# Patient Record
Sex: Male | Born: 1960 | Race: White | Hispanic: No | Marital: Married | Smoking: Former smoker
Health system: Southern US, Community
[De-identification: ages and names within clinical notes are randomized; demographics above are authoritative.]

## PROBLEM LIST (undated history)

## (undated) DIAGNOSIS — K08109 Complete loss of teeth, unspecified cause, unspecified class: Secondary | ICD-10-CM

## (undated) DIAGNOSIS — M069 Rheumatoid arthritis, unspecified: Secondary | ICD-10-CM

## (undated) DIAGNOSIS — H18519 Endothelial corneal dystrophy, unspecified eye: Secondary | ICD-10-CM

## (undated) DIAGNOSIS — I10 Essential (primary) hypertension: Secondary | ICD-10-CM

## (undated) DIAGNOSIS — H269 Unspecified cataract: Secondary | ICD-10-CM

## (undated) DIAGNOSIS — Z972 Presence of dental prosthetic device (complete) (partial): Secondary | ICD-10-CM

## (undated) DIAGNOSIS — K219 Gastro-esophageal reflux disease without esophagitis: Secondary | ICD-10-CM

## (undated) DIAGNOSIS — C801 Malignant (primary) neoplasm, unspecified: Secondary | ICD-10-CM

## (undated) DIAGNOSIS — R350 Frequency of micturition: Secondary | ICD-10-CM

---

## 2011-11-19 HISTORY — PX: CORNEAL TRANSPLANT: SHX108

## 2013-02-04 ENCOUNTER — Encounter: Payer: Self-pay | Admitting: Family Medicine

## 2013-02-04 DIAGNOSIS — E785 Hyperlipidemia, unspecified: Secondary | ICD-10-CM | POA: Insufficient documentation

## 2013-02-05 ENCOUNTER — Encounter: Payer: Self-pay | Admitting: Family Medicine

## 2013-02-05 ENCOUNTER — Ambulatory Visit (INDEPENDENT_AMBULATORY_CARE_PROVIDER_SITE_OTHER): Payer: BC Managed Care – PPO | Admitting: Family Medicine

## 2013-02-05 VITALS — BP 136/100 | HR 80 | Ht 71.0 in | Wt 231.5 lb

## 2013-02-05 DIAGNOSIS — E785 Hyperlipidemia, unspecified: Secondary | ICD-10-CM

## 2013-02-05 DIAGNOSIS — I10 Essential (primary) hypertension: Secondary | ICD-10-CM | POA: Insufficient documentation

## 2013-02-05 LAB — BASIC METABOLIC PANEL
BUN: 17 mg/dL (ref 6–23)
CO2: 29 mEq/L (ref 19–32)
Chloride: 106 mEq/L (ref 96–112)
Glucose, Bld: 95 mg/dL (ref 70–99)
Potassium: 4.5 mEq/L (ref 3.5–5.3)

## 2013-02-05 LAB — HEPATIC FUNCTION PANEL
AST: 18 U/L (ref 0–37)
Albumin: 4.2 g/dL (ref 3.5–5.2)
Total Bilirubin: 0.8 mg/dL (ref 0.3–1.2)
Total Protein: 6.9 g/dL (ref 6.0–8.3)

## 2013-02-05 LAB — LIPID PANEL
Cholesterol: 145 mg/dL (ref 0–200)
Total CHOL/HDL Ratio: 4.1 Ratio

## 2013-02-05 MED ORDER — LOSARTAN POTASSIUM 100 MG PO TABS: 100.0000 mg | ORAL_TABLET | Freq: Every day | ORAL | Status: DC

## 2013-02-05 MED ORDER — PRAVASTATIN SODIUM 40 MG PO TABS: 40.0000 mg | ORAL_TABLET | Freq: Every day | ORAL | Status: DC

## 2013-02-05 NOTE — Patient Instructions (Signed)
Take all meds. Please exercise regularly.

## 2013-02-05 NOTE — Progress Notes (Signed)
  Subjective:    Patient ID: FIRAS GUARDADO, male    DOB: 09/16/1961, 52 y.o.   MRN: [id number]  Hypertension This is a chronic problem. The current episode started more than 1 year ago. The problem has been gradually worsening since onset. Associated symptoms include malaise/fatigue. Risk factors for coronary artery disease include dyslipidemia. Past treatments include angiotensin blockers. The current treatment provides mild improvement. Compliance problems include diet and exercise.  There is no history of angina or CVA. There is no history of chronic renal disease.      Review of Systems  Constitutional: Positive for malaise/fatigue.  All other systems reviewed and are negative.       Objective:   Physical Exam  Constitutional: He appears well-developed.  HENT:  Head: Normocephalic.  Eyes: EOM are normal.  Neck: Normal range of motion. Neck supple.  Cardiovascular: Normal rate and regular rhythm.   Pulmonary/Chest: Effort normal and breath sounds normal.  Abdominal: Soft. Bowel sounds are normal.          Assessment & Plan:  Impression #1 hyperlipidemia. Control uncertain. #2 hypertension good control. Plan maintain meds. Diet exercise discussed. Return for physical in 3 months.

## 2013-02-26 ENCOUNTER — Encounter: Payer: Self-pay | Admitting: Family Medicine

## 2013-06-02 ENCOUNTER — Telehealth: Payer: Self-pay | Admitting: Family Medicine

## 2013-06-02 NOTE — Telephone Encounter (Signed)
Please fill out form attached to chart and fax to [phone]

## 2013-06-04 NOTE — Telephone Encounter (Signed)
Please call [phone] when the form is completed and faxed to Employer.  This form has an effect on the [employer] at his work.  Thanks

## 2013-06-18 NOTE — Telephone Encounter (Signed)
Chart still has no form, call pt, need form again, also in march I recommended a PE in three mo.s remind pt of this

## 2013-06-18 NOTE — Telephone Encounter (Addendum)
Chart was sent back on 06/02/13 with form attached

## 2013-06-18 NOTE — Telephone Encounter (Signed)
No form on chart

## 2013-06-21 NOTE — Telephone Encounter (Signed)
Patient states that this has already been done and he has confirmation of this.

## 2013-11-16 ENCOUNTER — Other Ambulatory Visit: Payer: Self-pay | Admitting: *Deleted

## 2013-11-16 MED ORDER — LOSARTAN POTASSIUM 100 MG PO TABS: 100.0000 mg | ORAL_TABLET | Freq: Every day | ORAL | Status: DC

## 2013-11-16 MED ORDER — PRAVASTATIN SODIUM 40 MG PO TABS: 40.0000 mg | ORAL_TABLET | Freq: Every day | ORAL | Status: DC

## 2013-11-17 ENCOUNTER — Other Ambulatory Visit: Payer: Self-pay | Admitting: *Deleted

## 2013-12-08 ENCOUNTER — Ambulatory Visit (INDEPENDENT_AMBULATORY_CARE_PROVIDER_SITE_OTHER): Payer: BC Managed Care – PPO | Admitting: Family Medicine

## 2013-12-08 ENCOUNTER — Encounter: Payer: Self-pay | Admitting: Family Medicine

## 2013-12-08 VITALS — BP 142/96 | Temp 98.2°F | Ht 71.0 in | Wt 234.0 lb

## 2013-12-08 DIAGNOSIS — L02419 Cutaneous abscess of limb, unspecified: Secondary | ICD-10-CM

## 2013-12-08 DIAGNOSIS — L03119 Cellulitis of unspecified part of limb: Principal | ICD-10-CM

## 2013-12-08 MED ORDER — DOXYCYCLINE HYCLATE 100 MG PO TABS: 100.0000 mg | ORAL_TABLET | Freq: Two times a day (BID) | ORAL | Status: DC

## 2013-12-08 NOTE — Progress Notes (Signed)
   Subjective:    Patient ID: Barry Gibson, male    DOB: [birth year], 53 y.o.   MRN: [id number]  HPIBoil on left leg. Started 1 week ago. It is draining.   This is been progressively getting larger or more painful tender. He denies having this before. Denies any fever chills wheezing difficulty breathing or other issues  Review of Systems     Objective:   Physical Exam On on examination he has a large boil on the left need redness tenderness swelling pain discomfort. It does drain little bit of pus there is some crusting  After verbal consent the patient consented to go ahead and have treatment. I used lidocaine 1% and numb and then using #11 blade to make a small cut into it got adequate drainage then placed a small packing. There was already tissue destruction of this area do to the infection. A culture was taken. It went without difficulty.       Assessment & Plan:  #1 abscess on the left knee-I. do not feel it's in the joint. It was numbed with 1% lidocaine. #11 blade used to open it up. Significant amount of pus and blood was obtained. Placed on antibiotics twice daily doxycycline twice a day 10 days warning signs discussed if progressive troubles followup. Culture taken. Packing put in. He will take it out tomorrow she is unable to do so he will followup with [place]. Warning signs were discussed.

## 2013-12-11 LAB — WOUND CULTURE

## 2014-02-11 ENCOUNTER — Ambulatory Visit: Payer: BC Managed Care – PPO | Admitting: Family Medicine

## 2014-02-16 ENCOUNTER — Encounter: Payer: Self-pay | Admitting: Family Medicine

## 2014-02-16 ENCOUNTER — Ambulatory Visit (INDEPENDENT_AMBULATORY_CARE_PROVIDER_SITE_OTHER): Payer: BC Managed Care – PPO | Admitting: Family Medicine

## 2014-02-16 VITALS — BP 128/92 | Ht 71.0 in | Wt 235.0 lb

## 2014-02-16 DIAGNOSIS — Z125 Encounter for screening for malignant neoplasm of prostate: Secondary | ICD-10-CM

## 2014-02-16 DIAGNOSIS — E785 Hyperlipidemia, unspecified: Secondary | ICD-10-CM

## 2014-02-16 DIAGNOSIS — Z79899 Other long term (current) drug therapy: Secondary | ICD-10-CM

## 2014-02-16 LAB — HEPATIC FUNCTION PANEL
ALK PHOS: 68 U/L (ref 39–117)
ALT: 15 U/L (ref 0–53)
AST: 13 U/L (ref 0–37)
Albumin: 3.9 g/dL (ref 3.5–5.2)
BILIRUBIN TOTAL: 0.8 mg/dL (ref 0.2–1.2)
Bilirubin, Direct: 0.2 mg/dL (ref 0.0–0.3)
Indirect Bilirubin: 0.6 mg/dL (ref 0.2–1.2)
TOTAL PROTEIN: 6.6 g/dL (ref 6.0–8.3)

## 2014-02-16 LAB — BASIC METABOLIC PANEL
BUN: 17 mg/dL (ref 6–23)
CO2: 27 mEq/L (ref 19–32)
CREATININE: 1.04 mg/dL (ref 0.50–1.35)
Calcium: 8.9 mg/dL (ref 8.4–10.5)
Chloride: 104 mEq/L (ref 96–112)
Glucose, Bld: 95 mg/dL (ref 70–99)
Potassium: 4 mEq/L (ref 3.5–5.3)
Sodium: 140 mEq/L (ref 135–145)

## 2014-02-16 LAB — LIPID PANEL
Cholesterol: 149 mg/dL (ref 0–200)
HDL: 36 mg/dL — AB (ref >39)
LDL Cholesterol: 96 mg/dL (ref 0–99)
TRIGLYCERIDES: 84 mg/dL (ref <150)
Total CHOL/HDL Ratio: 4.1 Ratio
VLDL: 17 mg/dL (ref 0–40)

## 2014-02-16 MED ORDER — LOSARTAN POTASSIUM 100 MG PO TABS: 100.0000 mg | ORAL_TABLET | Freq: Every day | ORAL | Status: DC

## 2014-02-16 MED ORDER — PRAVASTATIN SODIUM 40 MG PO TABS: 40.0000 mg | ORAL_TABLET | Freq: Every day | ORAL | Status: DC

## 2014-02-16 NOTE — Progress Notes (Signed)
   Subjective:    Patient ID: Barry Gibson, male    DOB: [birth year], 53 y.o.   MRN: [id number]  HPIMed check up.Patient states no concerns today.   A lot of walking at work  Patient claims compliance with medications.  No obvious side effects from the medicine.  Mostly watch his diet though he knows there is room for improvement.   Review of Systems No headache no chest pain back pain no abdominal pain no change in bowel habits ROS otherwise negative    Objective:   Physical Exam Alert no apparent distress vitals stable. Lungs clear. Heart regular in rhythm.       Assessment & Plan:  Impression 1 hypertension good control. #2 hyperlipidemia status uncertain. Plan diet exercise discussed maintain same medications. Walking is exam encourage. WSL

## 2014-02-17 LAB — PSA: PSA: 0.68 ng/mL (ref <=4.00)

## 2014-02-27 ENCOUNTER — Encounter: Payer: Self-pay | Admitting: Family Medicine

## 2014-05-17 ENCOUNTER — Telehealth: Payer: Self-pay | Admitting: Family Medicine

## 2014-05-17 MED ORDER — LOSARTAN POTASSIUM 100 MG PO TABS
100.0000 mg | ORAL_TABLET | Freq: Every day | ORAL | Status: DC
Start: 2014-05-17 — End: 2014-08-17

## 2014-05-17 MED ORDER — PRAVASTATIN SODIUM 40 MG PO TABS: 40.0000 mg | ORAL_TABLET | Freq: Every day | ORAL | Status: DC

## 2014-05-17 NOTE — Telephone Encounter (Signed)
Patient needs refills of losartan (COZAAR) 100 MG tablet and pravastatin (PRAVACHOL) 40 MG tablet. Please call patients wife when this is done.  [place]

## 2014-05-17 NOTE — Telephone Encounter (Signed)
Rxs sent electronically to pharmacy. Patient notified. 

## 2014-05-19 ENCOUNTER — Encounter: Payer: BC Managed Care – PPO | Admitting: Family Medicine

## 2014-08-17 ENCOUNTER — Telehealth: Payer: Self-pay | Admitting: Family Medicine

## 2014-08-17 MED ORDER — PRAVASTATIN SODIUM 40 MG PO TABS: 40.0000 mg | ORAL_TABLET | Freq: Every day | ORAL | Status: DC

## 2014-08-17 MED ORDER — LOSARTAN POTASSIUM 100 MG PO TABS: 100.0000 mg | ORAL_TABLET | Freq: Every day | ORAL | Status: DC

## 2014-08-17 NOTE — Telephone Encounter (Signed)
Patients wife said that in the past we have called patients losartan (COZAAR) 100 MG tablet and pravastatin (PRAVACHOL) 40 MG tablet in for a year supply. She wants to know why we did not do this for him the last time he was in the office, because he currently has no more refills?  [place]

## 2014-08-17 NOTE — Telephone Encounter (Signed)
Notified wife that at patient's last office visit in April, the doctor wanted patient to return in 3 months (July 2015) for a wellness exam and he never did. Wife stated that she will have patient call and schedule wellness exam asap. Sent in 90 day supply of meds to mail order pharmacy based on appt being made.

## 2014-11-24 ENCOUNTER — Telehealth: Payer: Self-pay | Admitting: Family Medicine

## 2014-11-24 DIAGNOSIS — Z125 Encounter for screening for malignant neoplasm of prostate: Secondary | ICD-10-CM

## 2014-11-24 DIAGNOSIS — Z79899 Other long term (current) drug therapy: Secondary | ICD-10-CM

## 2014-11-24 DIAGNOSIS — I10 Essential (primary) hypertension: Secondary | ICD-10-CM

## 2014-11-24 DIAGNOSIS — E785 Hyperlipidemia, unspecified: Secondary | ICD-10-CM

## 2014-11-24 MED ORDER — LOSARTAN POTASSIUM 100 MG PO TABS: 100.0000 mg | ORAL_TABLET | Freq: Every day | ORAL | Status: DC

## 2014-11-24 MED ORDER — PRAVASTATIN SODIUM 40 MG PO TABS: 40.0000 mg | ORAL_TABLET | Freq: Every day | ORAL | Status: DC

## 2014-11-24 NOTE — Telephone Encounter (Signed)
Pt was advised in April last yr to f u in three mo, waited nine mo, met 7 lip liv psa. We won't do 90 d before seeing. thierty d if family request of both locally

## 2014-11-24 NOTE — Telephone Encounter (Signed)
Patient's wife notified. I sent in a 30 day supply and ordered BW. She verbalized understanding.

## 2014-11-24 NOTE — Telephone Encounter (Signed)
Patient has a wellness visit on 12/01/2013. Patients spouse is concerned that he will be out of his medication before they are able to get this back from the mail order company [facility] that they use if he waits until this appointment to get his refills.  She says this is her dilemma and she does not know what to do.  Also, is he due for BW for this visit?

## 2014-11-25 LAB — BASIC METABOLIC PANEL
BUN: 20 mg/dL (ref 6–23)
CO2: 27 meq/L (ref 19–32)
Calcium: 8.8 mg/dL (ref 8.4–10.5)
Chloride: 106 mEq/L (ref 96–112)
Creat: 1.05 mg/dL (ref 0.50–1.35)
Glucose, Bld: 91 mg/dL (ref 70–99)
POTASSIUM: 4.4 meq/L (ref 3.5–5.3)
Sodium: 140 mEq/L (ref 135–145)

## 2014-11-25 LAB — LIPID PANEL
CHOL/HDL RATIO: 4.5 ratio
CHOLESTEROL: 148 mg/dL (ref 0–200)
HDL: 33 mg/dL — ABNORMAL LOW (ref >39)
LDL Cholesterol: 101 mg/dL — ABNORMAL HIGH (ref 0–99)
Triglycerides: 70 mg/dL (ref <150)
VLDL: 14 mg/dL (ref 0–40)

## 2014-11-25 LAB — HEPATIC FUNCTION PANEL
ALBUMIN: 3.9 g/dL (ref 3.5–5.2)
ALT: 31 U/L (ref 0–53)
AST: 21 U/L (ref 0–37)
Alkaline Phosphatase: 63 U/L (ref 39–117)
BILIRUBIN DIRECT: 0.1 mg/dL (ref 0.0–0.3)
BILIRUBIN TOTAL: 0.6 mg/dL (ref 0.2–1.2)
Indirect Bilirubin: 0.5 mg/dL (ref 0.2–1.2)
Total Protein: 6.6 g/dL (ref 6.0–8.3)

## 2014-11-26 LAB — PSA: PSA: 0.53 ng/mL (ref <=4.00)

## 2014-11-28 DIAGNOSIS — I10 Essential (primary) hypertension: Secondary | ICD-10-CM

## 2014-12-01 ENCOUNTER — Encounter: Payer: Self-pay | Admitting: Family Medicine

## 2014-12-01 ENCOUNTER — Ambulatory Visit (INDEPENDENT_AMBULATORY_CARE_PROVIDER_SITE_OTHER): Payer: BLUE CROSS/BLUE SHIELD | Admitting: Family Medicine

## 2014-12-01 VITALS — BP 150/100 | Temp 97.8°F | Ht 71.0 in | Wt 244.0 lb

## 2014-12-01 DIAGNOSIS — E785 Hyperlipidemia, unspecified: Secondary | ICD-10-CM

## 2014-12-01 DIAGNOSIS — I10 Essential (primary) hypertension: Secondary | ICD-10-CM

## 2014-12-01 DIAGNOSIS — Z0000 Encounter for general adult medical examination without abnormal findings: Secondary | ICD-10-CM

## 2014-12-01 MED ORDER — LOSARTAN POTASSIUM 100 MG PO TABS: 100.0000 mg | ORAL_TABLET | Freq: Every day | ORAL | Status: DC

## 2014-12-01 MED ORDER — PRAVASTATIN SODIUM 40 MG PO TABS: 40.0000 mg | ORAL_TABLET | Freq: Every day | ORAL | Status: DC

## 2014-12-01 NOTE — Progress Notes (Signed)
Subjective:    Patient ID: Barry Gibson, male    DOB: [birth year], 54 y.o.   MRN: [id number]  HPI The patient comes in today for a wellness visit.    A review of their health history was completed.  A review of medications was also completed.  Any needed refills; YES  Eating habits: SAME  Falls/  MVA accidents in past few months:NO Regular exercise:   BP tends to run a bit high usually 130s over 92  Pt has not had colonoscopy yet  Specialist pt sees on regular basis: [facility]RIST  Preventative health issues were discussed.   Additional concerns: NONE  Results for orders placed or performed in visit on 62/95/28  Basic metabolic panel  Result Value Ref Range   Sodium 140 135 - 145 mEq/L   Potassium 4.4 3.5 - 5.3 mEq/L   Chloride 106 96 - 112 mEq/L   CO2 27 19 - 32 mEq/L   Glucose, Bld 91 70 - 99 mg/dL   BUN 20 6 - 23 mg/dL   Creat 1.05 0.50 - 1.35 mg/dL   Calcium 8.8 8.4 - 10.5 mg/dL  Lipid panel  Result Value Ref Range   Cholesterol 148 0 - 200 mg/dL   Triglycerides 70 <150 mg/dL   HDL 33 (L) >39 mg/dL   Total CHOL/HDL Ratio 4.5 Ratio   VLDL 14 0 - 40 mg/dL   LDL Cholesterol 101 (H) 0 - 99 mg/dL  Hepatic function panel  Result Value Ref Range   Total Bilirubin 0.6 0.2 - 1.2 mg/dL   Bilirubin, Direct 0.1 0.0 - 0.3 mg/dL   Indirect Bilirubin 0.5 0.2 - 1.2 mg/dL   Alkaline Phosphatase 63 39 - 117 U/L   AST 21 0 - 37 U/L   ALT 31 0 - 53 U/L   Total Protein 6.6 6.0 - 8.3 g/dL   Albumin 3.9 3.5 - 5.2 g/dL  PSA  Result Value Ref Range   PSA 0.53 <=4.00 ng/mL   Pt was exercising regularly and then developed significant heel pain.  This is compromises exercise Review of Systems  Constitutional: Negative for fever, activity change and appetite change.       Substantial heel pain bilateral followed by specialist  HENT: Negative for congestion and rhinorrhea.   Eyes: Negative for discharge.  Respiratory: Negative for cough and wheezing.   Cardiovascular:  Negative for chest pain.  Gastrointestinal: Negative for vomiting, abdominal pain and blood in stool.  Genitourinary: Negative for frequency and difficulty urinating.  Musculoskeletal: Negative for neck pain.  Skin: Negative for rash.  Allergic/Immunologic: Negative for environmental allergies and food allergies.  Neurological: Negative for weakness and headaches.  Psychiatric/Behavioral: Negative for agitation.  All other systems reviewed and are negative.      Objective:   Physical Exam  Constitutional: He appears well-developed and well-nourished.  Blood pressure good on repeat  HENT:  Head: Normocephalic and atraumatic.  Right Ear: External ear normal.  Left Ear: External ear normal.  Nose: Nose normal.  Mouth/Throat: Oropharynx is clear and moist.  Eyes: EOM are normal. Pupils are equal, round, and reactive to light.  Neck: Normal range of motion. Neck supple. No thyromegaly present.  Cardiovascular: Normal rate, regular rhythm and normal heart sounds.   No murmur heard. Pulmonary/Chest: Effort normal and breath sounds normal. No respiratory distress. He has no wheezes.  Abdominal: Soft. Bowel sounds are normal. He exhibits no distension and no mass. There is no tenderness.  Genitourinary: Prostate normal and  penis normal.  Musculoskeletal: Normal range of motion. He exhibits no edema.  Lymphadenopathy:    He has no cervical adenopathy.  Neurological: He is alert. He exhibits normal muscle tone.  Skin: Skin is warm and dry. No erythema.  Psychiatric: He has a normal mood and affect. His behavior is normal. Judgment normal.  Vitals reviewed.         Assessment & Plan:  Impression 1 wellness exam #2 hypertension good control. #3 hyperlipidemia good control plan medications refilled. Recheck in 6 months. Diet discussed exercise discussed. Strongly encouraged to get colonoscopy and sheet given. WSL

## 2015-06-05 ENCOUNTER — Other Ambulatory Visit: Payer: Self-pay | Admitting: *Deleted

## 2015-06-05 MED ORDER — LOSARTAN POTASSIUM 100 MG PO TABS
100.0000 mg | ORAL_TABLET | Freq: Every day | ORAL | Status: DC
Start: 2015-06-05 — End: 2015-07-29

## 2015-06-05 MED ORDER — PRAVASTATIN SODIUM 40 MG PO TABS: 40.0000 mg | ORAL_TABLET | Freq: Every day | ORAL | Status: DC

## 2015-07-29 ENCOUNTER — Other Ambulatory Visit: Payer: Self-pay | Admitting: Family Medicine

## 2015-09-29 ENCOUNTER — Other Ambulatory Visit: Payer: Self-pay | Admitting: Family Medicine

## 2015-10-02 ENCOUNTER — Telehealth: Payer: Self-pay | Admitting: Family Medicine

## 2015-10-02 NOTE — Telephone Encounter (Signed)
Patient has about 30 days of his losartan and pravastatin left.  His spouse tried to call and make an appointment for him to follow up on his medications but we are booked for the month of November.  She called back after speaking with her husband and she says his physical would be due after December 02, 2015.  She wants to know if Dr. Richardson Landry could send him in additional refills on his medication to get him through until he is due for his physical in January.

## 2015-10-02 NOTE — Telephone Encounter (Signed)
ok 

## 2015-10-02 NOTE — Telephone Encounter (Signed)
May we send in refills until January. Patient last seen January 2016 for physical?

## 2015-10-03 ENCOUNTER — Other Ambulatory Visit: Payer: Self-pay | Admitting: *Deleted

## 2015-10-03 MED ORDER — PRAVASTATIN SODIUM 40 MG PO TABS: ORAL_TABLET | ORAL | Status: DC

## 2015-10-03 MED ORDER — LOSARTAN POTASSIUM 100 MG PO TABS: ORAL_TABLET | ORAL | Status: DC

## 2015-10-03 NOTE — Telephone Encounter (Signed)
Patients spouse called back and wants to make sure that the medication was sent in for a 90 day supply so that they would send the whole 90 day instead of 30 days now, with 2 refills.  She said it is cheaper this way.

## 2015-10-03 NOTE — Telephone Encounter (Signed)
meds sent for 90 days. Pt notified on vm.

## 2015-10-03 NOTE — Telephone Encounter (Signed)
Called patient's spouse and informed her per Dr.Steve Luking refills on the pravastatin, and Losartan were sent into pharmacy with a supply to last until January. Patient's wife verbalized understanding and stated that she will call back and schedule an appointment for the physical.

## 2015-12-04 ENCOUNTER — Ambulatory Visit (INDEPENDENT_AMBULATORY_CARE_PROVIDER_SITE_OTHER): Payer: BLUE CROSS/BLUE SHIELD | Admitting: Family Medicine

## 2015-12-04 ENCOUNTER — Encounter: Payer: Self-pay | Admitting: Family Medicine

## 2015-12-04 VITALS — BP 148/98 | Ht 70.25 in | Wt 242.0 lb

## 2015-12-04 DIAGNOSIS — E785 Hyperlipidemia, unspecified: Secondary | ICD-10-CM | POA: Diagnosis not present

## 2015-12-04 DIAGNOSIS — Z125 Encounter for screening for malignant neoplasm of prostate: Secondary | ICD-10-CM

## 2015-12-04 DIAGNOSIS — Z0000 Encounter for general adult medical examination without abnormal findings: Secondary | ICD-10-CM | POA: Diagnosis not present

## 2015-12-04 DIAGNOSIS — Z79899 Other long term (current) drug therapy: Secondary | ICD-10-CM

## 2015-12-04 DIAGNOSIS — I10 Essential (primary) hypertension: Secondary | ICD-10-CM

## 2015-12-04 MED ORDER — LOSARTAN POTASSIUM 100 MG PO TABS: ORAL_TABLET | ORAL | Status: DC

## 2015-12-04 MED ORDER — PRAVASTATIN SODIUM 40 MG PO TABS: ORAL_TABLET | ORAL | Status: DC

## 2015-12-04 NOTE — Progress Notes (Signed)
Subjective:    Patient ID: Barry Gibson, male    DOB: 1961/09/27, 55 y.o.   MRN: [id number]  HPI The patient comes in today for a wellness visit.  Has not had colonoscopy  A review of their health history was completed.  A review of medications was also completed.  Any needed refills; none  Eating habits: not health conscious  Falls/  MVA accidents in past few months: none  Regular exercise: tries to ride a bike daily, three times per wk mosly, 20 to 25  Specialist pt sees on regular basis: none  Preventative health issues were discussed.   Additional concerns: wants to discuss changing pravastatin. Cost went up. Compliant with cholesterol medication. Meds reviewed today. Does not miss a dose. Watching salt intake. Does not miss a blood pressure no seizure. No obvious side effects.   Ears have been stopped up for the past 2 -3 weeks. Better the past 2 days. , ears started stopping up, cleared up mostly  Results for orders placed or performed in visit on 0000000  Basic metabolic panel  Result Value Ref Range   Sodium 140 135 - 145 mEq/L   Potassium 4.4 3.5 - 5.3 mEq/L   Chloride 106 96 - 112 mEq/L   CO2 27 19 - 32 mEq/L   Glucose, Bld 91 70 - 99 mg/dL   BUN 20 6 - 23 mg/dL   Creat 1.05 0.50 - 1.35 mg/dL   Calcium 8.8 8.4 - 10.5 mg/dL  Lipid panel  Result Value Ref Range   Cholesterol 148 0 - 200 mg/dL   Triglycerides 70 <150 mg/dL   HDL 33 (L) >39 mg/dL   Total CHOL/HDL Ratio 4.5 Ratio   VLDL 14 0 - 40 mg/dL   LDL Cholesterol 101 (H) 0 - 99 mg/dL  Hepatic function panel  Result Value Ref Range   Total Bilirubin 0.6 0.2 - 1.2 mg/dL   Bilirubin, Direct 0.1 0.0 - 0.3 mg/dL   Indirect Bilirubin 0.5 0.2 - 1.2 mg/dL   Alkaline Phosphatase 63 39 - 117 U/L   AST 21 0 - 37 U/L   ALT 31 0 - 53 U/L   Total Protein 6.6 6.0 - 8.3 g/dL   Albumin 3.9 3.5 - 5.2 g/dL  PSA  Result Value Ref Range   PSA 0.53 <=4.00 ng/mL     Review of Systems  Constitutional:  Negative for fever, activity change and appetite change.  HENT: Negative for congestion and rhinorrhea.   Eyes: Negative for discharge.  Respiratory: Negative for cough and wheezing.   Cardiovascular: Negative for chest pain.  Gastrointestinal: Negative for vomiting, abdominal pain and blood in stool.  Genitourinary: Negative for frequency and difficulty urinating.  Musculoskeletal: Negative for neck pain.  Skin: Negative for rash.  Allergic/Immunologic: Negative for environmental allergies and food allergies.  Neurological: Negative for weakness and headaches.  Psychiatric/Behavioral: Negative for agitation.  All other systems reviewed and are negative.      Objective:   Physical Exam  Constitutional: He appears well-developed and well-nourished.  HENT:  Head: Normocephalic and atraumatic.  Right Ear: External ear normal.  Left Ear: External ear normal.  Nose: Nose normal.  Mouth/Throat: Oropharynx is clear and moist.  Eyes: EOM are normal. Pupils are equal, round, and reactive to light.  Neck: Normal range of motion. Neck supple. No thyromegaly present.  Cardiovascular: Normal rate, regular rhythm and normal heart sounds.   No murmur heard. Pulmonary/Chest: Effort normal and breath sounds normal. No  respiratory distress. He has no wheezes.  Abdominal: Soft. Bowel sounds are normal. He exhibits no distension and no mass. There is no tenderness.  Genitourinary: Penis normal.  Musculoskeletal: Normal range of motion. He exhibits no edema.  Lymphadenopathy:    He has no cervical adenopathy.  Neurological: He is alert. He exhibits normal muscle tone.  Skin: Skin is warm and dry. No erythema.  Psychiatric: He has a normal mood and affect. His behavior is normal. Judgment normal.  Vitals reviewed.   Ear exam does reveal substantial wax bilateral      Assessment & Plan:  Impression wellness exam #2 hypertension good control on current meds. Meds reviewed #3 hyperlipidemia  control uncertain needs to do blood work #4 cerumen impaction discussed plan name of ENT given. Colonoscopy sheet given. Encouraged patient to do. Appropriate blood work. Medications refilled. Diet exercise discussed and encouraged WSL

## 2015-12-05 ENCOUNTER — Encounter: Payer: Self-pay | Admitting: Family Medicine

## 2015-12-05 LAB — BASIC METABOLIC PANEL
BUN/Creatinine Ratio: 19 (ref 9–20)
BUN: 20 mg/dL (ref 6–24)
CALCIUM: 9.4 mg/dL (ref 8.7–10.2)
CHLORIDE: 104 mmol/L (ref 96–106)
CO2: 22 mmol/L (ref 18–29)
Creatinine, Ser: 1.04 mg/dL (ref 0.76–1.27)
GFR calc Af Amer: 94 mL/min/{1.73_m2} (ref >59)
GFR calc non Af Amer: 82 mL/min/{1.73_m2} (ref >59)
Glucose: 99 mg/dL (ref 65–99)
POTASSIUM: 5.1 mmol/L (ref 3.5–5.2)
Sodium: 144 mmol/L (ref 134–144)

## 2015-12-05 LAB — PSA: Prostate Specific Ag, Serum: 0.6 ng/mL (ref 0.0–4.0)

## 2015-12-05 LAB — HEPATIC FUNCTION PANEL
ALBUMIN: 4.2 g/dL (ref 3.5–5.5)
ALT: 18 IU/L (ref 0–44)
AST: 18 IU/L (ref 0–40)
Alkaline Phosphatase: 72 IU/L (ref 39–117)
Bilirubin Total: 0.8 mg/dL (ref 0.0–1.2)
Bilirubin, Direct: 0.16 mg/dL (ref 0.00–0.40)
Total Protein: 6.9 g/dL (ref 6.0–8.5)

## 2015-12-05 LAB — LIPID PANEL
CHOL/HDL RATIO: 4.4 ratio (ref 0.0–5.0)
CHOLESTEROL TOTAL: 163 mg/dL (ref 100–199)
HDL: 37 mg/dL — ABNORMAL LOW (ref >39)
LDL CALC: 106 mg/dL — AB (ref 0–99)
TRIGLYCERIDES: 98 mg/dL (ref 0–149)
VLDL CHOLESTEROL CAL: 20 mg/dL (ref 5–40)

## 2015-12-21 ENCOUNTER — Emergency Department (HOSPITAL_COMMUNITY): Payer: Worker's Compensation

## 2015-12-21 ENCOUNTER — Encounter (HOSPITAL_COMMUNITY): Payer: Self-pay | Admitting: Emergency Medicine

## 2015-12-21 ENCOUNTER — Emergency Department (HOSPITAL_COMMUNITY)
Admission: EM | Admit: 2015-12-21 | Discharge: 2015-12-21 | Disposition: A | Discharge status: Home / Self Care | Payer: Worker's Compensation | Attending: Emergency Medicine | Admitting: Emergency Medicine

## 2015-12-21 DIAGNOSIS — I10 Essential (primary) hypertension: Secondary | ICD-10-CM | POA: Insufficient documentation

## 2015-12-21 DIAGNOSIS — Y990 Civilian activity done for income or pay: Secondary | ICD-10-CM | POA: Insufficient documentation

## 2015-12-21 DIAGNOSIS — Z87891 Personal history of nicotine dependence: Secondary | ICD-10-CM | POA: Insufficient documentation

## 2015-12-21 DIAGNOSIS — W1843XA Slipping, tripping and stumbling without falling due to stepping from one level to another, initial encounter: Secondary | ICD-10-CM | POA: Diagnosis not present

## 2015-12-21 DIAGNOSIS — Y9389 Activity, other specified: Secondary | ICD-10-CM | POA: Diagnosis not present

## 2015-12-21 DIAGNOSIS — S8991XA Unspecified injury of right lower leg, initial encounter: Secondary | ICD-10-CM | POA: Diagnosis not present

## 2015-12-21 DIAGNOSIS — Z79899 Other long term (current) drug therapy: Secondary | ICD-10-CM | POA: Diagnosis not present

## 2015-12-21 DIAGNOSIS — Z7982 Long term (current) use of aspirin: Secondary | ICD-10-CM | POA: Insufficient documentation

## 2015-12-21 DIAGNOSIS — M25561 Pain in right knee: Secondary | ICD-10-CM

## 2015-12-21 DIAGNOSIS — Y9289 Other specified places as the place of occurrence of the external cause: Secondary | ICD-10-CM | POA: Diagnosis not present

## 2015-12-21 HISTORY — DX: Essential (primary) hypertension: I10

## 2015-12-21 LAB — RAPID URINE DRUG SCREEN, HOSP PERFORMED
Amphetamines: NOT DETECTED
BARBITURATES: NOT DETECTED
Benzodiazepines: NOT DETECTED
Cocaine: NOT DETECTED
Opiates: NOT DETECTED
TETRAHYDROCANNABINOL: NOT DETECTED

## 2015-12-21 MED ORDER — OXYCODONE-ACETAMINOPHEN 5-325 MG PO TABS
1.0000 | ORAL_TABLET | Freq: Once | ORAL | Status: AC
  Administered 2015-12-21: 1 via ORAL
  Filled 2015-12-21: qty 1

## 2015-12-21 MED ORDER — DICLOFENAC SODIUM 50 MG PO TBEC: 50.0000 mg | DELAYED_RELEASE_TABLET | Freq: Two times a day (BID) | ORAL | Status: DC

## 2015-12-21 MED ORDER — OXYCODONE-ACETAMINOPHEN 5-325 MG PO TABS: 1.0000 | ORAL_TABLET | ORAL | Status: DC | PRN

## 2015-12-21 NOTE — ED Notes (Signed)
Pt and pt's wife requesting urine drug screen because the incident happened at work and he was told by his employer to have drug screen done. Pt was given Percocet approximately 1 hour and 15 mins before urine drug screen was performed. Debroah Baller, NP aware.

## 2015-12-21 NOTE — ED Provider Notes (Signed)
CSN: [id number]     Arrival date & time 12/21/15  0857 History   First MD Initiated Contact with Patient 12/21/15 [phone]     Chief Complaint  Patient presents with  . Knee Injury     (Consider location/radiation/quality/duration/timing/severity/associated sxs/prior Treatment) Patient is a 55 y.o. male presenting with knee pain. The history is provided by the patient.  Knee Pain Location:  Knee Time since incident:  24 hours Knee location:  R knee Pain details:    Quality:  Tearing   Radiates to:  R leg   Severity:  Severe   Onset quality:  Gradual   Timing:  Constant   Progression:  Worsening Dislocation: no   Foreign body present:  No foreign bodies Prior injury to area:  No Relieved by:  Nothing Worsened by:  Bearing weight Ineffective treatments:  NSAIDs Associated symptoms: swelling    LORELL SMERIGLIO is a 55 y.o. male who presents to the ED with right knee pain. 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  He reports difficulty in trying to raise his leg while lying flat due to pain. He reports taking ibuprofen for pain but was still unable to sleepy all night.   Past Medical History  Diagnosis Date  . Hypertension    History reviewed. No pertinent past surgical history. History reviewed. No pertinent family history. Social History  Substance Use Topics  . Smoking status: Former Smoker    Types: Cigarettes  . Smokeless tobacco: None  . Alcohol Use: 0.0 oz/week    Review of Systems  Musculoskeletal: Positive for arthralgias.       Right knee pain and swelling  all other systems negative    Allergies  Ace inhibitors  Home Medications   Prior to Admission medications   Medication Sig Start Date End Date Taking? Authorizing Provider  aspirin 81 MG tablet Take 81 mg by mouth daily.   Yes Historical Provider, MD  ibuprofen (ADVIL,MOTRIN) 200 MG tablet Take 600 mg by mouth every 6  (six) hours as needed.   Yes Historical Provider, MD  losartan (COZAAR) 100 MG tablet Take 1 tablet by mouth  daily 12/04/15  Yes Mikey Kirschner, MD  pravastatin (PRAVACHOL) 40 MG tablet Take 1 tablet by mouth  daily 12/04/15  Yes Mikey Kirschner, MD  prednisoLONE acetate (PRED FORTE) 1 % ophthalmic suspension Place 1 drop into the left eye 3 (three) times a week. Monday, Wednesday, Friday   Yes Historical Provider, MD  diclofenac (VOLTAREN) 50 MG EC tablet Take 1 tablet (50 mg total) by mouth 2 (two) times daily. 12/21/15   Zhavia Cunanan Bunnie Pion, NP  oxyCODONE-acetaminophen (ROXICET) 5-325 MG tablet Take 1-2 tablets by mouth every 4 (four) hours as needed for severe pain. 12/21/15   Dustina Scoggin Bunnie Pion, NP   BP 112/72 mmHg  Pulse 80  Temp(Src) 97.6 F (36.4 C) (Oral)  Resp 18  Ht 5\' 11"  (1.803 m)  Wt 107.956 kg  BMI 33.21 kg/m2  SpO2 99% Physical Exam  Constitutional: He is oriented to person, place, and time. He appears well-developed and well-nourished. No distress.  HENT:  Head: Normocephalic and atraumatic.  Eyes: Conjunctivae and EOM are normal.  Neck: Normal range of motion. Neck supple.  Cardiovascular: Normal rate.   Pulmonary/Chest: Effort normal.  Musculoskeletal:       Right knee: He exhibits swelling. He exhibits normal range of motion, no  deformity and no erythema.       Legs: Pedal pulses 2+, adequate circulation. Pain with straight leg raises on the right. Pain with palpation over the patella. Severe pain with flexion of the knee.   Neurological: He is alert and oriented to person, place, and time. No cranial nerve deficit.  Skin: Skin is warm and dry.  Psychiatric: He has a normal mood and affect. His behavior is normal.  Nursing note and vitals reviewed.   ED Course  Procedures (including critical care time) X-ray, pain management, ice, elevation, urine drug screen for worker's comp. Knee immobilizer, crutches (patient has at home). Referral to ortho.. Patient request to see  [facility].   Labs Review Labs Reviewed  URINE RAPID DRUG SCREEN, HOSP PERFORMED    Imaging Review Dg Knee Complete 4 Views Right  12/21/2015  CLINICAL DATA:  55 year old male status post twisting injury yesterday with severe pain, and swelling. Initial encounter. EXAM: RIGHT KNEE - COMPLETE 4+ VIEW COMPARISON:  None. FINDINGS: No definite joint effusion. Bone mineralization is within normal limits. Joint spaces and alignment within normal limits. Patella intact. No acute osseous abnormality identified. IMPRESSION: No osseous abnormality about the left knee. Electronically Signed   By: Genevie Ann M.D.   On: 12/21/2015 09:49    MDM  55 y.o. male with right knee pain s/p injury at work yesterday stable for d/c without focal neuro deficits. Knee immobilizer, crutches, ice, elevation and pain management. Discussed with the patient and all questioned fully answered. He will retirm if any problems arise.   Final diagnoses:  Knee pain, right anterior       [facility], NP 12/21/15 1107  Milton Ferguson, MD 12/22/15 [phone]

## 2015-12-21 NOTE — Discharge Instructions (Signed)
Call to schedule an appointment with the orthopedic doctor. Tell them it will be Workers Comp. Return here as needed.

## 2015-12-21 NOTE — ED Notes (Signed)
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

## 2015-12-21 NOTE — ED Notes (Signed)
NP at bedside.

## 2016-01-11 ENCOUNTER — Ambulatory Visit (INDEPENDENT_AMBULATORY_CARE_PROVIDER_SITE_OTHER): Payer: BLUE CROSS/BLUE SHIELD | Admitting: Otolaryngology

## 2016-01-11 DIAGNOSIS — H6123 Impacted cerumen, bilateral: Secondary | ICD-10-CM

## 2016-01-11 DIAGNOSIS — H900 Conductive hearing loss, bilateral: Secondary | ICD-10-CM

## 2016-01-25 ENCOUNTER — Ambulatory Visit (INDEPENDENT_AMBULATORY_CARE_PROVIDER_SITE_OTHER): Payer: BLUE CROSS/BLUE SHIELD | Admitting: Otolaryngology

## 2016-05-03 ENCOUNTER — Telehealth: Payer: Self-pay | Admitting: Family Medicine

## 2016-05-03 DIAGNOSIS — E785 Hyperlipidemia, unspecified: Secondary | ICD-10-CM

## 2016-05-03 DIAGNOSIS — Z79899 Other long term (current) drug therapy: Secondary | ICD-10-CM

## 2016-05-03 DIAGNOSIS — Z139 Encounter for screening, unspecified: Secondary | ICD-10-CM

## 2016-05-03 NOTE — Telephone Encounter (Signed)
[facility] 05/03/16

## 2016-05-03 NOTE — Telephone Encounter (Signed)
Labs please, has appt Tuesday morning He is experiencing what he thinks may be gout if you need to  Add those labs, He has also mentioned frequency with urination (no pain)   Last labs 12/04/15 Lip, Hep, BMP, PSA    Call them on [phone]

## 2016-05-03 NOTE — Telephone Encounter (Signed)
Gout has been going on for while and urinary symptoms since January. Pt is wants to discuss those issues on Tuesday. Does not feel he needs to come in sooner. Wants to do lab work tomorrow.

## 2016-05-03 NOTE — Telephone Encounter (Signed)
Lip liv uric acid

## 2016-05-04 DIAGNOSIS — E785 Hyperlipidemia, unspecified: Secondary | ICD-10-CM | POA: Diagnosis not present

## 2016-05-04 DIAGNOSIS — Z79899 Other long term (current) drug therapy: Secondary | ICD-10-CM | POA: Diagnosis not present

## 2016-05-04 DIAGNOSIS — Z139 Encounter for screening, unspecified: Secondary | ICD-10-CM | POA: Diagnosis not present

## 2016-05-05 LAB — HEPATIC FUNCTION PANEL
ALBUMIN: 4 g/dL (ref 3.5–5.5)
ALT: 15 IU/L (ref 0–44)
AST: 13 IU/L (ref 0–40)
Alkaline Phosphatase: 73 IU/L (ref 39–117)
BILIRUBIN TOTAL: 0.6 mg/dL (ref 0.0–1.2)
Bilirubin, Direct: 0.16 mg/dL (ref 0.00–0.40)
Total Protein: 6.7 g/dL (ref 6.0–8.5)

## 2016-05-05 LAB — URIC ACID: Uric Acid: 4.8 mg/dL (ref 3.7–8.6)

## 2016-05-05 LAB — LIPID PANEL
CHOLESTEROL TOTAL: 141 mg/dL (ref 100–199)
Chol/HDL Ratio: 3.9 ratio units (ref 0.0–5.0)
HDL: 36 mg/dL — ABNORMAL LOW (ref >39)
LDL CALC: 93 mg/dL (ref 0–99)
Triglycerides: 61 mg/dL (ref 0–149)
VLDL Cholesterol Cal: 12 mg/dL (ref 5–40)

## 2016-05-07 ENCOUNTER — Telehealth: Payer: Self-pay | Admitting: Family Medicine

## 2016-05-07 ENCOUNTER — Ambulatory Visit (HOSPITAL_COMMUNITY)
Admission: RE | Admit: 2016-05-07 | Discharge: 2016-05-07 | Disposition: A | Discharge status: Home / Self Care | Payer: BLUE CROSS/BLUE SHIELD | Source: Ambulatory Visit | Attending: Family Medicine | Admitting: Family Medicine

## 2016-05-07 ENCOUNTER — Ambulatory Visit (INDEPENDENT_AMBULATORY_CARE_PROVIDER_SITE_OTHER): Payer: BLUE CROSS/BLUE SHIELD | Admitting: Family Medicine

## 2016-05-07 ENCOUNTER — Encounter: Payer: Self-pay | Admitting: Family Medicine

## 2016-05-07 ENCOUNTER — Other Ambulatory Visit: Payer: Self-pay

## 2016-05-07 VITALS — BP 142/90 | Ht 70.25 in | Wt 242.0 lb

## 2016-05-07 DIAGNOSIS — M79642 Pain in left hand: Secondary | ICD-10-CM | POA: Insufficient documentation

## 2016-05-07 DIAGNOSIS — M25511 Pain in right shoulder: Secondary | ICD-10-CM | POA: Diagnosis not present

## 2016-05-07 DIAGNOSIS — I10 Essential (primary) hypertension: Secondary | ICD-10-CM | POA: Diagnosis not present

## 2016-05-07 DIAGNOSIS — E785 Hyperlipidemia, unspecified: Secondary | ICD-10-CM | POA: Diagnosis not present

## 2016-05-07 DIAGNOSIS — Q740 Other congenital malformations of upper limb(s), including shoulder girdle: Secondary | ICD-10-CM | POA: Diagnosis not present

## 2016-05-07 DIAGNOSIS — Q681 Congenital deformity of finger(s) and hand: Secondary | ICD-10-CM | POA: Diagnosis not present

## 2016-05-07 DIAGNOSIS — M19011 Primary osteoarthritis, right shoulder: Secondary | ICD-10-CM | POA: Diagnosis not present

## 2016-05-07 MED ORDER — ETODOLAC 400 MG PO TABS: ORAL_TABLET | ORAL | Status: DC

## 2016-05-07 NOTE — Telephone Encounter (Signed)
[place] work in around 4 would no do separate visit cannot do two visits one day, will add as addenmdum and procedure to this morns visit

## 2016-05-07 NOTE — Telephone Encounter (Signed)
Pt was seen earlier today and states that he was offered a shot in his shoulder. Pt didn't think he would need it but after thinking it over thinks that maybe he should have gotten it. Pt is wanting to know if he can be worked in to get this shot sometime this afternoon. He is going out of town tomorrow. Please advise.

## 2016-05-07 NOTE — Telephone Encounter (Addendum)
Patient seeing Dr Richardson Landry now for evaluation and treatment.

## 2016-05-07 NOTE — Progress Notes (Signed)
   Subjective:    Patient ID: Barry Gibson, male    DOB: 05-03-1961, 55 y.o.   MRN: [name] Patient arrives office with several distinct concerns Hypertension This is a chronic problem. The current episode started more than 1 year ago. There are no compliance problems.   Claims compliance with his blood pressure medicine. No obvious side effects. Does not miss a dose.  Compliant with lipid medicine. Does not miss dose. Work on fats in diet. Wonders if his lipid medicine may be causing some of his joint difficulties.   Patient in muscles aches in shoulders and joints. Right shoulder more than left. Worse with certain motions affecting work. Also pain in the hand. Respond somewhat ibuprofen. History of deformed left hand since birth. Relies on one finger of his left hand that is becoming more and more painful. Patient thought he may be experiencing some gout  Also has concerns of gout. Aching in sthe shoulders,right shoulder more painful,,    Results for orders placed or performed during the hospital encounter of 12/21/15  Urine rapid drug screen (hosp performed)  Result Value Ref Range   Opiates NONE DETECTED NONE DETECTED   Cocaine NONE DETECTED NONE DETECTED   Benzodiazepines NONE DETECTED NONE DETECTED   Amphetamines NONE DETECTED NONE DETECTED   Tetrahydrocannabinol NONE DETECTED NONE DETECTED   Barbiturates NONE DETECTED NONE DETECTED   Sticking with chol med faithfully b    Review of Systems No headache, no major weight loss or weight gain, no chest pain no back pain abdominal pain no change in bowel habits complete ROS otherwise negative     Objective:   Physical Exam  Alert vitals stable blood pressure still elevated on repeat not enough to warrant higher doses of meds right shoulder positive impingement sign lungs clear heart rare rhythm 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 Appropriate blood work follow up on bloodwork meds refilled diet exercise discussed recheck in 6 months. Patient advised he can hold off on lipids medicine for one month that he desires at this time he will continue

## 2016-05-08 ENCOUNTER — Telehealth: Payer: Self-pay | Admitting: Family Medicine

## 2016-05-08 NOTE — Telephone Encounter (Signed)
[facility]  To give results. Copy mailed to pt. Original scanned into chart.

## 2016-05-08 NOTE — Telephone Encounter (Signed)
Pt had labs drawn at [facility] on Saturday 05/04/2016 so that doc could go over results with him at [facility] 05/07/16 - results are not in system Wife asked if we could call [facility] to check on it   Please call wife cell# to let her know outcome  Wife cell# [phone]

## 2016-05-08 NOTE — Telephone Encounter (Signed)
Lipids overall quite good, hdl slightly low, can improve this with regular exercise overall good panel  Liver tests are perfect  Uric acid nice and low chance of gout at this level just about zero

## 2016-05-08 NOTE — Telephone Encounter (Signed)
Results are in the yellow folder in the doctor's office.

## 2016-05-09 ENCOUNTER — Telehealth: Payer: Self-pay | Admitting: Family Medicine

## 2016-05-09 NOTE — Telephone Encounter (Signed)
Patient was notified of results. Seen telephone call from yesterday.

## 2016-05-09 NOTE — Telephone Encounter (Signed)
Not sure how long the patient held before the call was sent back to the nurse

## 2016-05-09 NOTE — Telephone Encounter (Signed)
Spouse tried to call yesterday to get results and was told she could not due to  No release on file. [name] tried to call back yesterday afternoon but the spouse States he held for a great deal of time an no one answered. She say that he would like A call from [place] today on his cell [phone] to go over his results please. Advised that we would try to get back to him today as soon as we can.

## 2016-05-09 NOTE — Telephone Encounter (Signed)
Patient was notified lipids overall quite good, hdl slightly low, can improve this with regular exercise overall good panel  Liver tests are perfect  Uric acid nice and low chance of gout at this level just about zero. Patient verbalized understanding.

## 2016-05-09 NOTE — Telephone Encounter (Signed)
Patient did not hold for a long time yesterday. I clearly remember this call being sent back and the patient did not hold for even a minute. When I went to pick up the call he was not there.

## 2016-05-12 ENCOUNTER — Encounter: Payer: Self-pay | Admitting: Family Medicine

## 2016-05-22 ENCOUNTER — Telehealth: Payer: Self-pay | Admitting: Family Medicine

## 2016-05-22 MED ORDER — ETODOLAC 400 MG PO TABS: ORAL_TABLET | ORAL | Status: DC

## 2016-05-22 NOTE — Telephone Encounter (Signed)
Patient wanted to discuss medication he was put on last visit 05/07/2016.

## 2016-05-22 NOTE — Telephone Encounter (Signed)
The lodine has really helped but he has to take 2 everyday and they would like to take all the time and get a 90 day suppy

## 2016-05-22 NOTE — Telephone Encounter (Signed)
180 , one bid w food, one ref

## 2016-05-22 NOTE — Telephone Encounter (Signed)
Rx sent electronically to pharmacy. Patient notified. 

## 2016-06-14 ENCOUNTER — Telehealth: Payer: Self-pay | Admitting: Family Medicine

## 2016-06-14 DIAGNOSIS — M79643 Pain in unspecified hand: Secondary | ICD-10-CM

## 2016-06-14 DIAGNOSIS — M25511 Pain in right shoulder: Secondary | ICD-10-CM

## 2016-06-14 NOTE — Telephone Encounter (Signed)
Spoke with patient and informed him per Dr.Steve Luking- We can do this, but Dr.Steve Luking  thinks you will likely benefit more from an orthopedist than a rehumtologist, please do whatever direction they want. Patient verbalized understanding and stated he would have his wife call back to discuss which direction to go.

## 2016-06-14 NOTE — Telephone Encounter (Signed)
We can do this, but I think pt will likely benefit more from an orthopedist than a rehumtologist, plz dpo whatever direction they want

## 2016-06-14 NOTE — Telephone Encounter (Signed)
Referral to Rheumatology in [place]  For Arthritis, family tried to call themselves but they need a referral.  Doc here said he would need to see a specialist and they would like to move forward with this now   Please call them at [phone] to get it started and they will send out the form

## 2016-06-14 NOTE — Telephone Encounter (Signed)
Spouse called wanting you to know he is in a great deal of pain  Worse than when he was when he seen Dr Richardson Landry last   They are trying to be seen as soon as possible since he is trying to work  Through the pain he is in.

## 2016-06-14 NOTE — Telephone Encounter (Signed)
Patient's wife called back stating that she would like a referral to Rheumatology. Referral in system to [facility].

## 2016-06-17 ENCOUNTER — Encounter: Payer: Self-pay | Admitting: Family Medicine

## 2016-06-18 ENCOUNTER — Other Ambulatory Visit: Payer: Self-pay | Admitting: Family Medicine

## 2016-06-24 ENCOUNTER — Encounter: Payer: Self-pay | Admitting: Family Medicine

## 2016-07-02 DIAGNOSIS — M7989 Other specified soft tissue disorders: Secondary | ICD-10-CM | POA: Diagnosis not present

## 2016-07-02 DIAGNOSIS — M25521 Pain in right elbow: Secondary | ICD-10-CM | POA: Diagnosis not present

## 2016-07-02 DIAGNOSIS — M2550 Pain in unspecified joint: Secondary | ICD-10-CM | POA: Diagnosis not present

## 2016-07-02 DIAGNOSIS — R5382 Chronic fatigue, unspecified: Secondary | ICD-10-CM | POA: Diagnosis not present

## 2016-07-11 DIAGNOSIS — M2550 Pain in unspecified joint: Secondary | ICD-10-CM | POA: Diagnosis not present

## 2016-07-11 DIAGNOSIS — Z79899 Other long term (current) drug therapy: Secondary | ICD-10-CM | POA: Diagnosis not present

## 2016-07-11 DIAGNOSIS — M0579 Rheumatoid arthritis with rheumatoid factor of multiple sites without organ or systems involvement: Secondary | ICD-10-CM | POA: Diagnosis not present

## 2016-07-30 DIAGNOSIS — T1502XA Foreign body in cornea, left eye, initial encounter: Secondary | ICD-10-CM | POA: Diagnosis not present

## 2016-07-30 DIAGNOSIS — H40013 Open angle with borderline findings, low risk, bilateral: Secondary | ICD-10-CM | POA: Diagnosis not present

## 2016-07-30 DIAGNOSIS — H2513 Age-related nuclear cataract, bilateral: Secondary | ICD-10-CM | POA: Diagnosis not present

## 2016-07-30 DIAGNOSIS — H1851 Endothelial corneal dystrophy: Secondary | ICD-10-CM | POA: Diagnosis not present

## 2016-08-27 DIAGNOSIS — M2550 Pain in unspecified joint: Secondary | ICD-10-CM | POA: Diagnosis not present

## 2016-08-27 DIAGNOSIS — Z79899 Other long term (current) drug therapy: Secondary | ICD-10-CM | POA: Diagnosis not present

## 2016-08-27 DIAGNOSIS — M0579 Rheumatoid arthritis with rheumatoid factor of multiple sites without organ or systems involvement: Secondary | ICD-10-CM | POA: Diagnosis not present

## 2016-10-02 ENCOUNTER — Telehealth: Payer: Self-pay | Admitting: Family Medicine

## 2016-10-02 DIAGNOSIS — I10 Essential (primary) hypertension: Secondary | ICD-10-CM

## 2016-10-02 DIAGNOSIS — E785 Hyperlipidemia, unspecified: Secondary | ICD-10-CM

## 2016-10-02 DIAGNOSIS — Z125 Encounter for screening for malignant neoplasm of prostate: Secondary | ICD-10-CM

## 2016-10-02 NOTE — Telephone Encounter (Signed)
Discussed with pt

## 2016-10-02 NOTE — Telephone Encounter (Signed)
Lip liv m7 psa 

## 2016-10-02 NOTE — Telephone Encounter (Signed)
Pt is needing lab orders sent over for an upcoming appt. Last labs per epic were: uric acid,lipid,and hepatic on 05/04/16

## 2016-10-02 NOTE — Telephone Encounter (Signed)
Left message return call 10/02/16

## 2016-10-22 DIAGNOSIS — Z79899 Other long term (current) drug therapy: Secondary | ICD-10-CM | POA: Diagnosis not present

## 2016-10-22 DIAGNOSIS — E669 Obesity, unspecified: Secondary | ICD-10-CM | POA: Diagnosis not present

## 2016-10-22 DIAGNOSIS — M0579 Rheumatoid arthritis with rheumatoid factor of multiple sites without organ or systems involvement: Secondary | ICD-10-CM | POA: Diagnosis not present

## 2016-10-22 DIAGNOSIS — Z6836 Body mass index (BMI) 36.0-36.9, adult: Secondary | ICD-10-CM | POA: Diagnosis not present

## 2016-10-25 DIAGNOSIS — Z125 Encounter for screening for malignant neoplasm of prostate: Secondary | ICD-10-CM | POA: Diagnosis not present

## 2016-10-25 DIAGNOSIS — E785 Hyperlipidemia, unspecified: Secondary | ICD-10-CM | POA: Diagnosis not present

## 2016-10-25 DIAGNOSIS — I10 Essential (primary) hypertension: Secondary | ICD-10-CM | POA: Diagnosis not present

## 2016-10-26 LAB — PSA: Prostate Specific Ag, Serum: 0.8 ng/mL (ref 0.0–4.0)

## 2016-10-26 LAB — LIPID PANEL
CHOLESTEROL TOTAL: 142 mg/dL (ref 100–199)
Chol/HDL Ratio: 4.3 ratio units (ref 0.0–5.0)
HDL: 33 mg/dL — AB (ref >39)
LDL Calculated: 96 mg/dL (ref 0–99)
Triglycerides: 66 mg/dL (ref 0–149)
VLDL CHOLESTEROL CAL: 13 mg/dL (ref 5–40)

## 2016-10-26 LAB — BASIC METABOLIC PANEL
BUN / CREAT RATIO: 25 — AB (ref 9–20)
BUN: 28 mg/dL — AB (ref 6–24)
CO2: 21 mmol/L (ref 18–29)
CREATININE: 1.1 mg/dL (ref 0.76–1.27)
Calcium: 9.5 mg/dL (ref 8.7–10.2)
Chloride: 104 mmol/L (ref 96–106)
GFR calc non Af Amer: 76 mL/min/{1.73_m2} (ref >59)
GFR, EST AFRICAN AMERICAN: 88 mL/min/{1.73_m2} (ref >59)
Glucose: 102 mg/dL — ABNORMAL HIGH (ref 65–99)
Potassium: 4.4 mmol/L (ref 3.5–5.2)
SODIUM: 144 mmol/L (ref 134–144)

## 2016-10-26 LAB — HEPATIC FUNCTION PANEL
ALBUMIN: 4.1 g/dL (ref 3.5–5.5)
ALK PHOS: 67 IU/L (ref 39–117)
ALT: 22 IU/L (ref 0–44)
AST: 17 IU/L (ref 0–40)
BILIRUBIN, DIRECT: 0.16 mg/dL (ref 0.00–0.40)
Bilirubin Total: 0.6 mg/dL (ref 0.0–1.2)
TOTAL PROTEIN: 6.6 g/dL (ref 6.0–8.5)

## 2016-10-31 ENCOUNTER — Encounter: Payer: Self-pay | Admitting: Family Medicine

## 2016-11-05 ENCOUNTER — Ambulatory Visit (INDEPENDENT_AMBULATORY_CARE_PROVIDER_SITE_OTHER): Payer: BLUE CROSS/BLUE SHIELD | Admitting: Nurse Practitioner

## 2016-11-05 ENCOUNTER — Encounter: Payer: Self-pay | Admitting: Nurse Practitioner

## 2016-11-05 VITALS — BP 140/82 | Ht 70.25 in | Wt 244.4 lb

## 2016-11-05 DIAGNOSIS — I10 Essential (primary) hypertension: Secondary | ICD-10-CM | POA: Diagnosis not present

## 2016-11-05 DIAGNOSIS — R7301 Impaired fasting glucose: Secondary | ICD-10-CM | POA: Diagnosis not present

## 2016-11-05 DIAGNOSIS — E785 Hyperlipidemia, unspecified: Secondary | ICD-10-CM

## 2016-11-05 NOTE — Progress Notes (Signed)
Subjective:  Presents for routine follow up. Does fairly well with his diet. Has a very active job requiring walking. No CP/ischemic type pain or SOB. No edema. See rheumatologist for RA. Will be starting Enbrel soon.   Objective:   BP 140/82   Ht 5' 10.25" (1.784 m)   Wt 244 lb 6 oz (110.8 kg)   BMI 34.82 kg/m  NAD. Alert, oriented. Lungs clear. Heart RRR. Carotids no bruits or thrills. Lower extremities no edema. Weight stable x 2 years. Central obesity noted.  Reviewed labs with patient from 10/25/16; FBS 102.  Assessment:  Problem List Items Addressed This Visit      Cardiovascular and Mediastinum   Essential hypertension, benign - Primary     Endocrine   Elevated fasting blood sugar     Other   Hyperlipidemia      Plan: continue current regimen. Lengthy discussion about diet. Set a goal of 5-10 lbs of weight loss. Strongly recommend preventive health physical. Return in about 6 months (around 05/06/2017) for recheck.

## 2016-12-18 ENCOUNTER — Other Ambulatory Visit: Payer: Self-pay | Admitting: Family Medicine

## 2016-12-24 DIAGNOSIS — M0579 Rheumatoid arthritis with rheumatoid factor of multiple sites without organ or systems involvement: Secondary | ICD-10-CM | POA: Diagnosis not present

## 2016-12-24 DIAGNOSIS — Z79899 Other long term (current) drug therapy: Secondary | ICD-10-CM | POA: Diagnosis not present

## 2017-04-01 DIAGNOSIS — M0579 Rheumatoid arthritis with rheumatoid factor of multiple sites without organ or systems involvement: Secondary | ICD-10-CM | POA: Diagnosis not present

## 2017-04-01 DIAGNOSIS — Z79899 Other long term (current) drug therapy: Secondary | ICD-10-CM | POA: Diagnosis not present

## 2017-04-22 DIAGNOSIS — L821 Other seborrheic keratosis: Secondary | ICD-10-CM | POA: Diagnosis not present

## 2017-04-22 DIAGNOSIS — L918 Other hypertrophic disorders of the skin: Secondary | ICD-10-CM | POA: Diagnosis not present

## 2017-04-22 DIAGNOSIS — D225 Melanocytic nevi of trunk: Secondary | ICD-10-CM | POA: Diagnosis not present

## 2017-06-03 DIAGNOSIS — M722 Plantar fascial fibromatosis: Secondary | ICD-10-CM | POA: Diagnosis not present

## 2017-06-03 DIAGNOSIS — Z79899 Other long term (current) drug therapy: Secondary | ICD-10-CM | POA: Diagnosis not present

## 2017-06-03 DIAGNOSIS — M0579 Rheumatoid arthritis with rheumatoid factor of multiple sites without organ or systems involvement: Secondary | ICD-10-CM | POA: Diagnosis not present

## 2017-06-08 ENCOUNTER — Other Ambulatory Visit: Payer: Self-pay | Admitting: Nurse Practitioner

## 2017-07-04 ENCOUNTER — Telehealth: Payer: Self-pay | Admitting: Family Medicine

## 2017-07-04 DIAGNOSIS — Z125 Encounter for screening for malignant neoplasm of prostate: Secondary | ICD-10-CM

## 2017-07-04 DIAGNOSIS — I10 Essential (primary) hypertension: Secondary | ICD-10-CM

## 2017-07-04 DIAGNOSIS — E785 Hyperlipidemia, unspecified: Secondary | ICD-10-CM

## 2017-07-04 DIAGNOSIS — Z79899 Other long term (current) drug therapy: Secondary | ICD-10-CM

## 2017-07-04 DIAGNOSIS — R7301 Impaired fasting glucose: Secondary | ICD-10-CM

## 2017-07-04 NOTE — Telephone Encounter (Signed)
Last labs 10/25/16 lipid, liver, bmp, psa

## 2017-07-04 NOTE — Telephone Encounter (Signed)
Rep same 

## 2017-07-04 NOTE — Telephone Encounter (Signed)
Pt is requesting lab orders to be sent over for an upcoming appt with Dr. Richardson Landry on 8/28.

## 2017-07-04 NOTE — Telephone Encounter (Signed)
Patient is aware 

## 2017-07-09 DIAGNOSIS — Z125 Encounter for screening for malignant neoplasm of prostate: Secondary | ICD-10-CM | POA: Diagnosis not present

## 2017-07-09 DIAGNOSIS — Z79899 Other long term (current) drug therapy: Secondary | ICD-10-CM | POA: Diagnosis not present

## 2017-07-09 DIAGNOSIS — E785 Hyperlipidemia, unspecified: Secondary | ICD-10-CM | POA: Diagnosis not present

## 2017-07-09 DIAGNOSIS — I10 Essential (primary) hypertension: Secondary | ICD-10-CM | POA: Diagnosis not present

## 2017-07-10 LAB — HEPATIC FUNCTION PANEL
ALBUMIN: 4.3 g/dL (ref 3.5–5.5)
ALK PHOS: 65 IU/L (ref 39–117)
ALT: 23 IU/L (ref 0–44)
AST: 21 IU/L (ref 0–40)
Bilirubin Total: 0.5 mg/dL (ref 0.0–1.2)
Bilirubin, Direct: 0.1 mg/dL (ref 0.00–0.40)
TOTAL PROTEIN: 6.8 g/dL (ref 6.0–8.5)

## 2017-07-10 LAB — LIPID PANEL
CHOL/HDL RATIO: 3.7 ratio (ref 0.0–5.0)
Cholesterol, Total: 157 mg/dL (ref 100–199)
HDL: 42 mg/dL (ref >39)
LDL CALC: 101 mg/dL — AB (ref 0–99)
Triglycerides: 69 mg/dL (ref 0–149)
VLDL CHOLESTEROL CAL: 14 mg/dL (ref 5–40)

## 2017-07-10 LAB — BASIC METABOLIC PANEL
BUN/Creatinine Ratio: 23 — ABNORMAL HIGH (ref 9–20)
BUN: 26 mg/dL — ABNORMAL HIGH (ref 6–24)
CALCIUM: 9.1 mg/dL (ref 8.7–10.2)
CO2: 21 mmol/L (ref 20–29)
CREATININE: 1.14 mg/dL (ref 0.76–1.27)
Chloride: 107 mmol/L — ABNORMAL HIGH (ref 96–106)
GFR calc Af Amer: 84 mL/min/{1.73_m2} (ref >59)
GFR, EST NON AFRICAN AMERICAN: 73 mL/min/{1.73_m2} (ref >59)
GLUCOSE: 95 mg/dL (ref 65–99)
Potassium: 4.9 mmol/L (ref 3.5–5.2)
SODIUM: 144 mmol/L (ref 134–144)

## 2017-07-10 LAB — PSA: PROSTATE SPECIFIC AG, SERUM: 0.7 ng/mL (ref 0.0–4.0)

## 2017-07-15 ENCOUNTER — Ambulatory Visit (INDEPENDENT_AMBULATORY_CARE_PROVIDER_SITE_OTHER): Payer: BLUE CROSS/BLUE SHIELD | Admitting: Family Medicine

## 2017-07-15 ENCOUNTER — Other Ambulatory Visit: Payer: Self-pay | Admitting: *Deleted

## 2017-07-15 ENCOUNTER — Encounter: Payer: Self-pay | Admitting: Family Medicine

## 2017-07-15 VITALS — BP 138/84 | Ht 70.0 in | Wt 246.0 lb

## 2017-07-15 DIAGNOSIS — E785 Hyperlipidemia, unspecified: Secondary | ICD-10-CM

## 2017-07-15 DIAGNOSIS — N400 Enlarged prostate without lower urinary tract symptoms: Secondary | ICD-10-CM

## 2017-07-15 DIAGNOSIS — R7301 Impaired fasting glucose: Secondary | ICD-10-CM

## 2017-07-15 DIAGNOSIS — I10 Essential (primary) hypertension: Secondary | ICD-10-CM | POA: Diagnosis not present

## 2017-07-15 MED ORDER — TAMSULOSIN HCL 0.4 MG PO CAPS: 0.4000 mg | ORAL_CAPSULE | Freq: Every day | ORAL | 3 refills | Status: DC

## 2017-07-15 MED ORDER — LOSARTAN POTASSIUM 100 MG PO TABS: 100.0000 mg | ORAL_TABLET | Freq: Every day | ORAL | 1 refills | Status: DC

## 2017-07-15 MED ORDER — PRAVASTATIN SODIUM 40 MG PO TABS: 40.0000 mg | ORAL_TABLET | Freq: Every day | ORAL | 1 refills | Status: DC

## 2017-07-15 MED ORDER — TAMSULOSIN HCL 0.4 MG PO CAPS: 0.4000 mg | ORAL_CAPSULE | Freq: Every day | ORAL | 0 refills | Status: DC

## 2017-07-15 NOTE — Progress Notes (Signed)
Subjective:    Patient ID: Barry Gibson, male    DOB: 06-Dec-1960, 56 y.o.   MRN: [id number]  Hyperlipidemia  This is a chronic problem. Treatments tried: pravastatin. Compliance problems include adherence to diet.    Pt stopped taking aspirin because it made his wife bruise and now he wants to know if he should take it or not.   Blood pressure medicine and blood pressure levels reviewed today with patient. Compliant with blood pressure medicine. States does not miss a dose. No obvious side effects. Blood pressure generally good when checked elsewhere. Watching salt intake.  Patient continues to take lipid medication regularly. No obvious side effects from it. Generally does not miss a dose. Prior blood work results are reviewed with patient. Patient continues to work on fat intake in diet  Not exercising much  BP ck ed elsewhere does at pharm usually numbrs pretty good   Progressive trouble urinating over the last several years ago is getting up once per night. Now getting up twice at night. Stream during the day not good. No dysuria  Results for orders placed or performed in visit on 07/04/17  Lipid panel  Result Value Ref Range   Cholesterol, Total 157 100 - 199 mg/dL   Triglycerides 69 0 - 149 mg/dL   HDL 42 >39 mg/dL   VLDL Cholesterol Cal 14 5 - 40 mg/dL   LDL Calculated 101 (H) 0 - 99 mg/dL   Chol/HDL Ratio 3.7 0.0 - 5.0 ratio  Hepatic function panel  Result Value Ref Range   Total Protein 6.8 6.0 - 8.5 g/dL   Albumin 4.3 3.5 - 5.5 g/dL   Bilirubin Total 0.5 0.0 - 1.2 mg/dL   Bilirubin, Direct 0.10 0.00 - 0.40 mg/dL   Alkaline Phosphatase 65 39 - 117 IU/L   AST 21 0 - 40 IU/L   ALT 23 0 - 44 IU/L  Basic metabolic panel  Result Value Ref Range   Glucose 95 65 - 99 mg/dL   BUN 26 (H) 6 - 24 mg/dL   Creatinine, Ser 1.14 0.76 - 1.27 mg/dL   GFR calc non Af Amer 73 >59 mL/min/1.73   GFR calc Af Amer 84 >59 mL/min/1.73   BUN/Creatinine Ratio 23 (H) 9 - 20   Sodium  144 134 - 144 mmol/L   Potassium 4.9 3.5 - 5.2 mmol/L   Chloride 107 (H) 96 - 106 mmol/L   CO2 21 20 - 29 mmol/L   Calcium 9.1 8.7 - 10.2 mg/dL  PSA  Result Value Ref Range   Prostate Specific Ag, Serum 0.7 0.0 - 4.0 ng/mL     Review of Systems No headache, no major weight loss or weight gain, no chest pain no back pain abdominal pain no change in bowel habits complete ROS otherwise negative     Objective:   Physical Exam  Alert and oriented, vitals reviewed and stable, NAD ENT-TM's and ext canals WNL bilat via otoscopic exam Soft palate, tonsils and post pharynx WNL via oropharyngeal exam Neck-symmetric, no masses; thyroid nonpalpable and nontender Pulmonary-no tachypnea or accessory muscle use; Clear without wheezes via auscultation Card--no abnrml murmurs, rhythm reg and rate WNL Carotid pulses symmetric, without bruits       Assessment & Plan:  Impression 1 hypertension good control repeat maintain same  #2 hyperlipidemia good control blood work reviewed to maintain same  3 prostate hypertrophy. Significant symptoms pros and cons of initiating medicine discussed patient would like to try medicine  #  4 prediabetes clinically much improved  #5 rheumatoid arthritis improving on current therapy.  Recheck in 6 months for chronic plus wellness

## 2017-08-26 DIAGNOSIS — M0579 Rheumatoid arthritis with rheumatoid factor of multiple sites without organ or systems involvement: Secondary | ICD-10-CM | POA: Diagnosis not present

## 2017-08-26 DIAGNOSIS — Z79899 Other long term (current) drug therapy: Secondary | ICD-10-CM | POA: Diagnosis not present

## 2017-08-26 DIAGNOSIS — M722 Plantar fascial fibromatosis: Secondary | ICD-10-CM | POA: Diagnosis not present

## 2017-10-03 ENCOUNTER — Telehealth: Payer: Self-pay | Admitting: Family Medicine

## 2017-10-03 NOTE — Telephone Encounter (Signed)
Spoke with patient and patient stated that the flomax worked for [employer] and now he is back to waking up in the middle of the night to go to the bathroom frequently just like before starting the medication. Please advise?

## 2017-10-03 NOTE — Telephone Encounter (Signed)
Patient is requesting an increase on the dosage of his Tamsulosin.  He would like a nurse to call him back to discuss details.   90 day supply [facility]

## 2017-10-03 NOTE — Telephone Encounter (Signed)
Left message return call 10/03/17

## 2017-10-06 ENCOUNTER — Telehealth: Payer: Self-pay | Admitting: Family Medicine

## 2017-10-06 MED ORDER — TAMSULOSIN HCL 0.4 MG PO CAPS: 0.8000 mg | ORAL_CAPSULE | Freq: Every day | ORAL | 1 refills | Status: DC

## 2017-10-06 NOTE — Telephone Encounter (Signed)
Increase to two 0.4s per evening, six mo worth

## 2017-10-06 NOTE — Telephone Encounter (Addendum)
Increase to two 0.4s per evening, six mo worth.  Prescription sent electronically to pharmacy. Patient notified

## 2017-10-06 NOTE — Telephone Encounter (Signed)
Wife calling back because they never heard anything about Flomax last week.  She said if he needs to take a higher dosage or a different presciption then they will need a new prescription sent to [facility] and they usually get 90 day supply.  She said he still has some pills left if Dr. Richardson Landry wants him to double up on the pills to try to see if that helps.  She said [place] is at work and you can leave a message on his cell, [phone] or you can call her at [phone].

## 2017-10-06 NOTE — Telephone Encounter (Signed)
Prescription sent electronically to pharmacy. Patient notified. 

## 2017-11-29 ENCOUNTER — Other Ambulatory Visit: Payer: Self-pay | Admitting: Family Medicine

## 2017-12-09 DIAGNOSIS — M150 Primary generalized (osteo)arthritis: Secondary | ICD-10-CM | POA: Diagnosis not present

## 2017-12-09 DIAGNOSIS — M0579 Rheumatoid arthritis with rheumatoid factor of multiple sites without organ or systems involvement: Secondary | ICD-10-CM | POA: Diagnosis not present

## 2017-12-09 DIAGNOSIS — M722 Plantar fascial fibromatosis: Secondary | ICD-10-CM | POA: Diagnosis not present

## 2017-12-15 DIAGNOSIS — D123 Benign neoplasm of transverse colon: Secondary | ICD-10-CM | POA: Diagnosis not present

## 2017-12-15 DIAGNOSIS — D125 Benign neoplasm of sigmoid colon: Secondary | ICD-10-CM | POA: Diagnosis not present

## 2017-12-15 DIAGNOSIS — Z1211 Encounter for screening for malignant neoplasm of colon: Secondary | ICD-10-CM | POA: Diagnosis not present

## 2017-12-15 DIAGNOSIS — D120 Benign neoplasm of cecum: Secondary | ICD-10-CM | POA: Diagnosis not present

## 2018-01-09 ENCOUNTER — Other Ambulatory Visit: Payer: Self-pay | Admitting: Family Medicine

## 2018-03-17 DIAGNOSIS — Z79899 Other long term (current) drug therapy: Secondary | ICD-10-CM | POA: Diagnosis not present

## 2018-03-17 DIAGNOSIS — M0579 Rheumatoid arthritis with rheumatoid factor of multiple sites without organ or systems involvement: Secondary | ICD-10-CM | POA: Diagnosis not present

## 2018-03-17 DIAGNOSIS — M150 Primary generalized (osteo)arthritis: Secondary | ICD-10-CM | POA: Diagnosis not present

## 2018-03-26 ENCOUNTER — Telehealth: Payer: Self-pay | Admitting: Family Medicine

## 2018-03-26 ENCOUNTER — Other Ambulatory Visit: Payer: Self-pay | Admitting: Family Medicine

## 2018-03-26 DIAGNOSIS — Z0000 Encounter for general adult medical examination without abnormal findings: Secondary | ICD-10-CM

## 2018-03-26 DIAGNOSIS — E785 Hyperlipidemia, unspecified: Secondary | ICD-10-CM

## 2018-03-26 DIAGNOSIS — Z79899 Other long term (current) drug therapy: Secondary | ICD-10-CM

## 2018-03-26 DIAGNOSIS — Z125 Encounter for screening for malignant neoplasm of prostate: Secondary | ICD-10-CM

## 2018-03-26 NOTE — Telephone Encounter (Signed)
Requesting orders for labs.  He will call back to schedule his physical.

## 2018-03-26 NOTE — Telephone Encounter (Signed)
Call pt and sched wellness plus chronic. Then refill meds times one

## 2018-03-26 NOTE — Telephone Encounter (Signed)
Rep same 

## 2018-03-27 NOTE — Telephone Encounter (Signed)
Orders put in. Left message to return call to notify pt.  

## 2018-03-30 NOTE — Telephone Encounter (Signed)
Left message to return call 

## 2018-04-01 NOTE — Telephone Encounter (Signed)
Left message to return call 

## 2018-04-02 DIAGNOSIS — E785 Hyperlipidemia, unspecified: Secondary | ICD-10-CM | POA: Diagnosis not present

## 2018-04-02 DIAGNOSIS — Z79899 Other long term (current) drug therapy: Secondary | ICD-10-CM | POA: Diagnosis not present

## 2018-04-02 DIAGNOSIS — Z0000 Encounter for general adult medical examination without abnormal findings: Secondary | ICD-10-CM | POA: Diagnosis not present

## 2018-04-02 DIAGNOSIS — Z125 Encounter for screening for malignant neoplasm of prostate: Secondary | ICD-10-CM | POA: Diagnosis not present

## 2018-04-03 LAB — BASIC METABOLIC PANEL
BUN/Creatinine Ratio: 19 (ref 9–20)
BUN: 20 mg/dL (ref 6–24)
CHLORIDE: 107 mmol/L — AB (ref 96–106)
CO2: 24 mmol/L (ref 20–29)
CREATININE: 1.03 mg/dL (ref 0.76–1.27)
Calcium: 8.9 mg/dL (ref 8.7–10.2)
GFR calc Af Amer: 94 mL/min/{1.73_m2} (ref >59)
GFR calc non Af Amer: 81 mL/min/{1.73_m2} (ref >59)
GLUCOSE: 98 mg/dL (ref 65–99)
Potassium: 4.9 mmol/L (ref 3.5–5.2)
SODIUM: 142 mmol/L (ref 134–144)

## 2018-04-03 LAB — LIPID PANEL
CHOL/HDL RATIO: 4.1 ratio (ref 0.0–5.0)
Cholesterol, Total: 151 mg/dL (ref 100–199)
HDL: 37 mg/dL — AB (ref >39)
LDL Calculated: 102 mg/dL — ABNORMAL HIGH (ref 0–99)
TRIGLYCERIDES: 62 mg/dL (ref 0–149)
VLDL CHOLESTEROL CAL: 12 mg/dL (ref 5–40)

## 2018-04-03 LAB — HEPATIC FUNCTION PANEL
ALK PHOS: 60 IU/L (ref 39–117)
ALT: 20 IU/L (ref 0–44)
AST: 18 IU/L (ref 0–40)
Albumin: 4.1 g/dL (ref 3.5–5.5)
BILIRUBIN, DIRECT: 0.19 mg/dL (ref 0.00–0.40)
Bilirubin Total: 0.8 mg/dL (ref 0.0–1.2)
Total Protein: 6.6 g/dL (ref 6.0–8.5)

## 2018-04-03 LAB — PSA: Prostate Specific Ag, Serum: 0.7 ng/mL (ref 0.0–4.0)

## 2018-04-03 NOTE — Telephone Encounter (Signed)
Mailed green card

## 2018-04-07 ENCOUNTER — Ambulatory Visit (INDEPENDENT_AMBULATORY_CARE_PROVIDER_SITE_OTHER): Payer: BLUE CROSS/BLUE SHIELD | Admitting: Family Medicine

## 2018-04-07 ENCOUNTER — Encounter: Payer: Self-pay | Admitting: Family Medicine

## 2018-04-07 VITALS — BP 140/86 | Ht 70.0 in | Wt 246.0 lb

## 2018-04-07 DIAGNOSIS — M05771 Rheumatoid arthritis with rheumatoid factor of right ankle and foot without organ or systems involvement: Secondary | ICD-10-CM

## 2018-04-07 DIAGNOSIS — E785 Hyperlipidemia, unspecified: Secondary | ICD-10-CM

## 2018-04-07 DIAGNOSIS — I10 Essential (primary) hypertension: Secondary | ICD-10-CM | POA: Diagnosis not present

## 2018-04-07 DIAGNOSIS — N400 Enlarged prostate without lower urinary tract symptoms: Secondary | ICD-10-CM | POA: Diagnosis not present

## 2018-04-07 DIAGNOSIS — M069 Rheumatoid arthritis, unspecified: Secondary | ICD-10-CM | POA: Insufficient documentation

## 2018-04-07 DIAGNOSIS — M05772 Rheumatoid arthritis with rheumatoid factor of left ankle and foot without organ or systems involvement: Secondary | ICD-10-CM | POA: Diagnosis not present

## 2018-04-07 MED ORDER — CHLORTHALIDONE 25 MG PO TABS: 12.5000 mg | ORAL_TABLET | Freq: Every day | ORAL | 1 refills | Status: DC

## 2018-04-07 MED ORDER — LOSARTAN POTASSIUM 100 MG PO TABS: 100.0000 mg | ORAL_TABLET | Freq: Every day | ORAL | 1 refills | Status: DC

## 2018-04-07 MED ORDER — PRAVASTATIN SODIUM 40 MG PO TABS: 40.0000 mg | ORAL_TABLET | Freq: Every day | ORAL | 1 refills | Status: DC

## 2018-04-07 NOTE — Progress Notes (Signed)
Subjective:    Patient ID: Barry Gibson, male    DOB: 1961-08-11, 57 y.o.   MRN: [id number]  Hypertension  This is a chronic problem. The current episode started more than 1 year ago. Risk factors for coronary artery disease include dyslipidemia and male gender. Treatments tried: cozaar. There are no compliance problems.     Results for orders placed or performed in visit on 03/26/18  Lipid panel  Result Value Ref Range   Cholesterol, Total 151 100 - 199 mg/dL   Triglycerides 62 0 - 149 mg/dL   HDL 37 (L) >39 mg/dL   VLDL Cholesterol Cal 12 5 - 40 mg/dL   LDL Calculated 102 (H) 0 - 99 mg/dL   Chol/HDL Ratio 4.1 0.0 - 5.0 ratio  Hepatic function panel  Result Value Ref Range   Total Protein 6.6 6.0 - 8.5 g/dL   Albumin 4.1 3.5 - 5.5 g/dL   Bilirubin Total 0.8 0.0 - 1.2 mg/dL   Bilirubin, Direct 0.19 0.00 - 0.40 mg/dL   Alkaline Phosphatase 60 39 - 117 IU/L   AST 18 0 - 40 IU/L   ALT 20 0 - 44 IU/L  Basic metabolic panel  Result Value Ref Range   Glucose 98 65 - 99 mg/dL   BUN 20 6 - 24 mg/dL   Creatinine, Ser 1.03 0.76 - 1.27 mg/dL   GFR calc non Af Amer 81 >59 mL/min/1.73   GFR calc Af Amer 94 >59 mL/min/1.73   BUN/Creatinine Ratio 19 9 - 20   Sodium 142 134 - 144 mmol/L   Potassium 4.9 3.5 - 5.2 mmol/L   Chloride 107 (H) 96 - 106 mmol/L   CO2 24 20 - 29 mmol/L   Calcium 8.9 8.7 - 10.2 mg/dL  PSA  Result Value Ref Range   Prostate Specific Ag, Serum 0.7 0.0 - 4.0 ng/mL   Patient continues to take lipid medication regularly. No obvious side effects from it. Generally does not miss a dose. Prior blood work results are reviewed with patient. Patient continues to work on fat intake in diet  Blood pressure medicine and blood pressure levels reviewed today with patient. Compliant with blood pressure medicine. States does not miss a dose. No obvious side effects. Blood pressure generally good when checked elsewhere. Watching salt intake.  BP tends to run a little on the  high side, 140 ish or 136 ish  Now has dx of rheum arthirti, followed by specialist for this, meds are helping   flomax helping a lot with the two pills per day .  Major challenge with the medication.  Definitely helping.  Patient would like to maintain. Review of Systems No headache, no major weight loss or weight gain, no chest pain no back pain abdominal pain no change in bowel habits complete ROS otherwise negative     Objective:   Physical Exam  Alert and oriented, vitals reviewed and stable, NAD ENT-TM's and ext canals WNL bilat via otoscopic exam Soft palate, tonsils and post pharynx WNL via oropharyngeal exam Neck-symmetric, no masses; thyroid nonpalpable and nontender Pulmonary-no tachypnea or accessory muscle use; Clear without wheezes via auscultation Card--no abnrml murmurs, rhythm reg and rate WNL Carotid pulses symmetric, without bruits Some joint inflammation and swelling.      Assessment & Plan:  Impression 1 hypertension.  Suboptimal control.  Discussed.  Add chlorthalidone to current regimen.  Rationale discussed diet exercise discussed  2.  Prostate hypertrophy.  Ongoing benefit from meds.  Meds  to maintain same.  Her graph #3 hyperlipidemia.  Prior blood work reviewed.  Maintain compliance with statins rationale discussed  4.  Rheumatoid arthritis followed by specialist.  Follow-up in 6 months for chronic illness management along with wellness exam/medications refilled/diet exercise discussed

## 2018-04-09 NOTE — Telephone Encounter (Signed)
Patient completed blood work and had office visit with Dr Richardson Landry 04/07/18

## 2018-06-04 DIAGNOSIS — M0579 Rheumatoid arthritis with rheumatoid factor of multiple sites without organ or systems involvement: Secondary | ICD-10-CM | POA: Diagnosis not present

## 2018-06-04 DIAGNOSIS — M150 Primary generalized (osteo)arthritis: Secondary | ICD-10-CM | POA: Diagnosis not present

## 2018-06-04 DIAGNOSIS — Z79899 Other long term (current) drug therapy: Secondary | ICD-10-CM | POA: Diagnosis not present

## 2018-08-27 ENCOUNTER — Other Ambulatory Visit: Payer: Self-pay | Admitting: Family Medicine

## 2018-08-31 ENCOUNTER — Ambulatory Visit (INDEPENDENT_AMBULATORY_CARE_PROVIDER_SITE_OTHER): Payer: BLUE CROSS/BLUE SHIELD | Admitting: Family Medicine

## 2018-08-31 ENCOUNTER — Encounter: Payer: Self-pay | Admitting: Family Medicine

## 2018-08-31 VITALS — BP 132/74 | Temp 98.5°F | Ht 70.0 in | Wt 252.2 lb

## 2018-08-31 DIAGNOSIS — J329 Chronic sinusitis, unspecified: Secondary | ICD-10-CM | POA: Diagnosis not present

## 2018-08-31 DIAGNOSIS — J310 Chronic rhinitis: Secondary | ICD-10-CM

## 2018-08-31 MED ORDER — AMOXICILLIN-POT CLAVULANATE 875-125 MG PO TABS: ORAL_TABLET | ORAL | 0 refills | Status: DC

## 2018-08-31 MED ORDER — HYDROCODONE-HOMATROPINE 5-1.5 MG/5ML PO SYRP: ORAL_SOLUTION | ORAL | 0 refills | Status: DC

## 2018-08-31 NOTE — Progress Notes (Signed)
   Subjective:    Patient ID: Barry Gibson, male    DOB: May 07, 1961, 57 y.o.   MRN: [id number]  Cough  This is a new problem. The current episode started 1 to 4 weeks ago. Associated symptoms include headaches and a sore throat. Associated symptoms comments: Did have drainage but that has become better; fatigue. Treatments tried: OTC meds, tylenol cold/cough. The treatment provided mild (takes med at about 11 pm but then wakes up at about 3 am coughing) relief.    Started ten days ago  Throat pain and the cough   And then dranage and mre cough  Headache frontal in natutre, very bad cough at nioght     no fever   drange has calmed   Down   Po  s gunky disch     Frontal ha worse with cough, achey at time  Review of Systems  HENT: Positive for sore throat.   Respiratory: Positive for cough.   Neurological: Positive for headaches.       Objective:   Physical Exam  Alert, mild malaise. Hydration good Vitals stable. frontal/ maxillary tenderness evident positive nasal congestion. pharynx normal neck supple  lungs clear/no crackles or wheezes. heart regular in rhythm       Assessment & Plan:  Impression rhinosinusitis likely post viral, discussed with patient. plan antibiotics prescribed. Questions answered. Symptomatic care discussed. warning signs discussed. WSL

## 2018-09-07 ENCOUNTER — Other Ambulatory Visit: Payer: Self-pay | Admitting: Family Medicine

## 2018-09-08 ENCOUNTER — Encounter: Payer: Self-pay | Admitting: Family Medicine

## 2018-09-08 DIAGNOSIS — M0579 Rheumatoid arthritis with rheumatoid factor of multiple sites without organ or systems involvement: Secondary | ICD-10-CM | POA: Diagnosis not present

## 2018-10-05 ENCOUNTER — Telehealth: Payer: Self-pay | Admitting: Family Medicine

## 2018-10-05 DIAGNOSIS — R7301 Impaired fasting glucose: Secondary | ICD-10-CM

## 2018-10-05 DIAGNOSIS — E785 Hyperlipidemia, unspecified: Secondary | ICD-10-CM

## 2018-10-05 DIAGNOSIS — Z114 Encounter for screening for human immunodeficiency virus [HIV]: Secondary | ICD-10-CM

## 2018-10-05 DIAGNOSIS — I10 Essential (primary) hypertension: Secondary | ICD-10-CM

## 2018-10-05 NOTE — Telephone Encounter (Signed)
Received lab results from other office. Results placed in box in Dr. Jeannine Kitten office.

## 2018-10-05 NOTE — Telephone Encounter (Signed)
rfPt has CPE scheduled for 12/12. He is wanting to know if Dr. Richardson Landry would like to have labs drawn. Pt is having the rheumatology fax over his results from lab work 2 weeks ago for Dr. Richardson Landry to review and see if anything needs to be rechecked. Waiting on office to fax over results.

## 2018-10-05 NOTE — Telephone Encounter (Signed)
Last labs 03/2018: Lipid, Liver, Met 7 and PSA 

## 2018-10-19 NOTE — Telephone Encounter (Signed)
Pt's wife is checking on if labs need to be done before physical or not. Her CB# is [phone]. She called on 10/05/18 and had this rheumatologist fax over his latest lab work with them. Pt is off this week Wednesday through Friday and would like to get lab work done this if it is needed. I advised the wife that Dr. Richardson Landry is out of the office this week. If Dr. Nicki Reaper couple possible weigh in just to advise if labs need to be repeated before pt's CPE on 12/12.

## 2018-10-19 NOTE — Telephone Encounter (Signed)
So based on Dr. Jeannine Kitten notes this patient had a PSA done earlier this year so therefore that does not need to be done  It would be nice to find out from rheumatology if any of the following have been done recently if so they would not have to be repeated but it would also be nice to have a copy of those labs  So just based on patient's history and physical coming up the following is recommended Hepatitis C antibody screening HIV antibody screening Lipid-hyperlipidemia Liver-hyperlipidemia Metabolic 7-HTN  Remember if he has had any of these done within the last few months he does not have to do those specific test so please tailor the order according to what was stated above

## 2018-10-19 NOTE — Telephone Encounter (Signed)
I spoke with the patient I asked that he call the Rheumatologist and see what they had drawn and call me back with that information,also have them fax results.

## 2018-10-19 NOTE — Telephone Encounter (Signed)
Copy of rheumatologist labs not in office for review

## 2018-10-20 NOTE — Telephone Encounter (Signed)
Labs received from Rheumatology. Patient had labs via rheumatology 09/08/18 that included: CBC with diff, Comprehensive metabolic panel(met 7 and Liver)  Full Hepatitis panel including hep a, B and C was drawn and resulted 07/02/16 and is included on blood work results.  Results in basket -please advise

## 2018-10-20 NOTE — Telephone Encounter (Signed)
So based upon all of this information he will need the following HIV antibody screening-per CDC guidelines Lipid profile Fasting glucose  His other lab work is up-to-date- please scan a copy of this lab work into the medical record system but it would also be wise to make a copy of the lab work and make sure that it is available and with the patient's chart when he comes in for a checkup/physical

## 2018-10-20 NOTE — Telephone Encounter (Signed)
Patient is aware of all.Orders placed.[place] please scan once you get these records.

## 2018-10-22 DIAGNOSIS — R7301 Impaired fasting glucose: Secondary | ICD-10-CM | POA: Diagnosis not present

## 2018-10-22 DIAGNOSIS — Z114 Encounter for screening for human immunodeficiency virus [HIV]: Secondary | ICD-10-CM | POA: Diagnosis not present

## 2018-10-22 DIAGNOSIS — E785 Hyperlipidemia, unspecified: Secondary | ICD-10-CM | POA: Diagnosis not present

## 2018-10-23 LAB — LIPID PANEL
Chol/HDL Ratio: 4.5 ratio (ref 0.0–5.0)
Cholesterol, Total: 145 mg/dL (ref 100–199)
HDL: 32 mg/dL — ABNORMAL LOW (ref >39)
LDL CALC: 94 mg/dL (ref 0–99)
Triglycerides: 96 mg/dL (ref 0–149)
VLDL Cholesterol Cal: 19 mg/dL (ref 5–40)

## 2018-10-23 LAB — HIV ANTIBODY (ROUTINE TESTING W REFLEX): HIV Screen 4th Generation wRfx: NONREACTIVE

## 2018-10-23 LAB — GLUCOSE, RANDOM: Glucose: 100 mg/dL — ABNORMAL HIGH (ref 65–99)

## 2018-10-29 ENCOUNTER — Encounter: Payer: Self-pay | Admitting: Family Medicine

## 2018-10-29 ENCOUNTER — Ambulatory Visit (INDEPENDENT_AMBULATORY_CARE_PROVIDER_SITE_OTHER): Payer: BLUE CROSS/BLUE SHIELD | Admitting: Family Medicine

## 2018-10-29 VITALS — BP 136/90 | Ht 70.0 in | Wt 252.2 lb

## 2018-10-29 DIAGNOSIS — R7301 Impaired fasting glucose: Secondary | ICD-10-CM | POA: Diagnosis not present

## 2018-10-29 DIAGNOSIS — E785 Hyperlipidemia, unspecified: Secondary | ICD-10-CM | POA: Diagnosis not present

## 2018-10-29 DIAGNOSIS — Z0000 Encounter for general adult medical examination without abnormal findings: Secondary | ICD-10-CM

## 2018-10-29 DIAGNOSIS — M05772 Rheumatoid arthritis with rheumatoid factor of left ankle and foot without organ or systems involvement: Secondary | ICD-10-CM

## 2018-10-29 DIAGNOSIS — I10 Essential (primary) hypertension: Secondary | ICD-10-CM

## 2018-10-29 DIAGNOSIS — Z23 Encounter for immunization: Secondary | ICD-10-CM

## 2018-10-29 DIAGNOSIS — M05771 Rheumatoid arthritis with rheumatoid factor of right ankle and foot without organ or systems involvement: Secondary | ICD-10-CM

## 2018-10-29 DIAGNOSIS — R21 Rash and other nonspecific skin eruption: Secondary | ICD-10-CM

## 2018-10-29 DIAGNOSIS — N400 Enlarged prostate without lower urinary tract symptoms: Secondary | ICD-10-CM

## 2018-10-29 MED ORDER — TRIAMCINOLONE ACETONIDE 0.1 % EX CREA: TOPICAL_CREAM | CUTANEOUS | 2 refills | Status: DC

## 2018-10-29 MED ORDER — LOSARTAN POTASSIUM 100 MG PO TABS: 100.0000 mg | ORAL_TABLET | Freq: Every day | ORAL | 1 refills | Status: DC

## 2018-10-29 MED ORDER — FINASTERIDE 5 MG PO TABS: ORAL_TABLET | ORAL | 5 refills | Status: DC

## 2018-10-29 MED ORDER — SILDENAFIL CITRATE 20 MG PO TABS: ORAL_TABLET | ORAL | 5 refills | Status: DC

## 2018-10-29 MED ORDER — PRAVASTATIN SODIUM 40 MG PO TABS: 40.0000 mg | ORAL_TABLET | Freq: Every day | ORAL | 1 refills | Status: DC

## 2018-10-29 MED ORDER — TAMSULOSIN HCL 0.4 MG PO CAPS: 0.8000 mg | ORAL_CAPSULE | Freq: Every day | ORAL | 1 refills | Status: DC

## 2018-10-29 NOTE — Progress Notes (Signed)
Subjective:    Patient ID: Barry Gibson, male    DOB: 1961-04-12, 57 y.o.   MRN: [id number] Patient arrives with tremendous number of concerns on soreness HPI The patient comes in today for a wellness visit.    A review of their health history was completed.  A review of medications was also completed.  Any needed refills; none at this time  Eating habits: does not eat a lot of sweets; not a lot of junk food but loves [employer].   Falls/  MVA accidents in past few months: none  Regular exercise: some exercise  Specialist pt sees on regular basis: Dr.Young  Preventative health issues were discussed.   Additional concerns: poison oak maybe on knees and buttocks;   urinating about every hour on the hour, pt states he has to plan his day around urinating. Been going on about 1-2 months.  Claims complete compliance with Flomax.  Does not miss a dose.  Still having significant challenges    Has knot on stomach area, has been there about one month and has recently started to bother him with stomach pain. Pt has to sleep with pillow at night. Stomach pain is relieved by sitting up.   Patient continues to take lipid medication regularly. No obvious side effects from it. Generally does not miss a dose. Prior blood work results are reviewed with patient. Patient continues to work on fat intake in diet  Blood pressure medicine and blood pressure levels reviewed today with patient. Compliant with blood pressure medicine. States does not miss a dose. No obvious side effects. Blood pressure generally good when checked elsewhere. Watching salt intake.  Rash off and since the summer.  Patchy rash that follows eruption of a folliculitis bump itchy in nature. .this Results for orders placed or performed in visit on 10/05/18  HIV Antibody (routine testing w rflx)  Result Value Ref Range   HIV Screen 4th Generation wRfx Non Reactive Non Reactive  Lipid panel  Result Value Ref Range   Cholesterol, Total 145 100 - 199 mg/dL   Triglycerides 96 0 - 149 mg/dL   HDL 32 (L) >39 mg/dL   VLDL Cholesterol Cal 19 5 - 40 mg/dL   LDL Calculated 94 0 - 99 mg/dL   Chol/HDL Ratio 4.5 0.0 - 5.0 ratio  Glucose, random  Result Value Ref Range   Glucose 100 (H) 65 - 99 mg/dL    Review of Systems  Constitutional: Negative for activity change, appetite change and fever.  HENT: Negative for congestion and rhinorrhea.   Eyes: Negative for discharge.  Respiratory: Negative for cough and wheezing.   Cardiovascular: Negative for chest pain.  Gastrointestinal: Negative for abdominal pain, blood in stool and vomiting.  Genitourinary: Negative for difficulty urinating and frequency.  Musculoskeletal: Negative for neck pain.  Skin: Negative for rash.  Allergic/Immunologic: Negative for environmental allergies and food allergies.  Neurological: Negative for weakness and headaches.  Psychiatric/Behavioral: Negative for agitation.  All other systems reviewed and are negative.      Objective:   Physical Exam Vitals signs reviewed.  Constitutional:      Appearance: He is well-developed.     Comments: Substantial obesity present  HENT:     Head: Normocephalic and atraumatic.     Right Ear: External ear normal.     Left Ear: External ear normal.     Nose: Nose normal.  Eyes:     Pupils: Pupils are equal, round, and reactive to light.  Neck:     Musculoskeletal: Normal range of motion and neck supple.     Thyroid: No thyromegaly.  Cardiovascular:     Rate and Rhythm: Normal rate and regular rhythm.     Heart sounds: Normal heart sounds. No murmur.  Pulmonary:     Effort: Pulmonary effort is normal. No respiratory distress.     Breath sounds: Normal breath sounds. No wheezing.  Abdominal:     General: Bowel sounds are normal. There is no distension.     Palpations: Abdomen is soft. There is no mass.     Tenderness: There is no abdominal tenderness.     Comments: Substantial  relative bulging midline above the umbilicus but with no true hernia palpated.  Genitourinary:    Penis: Normal.      Comments: Prostate diffusely enlarged. Musculoskeletal: Normal range of motion.  Lymphadenopathy:     Cervical: No cervical adenopathy.  Skin:    General: Skin is warm and dry.     Findings: No erythema.     Comments: Small discrete scaly eczema patches.  Neurological:     Mental Status: He is alert.     Motor: No abnormal muscle tone.  Psychiatric:        Behavior: Behavior normal.        Judgment: Judgment normal.           Assessment & Plan:  Impression 1 wellness exam.  Diet discussed.  Exercise discussed.  Up-to-date on colonoscopy.  Vaccines discussed and explained.  Patient agrees fortunately to Tdap and flu shot  2.  Hypertension good control discussed to maintain same meds  3.  Hyperlipidemia.  Control discussed maintaining meds  4.  Prediabetes.  Clinically stable discussed to maintain same  5.  Eczema of the skin.  Discussed.  Will add triamcinolone  6.  Hernia concerns.  No true hernia present hold off on surgery referral rationale discussed  7.  Prostate hypertrophy.  Worsening.  Options discussed.  Initiate Proscar.  Interventions as noted above.  Follow-up in 6 months

## 2018-11-20 ENCOUNTER — Telehealth: Payer: Self-pay | Admitting: Family Medicine

## 2018-11-20 ENCOUNTER — Other Ambulatory Visit: Payer: Self-pay | Admitting: Family Medicine

## 2018-11-20 MED ORDER — DOXYCYCLINE HYCLATE 100 MG PO TABS: ORAL_TABLET | ORAL | 0 refills | Status: DC

## 2018-11-20 NOTE — Telephone Encounter (Signed)
Pt's wife is calling in regards to the cream that was prescribed to pt. He is almost out of the cream and it has not helped at all. Pt was told if the cream did not help to call back and have something else presribed in a pill form. Please send to [place], [place] [address]

## 2018-11-20 NOTE — Telephone Encounter (Signed)
Doxy 100 bid ten d 

## 2018-11-20 NOTE — Telephone Encounter (Signed)
Please advise. Thank you

## 2018-11-20 NOTE — Telephone Encounter (Signed)
Medication sent to pharmacy and pt wife is aware.

## 2018-12-06 ENCOUNTER — Other Ambulatory Visit: Payer: Self-pay | Admitting: Family Medicine

## 2018-12-11 ENCOUNTER — Other Ambulatory Visit: Payer: Self-pay | Admitting: Family Medicine

## 2018-12-11 ENCOUNTER — Telehealth: Payer: Self-pay | Admitting: Family Medicine

## 2018-12-11 DIAGNOSIS — N400 Enlarged prostate without lower urinary tract symptoms: Secondary | ICD-10-CM

## 2018-12-11 NOTE — Telephone Encounter (Signed)
yes

## 2018-12-11 NOTE — Telephone Encounter (Signed)
Lets do ref

## 2018-12-11 NOTE — Telephone Encounter (Signed)
Referral put in. Pt notified.  

## 2018-12-11 NOTE — Telephone Encounter (Signed)
Is the diagnosis for referral prostate hypertrophy?

## 2018-12-11 NOTE — Telephone Encounter (Signed)
Pt had wife call (no DPR on file - explained that until he signs a DPR we would have to speak with patient, wife verbalized understanding)  Requesting referral to [facility] - states he's tried 3 medicines with no relief  (wouldn't give info on condition we were treating)  Refer or NTBS?  Please advise.  If ok to refer, please initiate referral in system so that I may process

## 2018-12-14 ENCOUNTER — Telehealth: Payer: Self-pay | Admitting: Family Medicine

## 2018-12-14 MED ORDER — DOXYCYCLINE HYCLATE 100 MG PO TABS: ORAL_TABLET | ORAL | 0 refills | Status: DC

## 2018-12-14 NOTE — Telephone Encounter (Signed)
Patient is aware of all. 

## 2018-12-14 NOTE — Telephone Encounter (Signed)
Please advise 

## 2018-12-14 NOTE — Telephone Encounter (Signed)
Wife calling in to check on refill request for Doxycycline.  She said that [facility] was suppose to send [place] something over on Friday.  Michela Pitcher it was for his rash that is getting better but no all the way yet.  [facility] [address]   Still no DPR on file for wife

## 2018-12-14 NOTE — Telephone Encounter (Signed)
mayref times one

## 2018-12-15 DIAGNOSIS — M150 Primary generalized (osteo)arthritis: Secondary | ICD-10-CM | POA: Diagnosis not present

## 2018-12-15 DIAGNOSIS — M0579 Rheumatoid arthritis with rheumatoid factor of multiple sites without organ or systems involvement: Secondary | ICD-10-CM | POA: Diagnosis not present

## 2018-12-15 DIAGNOSIS — M25511 Pain in right shoulder: Secondary | ICD-10-CM | POA: Diagnosis not present

## 2018-12-15 DIAGNOSIS — Z79899 Other long term (current) drug therapy: Secondary | ICD-10-CM | POA: Diagnosis not present

## 2018-12-15 DIAGNOSIS — M722 Plantar fascial fibromatosis: Secondary | ICD-10-CM | POA: Diagnosis not present

## 2018-12-16 ENCOUNTER — Encounter: Payer: Self-pay | Admitting: Family Medicine

## 2018-12-29 DIAGNOSIS — R1013 Epigastric pain: Secondary | ICD-10-CM | POA: Diagnosis not present

## 2018-12-29 DIAGNOSIS — R1033 Periumbilical pain: Secondary | ICD-10-CM | POA: Diagnosis not present

## 2018-12-29 DIAGNOSIS — E669 Obesity, unspecified: Secondary | ICD-10-CM | POA: Diagnosis not present

## 2018-12-29 DIAGNOSIS — Z86010 Personal history of colonic polyps: Secondary | ICD-10-CM | POA: Diagnosis not present

## 2019-01-05 DIAGNOSIS — Z86010 Personal history of colonic polyps: Secondary | ICD-10-CM | POA: Diagnosis not present

## 2019-01-05 DIAGNOSIS — D123 Benign neoplasm of transverse colon: Secondary | ICD-10-CM | POA: Diagnosis not present

## 2019-01-05 DIAGNOSIS — R1013 Epigastric pain: Secondary | ICD-10-CM | POA: Diagnosis not present

## 2019-01-05 DIAGNOSIS — K3189 Other diseases of stomach and duodenum: Secondary | ICD-10-CM | POA: Diagnosis not present

## 2019-01-05 DIAGNOSIS — K259 Gastric ulcer, unspecified as acute or chronic, without hemorrhage or perforation: Secondary | ICD-10-CM | POA: Diagnosis not present

## 2019-01-05 DIAGNOSIS — D128 Benign neoplasm of rectum: Secondary | ICD-10-CM | POA: Diagnosis not present

## 2019-01-05 DIAGNOSIS — K621 Rectal polyp: Secondary | ICD-10-CM | POA: Diagnosis not present

## 2019-01-07 ENCOUNTER — Other Ambulatory Visit: Payer: Self-pay

## 2019-01-07 MED ORDER — CHLORTHALIDONE 25 MG PO TABS: 12.5000 mg | ORAL_TABLET | Freq: Every day | ORAL | 1 refills | Status: DC

## 2019-02-09 ENCOUNTER — Ambulatory Visit: Payer: Self-pay | Admitting: Urology

## 2019-03-11 ENCOUNTER — Other Ambulatory Visit: Payer: Self-pay

## 2019-03-11 MED ORDER — PRAVASTATIN SODIUM 40 MG PO TABS: 40.0000 mg | ORAL_TABLET | Freq: Every day | ORAL | 1 refills | Status: DC

## 2019-03-11 MED ORDER — LOSARTAN POTASSIUM 100 MG PO TABS: 100.0000 mg | ORAL_TABLET | Freq: Every day | ORAL | 1 refills | Status: DC

## 2019-03-16 ENCOUNTER — Ambulatory Visit (INDEPENDENT_AMBULATORY_CARE_PROVIDER_SITE_OTHER): Payer: BLUE CROSS/BLUE SHIELD | Admitting: Urology

## 2019-03-16 ENCOUNTER — Other Ambulatory Visit (HOSPITAL_COMMUNITY)
Admission: AD | Admit: 2019-03-16 | Discharge: 2019-03-16 | Disposition: A | Discharge status: Home / Self Care | Payer: BLUE CROSS/BLUE SHIELD | Source: Skilled Nursing Facility | Attending: Family Medicine | Admitting: Family Medicine

## 2019-03-16 DIAGNOSIS — R3121 Asymptomatic microscopic hematuria: Secondary | ICD-10-CM | POA: Diagnosis not present

## 2019-03-16 DIAGNOSIS — N401 Enlarged prostate with lower urinary tract symptoms: Secondary | ICD-10-CM | POA: Diagnosis not present

## 2019-03-16 DIAGNOSIS — R350 Frequency of micturition: Secondary | ICD-10-CM | POA: Diagnosis not present

## 2019-03-16 LAB — URINALYSIS, COMPLETE (UACMP) WITH MICROSCOPIC
Bilirubin Urine: NEGATIVE
Glucose, UA: NEGATIVE mg/dL
Ketones, ur: NEGATIVE mg/dL
Leukocytes,Ua: NEGATIVE
Nitrite: NEGATIVE
Protein, ur: NEGATIVE mg/dL
Specific Gravity, Urine: 1.018 (ref 1.005–1.030)
pH: 6 (ref 5.0–8.0)

## 2019-04-14 DIAGNOSIS — Z79899 Other long term (current) drug therapy: Secondary | ICD-10-CM | POA: Diagnosis not present

## 2019-04-14 DIAGNOSIS — M0579 Rheumatoid arthritis with rheumatoid factor of multiple sites without organ or systems involvement: Secondary | ICD-10-CM | POA: Diagnosis not present

## 2019-04-14 DIAGNOSIS — M150 Primary generalized (osteo)arthritis: Secondary | ICD-10-CM | POA: Diagnosis not present

## 2019-04-14 DIAGNOSIS — M722 Plantar fascial fibromatosis: Secondary | ICD-10-CM | POA: Diagnosis not present

## 2019-05-18 ENCOUNTER — Ambulatory Visit (INDEPENDENT_AMBULATORY_CARE_PROVIDER_SITE_OTHER): Payer: BC Managed Care – PPO | Admitting: Family Medicine

## 2019-05-18 ENCOUNTER — Other Ambulatory Visit: Payer: Self-pay

## 2019-05-18 ENCOUNTER — Ambulatory Visit: Payer: BLUE CROSS/BLUE SHIELD | Admitting: Family Medicine

## 2019-05-18 ENCOUNTER — Encounter: Payer: Self-pay | Admitting: Family Medicine

## 2019-05-18 DIAGNOSIS — Z79899 Other long term (current) drug therapy: Secondary | ICD-10-CM | POA: Diagnosis not present

## 2019-05-18 DIAGNOSIS — E785 Hyperlipidemia, unspecified: Secondary | ICD-10-CM

## 2019-05-18 DIAGNOSIS — Z131 Encounter for screening for diabetes mellitus: Secondary | ICD-10-CM

## 2019-05-18 MED ORDER — LOSARTAN POTASSIUM 100 MG PO TABS: 100.0000 mg | ORAL_TABLET | Freq: Every day | ORAL | 1 refills | Status: DC

## 2019-05-18 MED ORDER — PRAVASTATIN SODIUM 40 MG PO TABS: 40.0000 mg | ORAL_TABLET | Freq: Every day | ORAL | 1 refills | Status: DC

## 2019-05-18 MED ORDER — TAMSULOSIN HCL 0.4 MG PO CAPS: 0.8000 mg | ORAL_CAPSULE | Freq: Every day | ORAL | 1 refills | Status: DC

## 2019-05-18 NOTE — Progress Notes (Signed)
   Subjective:    Patient ID: Barry Gibson, male    DOB: Jul 30, 1961, 58 y.o.   MRN: [id number] Audio plus video Hyperlipidemia This is a chronic problem. The current episode started more than 1 year ago. Treatments tried: pravastatin. There are no compliance problems (takes meds every day, has lost weight since he has not been eating fast food, does some walking).    Pt states no concerns today.   Virtual Visit via Video Note  I connected with Barry Gibson on 05/18/19 at  8:40 AM EDT by a video enabled telemedicine application and verified that I am speaking with the correct person using two identifiers.  Location: Patient: home Provider: office   I discussed the limitations of evaluation and management by telemedicine and the availability of in person appointments. The patient expressed understanding and agreed to proceed.  History of Present Illness:    Observations/Objective:   Assessment and Plan:   Follow Up Instructions:    I discussed the assessment and treatment plan with the patient. The patient was provided an opportunity to ask questions and all were answered. The patient agreed with the plan and demonstrated an understanding of the instructions.   The patient was advised to call back or seek an in-person evaluation if the symptoms worsen or if the condition fails to improve as anticipated.  I provided 25 minutes of non-face-to-face time during this encounter.  Blood pressure medicine and blood pressure levels reviewed today with patient. Compliant with blood pressure medicine. States does not miss a dose. No obvious side effects. Blood pressure generally good when checked elsewhere. Watching salt intake.   Patient continues to take lipid medication regularly. No obvious side effects from it. Generally does not miss a dose. Prior blood work results are reviewed with patient. Patient continues to work on fat intake in diet  prostate.  Ongoing symptoms of  hypertrophy.  Generally the Flomax has helped.  Patient to maintain.  Patient has known prediabetes.  Admits to fair compliance with diet.   pred Review of Systems No headache, no major weight loss or weight gain, no chest pain no back pain abdominal pain no change in bowel habits complete ROS otherwise negative     Objective:   Physical Exam  Virtual      Assessment & Plan:  Impression prediabetes.  Exact status uncertain.  Will check glucose diet discussed  2.  Hyperlipidemia.  Compliant with medications will evaluate blood work  3.  Hypertension.  Good control discussed maintain same meds  4.  Prostate hypertrophy ongoing encouraged to maintain medications  Further recommendations based on blood work.  Diet exercise discussed.  Medications

## 2019-06-01 ENCOUNTER — Telehealth: Payer: Self-pay | Admitting: Family Medicine

## 2019-06-01 NOTE — Telephone Encounter (Signed)
Pt's wife called to see if we had ordered labs for pt (NO DPR on file)  Explained to pt's wife we could call pt directly & give that information  Please check to see if lab orders are in system & ready, pt is off work tomorrow & could go then  Pt's # [phone]

## 2019-06-01 NOTE — Telephone Encounter (Signed)
Pt  Notified labs are ready and he needs to be fasting. He verbalized understanding.

## 2019-06-02 DIAGNOSIS — Z79899 Other long term (current) drug therapy: Secondary | ICD-10-CM | POA: Diagnosis not present

## 2019-06-02 DIAGNOSIS — Z131 Encounter for screening for diabetes mellitus: Secondary | ICD-10-CM | POA: Diagnosis not present

## 2019-06-02 DIAGNOSIS — E785 Hyperlipidemia, unspecified: Secondary | ICD-10-CM | POA: Diagnosis not present

## 2019-06-03 LAB — HEPATIC FUNCTION PANEL
ALT: 19 IU/L (ref 0–44)
AST: 17 IU/L (ref 0–40)
Albumin: 4.2 g/dL (ref 3.8–4.9)
Alkaline Phosphatase: 57 IU/L (ref 39–117)
Bilirubin Total: 0.9 mg/dL (ref 0.0–1.2)
Bilirubin, Direct: 0.21 mg/dL (ref 0.00–0.40)
Total Protein: 6.3 g/dL (ref 6.0–8.5)

## 2019-06-03 LAB — LIPID PANEL
Chol/HDL Ratio: 4.7 ratio (ref 0.0–5.0)
Cholesterol, Total: 163 mg/dL (ref 100–199)
HDL: 35 mg/dL — ABNORMAL LOW (ref >39)
LDL Calculated: 108 mg/dL — ABNORMAL HIGH (ref 0–99)
Triglycerides: 99 mg/dL (ref 0–149)
VLDL Cholesterol Cal: 20 mg/dL (ref 5–40)

## 2019-06-03 LAB — GLUCOSE, RANDOM: Glucose: 102 mg/dL — ABNORMAL HIGH (ref 65–99)

## 2019-06-09 ENCOUNTER — Encounter: Payer: Self-pay | Admitting: Family Medicine

## 2019-06-24 ENCOUNTER — Ambulatory Visit (INDEPENDENT_AMBULATORY_CARE_PROVIDER_SITE_OTHER): Payer: BC Managed Care – PPO | Admitting: Family Medicine

## 2019-06-24 ENCOUNTER — Other Ambulatory Visit: Payer: Self-pay

## 2019-06-24 DIAGNOSIS — L259 Unspecified contact dermatitis, unspecified cause: Secondary | ICD-10-CM

## 2019-06-24 MED ORDER — PREDNISONE 20 MG PO TABS: ORAL_TABLET | ORAL | 0 refills | Status: DC

## 2019-06-24 NOTE — Progress Notes (Signed)
   Subjective:    Patient ID: Barry Gibson, male    DOB: Feb 02, 1961, 58 y.o.   MRN: [id number]  HPI Pt has poison oak on feet and some on arms. Pt states he started itching on Monday night. Pt did have a blister and he popped it and it drained. Pt has been using Calamine lotion. Pt states the first night, the poison oak woke him up so he poured Clorox on the poison oak; pt states that did help.  Virtual Visit via Video Note  I connected with Barry Gibson on 06/24/19 at 11:30 AM EDT by a video enabled telemedicine application and verified that I am speaking with the correct person using two identifiers.  Location: Patient: home Provider: office   I discussed the limitations of evaluation and management by telemedicine and the availability of in person appointments. The patient expressed understanding and agreed to proceed.  History of Present Illness:    Observations/Objective:   Assessment and Plan:   Follow Up Instructions:    I discussed the assessment and treatment plan with the patient. The patient was provided an opportunity to ask questions and all were answered. The patient agreed with the plan and demonstrated an understanding of the instructions.   The patient was advised to call back or seek an in-person evaluation if the symptoms worsen or if the condition fails to improve as anticipated.  I provided 10 minutes of non-face-to-face time during this encounter.   Vicente Males, LPN     Review of Systems  Constitutional: Negative for activity change, fatigue and fever.  HENT: Negative for congestion and rhinorrhea.   Respiratory: Negative for cough and shortness of breath.   Cardiovascular: Negative for chest pain and leg swelling.  Gastrointestinal: Negative for abdominal pain, diarrhea and nausea.  Genitourinary: Negative for dysuria and hematuria.  Neurological: Negative for weakness and headaches.  Psychiatric/Behavioral: Negative for agitation and  behavioral problems.       Objective:   Physical Exam   Today's visit was via telephone Physical exam was not possible for this visit      Assessment & Plan:  Probable poison ivy poison oak prednisone taper recommend follow-up if progressive troubles  Unfortunately due to a cork in the computer as well as the schedule I was not able to connect with the patient virtually we did this by phone I also made executive decision to not charge him for this visit because he had to wait so long

## 2019-06-24 NOTE — Progress Notes (Signed)
   Subjective:    Patient ID: Barry Gibson, male    DOB: 31-Dec-1960, 58 y.o.   MRN: [id number]  HPI    Review of Systems     Objective:   Physical Exam        Assessment & Plan:

## 2019-07-13 ENCOUNTER — Ambulatory Visit (INDEPENDENT_AMBULATORY_CARE_PROVIDER_SITE_OTHER): Payer: BC Managed Care – PPO | Admitting: Urology

## 2019-07-13 DIAGNOSIS — R3915 Urgency of urination: Secondary | ICD-10-CM

## 2019-07-13 DIAGNOSIS — R350 Frequency of micturition: Secondary | ICD-10-CM | POA: Diagnosis not present

## 2019-07-13 DIAGNOSIS — R3121 Asymptomatic microscopic hematuria: Secondary | ICD-10-CM

## 2019-07-16 ENCOUNTER — Other Ambulatory Visit: Payer: Self-pay | Admitting: Urology

## 2019-07-16 ENCOUNTER — Other Ambulatory Visit (HOSPITAL_COMMUNITY): Payer: Self-pay | Admitting: Urology

## 2019-07-16 DIAGNOSIS — R3121 Asymptomatic microscopic hematuria: Secondary | ICD-10-CM

## 2019-07-20 DIAGNOSIS — Z79899 Other long term (current) drug therapy: Secondary | ICD-10-CM | POA: Diagnosis not present

## 2019-07-20 DIAGNOSIS — M150 Primary generalized (osteo)arthritis: Secondary | ICD-10-CM | POA: Diagnosis not present

## 2019-07-20 DIAGNOSIS — M722 Plantar fascial fibromatosis: Secondary | ICD-10-CM | POA: Diagnosis not present

## 2019-07-20 DIAGNOSIS — M0579 Rheumatoid arthritis with rheumatoid factor of multiple sites without organ or systems involvement: Secondary | ICD-10-CM | POA: Diagnosis not present

## 2019-07-21 ENCOUNTER — Other Ambulatory Visit: Payer: Self-pay

## 2019-07-21 ENCOUNTER — Ambulatory Visit (HOSPITAL_COMMUNITY)
Admission: RE | Admit: 2019-07-21 | Discharge: 2019-07-21 | Disposition: A | Discharge status: Home / Self Care | Payer: BC Managed Care – PPO | Source: Ambulatory Visit | Attending: Urology | Admitting: Urology

## 2019-07-21 DIAGNOSIS — R3129 Other microscopic hematuria: Secondary | ICD-10-CM | POA: Diagnosis not present

## 2019-07-21 DIAGNOSIS — R3121 Asymptomatic microscopic hematuria: Secondary | ICD-10-CM | POA: Diagnosis not present

## 2019-07-21 LAB — POCT I-STAT CREATININE: Creatinine, Ser: 1.1 mg/dL (ref 0.61–1.24)

## 2019-07-21 MED ORDER — IOHEXOL 300 MG/ML  SOLN
125.0000 mL | Freq: Once | INTRAMUSCULAR | Status: AC | PRN
  Administered 2019-07-21: 125 mL via INTRAVENOUS

## 2019-07-29 ENCOUNTER — Other Ambulatory Visit: Payer: Self-pay | Admitting: Urology

## 2019-08-02 ENCOUNTER — Other Ambulatory Visit: Payer: Self-pay

## 2019-08-02 ENCOUNTER — Other Ambulatory Visit (HOSPITAL_COMMUNITY)
Admission: RE | Admit: 2019-08-02 | Discharge: 2019-08-02 | Disposition: A | Discharge status: Home / Self Care | Payer: BC Managed Care – PPO | Source: Ambulatory Visit | Attending: Urology | Admitting: Urology

## 2019-08-02 DIAGNOSIS — Z01812 Encounter for preprocedural laboratory examination: Secondary | ICD-10-CM | POA: Insufficient documentation

## 2019-08-02 DIAGNOSIS — Z20828 Contact with and (suspected) exposure to other viral communicable diseases: Secondary | ICD-10-CM | POA: Diagnosis not present

## 2019-08-02 LAB — SARS CORONAVIRUS 2 (TAT 6-24 HRS): SARS Coronavirus 2: NEGATIVE

## 2019-08-03 ENCOUNTER — Encounter (HOSPITAL_BASED_OUTPATIENT_CLINIC_OR_DEPARTMENT_OTHER): Payer: Self-pay | Admitting: *Deleted

## 2019-08-03 ENCOUNTER — Other Ambulatory Visit: Payer: Self-pay

## 2019-08-03 NOTE — Progress Notes (Signed)
Spoke with patient and wife via telephone for pre op interview. NPO after MN. Patient to take Prilosec with a sip of water AM of surgery. Arrival time 28.

## 2019-08-05 ENCOUNTER — Ambulatory Visit (HOSPITAL_BASED_OUTPATIENT_CLINIC_OR_DEPARTMENT_OTHER)
Admission: RE | Admit: 2019-08-05 | Discharge: 2019-08-05 | Disposition: A | Discharge status: Home / Self Care | Payer: BC Managed Care – PPO | Attending: Urology | Admitting: Urology

## 2019-08-05 ENCOUNTER — Ambulatory Visit (HOSPITAL_BASED_OUTPATIENT_CLINIC_OR_DEPARTMENT_OTHER): Payer: BC Managed Care – PPO | Admitting: Anesthesiology

## 2019-08-05 ENCOUNTER — Encounter (HOSPITAL_BASED_OUTPATIENT_CLINIC_OR_DEPARTMENT_OTHER): Payer: Self-pay

## 2019-08-05 ENCOUNTER — Encounter (HOSPITAL_BASED_OUTPATIENT_CLINIC_OR_DEPARTMENT_OTHER)
Admission: RE | Disposition: A | Discharge status: Home / Self Care | Payer: Self-pay | Source: Home / Self Care | Attending: Urology

## 2019-08-05 DIAGNOSIS — C674 Malignant neoplasm of posterior wall of bladder: Secondary | ICD-10-CM | POA: Insufficient documentation

## 2019-08-05 DIAGNOSIS — M069 Rheumatoid arthritis, unspecified: Secondary | ICD-10-CM | POA: Insufficient documentation

## 2019-08-05 DIAGNOSIS — I10 Essential (primary) hypertension: Secondary | ICD-10-CM | POA: Insufficient documentation

## 2019-08-05 DIAGNOSIS — Z947 Corneal transplant status: Secondary | ICD-10-CM | POA: Insufficient documentation

## 2019-08-05 DIAGNOSIS — Z87891 Personal history of nicotine dependence: Secondary | ICD-10-CM | POA: Diagnosis not present

## 2019-08-05 DIAGNOSIS — D494 Neoplasm of unspecified behavior of bladder: Secondary | ICD-10-CM | POA: Diagnosis not present

## 2019-08-05 DIAGNOSIS — Z888 Allergy status to other drugs, medicaments and biological substances status: Secondary | ICD-10-CM | POA: Diagnosis not present

## 2019-08-05 DIAGNOSIS — C672 Malignant neoplasm of lateral wall of bladder: Secondary | ICD-10-CM | POA: Insufficient documentation

## 2019-08-05 DIAGNOSIS — C678 Malignant neoplasm of overlapping sites of bladder: Secondary | ICD-10-CM

## 2019-08-05 DIAGNOSIS — C679 Malignant neoplasm of bladder, unspecified: Secondary | ICD-10-CM | POA: Diagnosis not present

## 2019-08-05 DIAGNOSIS — E785 Hyperlipidemia, unspecified: Secondary | ICD-10-CM | POA: Diagnosis not present

## 2019-08-05 DIAGNOSIS — K219 Gastro-esophageal reflux disease without esophagitis: Secondary | ICD-10-CM | POA: Diagnosis not present

## 2019-08-05 HISTORY — DX: Gastro-esophageal reflux disease without esophagitis: K21.9

## 2019-08-05 HISTORY — DX: Unspecified cataract: H26.9

## 2019-08-05 HISTORY — PX: TRANSURETHRAL RESECTION OF BLADDER TUMOR WITH MITOMYCIN-C: SHX6459

## 2019-08-05 HISTORY — DX: Rheumatoid arthritis, unspecified: M06.9

## 2019-08-05 HISTORY — DX: Presence of dental prosthetic device (complete) (partial): Z97.2

## 2019-08-05 HISTORY — DX: Complete loss of teeth, unspecified cause, unspecified class: K08.109

## 2019-08-05 LAB — POCT I-STAT, CHEM 8
BUN: 26 mg/dL — ABNORMAL HIGH (ref 6–20)
Calcium, Ion: 1.09 mmol/L — ABNORMAL LOW (ref 1.15–1.40)
Chloride: 105 mmol/L (ref 98–111)
Creatinine, Ser: 0.9 mg/dL (ref 0.61–1.24)
Glucose, Bld: 104 mg/dL — ABNORMAL HIGH (ref 70–99)
HCT: 47 % (ref 39.0–52.0)
Hemoglobin: 16 g/dL (ref 13.0–17.0)
Potassium: 3.6 mmol/L (ref 3.5–5.1)
Sodium: 141 mmol/L (ref 135–145)
TCO2: 22 mmol/L (ref 22–32)

## 2019-08-05 SURGERY — TRANSURETHRAL RESECTION OF BLADDER TUMOR WITH MITOMYCIN-C
Anesthesia: General | Site: Bladder

## 2019-08-05 MED ORDER — CEFAZOLIN SODIUM-DEXTROSE 2-4 GM/100ML-% IV SOLN
2.0000 g | INTRAVENOUS | Status: AC
  Administered 2019-08-05: 10:00:00 2 g via INTRAVENOUS
  Filled 2019-08-05: qty 100

## 2019-08-05 MED ORDER — ONDANSETRON HCL 4 MG/2ML IJ SOLN
INTRAMUSCULAR | Status: AC
  Filled 2019-08-05: qty 2

## 2019-08-05 MED ORDER — OXYCODONE HCL 5 MG/5ML PO SOLN
5.0000 mg | Freq: Once | ORAL | Status: DC | PRN
  Filled 2019-08-05: qty 5

## 2019-08-05 MED ORDER — CEFAZOLIN SODIUM-DEXTROSE 2-4 GM/100ML-% IV SOLN
INTRAVENOUS | Status: AC
  Filled 2019-08-05: qty 100

## 2019-08-05 MED ORDER — LACTATED RINGERS IV SOLN
INTRAVENOUS | Status: DC
  Administered 2019-08-05: 08:00:00 via INTRAVENOUS
  Filled 2019-08-05: qty 1000

## 2019-08-05 MED ORDER — ONDANSETRON HCL 4 MG/2ML IJ SOLN
INTRAMUSCULAR | Status: DC | PRN
  Administered 2019-08-05: 4 mg via INTRAVENOUS

## 2019-08-05 MED ORDER — LIDOCAINE 2% (20 MG/ML) 5 ML SYRINGE
INTRAMUSCULAR | Status: DC | PRN
  Administered 2019-08-05: 100 mg via INTRAVENOUS

## 2019-08-05 MED ORDER — FENTANYL CITRATE (PF) 100 MCG/2ML IJ SOLN
INTRAMUSCULAR | Status: DC | PRN
  Administered 2019-08-05: 100 ug via INTRAVENOUS

## 2019-08-05 MED ORDER — GEMCITABINE CHEMO FOR BLADDER INSTILLATION 2000 MG: 2000.0000 mg | Freq: Once | INTRAVENOUS | Status: AC

## 2019-08-05 MED ORDER — OXYCODONE HCL 5 MG PO TABS
5.0000 mg | ORAL_TABLET | Freq: Once | ORAL | Status: DC | PRN
  Filled 2019-08-05: qty 1

## 2019-08-05 MED ORDER — LIDOCAINE 2% (20 MG/ML) 5 ML SYRINGE
INTRAMUSCULAR | Status: AC
  Filled 2019-08-05: qty 5

## 2019-08-05 MED ORDER — FENTANYL CITRATE (PF) 100 MCG/2ML IJ SOLN
INTRAMUSCULAR | Status: AC
  Filled 2019-08-05: qty 2

## 2019-08-05 MED ORDER — DEXAMETHASONE SODIUM PHOSPHATE 10 MG/ML IJ SOLN
INTRAMUSCULAR | Status: AC
  Filled 2019-08-05: qty 1

## 2019-08-05 MED ORDER — OXYBUTYNIN CHLORIDE 5 MG PO TABS: 5.0000 mg | ORAL_TABLET | Freq: Three times a day (TID) | ORAL | 1 refills | Status: DC | PRN

## 2019-08-05 MED ORDER — DEXAMETHASONE SODIUM PHOSPHATE 10 MG/ML IJ SOLN
INTRAMUSCULAR | Status: DC | PRN
  Administered 2019-08-05: 10 mg via INTRAVENOUS

## 2019-08-05 MED ORDER — SODIUM CHLORIDE 0.9 % IR SOLN
Status: DC | PRN
  Administered 2019-08-05: 6000 mL via INTRAVESICAL

## 2019-08-05 MED ORDER — PROPOFOL 10 MG/ML IV BOLUS
INTRAVENOUS | Status: DC | PRN
  Administered 2019-08-05: 160 mg via INTRAVENOUS

## 2019-08-05 MED ORDER — PROPOFOL 10 MG/ML IV BOLUS
INTRAVENOUS | Status: AC
  Filled 2019-08-05: qty 20

## 2019-08-05 MED ORDER — MIDAZOLAM HCL 2 MG/2ML IJ SOLN
INTRAMUSCULAR | Status: AC
  Filled 2019-08-05: qty 2

## 2019-08-05 MED ORDER — MIDAZOLAM HCL 5 MG/5ML IJ SOLN
INTRAMUSCULAR | Status: DC | PRN
  Administered 2019-08-05: 2 mg via INTRAVENOUS

## 2019-08-05 MED ORDER — FENTANYL CITRATE (PF) 100 MCG/2ML IJ SOLN
25.0000 ug | INTRAMUSCULAR | Status: DC | PRN
  Administered 2019-08-05 (×2): 25 ug via INTRAVENOUS
  Filled 2019-08-05: qty 1

## 2019-08-05 MED ORDER — KETOROLAC TROMETHAMINE 30 MG/ML IJ SOLN
30.0000 mg | Freq: Once | INTRAMUSCULAR | Status: DC | PRN
  Filled 2019-08-05: qty 1

## 2019-08-05 MED ORDER — PROMETHAZINE HCL 25 MG/ML IJ SOLN
6.2500 mg | INTRAMUSCULAR | Status: DC | PRN
  Filled 2019-08-05: qty 1

## 2019-08-05 SURGICAL SUPPLY — 26 items
BAG DRAIN URO-CYSTO SKYTR STRL (DRAIN) ×2 IMPLANT
BAG URINE DRAINAGE (UROLOGICAL SUPPLIES) ×1 IMPLANT
BAG URINE LEG 500ML (DRAIN) IMPLANT
CATH FOLEY 2WAY SLVR  5CC 20FR (CATHETERS) ×1
CATH FOLEY 2WAY SLVR  5CC 22FR (CATHETERS)
CATH FOLEY 2WAY SLVR 5CC 20FR (CATHETERS) IMPLANT
CATH FOLEY 2WAY SLVR 5CC 22FR (CATHETERS) IMPLANT
CATH FOLEY 3WAY 30CC 22F (CATHETERS) IMPLANT
CLOTH BEACON ORANGE TIMEOUT ST (SAFETY) ×1 IMPLANT
ELECT REM PT RETURN 9FT ADLT (ELECTROSURGICAL)
ELECTRODE REM PT RTRN 9FT ADLT (ELECTROSURGICAL) ×1 IMPLANT
EVACUATOR MICROVAS BLADDER (UROLOGICAL SUPPLIES) IMPLANT
GLOVE BIO SURGEON STRL SZ8 (GLOVE) ×3 IMPLANT
GOWN STRL REUS W/TWL XL LVL3 (GOWN DISPOSABLE) ×2 IMPLANT
HOLDER FOLEY CATH W/STRAP (MISCELLANEOUS) ×1 IMPLANT
IV NS IRRIG 3000ML ARTHROMATIC (IV SOLUTION) ×3 IMPLANT
KIT TURNOVER CYSTO (KITS) ×2 IMPLANT
LOOP CUT BIPOLAR 24F LRG (ELECTROSURGICAL) ×2 IMPLANT
MANIFOLD NEPTUNE II (INSTRUMENTS) ×2 IMPLANT
NS IRRIG 500ML POUR BTL (IV SOLUTION) ×2 IMPLANT
PACK CYSTO (CUSTOM PROCEDURE TRAY) ×2 IMPLANT
PLUG CATH AND CAP STER (CATHETERS) IMPLANT
SYRINGE IRR TOOMEY STRL 70CC (SYRINGE) ×2 IMPLANT
TUBE CONNECTING 12X1/4 (SUCTIONS) ×2 IMPLANT
TUBING UROLOGY SET (TUBING) IMPLANT
WATER STERILE IRR 500ML POUR (IV SOLUTION) IMPLANT

## 2019-08-05 NOTE — H&P (Signed)
H&P  Chief Complaint: Bladder tumors  History of Present Illness: 58 yo male presents for TURBT/gemzar placement following for mgmt of recently found bladder tumors. CT revealed no evidence of extravesical disease.  Past Medical History:  Diagnosis Date  . Cataract   . Full dentures   . GERD (gastroesophageal reflux disease)   . Hypertension   . Rheumatoid arthritis [facility])     Past Surgical History:  Procedure Laterality Date  . CORNEAL TRANSPLANT Left 2013    Home Medications:  Allergies as of 08/05/2019      Reactions   Ace Inhibitors       Medication List    Notice   Cannot display discharge medications because the patient has not yet been admitted.     Allergies:  Allergies  Allergen Reactions  . Ace Inhibitors     History reviewed. No pertinent family history.  Social History:  reports that he has quit smoking. His smoking use included cigarettes. He has never used smokeless tobacco. He reports current alcohol use. He reports that he does not use drugs.  ROS: A complete review of systems was performed.  All systems are negative except for pertinent findings as noted.  Physical Exam:  Vital signs in last 24 hours:   Constitutional:  Alert and oriented, No acute distress Cardiovascular: Regular rate  Respiratory: Normal respiratory effort GI: Abdomen is soft, nontender, nondistended, no abdominal masses. No CVAT.  Genitourinary: Normal male phallus, testes are descended bilaterally and non-tender and without masses, scrotum is normal in appearance without lesions or masses, perineum is normal on inspection. Lymphatic: No lymphadenopathy Neurologic: Grossly intact, no focal deficits Psychiatric: Normal mood and affect  Laboratory Data:  No results for input(s): WBC, HGB, HCT, PLT in the last 72 hours.  No results for input(s): NA, K, CL, GLUCOSE, BUN, CALCIUM, CREATININE in the last 72 hours.  Invalid input(s): CO3   No results found for this or any  previous visit (from the past 24 hour(s)). Recent Results (from the past 240 hour(s))  SARS CORONAVIRUS 2 (TAT 6-24 HRS) Nasopharyngeal Nasopharyngeal Swab     Status: None   Collection Time: 08/02/19  7:15 AM   Specimen: Nasopharyngeal Swab  Result Value Ref Range Status   SARS Coronavirus 2 NEGATIVE NEGATIVE Final    Comment: (NOTE) SARS-CoV-2 target nucleic acids are NOT DETECTED. The SARS-CoV-2 RNA is generally detectable in upper and lower respiratory specimens during the acute phase of infection. Negative results do not preclude SARS-CoV-2 infection, do not rule out co-infections with other pathogens, and should not be used as the sole basis for treatment or other patient management decisions. Negative results must be combined with clinical observations, patient history, and epidemiological information. The expected result is Negative. Fact Sheet for Patients: SugarRoll.be Fact Sheet for Healthcare Providers: https://www.woods-mathews.com/ This test is not yet approved or cleared by the [place] FDA and  has been authorized for detection and/or diagnosis of SARS-CoV-2 by FDA under an Emergency Use Authorization (EUA). This EUA will remain  in effect (meaning this test can be used) for the duration of the COVID-19 declaration under Section 56 4(b)(1) of the Act, 21 U.S.C. section 360bbb-3(b)(1), unless the authorization is terminated or revoked sooner. Performed at [facility], [place] [address], [place], [place] [address]     Renal Function: No results for input(s): CREATININE in the last 168 hours. Estimated Creatinine Clearance: 92.8 mL/min (by C-G formula based on SCr of 1.1 mg/dL).  Radiologic Imaging: No  results found.  Impression/Assessment:  Bladder cancer  Plan:  TURBT,possible placement of intravesical gemcitabine

## 2019-08-05 NOTE — Anesthesia Postprocedure Evaluation (Signed)
Anesthesia Post Note  Patient: Barry Gibson  Procedure(s) Performed: TRANSURETHRAL RESECTION OF BLADDER TUMOR (N/A Bladder)     Patient location during evaluation: PACU Anesthesia Type: General Level of consciousness: awake and alert Pain management: pain level controlled Vital Signs Assessment: post-procedure vital signs reviewed and stable Respiratory status: spontaneous breathing, nonlabored ventilation, respiratory function stable and patient connected to nasal cannula oxygen Cardiovascular status: blood pressure returned to baseline and stable Postop Assessment: no apparent nausea or vomiting Anesthetic complications: no    Last Vitals:  Vitals:   08/05/19 1045 08/05/19 1051  BP: 138/88   Pulse: 74 72  Resp: 10 13  Temp:    SpO2: 97% 99%    Last Pain:  Vitals:   08/05/19 1033  TempSrc:   PainSc: 0-No pain                 Jerzee Jerome S

## 2019-08-05 NOTE — Interval H&P Note (Signed)
History and Physical Interval Note:  08/05/2019 9:35 AM  Barry Gibson  has presented today for surgery, with the diagnosis of BLADDER CANCER.  The various methods of treatment have been discussed with the patient and family. After consideration of risks, benefits and other options for treatment, the patient has consented to  Procedure(s) with comments: TRANSURETHRAL RESECTION OF BLADDER TUMOR WITH MITOMYCIN-C (N/A) - 1 HR as a surgical intervention.  The patient's history has been reviewed, patient examined, no change in status, stable for surgery.  I have reviewed the patient's chart and labs.  Questions were answered to the patient's satisfaction.     Lillette Boxer Marge Vandermeulen

## 2019-08-05 NOTE — Transfer of Care (Signed)
Immediate Anesthesia Transfer of Care Note  Patient: Barry Gibson  Procedure(s) Performed: TRANSURETHRAL RESECTION OF BLADDER TUMOR (N/A Bladder)  Patient Location: PACU  Anesthesia Type:General  Level of Consciousness: awake, alert  and oriented  Airway & Oxygen Therapy: Patient Spontanous Breathing and Patient connected to face mask oxygen  Post-op Assessment: Report given to RN and Post -op Vital signs reviewed and stable  Post vital signs: Reviewed and stable  Last Vitals:  Vitals Value Taken Time  BP    Temp    Pulse 76 08/05/19 1035  Resp 11 08/05/19 1035  SpO2 98 % 08/05/19 1035  Vitals shown include unvalidated device data.  Last Pain:  Vitals:   08/05/19 0816  TempSrc: Oral  PainSc: 0-No pain      Patients Stated Pain Goal: 6 ([id number])  Complications: No apparent anesthesia complications

## 2019-08-05 NOTE — Anesthesia Preprocedure Evaluation (Signed)
Anesthesia Evaluation  Patient identified by MRN, date of birth, ID band Patient awake    Reviewed: Allergy & Precautions, NPO status , Patient's Chart, lab work & pertinent test results  Airway Mallampati: II  TM Distance: >3 FB Neck ROM: Full    Dental no notable dental hx.    Pulmonary neg pulmonary ROS, former smoker,    Pulmonary exam normal breath sounds clear to auscultation       Cardiovascular hypertension, Pt. on medications Normal cardiovascular exam Rhythm:Regular Rate:Normal     Neuro/Psych negative neurological ROS  negative psych ROS   GI/Hepatic Neg liver ROS, GERD  Medicated,  Endo/Other  negative endocrine ROS  Renal/GU negative Renal ROS  negative genitourinary   Musculoskeletal  (+) Arthritis , Rheumatoid disorders,    Abdominal   Peds negative pediatric ROS (+)  Hematology negative hematology ROS (+)   Anesthesia Other Findings   Reproductive/Obstetrics negative OB ROS                             Anesthesia Physical Anesthesia Plan  ASA: III  Anesthesia Plan: General   Post-op Pain Management:    Induction: Intravenous  PONV Risk Score and Plan: 2 and Ondansetron, Dexamethasone and Treatment may vary due to age or medical condition  Airway Management Planned: LMA  Additional Equipment:   Intra-op Plan:   Post-operative Plan: Extubation in OR  Informed Consent: I have reviewed the patients History and Physical, chart, labs and discussed the procedure including the risks, benefits and alternatives for the proposed anesthesia with the patient or authorized representative who has indicated his/her understanding and acceptance.     Dental advisory given  Plan Discussed with: CRNA and Surgeon  Anesthesia Plan Comments: (ETT if surgeon needs relaxation)        Anesthesia Quick Evaluation

## 2019-08-05 NOTE — Anesthesia Procedure Notes (Signed)
Procedure Name: LMA Insertion Date/Time: 08/05/2019 9:47 AM Performed by: Iviana Blasingame D, CRNA Pre-anesthesia Checklist: Patient identified, Emergency Drugs available, Suction available and Patient being monitored Patient Re-evaluated:Patient Re-evaluated prior to induction Oxygen Delivery Method: Circle system utilized Preoxygenation: Pre-oxygenation with 100% oxygen Induction Type: IV induction Ventilation: Mask ventilation without difficulty LMA: LMA inserted LMA Size: 5.0 Tube type: Oral Number of attempts: 1 Placement Confirmation: positive ETCO2 and breath sounds checked- equal and bilateral Tube secured with: Tape Dental Injury: Teeth and Oropharynx as per pre-operative assessment

## 2019-08-05 NOTE — Discharge Instructions (Signed)
°  Post Anesthesia Home Care Instructions  Activity: Get plenty of rest for the remainder of the day. A responsible individual must stay with you for 24 hours following the procedure.  For the next 24 hours, DO NOT: -Drive a car -Paediatric nurse -Drink alcoholic beverages -Take any medication unless instructed by your physician -Make any legal decisions or sign important papers.  Meals: Start with liquid foods such as gelatin or soup. Progress to regular foods as tolerated. Avoid greasy, spicy, heavy foods. If nausea and/or vomiting occur, drink only clear liquids until the nausea and/or vomiting subsides. Call your physician if vomiting continues.  Special Instructions/Symptoms: Your throat may feel dry or sore from the anesthesia or the breathing tube placed in your throat during surgery. If this causes discomfort, gargle with warm salt water. The discomfort should disappear within 24 hours.  If you had a scopolamine patch placed behind your ear for the management of post- operative nausea and/or vomiting:  1. The medication in the patch is effective for 72 hours, after which it should be removed.  Wrap patch in a tissue and discard in the trash. Wash hands thoroughly with soap and water. 2. You may remove the patch earlier than 72 hours if you experience unpleasant side effects which may include dry mouth, dizziness or visual disturbances. 3. Avoid touching the patch. Wash your hands with soap and water after contact with the patch.    1. You may see some blood in the urine and may have some burning with urination for 48-72 hours. You also may notice that you have to urinate more frequently or urgently after your procedure which is normal.  2. You should call should you develop an inability urinate, fever > 101, persistent nausea and vomiting that prevents you from eating or drinking to stay hydrated.  3. If you have a stent, you will likely urinate more frequently and urgently until the  stent is removed and you may experience some discomfort/pain in the lower abdomen and flank especially when urinating. You may take pain medication prescribed to you if needed for pain. You may also intermittently have blood in the urine until the stent is removed. 4. If you have a catheter, you will be taught how to take care of the catheter by the nursing staff prior to discharge from the hospital.  You may periodically feel a strong urge to void with the catheter in place.  This is a bladder spasm and most often can occur when having a bowel movement or moving around. It is typically self-limited and usually will stop after a few minutes.  You may use some Vaseline or Neosporin around the tip of the catheter to reduce friction at the tip of the penis. You may also see some blood in the urine.  A very small amount of blood can make the urine look quite red.  As long as the catheter is draining well, there usually is not a problem.  However, if the catheter is not draining well and is bloody, you should call the office [phone]) to notify us.             It is okay to remove the catheter as instructed on Saturday morning.

## 2019-08-05 NOTE — Op Note (Signed)
Preoperative diagnosis: Multifocal bladder tumors, largest diameter 3 cm  Postoperative diagnosis: Same  Principal procedure: Cystoscopy, transurethral resection of multiple bladder tumors, largest diameter 3 cm  Surgeon: Ritha Sampedro  Anesthesia: General with LMA  Complications: None  Specimen: Bladder tumor fragments, to pathology  Drains: 25 French Foley catheter to straight drainage  Estimated blood loss: Less than 10 mL  Indications: 58 year old white male with recent hematuria, lower urinary tract symptoms, with cystoscopic findings of bladder tumors.  CT hematuria protocol revealed no upper tract lesions or pathology.  He presents at this time for cystoscopy, TURBT, possible placement of gemcitabine.  I discussed the procedure with the patient including possible complications which include bladder perforation, infection, bleeding, need for catheterization afterwards, anesthetic complications, among others.  He accepts these and desires to proceed.  Findings: Bladder neck was high riding.  Prostate was nonobstructive.  On the left bladder neck/prostatic area, there was nodular/papillary lesion approximately 2 cm in size.  There was a papillary lesion on the left lateral wall approximately 2 cm.  2 lesions on the superior/posterior bladder wall.  These were somewhat difficult to treat due to the relatively long urethra and the patient's size.  These 2 lesions were somewhat cobblestone in appearance.  The largest of these were 3 cm in size.  Low-lying papillary lesions near the left lateral wall bladder tumor additionally.  Generalized hypervascularity.  Description of procedure: The patient was properly identified in the holding area.  He is taken to the operating room where general anesthetic was administered with the LMA.  He is placed in the dorsolithotomy position.  Genitalia and perineum were prepped and draped.  Proper timeout was performed.  Urethral meatus dilated to 28 [place] with  [employer] sounds.  The resectoscope sheath was passed with the visual obturator.  Somewhat difficult to get into the bladder due to the high riding bladder neck.  Using the 12 degree lens, general inspection of the bladder was performed with the findings noted above.  Ureteral orifice ease were normal in configuration and location and were well away from any bladder pathology.  The resectoscope and a cutting loop was then placed.  Using the bipolar apparatus, the above bladder tumors were resected, easily into the muscular layer.  The low-lying papillary lesions on the left bladder wall were simply cauterized.  There was an obturator reflex on the resection of the bladder wall tumor on the left.  A small perforation was noted as a result.  Following resection of all abnormal urothelium, and adequate hemostasis, the bladder tumor fragments were irrigated from the bladder and sent for permanent pathology labeled "bladder tumor".  Excellent hemostasis was assured with a couple of looks around the bladder with irrigation turned off.  At this point, the scope was removed.  A 20 French Foley catheter was then placed in the balloon filled with 10 cc of water.  This was hooked to dependent drainage.  Because of the small perforation of the left bladder wall I did not place gemcitabine.  Patient was then awakened, taken to the PACU in stable condition.  He tolerated the procedure well.

## 2019-08-06 ENCOUNTER — Encounter (HOSPITAL_BASED_OUTPATIENT_CLINIC_OR_DEPARTMENT_OTHER): Payer: Self-pay | Admitting: Urology

## 2019-08-06 LAB — SURGICAL PATHOLOGY

## 2019-08-07 ENCOUNTER — Emergency Department (HOSPITAL_COMMUNITY)
Admission: EM | Admit: 2019-08-07 | Discharge: 2019-08-07 | Disposition: A | Discharge status: Home / Self Care | Payer: BC Managed Care – PPO | Attending: Emergency Medicine | Admitting: Emergency Medicine

## 2019-08-07 ENCOUNTER — Encounter (HOSPITAL_COMMUNITY): Payer: Self-pay

## 2019-08-07 ENCOUNTER — Other Ambulatory Visit: Payer: Self-pay

## 2019-08-07 DIAGNOSIS — I10 Essential (primary) hypertension: Secondary | ICD-10-CM | POA: Insufficient documentation

## 2019-08-07 DIAGNOSIS — Z87891 Personal history of nicotine dependence: Secondary | ICD-10-CM | POA: Diagnosis not present

## 2019-08-07 DIAGNOSIS — R339 Retention of urine, unspecified: Secondary | ICD-10-CM | POA: Diagnosis not present

## 2019-08-07 DIAGNOSIS — M069 Rheumatoid arthritis, unspecified: Secondary | ICD-10-CM | POA: Insufficient documentation

## 2019-08-07 DIAGNOSIS — Z79899 Other long term (current) drug therapy: Secondary | ICD-10-CM | POA: Diagnosis not present

## 2019-08-07 LAB — CBC WITH DIFFERENTIAL/PLATELET
Abs Immature Granulocytes: 0.11 10*3/uL — ABNORMAL HIGH (ref 0.00–0.07)
Basophils Absolute: 0.1 10*3/uL (ref 0.0–0.1)
Basophils Relative: 1 %
Eosinophils Absolute: 0 10*3/uL (ref 0.0–0.5)
Eosinophils Relative: 0 %
HCT: 46 % (ref 39.0–52.0)
Hemoglobin: 15.1 g/dL (ref 13.0–17.0)
Immature Granulocytes: 1 %
Lymphocytes Relative: 10 %
Lymphs Abs: 1.8 10*3/uL (ref 0.7–4.0)
MCH: 30.2 pg (ref 26.0–34.0)
MCHC: 32.8 g/dL (ref 30.0–36.0)
MCV: 92 fL (ref 80.0–100.0)
Monocytes Absolute: 1.4 10*3/uL — ABNORMAL HIGH (ref 0.1–1.0)
Monocytes Relative: 8 %
Neutro Abs: 15.2 10*3/uL — ABNORMAL HIGH (ref 1.7–7.7)
Neutrophils Relative %: 80 %
Platelets: 193 10*3/uL (ref 150–400)
RBC: 5 MIL/uL (ref 4.22–5.81)
RDW: 13.6 % (ref 11.5–15.5)
WBC: 18.6 10*3/uL — ABNORMAL HIGH (ref 4.0–10.5)
nRBC: 0 % (ref 0.0–0.2)

## 2019-08-07 LAB — URINALYSIS, ROUTINE W REFLEX MICROSCOPIC
Bacteria, UA: NONE SEEN
Bilirubin Urine: NEGATIVE
Glucose, UA: NEGATIVE mg/dL
Ketones, ur: NEGATIVE mg/dL
Nitrite: NEGATIVE
Protein, ur: 100 mg/dL — AB
RBC / HPF: >50 RBC/hpf — ABNORMAL HIGH (ref 0–5)
Specific Gravity, Urine: 1.013 (ref 1.005–1.030)
pH: 6 (ref 5.0–8.0)

## 2019-08-07 LAB — BASIC METABOLIC PANEL
Anion gap: 12 (ref 5–15)
BUN: 26 mg/dL — ABNORMAL HIGH (ref 6–20)
CO2: 24 mmol/L (ref 22–32)
Calcium: 8.7 mg/dL — ABNORMAL LOW (ref 8.9–10.3)
Chloride: 100 mmol/L (ref 98–111)
Creatinine, Ser: 1.2 mg/dL (ref 0.61–1.24)
GFR calc Af Amer: >60 mL/min (ref >60)
GFR calc non Af Amer: >60 mL/min (ref >60)
Glucose, Bld: 115 mg/dL — ABNORMAL HIGH (ref 70–99)
Potassium: 3.3 mmol/L — ABNORMAL LOW (ref 3.5–5.1)
Sodium: 136 mmol/L (ref 135–145)

## 2019-08-07 MED ORDER — LIDOCAINE HCL URETHRAL/MUCOSAL 2 % EX GEL
1.0000 "application " | Freq: Once | CUTANEOUS | Status: AC | PRN
  Administered 2019-08-07: 1 via URETHRAL
  Filled 2019-08-07: qty 5

## 2019-08-07 NOTE — ED Triage Notes (Signed)
Pt reports bladder surgery on Thursday and states that he had the catheter removed last night. States that he has urinated without problem until about 8 hours ago. Ambulatory.

## 2019-08-07 NOTE — ED Notes (Signed)
Patient's catheter was irrigated, no issues or clots noted. Pink-tinged urine returned. Bladder scan showed 0 mL of urine in bladder, urine freely flowing from catheter.

## 2019-08-07 NOTE — Treatment Plan (Signed)
I spoke with patient who is s/p TURBT and his wife earlier today as he developed AUR after initially being able to void after catheter removal. Advised he go to ED if still unable to void after eight hours. ED physician later called me to advise patient presented with bladder scan 800 cc and pressure greatly relieved with in and out catheterization; unfortunately catheter removed.   Plan to place indwelling catheter, irrigate to ensure no clot retention, repeat bladder scan to ensure bladder decompressed. If catheter flushes and drains well with minimal hematuria then okay to discharge home with catheter to drainage. Patient will be advised to call office Monday morning to set up appointment this week for catheter removal and trial of void.  I attempted to call wife after speaking ED physician as well, but no answer so I left voicemail regarding follow up plan and callback instructions for Monday morning as well.   Greatly appreciate ED team help with this patient's care.  [facility]

## 2019-08-07 NOTE — Discharge Instructions (Addendum)
Take your usual prescriptions as previously directed.  Call your regular Urologist on Monday to schedule a follow up appointment this week.  Return to the Emergency Department immediately sooner if worsening.

## 2019-08-07 NOTE — ED Provider Notes (Signed)
[facility] DEPT Provider Note   CSN: [id number] Arrival date & time: 08/07/19  1932     History   Chief Complaint Chief Complaint  Patient presents with  . Urinary Retention    HPI Barry Gibson is a 58 y.o. male.     HPI  Pt was seen at 2035. Per pt and his wife, c/o gradual onset and persistence of constant urinary retention since 11am today. Pt states he had "bladder tumor surgery" 2 days ago, and removed his foley catheter last night. Pt states he was urinating "fine" up until 11am today. Pt c/o suprapubic discomfort. States his urine is "pink tinged." Denies back pain, no fevers, no rash, no injury, no dysuria, no N/V/D, no testicular pain/swelling, no CP/SOB.      Past Medical History:  Diagnosis Date  . Cataract   . Full dentures   . GERD (gastroesophageal reflux disease)   . Hypertension   . Rheumatoid arthritis [facility])     Patient Active Problem List   Diagnosis Date Noted  . Rheumatoid arthritis ([place]) 04/07/2018  . Prostate hypertrophy 07/15/2017  . Elevated fasting blood sugar 11/05/2016  . Essential hypertension, benign 02/05/2013  . Hyperlipidemia 02/04/2013    Past Surgical History:  Procedure Laterality Date  . CORNEAL TRANSPLANT Left 2013  . TRANSURETHRAL RESECTION OF BLADDER TUMOR WITH MITOMYCIN-C N/A 08/05/2019   Procedure: TRANSURETHRAL RESECTION OF BLADDER TUMOR;  Surgeon: Franchot Gallo, MD;  Location: [facility];  Service: Urology;  Laterality: N/A;  1 HR        Home Medications    Prior to Admission medications   Medication Sig Start Date End Date Taking? Authorizing Provider  acetaminophen (TYLENOL) 650 MG CR tablet Take 650 mg by mouth every 8 (eight) hours as needed for pain.   Yes [provider]  chlorthalidone (HYGROTON) 25 MG tablet Take 0.5 tablets (12.5 mg total) by mouth daily. 01/07/19  Yes Mikey Kirschner, MD  finasteride (PROSCAR) 5 MG tablet Take 5 mg by mouth  daily.   Yes [provider]  HUMIRA PEN 40 MG/0.4ML PNKT Inject 40 mg into the skin every 14 (fourteen) days. 07/26/19  Yes [provider]  leflunomide (ARAVA) 10 MG tablet Take 10 mg by mouth daily.   Yes [provider]  losartan (COZAAR) 100 MG tablet Take 1 tablet (100 mg total) by mouth daily. 05/18/19  Yes Mikey Kirschner, MD  omeprazole (PRILOSEC) 40 MG capsule Take 40 mg by mouth daily.   Yes [provider]  oxybutynin (DITROPAN) 5 MG tablet Take 1 tablet (5 mg total) by mouth every 8 (eight) hours as needed for up to 15 doses for bladder spasms. 08/05/19  Yes Dahlstedt, Annie Main, MD  pravastatin (PRAVACHOL) 40 MG tablet Take 1 tablet (40 mg total) by mouth daily. 05/18/19  Yes Mikey Kirschner, MD  prednisoLONE acetate (PRED FORTE) 1 % ophthalmic suspension Place 1 drop into both eyes 4 (four) times daily.   Yes [provider]  sildenafil (REVATIO) 20 MG tablet Take two to three tablet by mouth 2 hours prior to sex 10/29/18  Yes Mikey Kirschner, MD  tamsulosin (FLOMAX) 0.4 MG CAPS capsule Take 2 capsules (0.8 mg total) by mouth daily. 05/18/19  Yes Mikey Kirschner, MD  leflunomide (ARAVA) 20 MG tablet Take 20 mg by mouth daily. 07/27/19   [provider]    Family History History reviewed. No pertinent family history.  Social History Social  History   Tobacco Use  . Smoking status: Former Smoker    Types: Cigarettes  . Smokeless tobacco: Never Used  Substance Use Topics  . Alcohol use: Yes  . Drug use: No     Allergies   Ace inhibitors   Review of Systems Review of Systems ROS: Statement: All systems negative except as marked or noted in the HPI; Constitutional: Negative for fever and chills. ; ; Eyes: Negative for eye pain, redness and discharge. ; ; ENMT: Negative for ear pain, hoarseness, nasal congestion, sinus pressure and sore throat. ; ; Cardiovascular: Negative for chest pain, palpitations, diaphoresis, dyspnea  and peripheral edema. ; ; Respiratory: Negative for cough, wheezing and stridor. ; ; Gastrointestinal: Negative for nausea, vomiting, diarrhea, abdominal pain, blood in stool, hematemesis, jaundice and rectal bleeding. . ; ; Genitourinary: Negative for dysuria, flank pain and +urinary retention, hematuria. ; ; Genital:  No penile drainage or rash, no testicular pain or swelling, no scrotal rash or swelling. ;; Musculoskeletal: Negative for back pain and neck pain. Negative for swelling and trauma.; ; Skin: Negative for pruritus, rash, abrasions, blisters, bruising and skin lesion.; ; Neuro: Negative for headache, lightheadedness and neck stiffness. Negative for weakness, altered level of consciousness, altered mental status, extremity weakness, paresthesias, involuntary movement, seizure and syncope.       Physical Exam Updated Vital Signs BP (!) 184/112   Pulse 93   Temp 98.2 F (36.8 C) (Oral)   Resp 19   Ht 5\' 11"  (1.803 m)   Wt 107.5 kg   SpO2 97%   BMI 33.05 kg/m    Patient Vitals for the past 24 hrs:  BP Temp Temp src Pulse Resp SpO2 Height Weight  08/07/19 2300 (!) 184/112 - - 93 19 97 % - -  08/07/19 2230 (!) 161/117 - - 84 18 96 % - -  08/07/19 2200 123/84 - - 72 19 94 % - -  08/07/19 2130 138/81 - - 73 17 96 % - -  08/07/19 2100 127/73 - - 71 16 94 % - -  08/07/19 2030 140/78 98.2 F (36.8 C) Oral 73 20 92 % - -  08/07/19 1942 (!) 181/103 97.9 F (36.6 C) Oral 81 16 98 % 5\' 11"  (1.803 m) 107.5 kg     Physical Exam 2040: Physical examination:  Nursing notes reviewed; Vital signs and O2 SAT reviewed;  Constitutional: Well developed, Well nourished, Well hydrated, comfortable appearing after In and Out cath.; Head:  Normocephalic, atraumatic; Eyes: EOMI, PERRL, No scleral icterus; ENMT: Mouth and pharynx normal, Mucous membranes moist; Neck: Supple, Full range of motion, No lymphadenopathy; Cardiovascular: Regular rate and rhythm, No gallop; Respiratory: Breath sounds clear &  equal bilaterally, No wheezes.  Speaking full sentences with ease, Normal respiratory effort/excursion; Chest: Nontender, Movement normal; Abdomen: Soft, Nontender, No distention. Normal bowel sounds; Genitourinary: No CVA tenderness; Extremities: Peripheral pulses normal, No tenderness, No edema, No calf edema or asymmetry.; Neuro: AA&Ox3, Major CN grossly intact.  Speech clear. No gross focal motor or sensory deficits in extremities.; Skin: Color normal, Warm, Dry.   ED Treatments / Results  Labs (all labs ordered are listed, but only abnormal results are displayed)   EKG None  Radiology   Procedures Procedures (including critical care time)  Medications Ordered in ED Medications  lidocaine (XYLOCAINE) 2 % jelly 1 application (1 application Urethral Given 08/07/19 2022)     Initial Impression / Assessment and Plan / ED Course  I have reviewed the  triage vital signs and the nursing notes.  Pertinent labs & imaging results that were available during my care of the patient were reviewed by me and considered in my medical decision making (see chart for details).     MDM Reviewed: previous chart, nursing note and vitals Reviewed previous: labs Interpretation: labs   Results for orders placed or performed during the hospital encounter of 08/07/19  Urinalysis, Routine w reflex microscopic- may I&O cath if menses  Result Value Ref Range   Color, Urine YELLOW YELLOW   APPearance CLOUDY (A) CLEAR   Specific Gravity, Urine 1.013 1.005 - 1.030   pH 6.0 5.0 - 8.0   Glucose, UA NEGATIVE NEGATIVE mg/dL   Hgb urine dipstick LARGE (A) NEGATIVE   Bilirubin Urine NEGATIVE NEGATIVE   Ketones, ur NEGATIVE NEGATIVE mg/dL   Protein, ur 100 (A) NEGATIVE mg/dL   Nitrite NEGATIVE NEGATIVE   Leukocytes,Ua TRACE (A) NEGATIVE   RBC / HPF >50 (H) 0 - 5 RBC/hpf   Bacteria, UA NONE SEEN NONE SEEN  CBC with Differential  Result Value Ref Range   WBC 18.6 (H) 4.0 - 10.5 K/uL   RBC 5.00 4.22 -  5.81 MIL/uL   Hemoglobin 15.1 13.0 - 17.0 g/dL   HCT 46.0 39.0 - 52.0 %   MCV 92.0 80.0 - 100.0 fL   MCH 30.2 26.0 - 34.0 pg   MCHC 32.8 30.0 - 36.0 g/dL   RDW 13.6 11.5 - 15.5 %   Platelets 193 150 - 400 K/uL   nRBC 0.0 0.0 - 0.2 %   Neutrophils Relative % 80 %   Neutro Abs 15.2 (H) 1.7 - 7.7 K/uL   Lymphocytes Relative 10 %   Lymphs Abs 1.8 0.7 - 4.0 K/uL   Monocytes Relative 8 %   Monocytes Absolute 1.4 (H) 0.1 - 1.0 K/uL   Eosinophils Relative 0 %   Eosinophils Absolute 0.0 0.0 - 0.5 K/uL   Basophils Relative 1 %   Basophils Absolute 0.1 0.0 - 0.1 K/uL   Immature Granulocytes 1 %   Abs Immature Granulocytes 0.11 (H) 0.00 - 0.07 K/uL  Basic metabolic panel  Result Value Ref Range   Sodium 136 135 - 145 mmol/L   Potassium 3.3 (L) 3.5 - 5.1 mmol/L   Chloride 100 98 - 111 mmol/L   CO2 24 22 - 32 mmol/L   Glucose, Bld 115 (H) 70 - 99 mg/dL   BUN 26 (H) 6 - 20 mg/dL   Creatinine, Ser 1.20 0.61 - 1.24 mg/dL   Calcium 8.7 (L) 8.9 - 10.3 mg/dL   GFR calc non Af Amer >60 >60 mL/min   GFR calc Af Amer >60 >60 mL/min   Anion gap 12 5 - 15   TRASHON POWNALL was evaluated in Emergency Department on 08/07/2019 for the symptoms described in the history of present illness. He was evaluated in the context of the global COVID-19 pandemic, which necessitated consideration that the patient might be at risk for infection with the SARS-CoV-2 virus that causes COVID-19. Institutional protocols and algorithms that pertain to the evaluation of patients at risk for COVID-19 are in a state of rapid change based on information released by regulatory bodies including the CDC and federal and state organizations. These policies and algorithms were followed during the patient's care in the ED.   2220:  On pt's arrival, ED RN performed bladder scan = 865ml urine and performed in and out cath, given pt's discomfort. Upon my evaluation, pt  much more comfortable, states he "feels so much better now." Abd  soft/NT. Afebrile. WBC count likely d/t demargination. BUN/Cr per baseline. T/C returned from Uro Dr. Graylon Good, case discussed, including:  HPI, pertinent PM/SHx, VS/PE, dx testing, ED course and treatment:  Requests to place either 18 or 77F foley, irrigate, let drain for at least 82min, then re-bladder scan; if <114ml, pt can be d/c to f/u in office this week.   2300:  77F Foley place, urine freely flowing without clots. Bladder scan 25ml. Pt states he feels better and wants to go home now. Dx and testing, as well as d/w Uro MD, d/w pt and family.  Questions answered.  Verb understanding, agreeable to d/c home with outpt f/u.    Final Clinical Impressions(s) / ED Diagnoses   Final diagnoses:  None    ED Discharge Orders    None       Francine Graven, DO 08/11/19 1526

## 2019-08-09 DIAGNOSIS — N401 Enlarged prostate with lower urinary tract symptoms: Secondary | ICD-10-CM | POA: Diagnosis not present

## 2019-08-09 DIAGNOSIS — R338 Other retention of urine: Secondary | ICD-10-CM | POA: Diagnosis not present

## 2019-08-09 DIAGNOSIS — R311 Benign essential microscopic hematuria: Secondary | ICD-10-CM | POA: Diagnosis not present

## 2019-08-09 DIAGNOSIS — C678 Malignant neoplasm of overlapping sites of bladder: Secondary | ICD-10-CM | POA: Diagnosis not present

## 2019-08-09 LAB — URINE CULTURE: Culture: NO GROWTH

## 2019-08-22 ENCOUNTER — Other Ambulatory Visit: Payer: Self-pay | Admitting: Family Medicine

## 2019-08-31 ENCOUNTER — Ambulatory Visit (INDEPENDENT_AMBULATORY_CARE_PROVIDER_SITE_OTHER): Payer: BC Managed Care – PPO | Admitting: Urology

## 2019-08-31 ENCOUNTER — Other Ambulatory Visit: Payer: Self-pay

## 2019-08-31 DIAGNOSIS — C678 Malignant neoplasm of overlapping sites of bladder: Secondary | ICD-10-CM | POA: Diagnosis not present

## 2019-09-03 ENCOUNTER — Other Ambulatory Visit: Payer: Self-pay | Admitting: Urology

## 2019-09-13 ENCOUNTER — Other Ambulatory Visit: Payer: Self-pay

## 2019-09-13 ENCOUNTER — Other Ambulatory Visit (HOSPITAL_COMMUNITY)
Admission: RE | Admit: 2019-09-13 | Discharge: 2019-09-13 | Disposition: A | Discharge status: Home / Self Care | Payer: BC Managed Care – PPO | Source: Ambulatory Visit | Attending: Urology | Admitting: Urology

## 2019-09-13 ENCOUNTER — Encounter (HOSPITAL_COMMUNITY)
Admission: RE | Admit: 2019-09-13 | Discharge: 2019-09-13 | Disposition: A | Discharge status: Home / Self Care | Payer: BC Managed Care – PPO | Source: Ambulatory Visit | Attending: Urology | Admitting: Urology

## 2019-09-13 ENCOUNTER — Encounter (HOSPITAL_COMMUNITY): Payer: Self-pay

## 2019-09-13 DIAGNOSIS — Z20828 Contact with and (suspected) exposure to other viral communicable diseases: Secondary | ICD-10-CM | POA: Diagnosis not present

## 2019-09-13 DIAGNOSIS — Z01812 Encounter for preprocedural laboratory examination: Secondary | ICD-10-CM | POA: Diagnosis not present

## 2019-09-13 LAB — SARS CORONAVIRUS 2 (TAT 6-24 HRS): SARS Coronavirus 2: NEGATIVE

## 2019-09-14 ENCOUNTER — Encounter (HOSPITAL_COMMUNITY)
Admission: RE | Disposition: A | Discharge status: Home / Self Care | Payer: Self-pay | Source: Home / Self Care | Attending: Urology

## 2019-09-14 ENCOUNTER — Other Ambulatory Visit: Payer: Self-pay

## 2019-09-14 ENCOUNTER — Ambulatory Visit (HOSPITAL_COMMUNITY)
Admission: RE | Admit: 2019-09-14 | Discharge: 2019-09-14 | Disposition: A | Discharge status: Home / Self Care | Payer: BC Managed Care – PPO | Attending: Urology | Admitting: Urology

## 2019-09-14 ENCOUNTER — Encounter (HOSPITAL_COMMUNITY): Payer: Self-pay

## 2019-09-14 ENCOUNTER — Ambulatory Visit (HOSPITAL_COMMUNITY): Payer: BC Managed Care – PPO | Admitting: Anesthesiology

## 2019-09-14 DIAGNOSIS — I10 Essential (primary) hypertension: Secondary | ICD-10-CM | POA: Insufficient documentation

## 2019-09-14 DIAGNOSIS — C675 Malignant neoplasm of bladder neck: Secondary | ICD-10-CM | POA: Diagnosis not present

## 2019-09-14 DIAGNOSIS — Z87891 Personal history of nicotine dependence: Secondary | ICD-10-CM | POA: Diagnosis not present

## 2019-09-14 DIAGNOSIS — Z947 Corneal transplant status: Secondary | ICD-10-CM | POA: Diagnosis not present

## 2019-09-14 DIAGNOSIS — C672 Malignant neoplasm of lateral wall of bladder: Secondary | ICD-10-CM | POA: Diagnosis not present

## 2019-09-14 DIAGNOSIS — Z888 Allergy status to other drugs, medicaments and biological substances status: Secondary | ICD-10-CM | POA: Insufficient documentation

## 2019-09-14 DIAGNOSIS — N3289 Other specified disorders of bladder: Secondary | ICD-10-CM | POA: Diagnosis not present

## 2019-09-14 DIAGNOSIS — M069 Rheumatoid arthritis, unspecified: Secondary | ICD-10-CM | POA: Insufficient documentation

## 2019-09-14 DIAGNOSIS — C679 Malignant neoplasm of bladder, unspecified: Secondary | ICD-10-CM | POA: Diagnosis not present

## 2019-09-14 DIAGNOSIS — K219 Gastro-esophageal reflux disease without esophagitis: Secondary | ICD-10-CM | POA: Diagnosis not present

## 2019-09-14 HISTORY — PX: FULGURATION OF BLADDER TUMOR: SHX6261

## 2019-09-14 HISTORY — PX: CYSTOSCOPY WITH BIOPSY: SHX5122

## 2019-09-14 SURGERY — CYSTOSCOPY, WITH BIOPSY
Anesthesia: General

## 2019-09-14 MED ORDER — FENTANYL CITRATE (PF) 250 MCG/5ML IJ SOLN
INTRAMUSCULAR | Status: AC
  Filled 2019-09-14: qty 5

## 2019-09-14 MED ORDER — HYDROMORPHONE HCL 1 MG/ML IJ SOLN: 0.2500 mg | INTRAMUSCULAR | Status: DC | PRN

## 2019-09-14 MED ORDER — GLYCOPYRROLATE 0.2 MG/ML IJ SOLN
INTRAMUSCULAR | Status: DC | PRN
  Administered 2019-09-14: .2 mg via INTRAVENOUS

## 2019-09-14 MED ORDER — MEPERIDINE HCL 50 MG/ML IJ SOLN: 6.2500 mg | INTRAMUSCULAR | Status: DC | PRN

## 2019-09-14 MED ORDER — LIDOCAINE HCL URETHRAL/MUCOSAL 2 % EX GEL
CUTANEOUS | Status: AC
  Filled 2019-09-14: qty 10

## 2019-09-14 MED ORDER — CEFAZOLIN SODIUM-DEXTROSE 2-4 GM/100ML-% IV SOLN
2.0000 g | INTRAVENOUS | Status: AC
  Administered 2019-09-14: 2 g via INTRAVENOUS
  Filled 2019-09-14: qty 100

## 2019-09-14 MED ORDER — LIDOCAINE HCL (CARDIAC) PF 50 MG/5ML IV SOSY
PREFILLED_SYRINGE | INTRAVENOUS | Status: DC | PRN
  Administered 2019-09-14: 50 mg via INTRAVENOUS

## 2019-09-14 MED ORDER — PROPOFOL 10 MG/ML IV BOLUS
INTRAVENOUS | Status: DC | PRN
  Administered 2019-09-14: 25 mg via INTRAVENOUS
  Administered 2019-09-14: 250 mg via INTRAVENOUS

## 2019-09-14 MED ORDER — ONDANSETRON HCL 4 MG/2ML IJ SOLN: 4.0000 mg | Freq: Once | INTRAMUSCULAR | Status: DC | PRN

## 2019-09-14 MED ORDER — LIDOCAINE HCL URETHRAL/MUCOSAL 2 % EX GEL
CUTANEOUS | Status: DC | PRN
  Administered 2019-09-14: 1 via URETHRAL

## 2019-09-14 MED ORDER — PROPOFOL 10 MG/ML IV BOLUS
INTRAVENOUS | Status: AC
  Filled 2019-09-14: qty 20

## 2019-09-14 MED ORDER — FENTANYL CITRATE (PF) 100 MCG/2ML IJ SOLN
INTRAMUSCULAR | Status: DC | PRN
  Administered 2019-09-14 (×2): 25 ug via INTRAVENOUS
  Administered 2019-09-14: 50 ug via INTRAVENOUS
  Administered 2019-09-14 (×2): 25 ug via INTRAVENOUS

## 2019-09-14 MED ORDER — PROPOFOL 10 MG/ML IV BOLUS: INTRAVENOUS | Status: DC | PRN

## 2019-09-14 MED ORDER — CEPHALEXIN 250 MG PO CAPS: 250.0000 mg | ORAL_CAPSULE | Freq: Two times a day (BID) | ORAL | 0 refills | Status: DC

## 2019-09-14 MED ORDER — LACTATED RINGERS IV SOLN
Freq: Once | INTRAVENOUS | Status: AC
  Administered 2019-09-14 (×2): via INTRAVENOUS

## 2019-09-14 MED ORDER — MIDAZOLAM HCL 2 MG/2ML IJ SOLN
INTRAMUSCULAR | Status: AC
  Filled 2019-09-14: qty 2

## 2019-09-14 MED ORDER — WATER FOR IRRIGATION, STERILE IR SOLN
Status: DC | PRN
  Administered 2019-09-14: 500 mL

## 2019-09-14 MED ORDER — STERILE WATER FOR IRRIGATION IR SOLN
Status: DC | PRN
  Administered 2019-09-14: 3000 mL

## 2019-09-14 MED ORDER — MIDAZOLAM HCL 5 MG/5ML IJ SOLN
INTRAMUSCULAR | Status: DC | PRN
  Administered 2019-09-14: 2 mg via INTRAVENOUS

## 2019-09-14 SURGICAL SUPPLY — 18 items
BAG DRAIN URO TABLE W/ADPT NS (BAG) ×2 IMPLANT
BAG URINE LEG 500ML (DRAIN) ×1 IMPLANT
CATH FOLEY 2WAY SLVR  5CC 20FR (CATHETERS) ×1
CATH FOLEY 2WAY SLVR 5CC 20FR (CATHETERS) IMPLANT
CLOTH BEACON ORANGE TIMEOUT ST (SAFETY) ×2 IMPLANT
ELECT REM PT RETURN 9FT ADLT (ELECTROSURGICAL) ×2
ELECTRODE REM PT RTRN 9FT ADLT (ELECTROSURGICAL) ×1 IMPLANT
GLOVE BIO SURGEON STRL SZ8 (GLOVE) ×2 IMPLANT
GOWN STRL REUS W/TWL LRG LVL3 (GOWN DISPOSABLE) ×2 IMPLANT
GOWN STRL REUS W/TWL XL LVL3 (GOWN DISPOSABLE) ×2 IMPLANT
KIT TURNOVER CYSTO (KITS) ×2 IMPLANT
MANIFOLD NEPTUNE II (INSTRUMENTS) ×2 IMPLANT
PACK CYSTO (CUSTOM PROCEDURE TRAY) ×2 IMPLANT
PAD ARMBOARD 7.5X6 YLW CONV (MISCELLANEOUS) ×2 IMPLANT
PAD TELFA 3X4 1S STER (GAUZE/BANDAGES/DRESSINGS) ×2 IMPLANT
SYR CONTROL 10ML LL (SYRINGE) ×2 IMPLANT
TOWEL OR 17X26 4PK STRL BLUE (TOWEL DISPOSABLE) ×2 IMPLANT
WATER STERILE IRR 3000ML UROMA (IV SOLUTION) ×2 IMPLANT

## 2019-09-14 NOTE — Discharge Instructions (Signed)
1. You may see some blood in the urine and may have some burning with urination for 48-72 hours. You also may notice that you have to urinate more frequently or urgently after your procedure which is normal.  2. You should call should you develop an inability urinate, fever > 101, persistent nausea and vomiting that prevents you from eating or drinking to stay hydrated.  3. If you have a stent, you will likely urinate more frequently and urgently until the stent is removed and you may experience some discomfort/pain in the lower abdomen and flank especially when urinating. You may take pain medication prescribed to you if needed for pain. You may also intermittently have blood in the urine until the stent is removed. 4. If you have a catheter, you will be taught how to take care of the catheter by the nursing staff prior to discharge from the hospital.  You may periodically feel a strong urge to void with the catheter in place.  This is a bladder spasm and most often can occur when having a bowel movement or moving around. It is typically self-limited and usually will stop after a few minutes.  You may use some Vaseline or Neosporin around the tip of the catheter to reduce friction at the tip of the penis. You may also see some blood in the urine.  A very small amount of blood can make the urine look quite red.  As long as the catheter is draining well, there usually is not a problem.  However, if the catheter is not draining well and is bloody, you should call the office [phone] to notify us.  If your urine is fairly clear, it is okay to remove the catheter as instructed on Wednesday morning.

## 2019-09-14 NOTE — Op Note (Signed)
Preoperative diagnosis: History of high-grade nonmuscle invasive bladder cancer, status post prior resection  Postoperative diagnosis: Same, with 2 lesions noted from prior resection--bladder neck, left bladder wall  Principal procedure: Cystoscopy, bladder biopsy, fulguration of prior TUR sites  Surgeon: Daquane Aguilar  Anesthesia: General with LMA  Complications: None  Specimens: 1. left lateral wall biopsies   2.  Bladder neck biopsies  Drains: 18 French Foley catheter, to leg bag  Estimated blood loss: Less than 5 mL  Indications: 58 year old male status post recent TURBT.  Pathology revealed nonmuscle invasive but high-grade bladder cancer from several bladder sites.  He presents at this time for repeat biopsy for restaging.  Findings: Prostate was nonobstructive but bladder neck was fairly high riding.  There were no lesions within his prostatic urethra.  Within the bladder, old resection site present on the left lateral wall.  Also, in the posterior midline, smaller in size.  Both of these had mild urothelial atypia grossly.  Additionally, similar findings at the left bladder neck.  Cold cup biopsy forceps were utilized to remove all abnormal tissue in the left lateral wall and bladder neck areas.  These were sent separately, labeled left lateral wall and bladder neck, for permanent pathology.  Biopsy sites were carefully made hemostatic by Bugbee electrode.  Additionally, the abnormal looking urothelium in the posterior bladder was also cauterized.  No biopsies were taken from the site.  The rest of the urothelium looked normal.  There being no bleeding at this point, the scope was removed.  82 French Foley catheter was placed hooked to leg bag.  Balloon filled with 10 cc of water.  At this point the procedure was terminated.  The patient was awakened, extubated, taken to the PACU in stable condition, having tolerated the procedure well.

## 2019-09-14 NOTE — H&P (Signed)
H&P  Chief Complaint: Bladder cancer  History of Present Illness: 58 year old male with history of bladder cancer.  Initial resection on August 05, 2019.  Pathology revealed papillary high-grade nonmuscle invasive bladder cancer.  Gemcitabine was not given as there was a small perforation secondary to obturator reflex.  He presents at this time for restaging resection due to his history of high-grade cancer.  Past Medical History:  Diagnosis Date  . Cataract   . Full dentures   . GERD (gastroesophageal reflux disease)   . Hypertension   . Rheumatoid arthritis [facility])     Past Surgical History:  Procedure Laterality Date  . CORNEAL TRANSPLANT Left 2013  . TRANSURETHRAL RESECTION OF BLADDER TUMOR WITH MITOMYCIN-C N/A 08/05/2019   Procedure: TRANSURETHRAL RESECTION OF BLADDER TUMOR;  Surgeon: Franchot Gallo, MD;  Location: [facility];  Service: Urology;  Laterality: N/A;  1 HR    Home Medications:    Allergies:  Allergies  Allergen Reactions  . Ace Inhibitors Cough    History reviewed. No pertinent family history.  Social History:  reports that he has quit smoking. His smoking use included cigarettes. He has never used smokeless tobacco. He reports current alcohol use. He reports that he does not use drugs.  ROS: A complete review of systems was performed.  All systems are negative except for pertinent findings as noted.  Physical Exam:  Vital signs in last 24 hours: Pulse Rate:  [75] 75 (10/27 1137) Resp:  [18] 18 (10/27 1137) BP: (126)/(91) 126/91 (10/27 1137) SpO2:  [94 %] 94 % (10/27 1137) Weight:  [106.6 kg] 106.6 kg (10/27 1137) Constitutional:  Alert and oriented, No acute distress Cardiovascular: Regular rate  Respiratory: Normal respiratory effort GI: Abdomen is soft, nontender, nondistended, no abdominal masses. No CVAT.  Genitourinary: Normal male phallus, testes are descended bilaterally and non-tender and without masses, scrotum is normal  in appearance without lesions or masses, perineum is normal on inspection. Lymphatic: No lymphadenopathy Neurologic: Grossly intact, no focal deficits Psychiatric: Normal mood and affect  Laboratory Data:  No results for input(s): WBC, HGB, HCT, PLT in the last 72 hours.  No results for input(s): NA, K, CL, GLUCOSE, BUN, CALCIUM, CREATININE in the last 72 hours.  Invalid input(s): CO3   No results found for this or any previous visit (from the past 24 hour(s)). Recent Results (from the past 240 hour(s))  SARS CORONAVIRUS 2 (TAT 6-24 HRS) Nasopharyngeal Nasopharyngeal Swab     Status: None   Collection Time: 09/13/19  7:12 AM   Specimen: Nasopharyngeal Swab  Result Value Ref Range Status   SARS Coronavirus 2 NEGATIVE NEGATIVE Final    Comment: (NOTE) SARS-CoV-2 target nucleic acids are NOT DETECTED. The SARS-CoV-2 RNA is generally detectable in upper and lower respiratory specimens during the acute phase of infection. Negative results do not preclude SARS-CoV-2 infection, do not rule out co-infections with other pathogens, and should not be used as the sole basis for treatment or other patient management decisions. Negative results must be combined with clinical observations, patient history, and epidemiological information. The expected result is Negative. Fact Sheet for Patients: SugarRoll.be Fact Sheet for Healthcare Providers: https://www.woods-mathews.com/ This test is not yet approved or cleared by the [place] FDA and  has been authorized for detection and/or diagnosis of SARS-CoV-2 by FDA under an Emergency Use Authorization (EUA). This EUA will remain  in effect (meaning this test can be used) for the duration of the COVID-19 declaration under Section 56 4(b)(1) of  the Act, 21 U.S.C. section 360bbb-3(b)(1), unless the authorization is terminated or revoked sooner. Performed at [facility], [place] [address],  [place], [place] [address]     Renal Function: No results for input(s): CREATININE in the last 168 hours. CrCl cannot be calculated (Patient's most recent lab result is older than the maximum 21 days allowed.).  Radiologic Imaging: No results found.  Impression/Assessment:  High-grade, nonmuscle invasive bladder cancer, status post initial resection approximately 1 month ago  Plan:  Cystoscopy, repeat biopsy

## 2019-09-14 NOTE — Anesthesia Preprocedure Evaluation (Addendum)
Anesthesia Evaluation  Patient identified by MRN, date of birth, ID band Patient awake    Reviewed: Allergy & Precautions, NPO status , Patient's Chart, lab work & pertinent test results  History of Anesthesia Complications Negative for: history of anesthetic complications  Airway Mallampati: III  TM Distance: >3 FB Neck ROM: Full    Dental  (+) Lower Dentures, Upper Dentures   Pulmonary former smoker,    Pulmonary exam normal        Cardiovascular Exercise Tolerance: Good hypertension, Pt. on medications Normal cardiovascular exam  14-Sep-2019 11:31:38 [facility] ROUTINE RECORD Normal sinus rhythm Nonspecific ST abnormality Abnormal ECG   Neuro/Psych negative neurological ROS  negative psych ROS   GI/Hepatic Neg liver ROS, GERD (patient taking omeprazole for gastric ulcers)  Medicated,  Endo/Other  negative endocrine ROS  Renal/GU negative Renal ROS  negative genitourinary   Musculoskeletal  (+) Arthritis , Rheumatoid disorders,    Abdominal   Peds  Hematology negative hematology ROS (+)   Anesthesia Other Findings   Reproductive/Obstetrics                            Anesthesia Physical Anesthesia Plan  ASA: II  Anesthesia Plan: General   Post-op Pain Management:    Induction:   PONV Risk Score and Plan:   Airway Management Planned: LMA  Additional Equipment:   Intra-op Plan:   Post-operative Plan: Extubation in OR  Informed Consent: I have reviewed the patients History and Physical, chart, labs and discussed the procedure including the risks, benefits and alternatives for the proposed anesthesia with the patient or authorized representative who has indicated his/her understanding and acceptance.       Plan Discussed with: CRNA  Anesthesia Plan Comments:        Anesthesia Quick Evaluation

## 2019-09-14 NOTE — Anesthesia Postprocedure Evaluation (Signed)
Anesthesia Post Note  Patient: Barry Gibson  Procedure(s) Performed: CYSTOSCOPY WITH BIOPSY (N/A )  Patient location during evaluation: PACU Anesthesia Type: General Level of consciousness: awake and alert and oriented Pain management: pain level controlled Vital Signs Assessment: post-procedure vital signs reviewed and stable Respiratory status: spontaneous breathing Cardiovascular status: stable Postop Assessment: no apparent nausea or vomiting Anesthetic complications: no     Last Vitals:  Vitals:   09/14/19 1315 09/14/19 1330  BP: (!) 142/92 (!) 142/93  Pulse: 75 73  Resp: 11 11  Temp:    SpO2: 92% 92%    Last Pain:  Vitals:   09/14/19 1300  TempSrc:   PainSc: 0-No pain                 Jaymond Waage A

## 2019-09-14 NOTE — Anesthesia Procedure Notes (Signed)
Procedure Name: LMA Insertion Date/Time: 09/14/2019 12:28 PM Performed by: Ollen Bowl, CRNA Pre-anesthesia Checklist: Patient identified, Patient being monitored, Emergency Drugs available, Timeout performed and Suction available Patient Re-evaluated:Patient Re-evaluated prior to induction Oxygen Delivery Method: Circle System Utilized Preoxygenation: Pre-oxygenation with 100% oxygen Induction Type: IV induction Ventilation: Mask ventilation without difficulty LMA: LMA inserted LMA Size: 5.0 Number of attempts: 1 Placement Confirmation: positive ETCO2 and breath sounds checked- equal and bilateral

## 2019-09-14 NOTE — Transfer of Care (Signed)
Immediate Anesthesia Transfer of Care Note  Patient: Barry Gibson  Procedure(s) Performed: CYSTOSCOPY WITH BIOPSY (N/A )  Patient Location: PACU  Anesthesia Type:General  Level of Consciousness: awake  Airway & Oxygen Therapy: Patient Spontanous Breathing  Post-op Assessment: Report given to RN  Post vital signs: Reviewed and stable  Last Vitals:  Vitals Value Taken Time  BP 142/90 09/14/19 1301  Temp    Pulse 80 09/14/19 1304  Resp 12 09/14/19 1304  SpO2 96 % 09/14/19 1304  Vitals shown include unvalidated device data.  Last Pain:  Vitals:   09/14/19 1137  TempSrc: Oral  PainSc: 0-No pain         Complications: No apparent anesthesia complications

## 2019-09-15 ENCOUNTER — Encounter (HOSPITAL_COMMUNITY): Payer: Self-pay | Admitting: Urology

## 2019-09-15 LAB — SURGICAL PATHOLOGY

## 2019-09-28 ENCOUNTER — Ambulatory Visit (INDEPENDENT_AMBULATORY_CARE_PROVIDER_SITE_OTHER): Payer: BC Managed Care – PPO | Admitting: Urology

## 2019-09-28 DIAGNOSIS — C678 Malignant neoplasm of overlapping sites of bladder: Secondary | ICD-10-CM | POA: Diagnosis not present

## 2019-10-05 ENCOUNTER — Ambulatory Visit (INDEPENDENT_AMBULATORY_CARE_PROVIDER_SITE_OTHER): Payer: BC Managed Care – PPO | Admitting: Urology

## 2019-10-05 DIAGNOSIS — C678 Malignant neoplasm of overlapping sites of bladder: Secondary | ICD-10-CM | POA: Diagnosis not present

## 2019-10-12 ENCOUNTER — Other Ambulatory Visit: Payer: Self-pay

## 2019-10-12 ENCOUNTER — Ambulatory Visit (INDEPENDENT_AMBULATORY_CARE_PROVIDER_SITE_OTHER): Payer: BC Managed Care – PPO | Admitting: Urology

## 2019-10-12 DIAGNOSIS — C678 Malignant neoplasm of overlapping sites of bladder: Secondary | ICD-10-CM

## 2019-10-13 DIAGNOSIS — Z79899 Other long term (current) drug therapy: Secondary | ICD-10-CM | POA: Diagnosis not present

## 2019-10-13 DIAGNOSIS — M150 Primary generalized (osteo)arthritis: Secondary | ICD-10-CM | POA: Diagnosis not present

## 2019-10-13 DIAGNOSIS — M25511 Pain in right shoulder: Secondary | ICD-10-CM | POA: Diagnosis not present

## 2019-10-13 DIAGNOSIS — M0579 Rheumatoid arthritis with rheumatoid factor of multiple sites without organ or systems involvement: Secondary | ICD-10-CM | POA: Diagnosis not present

## 2019-10-19 ENCOUNTER — Ambulatory Visit (INDEPENDENT_AMBULATORY_CARE_PROVIDER_SITE_OTHER): Payer: BC Managed Care – PPO | Admitting: Urology

## 2019-10-19 DIAGNOSIS — C678 Malignant neoplasm of overlapping sites of bladder: Secondary | ICD-10-CM

## 2019-10-26 ENCOUNTER — Ambulatory Visit (INDEPENDENT_AMBULATORY_CARE_PROVIDER_SITE_OTHER): Payer: BC Managed Care – PPO | Admitting: Urology

## 2019-10-26 ENCOUNTER — Telehealth: Payer: Self-pay | Admitting: Family Medicine

## 2019-10-26 DIAGNOSIS — E785 Hyperlipidemia, unspecified: Secondary | ICD-10-CM

## 2019-10-26 DIAGNOSIS — Z125 Encounter for screening for malignant neoplasm of prostate: Secondary | ICD-10-CM

## 2019-10-26 DIAGNOSIS — C678 Malignant neoplasm of overlapping sites of bladder: Secondary | ICD-10-CM

## 2019-10-26 DIAGNOSIS — Z79899 Other long term (current) drug therapy: Secondary | ICD-10-CM

## 2019-10-26 NOTE — Telephone Encounter (Addendum)
Blood work ordered in EPIC. Patient notified. 

## 2019-10-26 NOTE — Telephone Encounter (Signed)
Patient has 6 month follow up on 12/16 and wanting to know if he needs labs done

## 2019-10-26 NOTE — Telephone Encounter (Signed)
Last labs: 08/07/19- Met 7 and CBC                  06/02/19-Lipid and Liver

## 2019-10-26 NOTE — Telephone Encounter (Signed)
Met 7 lip liv psa cbc

## 2019-11-02 ENCOUNTER — Ambulatory Visit (INDEPENDENT_AMBULATORY_CARE_PROVIDER_SITE_OTHER): Payer: BC Managed Care – PPO

## 2019-11-02 VITALS — Temp 97.3°F

## 2019-11-02 DIAGNOSIS — C679 Malignant neoplasm of bladder, unspecified: Secondary | ICD-10-CM

## 2019-11-02 LAB — POCT URINALYSIS DIPSTICK
Bilirubin, UA: NEGATIVE
Glucose, UA: NEGATIVE
Ketones, UA: NEGATIVE
Nitrite, UA: NEGATIVE
Protein, UA: POSITIVE — AB
Spec Grav, UA: 1.02 (ref 1.010–1.025)
Urobilinogen, UA: NEGATIVE E.U./dL — AB
pH, UA: 6.5 (ref 5.0–8.0)

## 2019-11-02 MED ORDER — BCG LIVE 50 MG IS SUSR
3.2400 mL | Freq: Once | INTRAVESICAL | Status: AC
  Administered 2019-11-02: 16:00:00 81 mg via INTRAVESICAL

## 2019-11-02 NOTE — Progress Notes (Signed)
BCG Bladder Instillation  BCG # 6 of 6  Due to Bladder Cancer patient is present today for a BCG treatment. Patient was cleaned and prepped in a sterile fashion with betadine and lidocaine 2% jelly was instilled into the urethra.  A 14FR catheter was inserted, urine return was noted 12ml, urine was yellow in color.  68ml of reconstituted BCG was instilled into the bladder. The catheter was then removed. Patient tolerated well, no complications were noted  Preformed by: Wilburta Milbourn  Follow up/ Additional notes: 1 month

## 2019-11-03 ENCOUNTER — Encounter: Payer: Self-pay | Admitting: Family Medicine

## 2019-11-03 ENCOUNTER — Ambulatory Visit: Payer: BC Managed Care – PPO | Admitting: Family Medicine

## 2019-11-03 ENCOUNTER — Ambulatory Visit (INDEPENDENT_AMBULATORY_CARE_PROVIDER_SITE_OTHER): Payer: BC Managed Care – PPO | Admitting: Family Medicine

## 2019-11-03 DIAGNOSIS — I10 Essential (primary) hypertension: Secondary | ICD-10-CM

## 2019-11-03 DIAGNOSIS — E785 Hyperlipidemia, unspecified: Secondary | ICD-10-CM

## 2019-11-03 DIAGNOSIS — N400 Enlarged prostate without lower urinary tract symptoms: Secondary | ICD-10-CM

## 2019-11-03 DIAGNOSIS — Z125 Encounter for screening for malignant neoplasm of prostate: Secondary | ICD-10-CM | POA: Diagnosis not present

## 2019-11-03 DIAGNOSIS — Z79899 Other long term (current) drug therapy: Secondary | ICD-10-CM | POA: Diagnosis not present

## 2019-11-03 MED ORDER — LOSARTAN POTASSIUM 100 MG PO TABS: 100.0000 mg | ORAL_TABLET | Freq: Every day | ORAL | 1 refills | Status: DC

## 2019-11-03 MED ORDER — PRAVASTATIN SODIUM 40 MG PO TABS: 40.0000 mg | ORAL_TABLET | Freq: Every day | ORAL | 1 refills | Status: DC

## 2019-11-03 MED ORDER — CHLORTHALIDONE 25 MG PO TABS: 12.5000 mg | ORAL_TABLET | Freq: Every day | ORAL | 1 refills | Status: DC

## 2019-11-03 NOTE — Progress Notes (Signed)
   Subjective:  Audio only  Patient ID: Barry Gibson, male    DOB: 01/08/1961, 58 y.o.   MRN: [id number]  Hyperlipidemia This is a chronic problem. Compliance problems: does not eat fast food anymore, walks some for exercise, has lost 17 lbs.    Pt states he has bladder cancer. Finished treatment yesterday.   Virtual Visit via Telephone Note  I connected with Barry Gibson on 11/03/19 at  1:10 PM EST by telephone and verified that I am speaking with the correct person using two identifiers.  Location: Patient: home Provider: office   I discussed the limitations, risks, security and privacy concerns of performing an evaluation and management service by telephone and the availability of in person appointments. I also discussed with the patient that there may be a patient responsible charge related to this service. The patient expressed understanding and agreed to proceed.   History of Present Illness:    Observations/Objective:   Assessment and Plan:   Follow Up Instructions:    I discussed the assessment and treatment plan with the patient. The patient was provided an opportunity to ask questions and all were answered. The patient agreed with the plan and demonstrated an understanding of the instructions.   The patient was advised to call back or seek an in-person evaluation if the symptoms worsen or if the condition fails to improve as anticipated.  I provided 25 minutes of non-face-to-face time during this encounter.  Blood pressure medicine and blood pressure levels reviewed today with patient. Compliant with blood pressure medicine. States does not miss a dose. No obvious side effects. Blood pressure generally good when checked elsewhere. Watching salt intake.   Patient continues to take lipid medication regularly. No obvious side effects from it. Generally does not miss a dose. Prior blood work results are reviewed with patient. Patient continues to work on fat  intake in diet  Blood work tests still pending  Patient has been through treatment both surgical and medical for bladder cancer.  Happy with the attentive care of Dr. Diona Fanti and others  Review of Systems No headache, no major weight loss or weight gain, no chest pain no back pain abdominal pain no change in bowel habits complete ROS otherwise negative     Objective:   Physical Exam  Virtual      Assessment & Plan:  Impression 1 hypertension very apparent good control discussed maintain same meds blood pressure excellent checked elsewhere  2.  Hyperlipidemia status uncertain discussed will need to evaluate.  Blood work encouraged patient to go get  3.  Vaccines discussed flu shot encouraged  4.  Bladder cancer discussed  5.  COVID-19 concerns discussed  Medications refilled await blood work patient to call and get flu shot.  Diet exercise discussed

## 2019-11-04 ENCOUNTER — Other Ambulatory Visit: Payer: Self-pay

## 2019-11-04 ENCOUNTER — Other Ambulatory Visit (INDEPENDENT_AMBULATORY_CARE_PROVIDER_SITE_OTHER): Payer: BC Managed Care – PPO

## 2019-11-04 DIAGNOSIS — Z23 Encounter for immunization: Secondary | ICD-10-CM

## 2019-11-04 LAB — CBC WITH DIFFERENTIAL/PLATELET
Basophils Absolute: 0.1 10*3/uL (ref 0.0–0.2)
Basos: 1 %
EOS (ABSOLUTE): 0.2 10*3/uL (ref 0.0–0.4)
Eos: 2 %
Hematocrit: 45.9 % (ref 37.5–51.0)
Hemoglobin: 15.4 g/dL (ref 13.0–17.7)
Immature Grans (Abs): 0 10*3/uL (ref 0.0–0.1)
Immature Granulocytes: 0 %
Lymphocytes Absolute: 2.6 10*3/uL (ref 0.7–3.1)
Lymphs: 30 %
MCH: 29.8 pg (ref 26.6–33.0)
MCHC: 33.6 g/dL (ref 31.5–35.7)
MCV: 89 fL (ref 79–97)
Monocytes Absolute: 0.7 10*3/uL (ref 0.1–0.9)
Monocytes: 8 %
Neutrophils Absolute: 5.1 10*3/uL (ref 1.4–7.0)
Neutrophils: 59 %
Platelets: 202 10*3/uL (ref 150–450)
RBC: 5.16 x10E6/uL (ref 4.14–5.80)
RDW: 12.9 % (ref 11.6–15.4)
WBC: 8.7 10*3/uL (ref 3.4–10.8)

## 2019-11-04 LAB — BASIC METABOLIC PANEL
BUN/Creatinine Ratio: 23 — ABNORMAL HIGH (ref 9–20)
BUN: 23 mg/dL (ref 6–24)
CO2: 24 mmol/L (ref 20–29)
Calcium: 9 mg/dL (ref 8.7–10.2)
Chloride: 105 mmol/L (ref 96–106)
Creatinine, Ser: 0.99 mg/dL (ref 0.76–1.27)
GFR calc Af Amer: 97 mL/min/{1.73_m2} (ref >59)
GFR calc non Af Amer: 84 mL/min/{1.73_m2} (ref >59)
Glucose: 99 mg/dL (ref 65–99)
Potassium: 4.2 mmol/L (ref 3.5–5.2)
Sodium: 143 mmol/L (ref 134–144)

## 2019-11-04 LAB — HEPATIC FUNCTION PANEL
ALT: 16 IU/L (ref 0–44)
AST: 14 IU/L (ref 0–40)
Albumin: 4 g/dL (ref 3.8–4.9)
Alkaline Phosphatase: 73 IU/L (ref 39–117)
Bilirubin Total: 0.6 mg/dL (ref 0.0–1.2)
Bilirubin, Direct: 0.15 mg/dL (ref 0.00–0.40)
Total Protein: 6.2 g/dL (ref 6.0–8.5)

## 2019-11-04 LAB — LIPID PANEL
Chol/HDL Ratio: 4.3 ratio (ref 0.0–5.0)
Cholesterol, Total: 154 mg/dL (ref 100–199)
HDL: 36 mg/dL — ABNORMAL LOW (ref >39)
LDL Chol Calc (NIH): 106 mg/dL — ABNORMAL HIGH (ref 0–99)
Triglycerides: 60 mg/dL (ref 0–149)
VLDL Cholesterol Cal: 12 mg/dL (ref 5–40)

## 2019-11-04 LAB — PSA: Prostate Specific Ag, Serum: 3.1 ng/mL (ref 0.0–4.0)

## 2019-11-05 ENCOUNTER — Telehealth: Payer: Self-pay

## 2019-11-05 NOTE — Telephone Encounter (Signed)
I tasked dr. Diona Fanti about pt. C/o urinary frequency and he answered in Urochart that he sent in [place]. Pts wife was notified.

## 2019-11-13 ENCOUNTER — Encounter: Payer: Self-pay | Admitting: Family Medicine

## 2019-11-13 DIAGNOSIS — U071 COVID-19: Secondary | ICD-10-CM

## 2019-11-13 HISTORY — DX: COVID-19: U07.1

## 2019-11-25 ENCOUNTER — Telehealth: Payer: Self-pay | Admitting: Family Medicine

## 2019-11-25 ENCOUNTER — Encounter: Payer: Self-pay | Admitting: Family Medicine

## 2019-11-25 NOTE — Telephone Encounter (Signed)
Pt needs work note, Advertising copywriter is requiring pt to have a note from [place] stating that due to CDC guidelines he may return to work  Pt had +Covid test on 11/13/2019 & his employer wants a letter from [place] giving the OK for him to return to work  Pt's wife states he still has a dry, hacky cough but NO fever & feels much better & no other symptoms  Please advise, informed we are closed tomorrow due to impending inclement weather & that Dr. Richardson Landry is off this afternoon, explained that it may be Monday before we can do the note  Wife states if it can be done today, they will pick up before we close   Please advise & call pt

## 2019-11-25 NOTE — Telephone Encounter (Signed)
Wife calling back about note because she said they won't let him come to work Friday without a note and she is worried about coming up here before we close today.  I told her I would send the patient the text to sign up for mychart because he can get the letter that way even if we are closed.  If Dr. Nicki Reaper approves a note today I will do a regular work note stating that he can return to work Saturday per CDC guidelines.

## 2019-11-25 NOTE — Telephone Encounter (Signed)
See other message from today. Note written and signed and ready for pickup today. Pt notifed

## 2019-11-25 NOTE — Telephone Encounter (Signed)
Discussed with pt's wife and she states she needs the date to be 1/9 instead of 1/8 that is when they have him to return at work. Note corrected and upfront for pickup.

## 2019-11-25 NOTE — Telephone Encounter (Signed)
Ok, may write, have dr scott sign state pt had covid 19, had a pos test on dec 26, has had no fever for three days, and per cdc guidelines is now able to return to work as of 1 8 2021

## 2019-11-25 NOTE — Telephone Encounter (Signed)
Left message to return call 

## 2019-11-25 NOTE — Telephone Encounter (Signed)
Note wrote and at nurse station awaiting signature

## 2019-11-30 ENCOUNTER — Ambulatory Visit (INDEPENDENT_AMBULATORY_CARE_PROVIDER_SITE_OTHER): Payer: BC Managed Care – PPO | Admitting: Urology

## 2019-11-30 ENCOUNTER — Other Ambulatory Visit (HOSPITAL_COMMUNITY)
Admission: RE | Admit: 2019-11-30 | Discharge: 2019-11-30 | Disposition: A | Discharge status: Home / Self Care | Payer: BC Managed Care – PPO | Source: Ambulatory Visit | Attending: Urology | Admitting: Urology

## 2019-11-30 ENCOUNTER — Other Ambulatory Visit: Payer: Self-pay

## 2019-11-30 ENCOUNTER — Encounter: Payer: Self-pay | Admitting: Urology

## 2019-11-30 VITALS — BP 144/88 | HR 80 | Temp 97.0°F | Ht 71.0 in | Wt 230.0 lb

## 2019-11-30 DIAGNOSIS — N521 Erectile dysfunction due to diseases classified elsewhere: Secondary | ICD-10-CM | POA: Diagnosis not present

## 2019-11-30 DIAGNOSIS — R8289 Other abnormal findings on cytological and histological examination of urine: Secondary | ICD-10-CM | POA: Diagnosis not present

## 2019-11-30 DIAGNOSIS — C679 Malignant neoplasm of bladder, unspecified: Secondary | ICD-10-CM | POA: Insufficient documentation

## 2019-11-30 LAB — POCT URINALYSIS DIPSTICK
Bilirubin, UA: NEGATIVE
Glucose, UA: NEGATIVE
Ketones, UA: NEGATIVE
Nitrite, UA: NEGATIVE
Protein, UA: NEGATIVE
Spec Grav, UA: 1.01 (ref 1.010–1.025)
Urobilinogen, UA: NEGATIVE E.U./dL — AB
pH, UA: 7 (ref 5.0–8.0)

## 2019-11-30 MED ORDER — CIPROFLOXACIN HCL 500 MG PO TABS
500.0000 mg | ORAL_TABLET | Freq: Once | ORAL | Status: AC
  Administered 2019-11-30: 18:00:00 500 mg via ORAL

## 2019-11-30 MED ORDER — SILDENAFIL CITRATE 100 MG PO TABS: 100.0000 mg | ORAL_TABLET | Freq: Every day | ORAL | 11 refills | Status: DC | PRN

## 2019-11-30 NOTE — Progress Notes (Signed)
H&P  Chief Complaint: Bladder Cancer Treatment  History of Present Illness:   1.12.2021:   (below copied from [facility] records): ]  Barry Gibson is a 59 year-old male established patient who is here for follow-up of bladder cancer treatment.  His bladder cancer was superficial and limitied to the bladder lining.   He did have a TURBT. His last bladder tumor was resected 08/05/2019.   9.17.2020: TURBT. Multifocal lesions seen, most on left bladder wall. Small perforation on left d/t obturator reflex. Gemzar not placed. He experienced postop retention following cath removal necessitating repeat catheter placement. Path--HG NMIBC (St T1), muscularis present.   10.13.2020: Returns today for follow-up, still having some significant issues with urinary frequency but he states that this was expected post-procedurally. He did have some minor hematuria at the initial part of his stream but this was the only instance and he denies having had associated dysuria. He has yet to try oxybutinin due to concerns over starting it while having post-op sx's.   10.27.2020: He underwent 2nd TURBT. 2 lesions resected-left anterior bladder neck and left bladder wall. Pathology revealed persistent high-grade non muscle invasive bladder cancer. Gemcitabine placed following procedure.   11.10.2020: This man is here to start BCG induction. He does have urinary frequency and urgency following has resections. He has had no dysuria or gross hematuria recently.   12.15.2020: Completed 6 wks of BCG induction    Past Medical History:  Diagnosis Date  . Cataract   . Full dentures   . GERD (gastroesophageal reflux disease)   . Hypertension   . Rheumatoid arthritis [facility])     Past Surgical History:  Procedure Laterality Date  . CORNEAL TRANSPLANT Left 2013  . CYSTOSCOPY WITH BIOPSY N/A 09/14/2019   Procedure: CYSTOSCOPY WITH BIOPSY;  Surgeon: Franchot Gallo, MD;  Location: [facility];  Service: Urology;  Laterality:  N/A;  . FULGURATION OF BLADDER TUMOR N/A 09/14/2019   Procedure: FULGURATION OF BLADDER TUMOR;  Surgeon: Franchot Gallo, MD;  Location: [facility];  Service: Urology;  Laterality: N/A;  . TRANSURETHRAL RESECTION OF BLADDER TUMOR WITH MITOMYCIN-C N/A 08/05/2019   Procedure: TRANSURETHRAL RESECTION OF BLADDER TUMOR;  Surgeon: Franchot Gallo, MD;  Location: [facility];  Service: Urology;  Laterality: N/A;  1 HR    Home Medications:  Allergies as of 11/30/2019      Reactions   Ace Inhibitors Cough      Medication List       Accurate as of November 30, 2019  4:08 PM. If you have any questions, ask your nurse or doctor.        acetaminophen 650 MG CR tablet Commonly known as: TYLENOL Take 650 mg by mouth every 8 (eight) hours as needed for pain.   chlorthalidone 25 MG tablet Commonly known as: HYGROTON Take 0.5 tablets (12.5 mg total) by mouth daily.   finasteride 5 MG tablet Commonly known as: PROSCAR Take 5 mg by mouth daily.   Humira Pen 40 MG/0.4ML Pnkt Generic drug: Adalimumab Inject 40 mg into the skin every 14 (fourteen) days.   leflunomide 20 MG tablet Commonly known as: ARAVA Take 20 mg by mouth daily.   losartan 100 MG tablet Commonly known as: COZAAR Take 1 tablet (100 mg total) by mouth daily.   omeprazole 40 MG capsule Commonly known as: PRILOSEC Take 40 mg by mouth daily.   pravastatin 40 MG tablet Commonly known as: PRAVACHOL Take 1 tablet (40 mg total) by mouth daily.   prednisoLONE  acetate 1 % ophthalmic suspension Commonly known as: PRED FORTE Place 1 drop into both eyes 2 (two) times a week.   sildenafil 20 MG tablet Commonly known as: REVATIO Take two to three tablet by mouth 2 hours prior to sex   tamsulosin 0.4 MG Caps capsule Commonly known as: FLOMAX Take 2 capsules (0.8 mg total) by mouth daily.       Allergies:  Allergies  Allergen Reactions  . Ace Inhibitors Cough    No family history on file.  Social  History:  reports that he has quit smoking. His smoking use included cigarettes. He has never used smokeless tobacco. He reports current alcohol use. He reports that he does not use drugs.  ROS: A complete review of systems was performed.  All systems are negative except for pertinent findings as noted.  Physical Exam:  Vital signs in last 24 hours: There were no vitals taken for this visit. Constitutional:  Alert and oriented, No acute distress Cardiovascular: Regular rate  Respiratory: Normal respiratory effort Genitourinary: Normal male phallus, testes are descended bilaterally and non-tender and without masses, scrotum is normal in appearance without lesions or masses, perineum is normal on inspection. Lymphatic: No lymphadenopathy Neurologic: Grossly intact, no focal deficits Psychiatric: Normal mood and affect  Laboratory Data:  No results for input(s): WBC, HGB, HCT, PLT in the last 72 hours.  No results for input(s): NA, K, CL, GLUCOSE, BUN, CALCIUM, CREATININE in the last 72 hours.  Invalid input(s): CO3   No results found for this or any previous visit (from the past 24 hour(s)). No results found for this or any previous visit (from the past 240 hour(s)).  Renal Function: No results for input(s): CREATININE in the last 168 hours. CrCl cannot be calculated (Patient's most recent lab result is older than the maximum 21 days allowed.).  Radiologic Imaging: No results found.   Cysto: Urethra nml. Prostatic urethra moderately obstructive w/o urothelial abnormality. Bladder sytematically inspected. Multiple resection sites healing. Rt BN area w/ raised urothelium w/ out papillary configuration--more likely c/w prior resection/BCG. No other lesions seen. Washings taken.  Impression/Assessment:  NMIBC s/p 2 resections/BCG induction. Cysto c/w healing  LUTS--likely related to BPH as well as resections/bladder irritability Plan:  1. 2 mo - 3 weekly maintenance BCG treatments.    2. 3 mo - return for OV w/ cysto   3. Urine sent for cytology.   4. OK to stop ditropan--continue flomax

## 2019-12-01 ENCOUNTER — Encounter: Payer: Self-pay | Admitting: Urology

## 2019-12-08 ENCOUNTER — Other Ambulatory Visit: Payer: Self-pay | Admitting: Family Medicine

## 2019-12-22 ENCOUNTER — Encounter: Payer: Self-pay | Admitting: Family Medicine

## 2019-12-28 LAB — CYTOLOGY - NON PAP

## 2020-01-18 DIAGNOSIS — M0579 Rheumatoid arthritis with rheumatoid factor of multiple sites without organ or systems involvement: Secondary | ICD-10-CM | POA: Diagnosis not present

## 2020-01-18 DIAGNOSIS — M25511 Pain in right shoulder: Secondary | ICD-10-CM | POA: Diagnosis not present

## 2020-01-18 DIAGNOSIS — Z79899 Other long term (current) drug therapy: Secondary | ICD-10-CM | POA: Diagnosis not present

## 2020-01-18 DIAGNOSIS — M150 Primary generalized (osteo)arthritis: Secondary | ICD-10-CM | POA: Diagnosis not present

## 2020-02-01 ENCOUNTER — Ambulatory Visit: Payer: BC Managed Care – PPO

## 2020-02-01 ENCOUNTER — Other Ambulatory Visit: Payer: Self-pay

## 2020-02-01 ENCOUNTER — Ambulatory Visit (INDEPENDENT_AMBULATORY_CARE_PROVIDER_SITE_OTHER): Payer: BC Managed Care – PPO

## 2020-02-01 VITALS — Temp 97.2°F

## 2020-02-01 DIAGNOSIS — C679 Malignant neoplasm of bladder, unspecified: Secondary | ICD-10-CM

## 2020-02-01 LAB — POCT URINALYSIS DIPSTICK
Bilirubin, UA: NEGATIVE
Glucose, UA: NEGATIVE
Ketones, UA: NEGATIVE
Leukocytes, UA: NEGATIVE
Nitrite, UA: NEGATIVE
Protein, UA: POSITIVE — AB
Spec Grav, UA: 1.025 (ref 1.010–1.025)
Urobilinogen, UA: NEGATIVE E.U./dL — AB
pH, UA: 7 (ref 5.0–8.0)

## 2020-02-01 MED ORDER — BCG LIVE 50 MG IS SUSR
3.2400 mL | Freq: Once | INTRAVESICAL | Status: AC
  Administered 2020-02-01: 81 mg via INTRAVESICAL

## 2020-02-08 ENCOUNTER — Ambulatory Visit: Payer: BC Managed Care – PPO

## 2020-02-08 ENCOUNTER — Other Ambulatory Visit: Payer: Self-pay

## 2020-02-08 ENCOUNTER — Ambulatory Visit (INDEPENDENT_AMBULATORY_CARE_PROVIDER_SITE_OTHER): Payer: BC Managed Care – PPO

## 2020-02-08 VITALS — Temp 96.8°F

## 2020-02-08 DIAGNOSIS — C679 Malignant neoplasm of bladder, unspecified: Secondary | ICD-10-CM

## 2020-02-08 LAB — POCT URINALYSIS DIPSTICK
Glucose, UA: NEGATIVE
Ketones, UA: NEGATIVE
Leukocytes, UA: NEGATIVE
Nitrite, UA: NEGATIVE
Protein, UA: POSITIVE — AB
Spec Grav, UA: >=1.03 — AB (ref 1.010–1.025)
Urobilinogen, UA: NEGATIVE E.U./dL — AB
pH, UA: 6 (ref 5.0–8.0)

## 2020-02-08 NOTE — Progress Notes (Signed)
BCG Bladder Instillation  BCG # 2 of 3   Due to Bladder Cancer patient is present today for a BCG treatment. Patient was cleaned and prepped in a sterile fashion with betadine. A 16FR catheter was inserted, urine return was noted 9ml, urine was dk yellow in color.  10ml of reconstituted BCG was instilled into the bladder. The catheter was then removed. Patient tolerated well, no complications were noted  Preformed by: d,Foxx Klarich,lpn   Follow up/ Additional notes: [place]

## 2020-02-08 NOTE — Addendum Note (Signed)
Addended by: Valentina Lucks on: 02/08/2020 03:59 PM   Modules accepted: Level of Service

## 2020-02-09 DIAGNOSIS — K279 Peptic ulcer, site unspecified, unspecified as acute or chronic, without hemorrhage or perforation: Secondary | ICD-10-CM | POA: Diagnosis not present

## 2020-02-14 ENCOUNTER — Telehealth: Payer: Self-pay | Admitting: Urology

## 2020-02-14 NOTE — Telephone Encounter (Signed)
I spoke with Dr. Diona Fanti who would like the patient to be seen tomorrow post BCG issues in [facility]. Spoke with Lattie Haw in [place] and will possibly schedule pt tomorrow for PA to see for evaluation. Wife notified. Reports no change in symptoms. Still having frequency. Wife will return my call in am to see about appt in [facility].

## 2020-02-14 NOTE — Telephone Encounter (Signed)
Patients wife called and is c/o frequent urination (every 45 minutes day and night) since BCG treatment last week. Dr Gloriann Loan called in antibiotic over the weekend but pt is still going. She asks that a nurse return her call regarding this situation.

## 2020-02-14 NOTE — Telephone Encounter (Signed)
I spoke with wife. Reports frequency has been increased since last BCG treatment. Dr. Gloriann Loan prescribed abx over weekend after wife called on call MD. He is scheduled for bcg on this Wednesday. We are out of office until Wednesday. Pt has tried oxybutynin in past and that put pt in bladder retention. Denies any pain wife reports this frequency is worse than normal.

## 2020-02-14 NOTE — Telephone Encounter (Signed)
Isnt frequency a normal side effect?

## 2020-02-15 ENCOUNTER — Ambulatory Visit: Payer: BC Managed Care – PPO

## 2020-02-15 DIAGNOSIS — N3000 Acute cystitis without hematuria: Secondary | ICD-10-CM | POA: Diagnosis not present

## 2020-02-16 ENCOUNTER — Ambulatory Visit: Payer: BC Managed Care – PPO | Admitting: Urology

## 2020-02-18 ENCOUNTER — Ambulatory Visit: Payer: BC Managed Care – PPO

## 2020-03-02 ENCOUNTER — Encounter: Payer: BC Managed Care – PPO | Admitting: Family Medicine

## 2020-03-06 ENCOUNTER — Encounter: Payer: Self-pay | Admitting: Family Medicine

## 2020-03-06 ENCOUNTER — Other Ambulatory Visit: Payer: Self-pay

## 2020-03-06 ENCOUNTER — Ambulatory Visit (INDEPENDENT_AMBULATORY_CARE_PROVIDER_SITE_OTHER): Payer: BC Managed Care – PPO | Admitting: Family Medicine

## 2020-03-06 VITALS — BP 118/76 | HR 90 | Temp 98.1°F | Ht 71.0 in | Wt 232.4 lb

## 2020-03-06 DIAGNOSIS — Z0000 Encounter for general adult medical examination without abnormal findings: Secondary | ICD-10-CM

## 2020-03-06 DIAGNOSIS — C679 Malignant neoplasm of bladder, unspecified: Secondary | ICD-10-CM | POA: Diagnosis not present

## 2020-03-06 DIAGNOSIS — Z111 Encounter for screening for respiratory tuberculosis: Secondary | ICD-10-CM

## 2020-03-06 NOTE — Progress Notes (Addendum)
Subjective:    Patient ID: Barry Gibson, male    DOB: 1960/12/17, 59 y.o.   MRN: [id number]  HPI The patient comes in today for a wellness visit and physical for work.  A review of their health history was completed.  A review of medications was also completed.  Any needed refills; none  Eating habits: pretty good  Falls/  MVA accidents in past few months: none  Regular exercise: walking  Specialist pt sees on regular basis: urologist for bladder cancer  Preventative health issues were discussed.   Additional concerns:  none  Negative for depression or SI.   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  Brought physical form to be filled out. No h/o TB or positive ppd.  Pt stating is up to date on MMR and Hep B vaccines per pt. Tdap in our system on 10/29/18.  Vitals:   03/06/20 1636 03/06/20 1646  BP: 118/76 118/76  Pulse: 90 90  Temp:    SpO2:       Review of Systems  Constitutional: Negative for chills and fever.  HENT: Negative for congestion, rhinorrhea and sore throat.   Eyes: Negative.   Respiratory: Negative for cough, shortness of breath and wheezing.   Cardiovascular: Negative for chest pain and leg swelling.  Gastrointestinal: Negative for abdominal pain, diarrhea, nausea and vomiting.  Genitourinary: Negative for dysuria and frequency.  Musculoskeletal: Negative.   Skin: Negative for rash.  Neurological: Negative for dizziness, weakness and headaches.       Objective:   Physical Exam Constitutional:      General: He is not in acute distress.    Appearance: Normal appearance. He is not toxic-appearing.  HENT:     Head: Normocephalic.     Right Ear: Tympanic membrane, ear canal and external ear normal.     Left Ear: External ear normal. There is impacted cerumen.     Nose: Nose normal. No congestion or rhinorrhea.     Mouth/Throat:     Mouth: Mucous membranes are moist.     Pharynx: No oropharyngeal exudate or posterior  oropharyngeal erythema.  Eyes:     Extraocular Movements: Extraocular movements intact.     Conjunctiva/sclera: Conjunctivae normal.     Pupils: Pupils are equal, round, and reactive to light.  Cardiovascular:     Rate and Rhythm: Normal rate and regular rhythm.     Pulses: Normal pulses.     Heart sounds: Normal heart sounds. No murmur.  Pulmonary:     Effort: Pulmonary effort is normal.     Breath sounds: Normal breath sounds. No wheezing, rhonchi or rales.  Abdominal:     General: Abdomen is flat.     Palpations: Abdomen is soft.  Musculoskeletal:        General: Normal range of motion.     Cervical back: Normal range of motion.     Right lower leg: No edema.     Left lower leg: No edema.  Skin:    General: Skin is warm and dry.     Findings: No rash.  Neurological:     General: No focal deficit present.     Mental Status: He is alert and oriented to person, place, and time.     Cranial Nerves: No cranial nerve deficit.     Sensory: No sensory deficit.  Psychiatric:        Mood and Affect: Mood normal.        Behavior: Behavior  normal.        Assessment & Plan:   1. Encounter for well adult exam without abnormal findings  2. Screening-pulmonary TB - PPD  3. Malignant neoplasm of urinary bladder, unspecified site ([place])  Pt to return in 48-72 hrs to read the TST.  Pt will bring the physical sheet to result the TST and signature. Pt had labs in 12/20.  Labs reviewed and stable.   Pt in agreement. F/u prn.

## 2020-03-07 ENCOUNTER — Ambulatory Visit: Payer: BC Managed Care – PPO | Admitting: Urology

## 2020-03-08 LAB — TB SKIN TEST
Induration: 0 mm
TB Skin Test: NEGATIVE

## 2020-03-14 ENCOUNTER — Other Ambulatory Visit: Payer: BC Managed Care – PPO | Admitting: Urology

## 2020-03-20 ENCOUNTER — Telehealth: Payer: Self-pay | Admitting: Urology

## 2020-03-20 NOTE — Telephone Encounter (Signed)
Offered appt tomorrow am at 10. Pt unsure if he can come. Will call back if able to

## 2020-03-20 NOTE — Telephone Encounter (Signed)
Pts wife states he is having issues and she wants him to come in tomorrow instead of 03/28/20.

## 2020-03-21 ENCOUNTER — Ambulatory Visit: Payer: BC Managed Care – PPO | Admitting: Urology

## 2020-03-28 ENCOUNTER — Other Ambulatory Visit: Payer: Self-pay

## 2020-03-28 ENCOUNTER — Ambulatory Visit (INDEPENDENT_AMBULATORY_CARE_PROVIDER_SITE_OTHER): Payer: BC Managed Care – PPO | Admitting: Urology

## 2020-03-28 ENCOUNTER — Encounter: Payer: Self-pay | Admitting: Urology

## 2020-03-28 VITALS — BP 138/95 | HR 84 | Temp 97.7°F | Ht 71.0 in | Wt 230.0 lb

## 2020-03-28 DIAGNOSIS — C679 Malignant neoplasm of bladder, unspecified: Secondary | ICD-10-CM | POA: Diagnosis not present

## 2020-03-28 LAB — POCT URINALYSIS DIPSTICK
Bilirubin, UA: NEGATIVE
Glucose, UA: NEGATIVE
Ketones, UA: NEGATIVE
Nitrite, UA: NEGATIVE
Protein, UA: POSITIVE — AB
Spec Grav, UA: 1.025 (ref 1.010–1.025)
Urobilinogen, UA: NEGATIVE E.U./dL — AB
pH, UA: 6 (ref 5.0–8.0)

## 2020-03-28 MED ORDER — CIPROFLOXACIN HCL 500 MG PO TABS
500.0000 mg | ORAL_TABLET | Freq: Once | ORAL | Status: AC
  Administered 2020-03-28: 500 mg via ORAL

## 2020-03-28 NOTE — Progress Notes (Signed)
History of Present Illness: Barry Gibson is here today for follow-up of urothelial carcinoma the bladder.  He has high-grade nonmuscle invasive bladder cancer.   9.17.2020: TURBT. Multifocal lesions seen, most on left bladder wall. Small perforation on left d/t obturator reflex. Gemzar not placed. He experienced postop retention following cath removal necessitating repeat catheter placement. Path--HG NMIBC (St T1), muscularis present.   10.27.2020: He underwent 2nd TURBT. 2 lesions resected-left anterior bladder neck and left bladder wall. Pathology revealed persistent high-grade non muscle invasive bladder cancer. Gemcitabine placed following procedure.   12.15.2020: Completed 6 wks of BCG induction   1.12.2021: Cystoscopy revealed no overt recurrence.  Cytology revealed atypia.  3.23.2021: Completed second of 3 BCG maintenance treatments.  He could not tolerate a third.  5.11.2021: He presents for cystoscopy.  He does state that he did not tolerate BCG as it caused significant fatigue.  He is having intermittent gross painless hematuria, especially when he does heavy activities.  He has urinary frequency urgency and slow stream despite being on 2 tamsulosin a day.  Past Medical History:  Diagnosis Date  . Cataract   . Full dentures   . GERD (gastroesophageal reflux disease)   . Hypertension   . Rheumatoid arthritis [facility])     Past Surgical History:  Procedure Laterality Date  . CORNEAL TRANSPLANT Left 2013  . CYSTOSCOPY WITH BIOPSY N/A 09/14/2019   Procedure: CYSTOSCOPY WITH BIOPSY;  Surgeon: Franchot Gallo, MD;  Location: [facility];  Service: Urology;  Laterality: N/A;  . FULGURATION OF BLADDER TUMOR N/A 09/14/2019   Procedure: FULGURATION OF BLADDER TUMOR;  Surgeon: Franchot Gallo, MD;  Location: [facility];  Service: Urology;  Laterality: N/A;  . TRANSURETHRAL RESECTION OF BLADDER TUMOR WITH MITOMYCIN-C N/A 08/05/2019   Procedure: TRANSURETHRAL RESECTION OF BLADDER TUMOR;   Surgeon: Franchot Gallo, MD;  Location: [facility];  Service: Urology;  Laterality: N/A;  1 HR    Home Medications:  Allergies as of 03/28/2020      Reactions   Ace Inhibitors Cough      Medication List       Accurate as of Mar 28, 2020  4:53 PM. If you have any questions, ask your nurse or doctor.        acetaminophen 650 MG CR tablet Commonly known as: TYLENOL Take 650 mg by mouth every 8 (eight) hours as needed for pain.   chlorthalidone 25 MG tablet Commonly known as: HYGROTON Take 0.5 tablets (12.5 mg total) by mouth daily.   finasteride 5 MG tablet Commonly known as: PROSCAR Take 5 mg by mouth daily.   Humira Pen 40 MG/0.4ML Pnkt Generic drug: Adalimumab Inject 40 mg into the skin every 14 (fourteen) days.   leflunomide 20 MG tablet Commonly known as: ARAVA Take 20 mg by mouth daily.   losartan 100 MG tablet Commonly known as: COZAAR Take 1 tablet (100 mg total) by mouth daily.   omeprazole 40 MG capsule Commonly known as: PRILOSEC Take 40 mg by mouth daily.   pravastatin 40 MG tablet Commonly known as: PRAVACHOL Take 1 tablet (40 mg total) by mouth daily.   prednisoLONE acetate 1 % ophthalmic suspension Commonly known as: PRED FORTE Place 1 drop into both eyes 2 (two) times a week.   sildenafil 100 MG tablet Commonly known as: VIAGRA Take 1 tablet (100 mg total) by mouth daily as needed for erectile dysfunction.   sildenafil 20 MG tablet Commonly known as: REVATIO Take two to three tablet by mouth  2 hours prior to sex   tamsulosin 0.4 MG Caps capsule Commonly known as: FLOMAX TAKE 2 CAPSULES BY MOUTH  DAILY       Allergies:  Allergies  Allergen Reactions  . Ace Inhibitors Cough    History reviewed. No pertinent family history.  Social History:  reports that he has quit smoking. His smoking use included cigarettes. He has never used smokeless tobacco. He reports current alcohol use. He reports that he does not use  drugs.  ROS: Urinary frequency, urgency, hematuria.  No weight loss, no abdominal or back pain.  Physical Exam:  Vital signs in last 24 hours: BP (!) 138/95   Pulse 84   Temp 97.7 F (36.5 C)   Ht 5\' 11"  (1.803 m)   Wt 230 lb (104.3 kg)   BMI 32.08 kg/m  Constitutional:  Alert and oriented, No acute distress Cardiovascular: Regular rate  Genitourinary: Normal male phallus, testes are descended bilaterally and non-tender and without masses, scrotum is normal in appearance without lesions or masses, perineum is normal on inspection. Lymphatic: No lymphadenopathy Neurologic: Grossly intact, no focal deficits Psychiatric: Normal mood and affect   Results for orders placed or performed in visit on 03/28/20 (from the past 24 hour(s))  POCT urinalysis dipstick     Status: Abnormal   Collection Time: 03/28/20  4:05 PM  Result Value Ref Range   Color, UA red    Clarity, UA cloudy    Glucose, UA Negative Negative   Bilirubin, UA neg    Ketones, UA neg    Spec Grav, UA 1.025 1.010 - 1.025   Blood, UA large    pH, UA 6.0 5.0 - 8.0   Protein, UA Positive (A) Negative   Urobilinogen, UA negative (A) 0.2 or 1.0 E.U./dL   Nitrite, UA neg    Leukocytes, UA Trace (A) Negative   Appearance cloudy    Odor     No results found for this or any previous visit (from the past 240 hour(s)).  Cystoscopy Procedure Note:  Indication: Follow-up of bladder cancer  After informed consent and discussion of the procedure and its risks, Barry Gibson was positioned and prepped in the standard fashion. Cystoscopy was performed with a flexible cystoscope. The urethra, bladder neck and entire bladder was visualized in a standard fashion. The prostate was obstructive with bilobar hypertrophy.  There was a papillary tumor at the right bladder neck located more anteriorly.. The ureteral orifices were visualized in their normal location and orientation.  There was raised urothelium on the right bladder  neck and the lateral wall of the bladder consistent with probable recurrence of his urothelial carcinoma.  1 or 2 other areas on the left bladder wall also seem to be involved.  Visualization was somewhat difficult due to mild hematuria.    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  I have recommended we place gemcitabine postoperatively.

## 2020-03-28 NOTE — Progress Notes (Signed)
See progress notes

## 2020-04-03 ENCOUNTER — Other Ambulatory Visit: Payer: Self-pay | Admitting: Urology

## 2020-04-04 ENCOUNTER — Other Ambulatory Visit: Payer: Self-pay | Admitting: Urology

## 2020-04-04 ENCOUNTER — Other Ambulatory Visit: Payer: Self-pay | Admitting: *Deleted

## 2020-04-04 ENCOUNTER — Telehealth: Payer: Self-pay | Admitting: Family Medicine

## 2020-04-04 MED ORDER — TAMSULOSIN HCL 0.4 MG PO CAPS: 0.8000 mg | ORAL_CAPSULE | Freq: Every day | ORAL | 1 refills | Status: DC

## 2020-04-04 MED ORDER — CHLORTHALIDONE 25 MG PO TABS: 12.5000 mg | ORAL_TABLET | Freq: Every day | ORAL | 1 refills | Status: DC

## 2020-04-04 MED ORDER — LOSARTAN POTASSIUM 100 MG PO TABS: 100.0000 mg | ORAL_TABLET | Freq: Every day | ORAL | 1 refills | Status: DC

## 2020-04-04 MED ORDER — GEMCITABINE CHEMO FOR BLADDER INSTILLATION 2000 MG: 2000.0000 mg | Freq: Once | INTRAVENOUS | Status: AC

## 2020-04-04 MED ORDER — PRAVASTATIN SODIUM 40 MG PO TABS: 40.0000 mg | ORAL_TABLET | Freq: Every day | ORAL | 1 refills | Status: DC

## 2020-04-04 NOTE — Telephone Encounter (Signed)
Pt last seen 03/06/20 by Dr.Taylor for wellness. Please advise. Thank you

## 2020-04-04 NOTE — Telephone Encounter (Signed)
Refills sent and pt was notified.  

## 2020-04-04 NOTE — Telephone Encounter (Signed)
Patient is requesting refill on chlorthalidone 25 mg called into [facility] [address]. Patient would like all his medications switched to [facility] due to insurance changed.

## 2020-04-04 NOTE — Telephone Encounter (Signed)
Ok six mo worth 

## 2020-04-14 ENCOUNTER — Encounter (HOSPITAL_BASED_OUTPATIENT_CLINIC_OR_DEPARTMENT_OTHER): Payer: Self-pay | Admitting: Urology

## 2020-04-14 ENCOUNTER — Other Ambulatory Visit: Payer: Self-pay

## 2020-04-14 NOTE — Progress Notes (Addendum)
Spoke w/ via phone for pre-op interview---wife jeana and patient Lab needs dos---I stat 8     Lab results: ekg 09-14-2019 epic              COVID test ------04-22-2020@1000  am Arrive at -------715 am 04-24-2020 NPO after ------midnight Medications to take morning of surgery -----omeprazole Diabetic medication -----n/a Patient Special Instructions -----none Pre-Op special Istructions -----none Patient verbalized understanding of instructions that were given at this phone interview. Patient denies shortness of breath, chest pain, fever, cough a this phone interview.

## 2020-04-22 ENCOUNTER — Other Ambulatory Visit (HOSPITAL_COMMUNITY)
Admission: RE | Admit: 2020-04-22 | Discharge: 2020-04-22 | Disposition: A | Discharge status: Home / Self Care | Payer: BC Managed Care – PPO | Source: Ambulatory Visit | Attending: Urology | Admitting: Urology

## 2020-04-22 DIAGNOSIS — Z20822 Contact with and (suspected) exposure to covid-19: Secondary | ICD-10-CM | POA: Insufficient documentation

## 2020-04-22 DIAGNOSIS — Z01812 Encounter for preprocedural laboratory examination: Secondary | ICD-10-CM | POA: Insufficient documentation

## 2020-04-22 LAB — SARS CORONAVIRUS 2 (TAT 6-24 HRS): SARS Coronavirus 2: NEGATIVE

## 2020-04-24 ENCOUNTER — Other Ambulatory Visit: Payer: Self-pay

## 2020-04-24 ENCOUNTER — Ambulatory Visit (HOSPITAL_BASED_OUTPATIENT_CLINIC_OR_DEPARTMENT_OTHER): Payer: BC Managed Care – PPO | Admitting: Anesthesiology

## 2020-04-24 ENCOUNTER — Ambulatory Visit (HOSPITAL_BASED_OUTPATIENT_CLINIC_OR_DEPARTMENT_OTHER)
Admission: RE | Admit: 2020-04-24 | Discharge: 2020-04-24 | Disposition: A | Discharge status: Home / Self Care | Payer: BC Managed Care – PPO | Attending: Urology | Admitting: Urology

## 2020-04-24 ENCOUNTER — Encounter (HOSPITAL_BASED_OUTPATIENT_CLINIC_OR_DEPARTMENT_OTHER)
Admission: RE | Disposition: A | Discharge status: Home / Self Care | Payer: Self-pay | Source: Home / Self Care | Attending: Urology

## 2020-04-24 ENCOUNTER — Encounter (HOSPITAL_BASED_OUTPATIENT_CLINIC_OR_DEPARTMENT_OTHER): Payer: Self-pay | Admitting: Urology

## 2020-04-24 DIAGNOSIS — C678 Malignant neoplasm of overlapping sites of bladder: Secondary | ICD-10-CM | POA: Insufficient documentation

## 2020-04-24 DIAGNOSIS — C679 Malignant neoplasm of bladder, unspecified: Secondary | ICD-10-CM | POA: Diagnosis present

## 2020-04-24 DIAGNOSIS — I10 Essential (primary) hypertension: Secondary | ICD-10-CM | POA: Insufficient documentation

## 2020-04-24 DIAGNOSIS — M069 Rheumatoid arthritis, unspecified: Secondary | ICD-10-CM | POA: Diagnosis not present

## 2020-04-24 DIAGNOSIS — Z87891 Personal history of nicotine dependence: Secondary | ICD-10-CM | POA: Diagnosis not present

## 2020-04-24 DIAGNOSIS — Z888 Allergy status to other drugs, medicaments and biological substances status: Secondary | ICD-10-CM | POA: Diagnosis not present

## 2020-04-24 DIAGNOSIS — Z8616 Personal history of COVID-19: Secondary | ICD-10-CM | POA: Diagnosis not present

## 2020-04-24 DIAGNOSIS — Z947 Corneal transplant status: Secondary | ICD-10-CM | POA: Insufficient documentation

## 2020-04-24 DIAGNOSIS — K219 Gastro-esophageal reflux disease without esophagitis: Secondary | ICD-10-CM | POA: Insufficient documentation

## 2020-04-24 HISTORY — DX: Malignant (primary) neoplasm, unspecified: C80.1

## 2020-04-24 HISTORY — DX: Frequency of micturition: R35.0

## 2020-04-24 HISTORY — PX: TRANSURETHRAL RESECTION OF BLADDER TUMOR WITH MITOMYCIN-C: SHX6459

## 2020-04-24 HISTORY — DX: Endothelial corneal dystrophy, unspecified eye: H18.519

## 2020-04-24 LAB — POCT I-STAT, CHEM 8
BUN: 23 mg/dL — ABNORMAL HIGH (ref 6–20)
Calcium, Ion: 1.07 mmol/L — ABNORMAL LOW (ref 1.15–1.40)
Chloride: 104 mmol/L (ref 98–111)
Creatinine, Ser: 1.1 mg/dL (ref 0.61–1.24)
Glucose, Bld: 106 mg/dL — ABNORMAL HIGH (ref 70–99)
HCT: 42 % (ref 39.0–52.0)
Hemoglobin: 14.3 g/dL (ref 13.0–17.0)
Potassium: 3.1 mmol/L — ABNORMAL LOW (ref 3.5–5.1)
Sodium: 143 mmol/L (ref 135–145)
TCO2: 27 mmol/L (ref 22–32)

## 2020-04-24 SURGERY — TRANSURETHRAL RESECTION OF BLADDER TUMOR WITH MITOMYCIN-C
Anesthesia: General | Site: Bladder

## 2020-04-24 MED ORDER — MIDAZOLAM HCL 5 MG/5ML IJ SOLN
INTRAMUSCULAR | Status: DC | PRN
  Administered 2020-04-24: 2 mg via INTRAVENOUS

## 2020-04-24 MED ORDER — DEXAMETHASONE SODIUM PHOSPHATE 4 MG/ML IJ SOLN
INTRAMUSCULAR | Status: DC | PRN
  Administered 2020-04-24: 10 mg via INTRAVENOUS

## 2020-04-24 MED ORDER — ONDANSETRON HCL 4 MG/2ML IJ SOLN
INTRAMUSCULAR | Status: AC
  Filled 2020-04-24: qty 2

## 2020-04-24 MED ORDER — STERILE WATER FOR IRRIGATION IR SOLN
Status: DC | PRN
  Administered 2020-04-24: 500 mL

## 2020-04-24 MED ORDER — TRAMADOL HCL 50 MG PO TABS: 50.0000 mg | ORAL_TABLET | Freq: Four times a day (QID) | ORAL | 0 refills | Status: DC | PRN

## 2020-04-24 MED ORDER — CEFAZOLIN SODIUM-DEXTROSE 2-4 GM/100ML-% IV SOLN
2.0000 g | INTRAVENOUS | Status: AC
  Administered 2020-04-24: 2 g via INTRAVENOUS

## 2020-04-24 MED ORDER — PROPOFOL 10 MG/ML IV BOLUS
INTRAVENOUS | Status: DC | PRN
  Administered 2020-04-24: 200 mg via INTRAVENOUS

## 2020-04-24 MED ORDER — KETOROLAC TROMETHAMINE 30 MG/ML IJ SOLN
INTRAMUSCULAR | Status: AC
  Filled 2020-04-24: qty 1

## 2020-04-24 MED ORDER — ACETAMINOPHEN 160 MG/5ML PO SOLN: 325.0000 mg | Freq: Once | ORAL | Status: DC | PRN

## 2020-04-24 MED ORDER — LACTATED RINGERS IV SOLN
INTRAVENOUS | Status: DC
  Administered 2020-04-24: 1000 mL via INTRAVENOUS

## 2020-04-24 MED ORDER — OXYCODONE HCL 5 MG PO TABS: 5.0000 mg | ORAL_TABLET | Freq: Once | ORAL | Status: DC | PRN

## 2020-04-24 MED ORDER — FENTANYL CITRATE (PF) 100 MCG/2ML IJ SOLN
INTRAMUSCULAR | Status: AC
  Filled 2020-04-24: qty 2

## 2020-04-24 MED ORDER — FENTANYL CITRATE (PF) 250 MCG/5ML IJ SOLN
INTRAMUSCULAR | Status: AC
  Filled 2020-04-24: qty 5

## 2020-04-24 MED ORDER — PROPOFOL 10 MG/ML IV BOLUS
INTRAVENOUS | Status: AC
  Filled 2020-04-24: qty 20

## 2020-04-24 MED ORDER — SODIUM CHLORIDE 0.9 % IR SOLN
Status: DC | PRN
  Administered 2020-04-24: 3000 mL via INTRAVESICAL
  Administered 2020-04-24: 6000 mL via INTRAVESICAL

## 2020-04-24 MED ORDER — MIDAZOLAM HCL 2 MG/2ML IJ SOLN
INTRAMUSCULAR | Status: AC
  Filled 2020-04-24: qty 2

## 2020-04-24 MED ORDER — DEXAMETHASONE SODIUM PHOSPHATE 10 MG/ML IJ SOLN
INTRAMUSCULAR | Status: AC
  Filled 2020-04-24: qty 1

## 2020-04-24 MED ORDER — LIDOCAINE HCL (CARDIAC) PF 100 MG/5ML IV SOSY
PREFILLED_SYRINGE | INTRAVENOUS | Status: DC | PRN
  Administered 2020-04-24: 60 mg via INTRAVENOUS

## 2020-04-24 MED ORDER — SULFAMETHOXAZOLE-TRIMETHOPRIM 800-160 MG PO TABS: 1.0000 | ORAL_TABLET | Freq: Two times a day (BID) | ORAL | 0 refills | Status: DC

## 2020-04-24 MED ORDER — OXYCODONE HCL 5 MG/5ML PO SOLN: 5.0000 mg | Freq: Once | ORAL | Status: DC | PRN

## 2020-04-24 MED ORDER — KETOROLAC TROMETHAMINE 30 MG/ML IJ SOLN
INTRAMUSCULAR | Status: DC | PRN
Start: 2020-04-24 — End: 2020-04-24
  Administered 2020-04-24: 30 mg via INTRAVENOUS

## 2020-04-24 MED ORDER — GEMCITABINE CHEMO FOR BLADDER INSTILLATION 2000 MG
2000.0000 mg | Freq: Once | INTRAVENOUS | Status: AC
  Administered 2020-04-24: 2000 mg via INTRAVESICAL
  Filled 2020-04-24: qty 52.6
  Filled 2020-04-24: qty 2000

## 2020-04-24 MED ORDER — LIDOCAINE 2% (20 MG/ML) 5 ML SYRINGE
INTRAMUSCULAR | Status: AC
  Filled 2020-04-24: qty 5

## 2020-04-24 MED ORDER — MEPERIDINE HCL 25 MG/ML IJ SOLN: 6.2500 mg | INTRAMUSCULAR | Status: DC | PRN

## 2020-04-24 MED ORDER — CEFAZOLIN SODIUM-DEXTROSE 2-4 GM/100ML-% IV SOLN
INTRAVENOUS | Status: AC
  Filled 2020-04-24: qty 100

## 2020-04-24 MED ORDER — BELLADONNA ALKALOIDS-OPIUM 16.2-30 MG RE SUPP
RECTAL | Status: AC
  Filled 2020-04-24: qty 1

## 2020-04-24 MED ORDER — PROMETHAZINE HCL 25 MG/ML IJ SOLN: 6.2500 mg | INTRAMUSCULAR | Status: DC | PRN

## 2020-04-24 MED ORDER — HYDROMORPHONE HCL 1 MG/ML IJ SOLN: 0.2500 mg | INTRAMUSCULAR | Status: DC | PRN

## 2020-04-24 MED ORDER — ONDANSETRON HCL 4 MG/2ML IJ SOLN
INTRAMUSCULAR | Status: DC | PRN
  Administered 2020-04-24: 4 mg via INTRAVENOUS

## 2020-04-24 MED ORDER — LACTATED RINGERS IV SOLN
INTRAVENOUS | Status: DC
  Administered 2020-04-24: 50 mL/h via INTRAVENOUS

## 2020-04-24 MED ORDER — FENTANYL CITRATE (PF) 100 MCG/2ML IJ SOLN
INTRAMUSCULAR | Status: DC | PRN
  Administered 2020-04-24 (×3): 25 ug via INTRAVENOUS
  Administered 2020-04-24: 50 ug via INTRAVENOUS
  Administered 2020-04-24 (×2): 25 ug via INTRAVENOUS
  Administered 2020-04-24 (×2): 50 ug via INTRAVENOUS
  Administered 2020-04-24: 25 ug via INTRAVENOUS

## 2020-04-24 MED ORDER — BELLADONNA ALKALOIDS-OPIUM 16.2-60 MG RE SUPP
1.0000 | Freq: Once | RECTAL | Status: AC
  Administered 2020-04-24: 1 via RECTAL

## 2020-04-24 MED ORDER — ACETAMINOPHEN 325 MG PO TABS: 325.0000 mg | ORAL_TABLET | Freq: Once | ORAL | Status: DC | PRN

## 2020-04-24 MED ORDER — ACETAMINOPHEN 10 MG/ML IV SOLN: 1000.0000 mg | Freq: Once | INTRAVENOUS | Status: DC | PRN

## 2020-04-24 SURGICAL SUPPLY — 31 items
BAG DRAIN URO-CYSTO SKYTR STRL (DRAIN) ×2 IMPLANT
BAG URINE DRAIN 2000ML AR STRL (UROLOGICAL SUPPLIES) ×1 IMPLANT
BAG URINE LEG 500ML (DRAIN) IMPLANT
CATH FOLEY 2WAY SLVR  5CC 20FR (CATHETERS) ×1
CATH FOLEY 2WAY SLVR  5CC 22FR (CATHETERS)
CATH FOLEY 2WAY SLVR 5CC 20FR (CATHETERS) IMPLANT
CATH FOLEY 2WAY SLVR 5CC 22FR (CATHETERS) IMPLANT
CATH FOLEY 3WAY 30CC 22F (CATHETERS) IMPLANT
CLOTH BEACON ORANGE TIMEOUT ST (SAFETY) ×2 IMPLANT
ELECT REM PT RETURN 9FT ADLT (ELECTROSURGICAL) ×2
ELECTRODE REM PT RTRN 9FT ADLT (ELECTROSURGICAL) ×1 IMPLANT
EVACUATOR MICROVAS BLADDER (UROLOGICAL SUPPLIES) IMPLANT
GLOVE BIO SURGEON STRL SZ8 (GLOVE) ×2 IMPLANT
GLOVE BIOGEL PI IND STRL 7.5 (GLOVE) IMPLANT
GLOVE BIOGEL PI INDICATOR 7.5 (GLOVE) ×2
GOWN STRL REUS W/TWL LRG LVL3 (GOWN DISPOSABLE) ×1 IMPLANT
GOWN STRL REUS W/TWL XL LVL3 (GOWN DISPOSABLE) ×2 IMPLANT
HOLDER FOLEY CATH W/STRAP (MISCELLANEOUS) ×1 IMPLANT
IV NS IRRIG 3000ML ARTHROMATIC (IV SOLUTION) ×4 IMPLANT
KIT TURNOVER CYSTO (KITS) ×2 IMPLANT
LOOP CUT BIPOLAR 24F LRG (ELECTROSURGICAL) ×2 IMPLANT
MANIFOLD NEPTUNE II (INSTRUMENTS) ×2 IMPLANT
NS IRRIG 1000ML POUR BTL (IV SOLUTION) ×1 IMPLANT
NS IRRIG 500ML POUR BTL (IV SOLUTION) ×1 IMPLANT
PLUG CATH AND CAP STER (CATHETERS) IMPLANT
SYR TOOMEY IRRIG 70ML (MISCELLANEOUS) ×2
SYRINGE TOOMEY IRRIG 70ML (MISCELLANEOUS) ×1 IMPLANT
TRAY CYSTO PACK (CUSTOM PROCEDURE TRAY) ×2 IMPLANT
TUBE CONNECTING 12X1/4 (SUCTIONS) ×2 IMPLANT
TUBING UROLOGY SET (TUBING) ×1 IMPLANT
WATER STERILE IRR 500ML POUR (IV SOLUTION) ×1 IMPLANT

## 2020-04-24 NOTE — Discharge Instructions (Signed)
Indwelling Urinary Catheter Care, Adult An indwelling urinary catheter is a thin tube that is put into your bladder. The tube helps to drain pee (urine) out of your body. The tube goes in through your urethra. Your urethra is where pee comes out of your body. Your pee will come out through the catheter, then it will go into a bag (drainage bag). Take good care of your catheter so it will work well. How to wear your catheter and bag Supplies needed  Sticky tape (adhesive tape) or a leg strap.  Alcohol wipe or soap and water (if you use tape).  A clean towel (if you use tape).  Large overnight bag.  Smaller bag (leg bag). Wearing your catheter Attach your catheter to your leg with tape or a leg strap.  Make sure the catheter is not pulled tight.  If a leg strap gets wet, take it off and put on a dry strap.  If you use tape to hold the bag on your leg: 1. Use an alcohol wipe or soap and water to wash your skin where the tape made it sticky before. 2. Use a clean towel to pat-dry that skin. 3. Use new tape to make the bag stay on your leg. Wearing your bags You should have been given a large overnight bag.  You may wear the overnight bag in the day or night.  Always have the overnight bag lower than your bladder.  Do not let the bag touch the floor.  Before you go to sleep, put a clean plastic bag in a wastebasket. Then hang the overnight bag inside the wastebasket. You should also have a smaller leg bag that fits under your clothes.  Always wear the leg bag below your knee.  Do not wear your leg bag at night. How to care for your skin and catheter Supplies needed  A clean washcloth.  Water and mild soap.  A clean towel. Caring for your skin and catheter      Clean the skin around your catheter every day: 1. Wash your hands with soap and water. 2. Wet a clean washcloth in warm water and mild soap. 3. Clean the skin around your urethra.  If you are  male:  Gently spread the folds of skin around your vagina (labia).  With the washcloth in your other hand, wipe the inner side of your labia on each side. Wipe from front to back.  If you are male:  Pull back any skin that covers the end of your penis (foreskin).  With the washcloth in your other hand, wipe your penis in small circles. Start wiping at the tip of your penis, then move away from the catheter.  Move the foreskin back in place, if needed. 4. With your free hand, hold the catheter close to where it goes into your body.  Keep holding the catheter during cleaning so it does not get pulled out. 5. With the washcloth in your other hand, clean the catheter.  Only wipe downward on the catheter.  Do not wipe upward toward your body. Doing this may push germs into your urethra and cause infection. 6. Use a clean towel to pat-dry the catheter and the skin around it. Make sure to wipe off all soap. 7. Wash your hands with soap and water.  Shower every day. Do not take baths.  Do not use cream, ointment, or lotion on the area where the catheter goes into your body, unless your doctor tells you   to.  Do not use powders, sprays, or lotions on your genital area.  Check your skin around the catheter every day for signs of infection. Check for: ? Redness, swelling, or pain. ? Fluid or blood. ? Warmth. ? Pus or a bad smell. How to empty the bag Supplies needed  Rubbing alcohol.  Gauze pad or cotton ball.  Tape or a leg strap. Emptying the bag Pour the pee out of your bag when it is ?- full, or at least 2-3 times a day. Do this for your overnight bag and your leg bag. 1. Wash your hands with soap and water. 2. Separate (detach) the bag from your leg. 3. Hold the bag over the toilet or a clean pail. Keep the bag lower than your hips and bladder. This is so the pee (urine) does not go back into the tube. 4. Open the pour spout. It is at the bottom of the bag. 5. Empty the  pee into the toilet or pail. Do not let the pour spout touch any surface. 6. Put rubbing alcohol on a gauze pad or cotton ball. 7. Use the gauze pad or cotton ball to clean the pour spout. 8. Close the pour spout. 9. Attach the bag to your leg with tape or a leg strap. 10. Wash your hands with soap and water. Follow instructions for cleaning the drainage bag:  From the product maker.  As told by your doctor. How to change the bag Supplies needed  Alcohol wipes.  A clean bag.  Tape or a leg strap. Changing the bag Replace your bag when it starts to leak, smell bad, or look dirty. 1. Wash your hands with soap and water. 2. Separate the dirty bag from your leg. 3. Pinch the catheter with your fingers so that pee does not spill out. 4. Separate the catheter tube from the bag tube where these tubes connect (at the connection valve). Do not let the tubes touch any surface. 5. Clean the end of the catheter tube with an alcohol wipe. Use a different alcohol wipe to clean the end of the bag tube. 6. Connect the catheter tube to the tube of the clean bag. 7. Attach the clean bag to your leg with tape or a leg strap. Do not make the bag tight on your leg. 8. Wash your hands with soap and water. General rules   Never pull on your catheter. Never try to take it out. Doing that can hurt you.  Always wash your hands before and after you touch your catheter or bag. Use a mild, fragrance-free soap. If you do not have soap and water, use hand sanitizer.  Always make sure there are no twists or bends (kinks) in the catheter tube.  Always make sure there are no leaks in the catheter or bag.  Drink enough fluid to keep your pee pale yellow.  Do not take baths, swim, or use a hot tub.  If you are male, wipe from front to back after you poop (have a bowel movement). Contact a doctor if:  Your pee is cloudy.  Your pee smells worse than usual.  Your catheter gets clogged.  Your catheter  leaks.  Your bladder feels full. Get help right away if:  You have redness, swelling, or pain where the catheter goes into your body.  You have fluid, blood, pus, or a bad smell coming from the area where the catheter goes into your body.  Your skin feels warm where   the catheter goes into your body.  You have a fever.  You have pain in your: ? Belly (abdomen). ? Legs. ? Lower back. ? Bladder.  You see blood in the catheter.  Your pee is pink or red.  You feel sick to your stomach (nauseous).  You throw up (vomit).  You have chills.  Your pee is not draining into the bag.  Your catheter gets pulled out. Summary  An indwelling urinary catheter is a thin tube that is placed into the bladder to help drain pee (urine) out of the body.  The catheter is placed into the part of the body that drains pee from the bladder (urethra).  Taking good care of your catheter will keep it working properly and help prevent problems.  Always wash your hands before and after touching your catheter or bag.  Never pull on your catheter or try to take it out. This information is not intended to replace advice given to you by your health care provider. Make sure you discuss any questions you have with your health care provider. Document Revised: 02/26/2019 Document Reviewed: 06/20/2017 Elsevier Patient Education  [place].  Transurethral Resection of Bladder Tumor, Care After This sheet gives you information about how to care for yourself after your procedure. Your health care provider may also give you more specific instructions. If you have problems or questions, contact your health care provider. What can I expect after the procedure? After the procedure, it is common to have:  A small amount of blood in your urine for up to 2 weeks.  Soreness or mild pain from your catheter. After your catheter is removed, you may have mild soreness, especially when urinating.  Pain in your  lower abdomen. Follow these instructions at home: Medicines   Take over-the-counter and prescription medicines only as told by your health care provider.  If you were prescribed an antibiotic medicine, take it as told by your health care provider. Do not stop taking the antibiotic even if you start to feel better.  Do not drive for 24 hours if you were given a sedative during your procedure.  Ask your health care provider if the medicine prescribed to you: ? Requires you to avoid driving or using heavy machinery. ? Can cause constipation. You may need to take these actions to prevent or treat constipation:  Take over-the-counter or prescription medicines.  Eat foods that are high in fiber, such as beans, whole grains, and fresh fruits and vegetables.  Limit foods that are high in fat and processed sugars, such as fried or sweet foods. Activity  Return to your normal activities as told by your health care provider. Ask your health care provider what activities are safe for you.  Do not lift anything that is heavier than 10 lb (4.5 kg), or the limit that you are told, until your health care provider says that it is safe.  Avoid intense physical activity for as long as told by your health care provider.  Rest as told by your health care provider.  Avoid sitting for a long time without moving. Get up to take short walks every 1-2 hours. This is important to improve blood flow and breathing. Ask for help if you feel weak or unsteady. General instructions   Do not drink alcohol for as long as told by your health care provider. This is especially important if you are taking prescription pain medicines.  Do not take baths, swim, or use a hot  tub until your health care provider approves. Ask your health care provider if you may take showers. You may only be allowed to take sponge baths.  If you have a catheter, follow instructions from your health care provider about caring for your  catheter and your drainage bag.  Drink enough fluid to keep your urine pale yellow.  Wear compression stockings as told by your health care provider. These stockings help to prevent blood clots and reduce swelling in your legs.  Keep all follow-up visits as told by your health care provider. This is important. ? You will need to be followed closely with regular checks of your bladder and urethra (cystoscopies) to make sure that the cancer does not come back. Contact a health care provider if:  You have pain that gets worse or does not improve with medicine.  You have blood in your urine for more than 2 weeks.  You have cloudy or bad-smelling urine.  You become constipated. Signs of constipation may include having: ? Fewer than three bowel movements in a week. ? Difficulty having a bowel movement. ? Stools that are dry, hard, or larger than normal.  You have a fever. Get help right away if:  You have: ? Severe pain. ? Bright red blood in your urine. ? Blood clots in your urine. ? A lot of blood in your urine.  Your catheter has been removed and you are not able to urinate.  You have a catheter in place and the catheter is not draining urine. Summary  After your procedure, it is common to have a small amount of blood in your urine, soreness or mild pain from your catheter, and pain in your lower abdomen.  Take over-the-counter and prescription medicines only as told by your health care provider.  Rest as told by your health care provider. Follow your health care provider's instructions about returning to normal activities. Ask what activities are safe for you.  If you have a catheter, follow instructions from your health care provider about caring for your catheter and your drainage bag.  Get help right away if you cannot urinate, you have severe pain, or you have bright red blood or blood clots in your urine. This information is not intended to replace advice given to you  by your health care provider. Make sure you discuss any questions you have with your health care provider. Document Revised: 06/04/2018 Document Reviewed: 06/04/2018 Elsevier Patient Education  2020 [place] may see some blood in the urine and may have some burning with urination for 48-72 hours. You also may notice that you have to urinate more frequently or urgently after your procedure which is normal.  2. You should call should you develop an inability urinate, fever > 101, persistent nausea and vomiting that prevents you from eating or drinking to stay hydrated.  3. If you have a stent, you will likely urinate more frequently and urgently until the stent is removed and you may experience some discomfort/pain in the lower abdomen and flank especially when urinating. You may take pain medication prescribed to you if needed for pain. You may also intermittently have blood in the urine until the stent is removed. 4. If you have a catheter, you will be taught how to take care of the catheter by the nursing staff prior to discharge from the hospital.  You may periodically feel a strong urge to void with the catheter in place.  This is a bladder spasm  and most often can occur when having a bowel movement or moving around. It is typically self-limited and usually will stop after a few minutes.  You may use some Vaseline or Neosporin around the tip of the catheter to reduce friction at the tip of the penis. You may also see some blood in the urine.  A very small amount of blood can make the urine look quite red.  As long as the catheter is draining well, there usually is not a problem.  However, if the catheter is not draining well and is bloody, you should call the office [phone]) to notify us.  If the urine is fairly clear on Tuesday morning, you can remove it as instructed.  Post Anesthesia Home Care Instructions  Activity: Get plenty of rest for the remainder of the day. A responsible  individual must stay with you for 24 hours following the procedure.  For the next 24 hours, DO NOT: -Drive a car -Paediatric nurse -Drink alcoholic beverages -Take any medication unless instructed by your physician -Make any legal decisions or sign important papers.  Meals: Start with liquid foods such as gelatin or soup. Progress to regular foods as tolerated. Avoid greasy, spicy, heavy foods. If nausea and/or vomiting occur, drink only clear liquids until the nausea and/or vomiting subsides. Call your physician if vomiting continues.  Special Instructions/Symptoms: Your throat may feel dry or sore from the anesthesia or the breathing tube placed in your throat during surgery. If this causes discomfort, gargle with warm salt water. The discomfort should disappear within 24 hours.  No ibuprofen, Advil, Aleve, Motrin, or naproxen until after 4:00 pm today if needed.

## 2020-04-24 NOTE — Op Note (Signed)
Preoperative diagnosis: Recurrent bladder cancer  Postoperative diagnosis: Same  Principal procedure: Cystoscopy, transurethral resection of bladder tumor, 6 to 7 cm in size, placement of postoperative intravesical gemcitabine  Surgeon: Aissata Wilmore  Anesthesia: General with LMA  Specimen: 1.  Bladder tumor 2.  Prostatic urethral biopsy  Drains: 3 French Foley catheter  Estimated blood loss: Less than 20 mL  Complications: None  Indications: 59 year old male initially diagnosed with bladder cancer in mid 2020.  He underwent TURBT in September, 2020 followed a month later by repeat TURBT as he did have high-grade nonmuscle invasive bladder cancer.  He has completed BCG induction as well as 1 maintenance BCG treatment.  Recent cystoscopy revealed probable recurrence at his right bladder neck/trigonal area.  He presents at this time for repeat TURBT, possible gemcitabine administration.  I discussed the procedure with Mays and his wife.  They understand and desire to proceed.  Findings: His prostate was nonobstructive.  There were no urethral lesions but within the proximal prostatic urethra at the 5 o'clock position there was raised/nodular urothelium.  Within the bladder, the dome was clear.  There was a confluent nodular/papillary tumor starting at the bladder neck at approximately the 3 o'clock position and was circumferential in a clockwise direction to about 12:00.  This was strongly suspicious for recurrence.  There was a mid trigonal papillary lesion as well.  The ureteral orifice ease were obscured due to inflammatory process.  Description of procedure: The patient was properly identified in the holding area.  Was taken to the operating room where general anesthetic was administered with the LMA.  He was placed in the dorsolithotomy position.  Genitalia and perineum were prepped and draped.  Proper timeout was performed.  Urethral meatus dilated to 28 [place] with [employer] sounds.  84  French resectoscope sheath was passed using the visual obturator.  The above-mentioned findings were noted.  The visual obturator was replaced with the resectoscope and cutting loop.  Resection was first carried out in the mid trigonal region.  This was a fairly nodular area at the base, and it seemed to be resected down into what I consider the muscular layer.  Upon further inspection, there were no obvious lesions in the anterior, posterior, dome of the bladder, or for that matter either sidewall.  However, the bladder neck/lower trigone area was significantly involved with this process, with most of the tumor seen in the right bladder neck region.  All of the tumor was resected, starting at the 12 o'clock position, working counterclockwise to approximately the 3 o'clock position on the right bladder neck area.  Resection was carried into what I considered to the muscular layer.  Once resected, this tissue was sent for pathology labeled "bladder tumor".  Cautery was applied for hemostasis.  I never did see the ureteral orifice ease during this resection.  The nodular area at the proximal prostatic urethra was resected separately and sent labeled "prostate urethral biopsy".  At this point, there was adequate hemostasis.  All tissue fragments were irrigated from the bladder.  A 20 French Foley catheter was then placed in the balloon filled with 10 cc of water.  The bladder was drained, a catheter plug was placed.  The patient was then awakened and taken to the PACU.  He tolerated the procedure well.  In the PACU, gemcitabine 2 g was instilled.  It was left indwelling for 1 hour and then drained.

## 2020-04-24 NOTE — Anesthesia Preprocedure Evaluation (Addendum)
Anesthesia Evaluation  Patient identified by MRN, date of birth, ID band Patient awake    Reviewed: Allergy & Precautions, NPO status , Patient's Chart, lab work & pertinent test results  Airway Mallampati: I  TM Distance: >3 FB Neck ROM: Full    Dental  (+) Edentulous Upper, Edentulous Lower   Pulmonary former smoker,    Pulmonary exam normal        Cardiovascular hypertension, Pt. on medications  Rhythm:Regular Rate:Normal     Neuro/Psych negative neurological ROS  negative psych ROS   GI/Hepatic Neg liver ROS, GERD  Medicated,  Endo/Other  negative endocrine ROS  Renal/GU negative Renal ROS  negative genitourinary   Musculoskeletal  (+) Arthritis ,   Abdominal Normal abdominal exam  (+)   Peds  Hematology negative hematology ROS (+)   Anesthesia Other Findings   Reproductive/Obstetrics                            Anesthesia Physical Anesthesia Plan  ASA: II  Anesthesia Plan: General   Post-op Pain Management:    Induction: Intravenous  PONV Risk Score and Plan: 3 and Ondansetron, Dexamethasone and Midazolam  Airway Management Planned: LMA  Additional Equipment: None  Intra-op Plan:   Post-operative Plan: Extubation in OR  Informed Consent: I have reviewed the patients History and Physical, chart, labs and discussed the procedure including the risks, benefits and alternatives for the proposed anesthesia with the patient or authorized representative who has indicated his/her understanding and acceptance.     Dental advisory given  Plan Discussed with: CRNA  Anesthesia Plan Comments:        Anesthesia Quick Evaluation

## 2020-04-24 NOTE — Anesthesia Postprocedure Evaluation (Signed)
Anesthesia Post Note  Patient: Barry Gibson  Procedure(s) Performed: TRANSURETHRAL RESECTION OF BLADDER TUMOR WITH GEMCIDABINE (N/A Bladder)     Patient location during evaluation: PACU Anesthesia Type: General Level of consciousness: awake and alert Pain management: pain level controlled Vital Signs Assessment: post-procedure vital signs reviewed and stable Respiratory status: spontaneous breathing, nonlabored ventilation, respiratory function stable and patient connected to nasal cannula oxygen Cardiovascular status: blood pressure returned to baseline and stable Postop Assessment: no apparent nausea or vomiting Anesthetic complications: no    Last Vitals:  Vitals:   04/24/20 1132 04/24/20 1145  BP: 126/89 (!) 140/94  Pulse: 62 64  Resp: 11 12  Temp:    SpO2: 93% 94%    Last Pain:  Vitals:   04/24/20 1145  TempSrc:   PainSc: 0-No pain                 Effie Berkshire

## 2020-04-24 NOTE — Anesthesia Procedure Notes (Signed)
Procedure Name: LMA Insertion Date/Time: 04/24/2020 9:31 AM Performed by: Justice Rocher, CRNA Pre-anesthesia Checklist: Patient identified, Emergency Drugs available, Suction available, Patient being monitored and Timeout performed Patient Re-evaluated:Patient Re-evaluated prior to induction Oxygen Delivery Method: Circle system utilized Preoxygenation: Pre-oxygenation with 100% oxygen Induction Type: IV induction Ventilation: Mask ventilation without difficulty LMA: LMA inserted LMA Size: 5.0 Number of attempts: 1 Airway Equipment and Method: Bite block Placement Confirmation: positive ETCO2,  breath sounds checked- equal and bilateral and CO2 detector Tube secured with: Tape Dental Injury: Teeth and Oropharynx as per pre-operative assessment

## 2020-04-24 NOTE — Transfer of Care (Signed)
Immediate Anesthesia Transfer of Care Note  Patient: Barry Gibson  Procedure(s) Performed: Procedure(s) (LRB): TRANSURETHRAL RESECTION OF BLADDER TUMOR WITH GEMCIDEBINE (N/A)  Patient Location: PACU  Anesthesia Type: General  Level of Consciousness: awake, sedated, patient cooperative and responds to stimulation  Airway & Oxygen Therapy: Patient Spontanous Breathing and Patient connected to [place] 02 and soft FM   Post-op Assessment: Report given to PACU RN, Post -op Vital signs reviewed and stable and Patient moving all extremities  Post vital signs: Reviewed and stable  Complications: No apparent anesthesia complications

## 2020-04-24 NOTE — H&P (Signed)
H&P  Chief Complaint: Bladder cancer  History of Present Illness: 59 year old male presents at this time for repeat resection of recurrent high-grade nonmuscle invasive bladder cancer.  Original resection was in September, 2020.  He received postoperative intravesical chemotherapy.  Repeat/staged resection was performed a month later.  He has completed BCG induction and maintenance but recent cystoscopy revealed recurrence.  He presents at this time for repeat TURBT plus placement of gemcitabine intravesically.  Past Medical History:  Diagnosis Date  . Cancer ([place])    bladder  . Cataract   . COVID-19 11/13/2019   h/a, chills, fatigue, loss of taste and smell, all symptoms resolved in a few weeks  . Frequent urination   . Fuchs' corneal dystrophy   . Full dentures   . GERD (gastroesophageal reflux disease)   . Hypertension   . Rheumatoid arthritis [facility])     Past Surgical History:  Procedure Laterality Date  . CORNEAL TRANSPLANT Left 2013  . CYSTOSCOPY WITH BIOPSY N/A 09/14/2019   Procedure: CYSTOSCOPY WITH BIOPSY;  Surgeon: Franchot Gallo, MD;  Location: [facility];  Service: Urology;  Laterality: N/A;  . FULGURATION OF BLADDER TUMOR N/A 09/14/2019   Procedure: FULGURATION OF BLADDER TUMOR;  Surgeon: Franchot Gallo, MD;  Location: [facility];  Service: Urology;  Laterality: N/A;  . TRANSURETHRAL RESECTION OF BLADDER TUMOR WITH MITOMYCIN-C N/A 08/05/2019   Procedure: TRANSURETHRAL RESECTION OF BLADDER TUMOR;  Surgeon: Franchot Gallo, MD;  Location: [facility];  Service: Urology;  Laterality: N/A;  1 HR    Home Medications:    Allergies:  Allergies  Allergen Reactions  . Ace Inhibitors Cough    History reviewed. No pertinent family history.  Social History:  reports that he has quit smoking. His smoking use included cigarettes. He has a 10.00 pack-year smoking history. He has never used smokeless tobacco. He reports current alcohol use. He reports that he  does not use drugs.  ROS: A complete review of systems was performed.  All systems are negative except for pertinent findings as noted.  Physical Exam:  Vital signs in last 24 hours: BP (!) 129/91   Pulse 72   Temp 97.9 F (36.6 C) (Oral)   Resp 18   Ht 5\' 11"  (1.803 m)   Wt 102.9 kg   SpO2 99%   BMI 31.63 kg/m  Constitutional:  Alert and oriented, No acute distress Cardiovascular: Regular rate  Respiratory: Normal respiratory effort GI: Abdomen is soft, nontender, nondistended, no abdominal masses. No CVAT.  Genitourinary: Normal male phallus, testes are descended bilaterally and non-tender and without masses, scrotum is normal in appearance without lesions or masses, perineum is normal on inspection. Lymphatic: No lymphadenopathy Neurologic: Grossly intact, no focal deficits Psychiatric: Normal mood and affect  Laboratory Data:  No results for input(s): WBC, HGB, HCT, PLT in the last 72 hours.  No results for input(s): NA, K, CL, GLUCOSE, BUN, CALCIUM, CREATININE in the last 72 hours.  Invalid input(s): CO3   No results found for this or any previous visit (from the past 24 hour(s)). Recent Results (from the past 240 hour(s))  SARS CORONAVIRUS 2 (TAT 6-24 HRS) Nasopharyngeal Nasopharyngeal Swab     Status: None   Collection Time: 04/22/20  1:57 PM   Specimen: Nasopharyngeal Swab  Result Value Ref Range Status   SARS Coronavirus 2 NEGATIVE NEGATIVE Final    Comment: (NOTE) SARS-CoV-2 target nucleic acids are NOT DETECTED. The SARS-CoV-2 RNA is generally detectable in upper and lower respiratory specimens during  the acute phase of infection. Negative results do not preclude SARS-CoV-2 infection, do not rule out co-infections with other pathogens, and should not be used as the sole basis for treatment or other patient management decisions. Negative results must be combined with clinical observations, patient history, and epidemiological information. The  expected result is Negative. Fact Sheet for Patients: SugarRoll.be Fact Sheet for Healthcare Providers: https://www.woods-mathews.com/ This test is not yet approved or cleared by the [place] FDA and  has been authorized for detection and/or diagnosis of SARS-CoV-2 by FDA under an Emergency Use Authorization (EUA). This EUA will remain  in effect (meaning this test can be used) for the duration of the COVID-19 declaration under Section 56 4(b)(1) of the Act, 21 U.S.C. section 360bbb-3(b)(1), unless the authorization is terminated or revoked sooner. Performed at [facility], [place] [address] [place], [place] [address]     Renal Function: No results for input(s): CREATININE in the last 168 hours. CrCl cannot be calculated (Patient's most recent lab result is older than the maximum 21 days allowed.).  Radiologic Imaging: No results found.  Impression/Assessment:  Recurrent nonmuscle invasive, high-grade bladder cancer  Plan:  TURBT/gemcitabine

## 2020-04-25 ENCOUNTER — Other Ambulatory Visit: Payer: BC Managed Care – PPO | Admitting: Urology

## 2020-04-25 LAB — SURGICAL PATHOLOGY

## 2020-04-28 ENCOUNTER — Other Ambulatory Visit: Payer: Self-pay | Admitting: Family Medicine

## 2020-05-11 ENCOUNTER — Telehealth: Payer: Self-pay

## 2020-05-11 NOTE — Telephone Encounter (Signed)
Pts wife called saying they were left message that his appt was changed and moved to a date they could not keep because they would be on vacation. Offered pt. Next available and they declined.

## 2020-05-16 ENCOUNTER — Ambulatory Visit: Payer: Self-pay | Admitting: Urology

## 2020-05-16 ENCOUNTER — Other Ambulatory Visit: Payer: Self-pay | Admitting: Urology

## 2020-05-16 DIAGNOSIS — C679 Malignant neoplasm of bladder, unspecified: Secondary | ICD-10-CM

## 2020-05-18 NOTE — Telephone Encounter (Signed)
Entered in error

## 2020-05-19 ENCOUNTER — Ambulatory Visit: Payer: BC Managed Care – PPO

## 2020-05-30 ENCOUNTER — Ambulatory Visit: Payer: Self-pay | Admitting: Urology

## 2020-06-09 ENCOUNTER — Other Ambulatory Visit (HOSPITAL_COMMUNITY)
Admission: AD | Admit: 2020-06-09 | Discharge: 2020-06-09 | Disposition: A | Discharge status: Home / Self Care | Payer: BC Managed Care – PPO | Source: Skilled Nursing Facility | Attending: Urology | Admitting: Urology

## 2020-06-09 ENCOUNTER — Other Ambulatory Visit: Payer: Self-pay

## 2020-06-09 ENCOUNTER — Ambulatory Visit: Payer: BC Managed Care – PPO

## 2020-06-09 ENCOUNTER — Ambulatory Visit (INDEPENDENT_AMBULATORY_CARE_PROVIDER_SITE_OTHER): Payer: BC Managed Care – PPO

## 2020-06-09 DIAGNOSIS — C679 Malignant neoplasm of bladder, unspecified: Secondary | ICD-10-CM | POA: Diagnosis not present

## 2020-06-09 LAB — POCT URINALYSIS DIPSTICK
Bilirubin, UA: NEGATIVE
Glucose, UA: NEGATIVE
Ketones, UA: NEGATIVE
Nitrite, UA: NEGATIVE
Protein, UA: POSITIVE — AB
Spec Grav, UA: <=1.005 — AB (ref 1.010–1.025)
Urobilinogen, UA: 0.2 E.U./dL
pH, UA: 8 (ref 5.0–8.0)

## 2020-06-09 MED ORDER — BCG LIVE 50 MG IS SUSR: 3.2400 mL | Freq: Once | INTRAVESICAL | Status: DC

## 2020-06-09 NOTE — Progress Notes (Signed)
Pt. Unable to start bcg tx today due to grossly bloody urine. Urine sent for culture.

## 2020-06-11 LAB — URINE CULTURE: Culture: NO GROWTH

## 2020-06-12 ENCOUNTER — Telehealth: Payer: Self-pay

## 2020-06-12 NOTE — Telephone Encounter (Signed)
Pt wife called concerning upcoming this Friday for BCG treatment. appt still scheduled. Urine culture result sent to Dr. Diona Fanti. Wife states pt has no symptoms.

## 2020-06-16 ENCOUNTER — Other Ambulatory Visit: Payer: Self-pay

## 2020-06-16 ENCOUNTER — Ambulatory Visit: Payer: BC Managed Care – PPO

## 2020-06-16 ENCOUNTER — Ambulatory Visit (INDEPENDENT_AMBULATORY_CARE_PROVIDER_SITE_OTHER): Payer: BC Managed Care – PPO

## 2020-06-16 DIAGNOSIS — C679 Malignant neoplasm of bladder, unspecified: Secondary | ICD-10-CM

## 2020-06-16 LAB — MICROSCOPIC EXAMINATION
Bacteria, UA: NONE SEEN
Epithelial Cells (non renal): NONE SEEN /hpf (ref 0–10)
RBC, Urine: >30 /hpf — AB (ref 0–2)
Renal Epithel, UA: NONE SEEN /hpf

## 2020-06-16 LAB — URINALYSIS, ROUTINE W REFLEX MICROSCOPIC
Bilirubin, UA: NEGATIVE
Glucose, UA: NEGATIVE
Ketones, UA: NEGATIVE
Nitrite, UA: NEGATIVE
Specific Gravity, UA: 1.02 (ref 1.005–1.030)
Urobilinogen, Ur: 0.2 mg/dL (ref 0.2–1.0)
pH, UA: 6 (ref 5.0–7.5)

## 2020-06-16 MED ORDER — BCG LIVE 50 MG IS SUSR
3.2400 mL | Freq: Once | INTRAVESICAL | Status: AC
  Administered 2020-06-16: 81 mg via INTRAVESICAL

## 2020-06-16 NOTE — Patient Instructions (Signed)

## 2020-06-16 NOTE — Progress Notes (Signed)
BCG Bladder Instillation  BCG # 1 of 6  Due to Bladder Cancer patient is present today for a BCG treatment. Patient was cleaned and prepped in a sterile fashion with betadine. A 14FR catheter was inserted, urine return was noted 28ml, urine was yellow in color.  3ml of reconstituted BCG was instilled into the bladder. The catheter was then removed. Patient tolerated well, no complications were noted  Performed by: Monaca Wadas, LPN  Follow up/ Additional notes: 06/23/20

## 2020-06-21 ENCOUNTER — Telehealth: Payer: Self-pay

## 2020-06-21 NOTE — Telephone Encounter (Signed)
-----   Message from Franchot Gallo, MD sent at 06/20/2020 12:58 PM EDT ----- Let patient know that his urine culture was negative ----- Message ----- From: Dorisann Frames, RN Sent: 06/12/2020   5:00 PM EDT To: Franchot Gallo, MD  Urine culture - was sent last Friday. Pt has no symptoms.

## 2020-06-22 NOTE — Telephone Encounter (Signed)
Patient notified

## 2020-06-23 ENCOUNTER — Ambulatory Visit: Payer: BC Managed Care – PPO

## 2020-06-23 ENCOUNTER — Other Ambulatory Visit: Payer: Self-pay

## 2020-06-23 ENCOUNTER — Ambulatory Visit (INDEPENDENT_AMBULATORY_CARE_PROVIDER_SITE_OTHER): Payer: BC Managed Care – PPO

## 2020-06-23 DIAGNOSIS — C679 Malignant neoplasm of bladder, unspecified: Secondary | ICD-10-CM | POA: Diagnosis not present

## 2020-06-23 LAB — MICROSCOPIC EXAMINATION
Epithelial Cells (non renal): NONE SEEN /hpf (ref 0–10)
Renal Epithel, UA: NONE SEEN /hpf
WBC, UA: >30 /hpf — AB (ref 0–5)

## 2020-06-23 LAB — URINALYSIS, ROUTINE W REFLEX MICROSCOPIC
Bilirubin, UA: NEGATIVE
Glucose, UA: NEGATIVE
Ketones, UA: NEGATIVE
Nitrite, UA: NEGATIVE
Specific Gravity, UA: 1.015 (ref 1.005–1.030)
Urobilinogen, Ur: 0.2 mg/dL (ref 0.2–1.0)
pH, UA: 6.5 (ref 5.0–7.5)

## 2020-06-23 MED ORDER — BCG LIVE 50 MG IS SUSR
3.2400 mL | Freq: Once | INTRAVESICAL | Status: AC
  Administered 2020-06-23: 81 mg via INTRAVESICAL

## 2020-06-23 NOTE — Addendum Note (Signed)
Addended by: Valentina Lucks on: 06/23/2020 04:06 PM   Modules accepted: Orders

## 2020-06-23 NOTE — Progress Notes (Signed)
BCG Bladder Instillation  BCG # 2 of 6   A 32fr red cath was used. Urine was about 10 ccs and cloudy, orangeish in color.  73ml of reconstituted BCG was instilled into the bladder. The catheter was then removed. Patient tolerated well, no complications were noted  Preformed by: Valentina Lucks, LPN

## 2020-06-26 ENCOUNTER — Ambulatory Visit: Payer: Self-pay | Admitting: Family Medicine

## 2020-06-27 LAB — CULTURE, URINE COMPREHENSIVE

## 2020-06-28 ENCOUNTER — Telehealth: Payer: Self-pay

## 2020-06-28 NOTE — Telephone Encounter (Signed)
-----   Message from Irine Seal, MD sent at 06/28/2020  8:02 AM EDT ----- No growth.   Continue with BCG's.  ----- Message ----- From: Dorisann Frames, RN Sent: 06/27/2020   3:08 PM EDT To: Irine Seal, MD  BCG pt culture was ordered on looks like

## 2020-06-28 NOTE — Telephone Encounter (Signed)
Left message of results

## 2020-06-30 ENCOUNTER — Ambulatory Visit (INDEPENDENT_AMBULATORY_CARE_PROVIDER_SITE_OTHER): Payer: BC Managed Care – PPO

## 2020-06-30 ENCOUNTER — Other Ambulatory Visit: Payer: Self-pay

## 2020-06-30 DIAGNOSIS — C679 Malignant neoplasm of bladder, unspecified: Secondary | ICD-10-CM

## 2020-06-30 LAB — URINALYSIS, ROUTINE W REFLEX MICROSCOPIC
Bilirubin, UA: NEGATIVE
Glucose, UA: NEGATIVE
Ketones, UA: NEGATIVE
Nitrite, UA: NEGATIVE
Specific Gravity, UA: 1.02 (ref 1.005–1.030)
Urobilinogen, Ur: 0.2 mg/dL (ref 0.2–1.0)
pH, UA: 7 (ref 5.0–7.5)

## 2020-06-30 LAB — MICROSCOPIC EXAMINATION
Bacteria, UA: NONE SEEN
RBC, Urine: >30 /hpf — AB (ref 0–2)
Renal Epithel, UA: NONE SEEN /hpf
WBC, UA: >30 /hpf — AB (ref 0–5)

## 2020-06-30 MED ORDER — BCG LIVE 50 MG IS SUSR
3.2400 mL | Freq: Once | INTRAVESICAL | Status: AC
  Administered 2020-06-30: 81 mg via INTRAVESICAL

## 2020-06-30 NOTE — Patient Instructions (Signed)

## 2020-06-30 NOTE — Progress Notes (Signed)
BCG Bladder Instillation  BCG # 3  Due to Bladder Cancer patient is present today for a BCG treatment. Patient was cleaned and prepped in a sterile fashion with betadine. A 14FR catheter was inserted, urine return was noted 184ml, urine was yellow in color.  72ml of reconstituted BCG was instilled into the bladder. The catheter was then removed. Patient tolerated well, no complications were noted  Performed by: Gerardo Territo, LPN   Follow up/ Additional notes: keep next scheduled [place].

## 2020-07-03 ENCOUNTER — Other Ambulatory Visit: Payer: Self-pay

## 2020-07-03 ENCOUNTER — Ambulatory Visit (HOSPITAL_COMMUNITY)
Admission: RE | Admit: 2020-07-03 | Discharge: 2020-07-03 | Disposition: A | Discharge status: Home / Self Care | Payer: BC Managed Care – PPO | Source: Ambulatory Visit | Attending: Urology | Admitting: Urology

## 2020-07-03 DIAGNOSIS — C679 Malignant neoplasm of bladder, unspecified: Secondary | ICD-10-CM | POA: Insufficient documentation

## 2020-07-03 LAB — POCT I-STAT CREATININE: Creatinine, Ser: 1.8 mg/dL — ABNORMAL HIGH (ref 0.61–1.24)

## 2020-07-03 MED ORDER — IOHEXOL 300 MG/ML  SOLN
100.0000 mL | Freq: Once | INTRAMUSCULAR | Status: AC | PRN
  Administered 2020-07-03: 75 mL via INTRAVENOUS

## 2020-07-07 ENCOUNTER — Ambulatory Visit (INDEPENDENT_AMBULATORY_CARE_PROVIDER_SITE_OTHER): Payer: BC Managed Care – PPO

## 2020-07-07 ENCOUNTER — Other Ambulatory Visit: Payer: Self-pay

## 2020-07-07 ENCOUNTER — Telehealth: Payer: Self-pay

## 2020-07-07 DIAGNOSIS — C679 Malignant neoplasm of bladder, unspecified: Secondary | ICD-10-CM | POA: Diagnosis not present

## 2020-07-07 LAB — URINALYSIS, ROUTINE W REFLEX MICROSCOPIC
Bilirubin, UA: NEGATIVE
Glucose, UA: NEGATIVE
Ketones, UA: NEGATIVE
Nitrite, UA: NEGATIVE
Specific Gravity, UA: 1.02 (ref 1.005–1.030)
Urobilinogen, Ur: 0.2 mg/dL (ref 0.2–1.0)
pH, UA: 6.5 (ref 5.0–7.5)

## 2020-07-07 LAB — MICROSCOPIC EXAMINATION
Bacteria, UA: NONE SEEN
Epithelial Cells (non renal): NONE SEEN /hpf (ref 0–10)
RBC, Urine: >30 /hpf — AB (ref 0–2)
Renal Epithel, UA: NONE SEEN /hpf

## 2020-07-07 MED ORDER — BCG LIVE 50 MG IS SUSR
3.2400 mL | Freq: Once | INTRAVESICAL | Status: AC
  Administered 2020-07-07: 81 mg via INTRAVESICAL

## 2020-07-07 NOTE — Telephone Encounter (Signed)
Wife called and notified of results. Pt scheduled for BCG today and cysto f/u in September.

## 2020-07-07 NOTE — Telephone Encounter (Signed)
-----   Message from Franchot Gallo, MD sent at 07/06/2020  3:24 PM EDT ----- Please clall pt--I looked @ his CT. Bladder wall thickened, most likely d/y several TURBTs as well as BCG treatments.  The bladder is mostly empty, so it will appear thicker most of the time when empty.  To me, no obvious tumor recurrence.  The ureters are slightly enlarged near where they enter the bladder, this is most likely due to bladder malfunction and his prior procedures.  There are no enlarged lymph nodes or evidence of anything outside of his bladder. ----- Message ----- From: Dorisann Frames, RN Sent: 07/04/2020  10:18 AM EDT To: Franchot Gallo, MD  Please review

## 2020-07-07 NOTE — Progress Notes (Signed)
BCG Bladder Instillation  BCG # 4  Due to Bladder Cancer patient is present today for a BCG treatment. Patient was cleaned and prepped in a sterile fashion with betadine. A 14FR catheter was inserted, urine return was noted 19ml, urine was amber in color.  66ml of reconstituted BCG was instilled into the bladder. The catheter was then removed. Patient tolerated well, no complications were noted  Performed by: Marcelo Ickes,LPN  Follow up/ Additional notes: Keep next scheduled BCG

## 2020-07-14 ENCOUNTER — Ambulatory Visit (INDEPENDENT_AMBULATORY_CARE_PROVIDER_SITE_OTHER): Payer: BC Managed Care – PPO

## 2020-07-14 ENCOUNTER — Other Ambulatory Visit: Payer: Self-pay

## 2020-07-14 DIAGNOSIS — C679 Malignant neoplasm of bladder, unspecified: Secondary | ICD-10-CM | POA: Diagnosis not present

## 2020-07-14 LAB — URINALYSIS, ROUTINE W REFLEX MICROSCOPIC
Bilirubin, UA: NEGATIVE
Glucose, UA: NEGATIVE
Ketones, UA: NEGATIVE
Nitrite, UA: NEGATIVE
Specific Gravity, UA: 1.02 (ref 1.005–1.030)
Urobilinogen, Ur: 0.2 mg/dL (ref 0.2–1.0)
pH, UA: 6.5 (ref 5.0–7.5)

## 2020-07-14 LAB — MICROSCOPIC EXAMINATION
Bacteria, UA: NONE SEEN
Epithelial Cells (non renal): NONE SEEN /hpf (ref 0–10)
RBC, Urine: >30 /hpf — AB (ref 0–2)

## 2020-07-14 MED ORDER — BCG LIVE 50 MG IS SUSR
3.2400 mL | Freq: Once | INTRAVESICAL | Status: AC
  Administered 2020-07-14: 81 mg via INTRAVESICAL

## 2020-07-14 NOTE — Progress Notes (Signed)
  BCG Bladder Instillation  BCG # 5 of 6  Due to Bladder Cancer patient is present today for a BCG treatment. Patient was cleaned and prepped in a sterile fashion with betadine. A 14FR catheter was inserted, urine return was noted 55ml, urine was yellow in color.  18ml of reconstituted BCG was instilled into the bladder. The catheter was then removed. Patient tolerated well, no complications were noted  Preformed by: Valentina Lucks LPN

## 2020-07-21 ENCOUNTER — Ambulatory Visit: Payer: BC Managed Care – PPO

## 2020-07-25 ENCOUNTER — Other Ambulatory Visit: Payer: BC Managed Care – PPO | Admitting: Urology

## 2020-07-28 ENCOUNTER — Ambulatory Visit: Payer: BC Managed Care – PPO

## 2020-08-01 ENCOUNTER — Other Ambulatory Visit: Payer: BC Managed Care – PPO | Admitting: Urology

## 2020-08-03 ENCOUNTER — Telehealth: Payer: Self-pay

## 2020-08-03 NOTE — Telephone Encounter (Signed)
For now that is fine

## 2020-08-04 NOTE — Telephone Encounter (Signed)
Wife notified of okay to restart.

## 2020-08-15 ENCOUNTER — Other Ambulatory Visit: Payer: Self-pay

## 2020-08-15 ENCOUNTER — Ambulatory Visit (INDEPENDENT_AMBULATORY_CARE_PROVIDER_SITE_OTHER): Payer: BC Managed Care – PPO | Admitting: Urology

## 2020-08-15 ENCOUNTER — Encounter: Payer: Self-pay | Admitting: Urology

## 2020-08-15 VITALS — BP 135/89 | HR 85 | Temp 97.7°F | Ht 71.0 in | Wt 227.0 lb

## 2020-08-15 DIAGNOSIS — C679 Malignant neoplasm of bladder, unspecified: Secondary | ICD-10-CM

## 2020-08-15 LAB — URINALYSIS, ROUTINE W REFLEX MICROSCOPIC
Bilirubin, UA: NEGATIVE
Glucose, UA: NEGATIVE
Ketones, UA: NEGATIVE
Nitrite, UA: NEGATIVE
Specific Gravity, UA: 1.02 (ref 1.005–1.030)
Urobilinogen, Ur: 0.2 mg/dL (ref 0.2–1.0)
pH, UA: 6 (ref 5.0–7.5)

## 2020-08-15 LAB — MICROSCOPIC EXAMINATION
Bacteria, UA: NONE SEEN
Epithelial Cells (non renal): NONE SEEN /hpf (ref 0–10)
RBC, Urine: >30 /hpf — AB (ref 0–2)
Renal Epithel, UA: NONE SEEN /hpf

## 2020-08-15 MED ORDER — CIPROFLOXACIN HCL 500 MG PO TABS
500.0000 mg | ORAL_TABLET | Freq: Once | ORAL | Status: AC
  Administered 2020-08-15: 500 mg via ORAL

## 2020-08-15 NOTE — Progress Notes (Signed)
Urological Symptom Review  Patient is experiencing the following symptoms: Frequent urination Hard to postpone urination Get up at night to urinate Weak stream  Review of Systems  Gastrointestinal (upper)  : Negative for upper GI symptoms  Gastrointestinal (lower) : Negative for lower GI symptoms  Constitutional : Negative for symptoms  Skin: Negative for skin symptoms  Eyes: Negative for eye symptoms  Ear/Nose/Throat : Negative for Ear/Nose/Throat symptoms  Hematologic/Lymphatic: Negative for Hematologic/Lymphatic symptoms  Cardiovascular : Negative for cardiovascular symptoms  Respiratory : Negative for respiratory symptoms  Endocrine: Negative for endocrine symptoms  Musculoskeletal: Negative for musculoskeletal symptoms  Neurological: Negative for neurological symptoms  Psychologic: Depression Negative for psychiatric symptoms

## 2020-08-15 NOTE — Addendum Note (Signed)
Addended by: Valentina Lucks on: 08/15/2020 04:59 PM   Modules accepted: Orders

## 2020-08-15 NOTE — Addendum Note (Signed)
Addended by: Pollyann Kennedy F on: 08/15/2020 04:47 PM   Modules accepted: Orders

## 2020-08-15 NOTE — Progress Notes (Signed)
H&P  Chief Complaint: Bladder Cancer  History of Present Illness:  9.28.2021: Barry Gibson is here today for follow-up of urothelial carcinoma the bladder.  He has high-grade nonmuscle invasive bladder cancer.   (below copied from [facility] records):  9.17.2020: TURBT. Multifocal lesions seen, most on left bladder wall. Small perforation on left d/t obturator reflex. Gemzar not placed. He experienced postop retention following cath removal necessitating repeat catheter placement. Path--HG NMIBC (St T1), muscularis present.   10.27.2020: He underwent 2nd TURBT. 2 lesions resected-left anterior bladder neck and left bladder wall. Pathology revealed persistent high-grade non muscle invasive bladder cancer. Gemcitabine placed following procedure.   12.15.2020: Completed 6 wks of BCG induction  1.12.2021: Cystoscopy revealed no overt recurrence.  Cytology revealed atypia.  3.23.2021: Completed second of 3 BCG maintenance treatments.  He could not tolerate a third.  5.11.2021: He presents for cystoscopy.  He does state that he did not tolerate BCG as it caused significant fatigue.  He is having intermittent gross painless hematuria, especially when he does heavy activities.  He has urinary frequency urgency and slow stream despite being on 2 tamsulosin a day.  6.7.2021: Cystoscopy, transurethral resection of bladder tumor, 6 to 7 cm in size, placement of postoperative intravesical gemcitabine. Path-- A. BLADDER, TURBT:  Invasive high grade urothelial carcinoma  The carcinoma invades laminar propria  Muscularis propria (detrusor muscle) is present and not involved   B. PROSTATIC URETHRA, BIOPSY:  Scant benign urothelium  Submucosa with chronic inflammation and BCG granulomas  8.27.2021: Completed 5/6 planned BCG treatments.  6th treatment not given due to being on national back order.  He tolerated his treatment well.  He has not had significant complaints of hematuria.  Still having frequency and  urgency.  Past Medical History:  Diagnosis Date  . Cancer ([place])    bladder  . Cataract   . COVID-19 11/13/2019   h/a, chills, fatigue, loss of taste and smell, all symptoms resolved in a few weeks  . Frequent urination   . Fuchs' corneal dystrophy   . Full dentures   . GERD (gastroesophageal reflux disease)   . Hypertension   . Rheumatoid arthritis [facility])     Past Surgical History:  Procedure Laterality Date  . CORNEAL TRANSPLANT Left 2013  . CYSTOSCOPY WITH BIOPSY N/A 09/14/2019   Procedure: CYSTOSCOPY WITH BIOPSY;  Surgeon: Franchot Gallo, MD;  Location: [facility];  Service: Urology;  Laterality: N/A;  . FULGURATION OF BLADDER TUMOR N/A 09/14/2019   Procedure: FULGURATION OF BLADDER TUMOR;  Surgeon: Franchot Gallo, MD;  Location: [facility];  Service: Urology;  Laterality: N/A;  . TRANSURETHRAL RESECTION OF BLADDER TUMOR WITH MITOMYCIN-C N/A 08/05/2019   Procedure: TRANSURETHRAL RESECTION OF BLADDER TUMOR;  Surgeon: Franchot Gallo, MD;  Location: [facility];  Service: Urology;  Laterality: N/A;  1 HR  . TRANSURETHRAL RESECTION OF BLADDER TUMOR WITH MITOMYCIN-C N/A 04/24/2020   Procedure: TRANSURETHRAL RESECTION OF BLADDER TUMOR WITH GEMCIDABINE;  Surgeon: Franchot Gallo, MD;  Location: [facility];  Service: Urology;  Laterality: N/A;    Home Medications:  Allergies as of 08/15/2020      Reactions   Ace Inhibitors Cough      Medication List       Accurate as of August 15, 2020  2:13 PM. If you have any questions, ask your nurse or doctor.        acetaminophen 650 MG CR tablet Commonly known as: TYLENOL Take 650 mg by mouth every 8 (eight) hours  as needed for pain.   chlorthalidone 25 MG tablet Commonly known as: HYGROTON Take 0.5 tablets (12.5 mg total) by mouth daily.   Humira Pen 40 MG/0.4ML Pnkt Generic drug: Adalimumab Inject 40 mg into the skin every 14 (fourteen) days.   leflunomide 20 MG tablet Commonly known as:  ARAVA Take 20 mg by mouth daily.   losartan 100 MG tablet Commonly known as: COZAAR Take 1 tablet (100 mg total) by mouth daily. What changed: when to take this   omeprazole 40 MG capsule Commonly known as: PRILOSEC Take 40 mg by mouth daily.   pravastatin 40 MG tablet Commonly known as: PRAVACHOL Take 1 tablet (40 mg total) by mouth daily. What changed: when to take this   prednisoLONE acetate 1 % ophthalmic suspension Commonly known as: PRED FORTE Place 1 drop into both eyes 2 (two) times a week.   sulfamethoxazole-trimethoprim 800-160 MG tablet Commonly known as: BACTRIM DS Take 1 tablet by mouth 2 (two) times daily.   tamsulosin 0.4 MG Caps capsule Commonly known as: FLOMAX Take 2 capsules (0.8 mg total) by mouth daily. What changed: when to take this   traMADol 50 MG tablet Commonly known as: Ultram Take 1 tablet (50 mg total) by mouth every 6 (six) hours as needed.       Allergies:  Allergies  Allergen Reactions  . Ace Inhibitors Cough    No family history on file.  Social History:  reports that he has quit smoking. His smoking use included cigarettes. He has a 10.00 pack-year smoking history. He has never used smokeless tobacco. He reports current alcohol use. He reports that he does not use drugs.  ROS: A complete review of systems was performed.  All systems are negative except for pertinent findings as noted.  Physical Exam:  Vital signs in last 24 hours: There were no vitals taken for this visit. Constitutional:  Alert and oriented, No acute distress Cardiovascular: Regular rate  Respiratory: Normal respiratory effort GI: Abdomen is soft, nontender, nondistended, no abdominal masses. No CVAT.  Genitourinary: Normal male phallus, testes are descended bilaterally and non-tender and without masses, scrotum is normal in appearance without lesions or masses, perineum is normal on inspection. Lymphatic: No lymphadenopathy Neurologic: Grossly intact, no  focal deficits Psychiatric: Normal mood and affect  I have reviewed prior pt notes  I have reviewed urinalysis results  I have independently reviewed prior imaging  Cystoscopy Procedure Note:  Indication: Surveillance of high-grade nonmuscle invasive bladder cancer  After informed consent and discussion of the procedure and its risks, Barry Gibson was positioned and prepped in the standard fashion.  Cystoscopy was performed with a flexible cystoscope.   Findings: Urethra: No stricture or lesions Prostate: Moderately obstructing Bladder neck: Erythema circumferentially, no discrete tumors seen Ureteral orifices: Normal location Bladder: Multiple areas of erythema consistent with prior resection sites.  No discrete papillary or nodular lesion seen.  Washings taken for cytology  The patient tolerated the procedure well.      Impression/Assessment:  History nonmuscle invasive high-grade bladder cancer, with recurrence following BCG induction.  He completed second induction therapy recently.  I do not see any obvious tumor but there is obvious post resection appearance as well as changes from recent BCG  Plan:  Cytologies were sent  I will set up maintenance BCG for about 6 weeks, but if cytology is positive he will more than likely need more definitive therapy.  He is currently on oxybutynin twice a day.  I  gave him the names of tolterodine and solifenacin to check if they are on his formulary.  If so, he can either take 10 mg of solifenacin daily or 4 mg of tolterodine daily.  He will call us once he finds out.

## 2020-08-16 ENCOUNTER — Telehealth: Payer: Self-pay | Admitting: Urology

## 2020-08-16 LAB — CYTOLOGY, URINE

## 2020-08-16 NOTE — Telephone Encounter (Signed)
Patients wife called stating at the visit yesterday Dr Diona Fanti gave them the name of two medications to check with insurance and see if it would be covered. She said either medication would be covered. She asked if he would send in the prescription for 90 days.

## 2020-08-17 ENCOUNTER — Other Ambulatory Visit: Payer: Self-pay

## 2020-08-17 MED ORDER — SOLIFENACIN SUCCINATE 10 MG PO TABS: 10.0000 mg | ORAL_TABLET | Freq: Every day | ORAL | 3 refills | Status: DC

## 2020-08-17 NOTE — Telephone Encounter (Signed)
Medication sent to pharmacy per wife request of 90 day rx as per Dr. Diona Fanti. Vesicare 10mg  daily

## 2020-08-21 ENCOUNTER — Telehealth: Payer: Self-pay

## 2020-08-22 NOTE — Telephone Encounter (Signed)
Her know that cytology revealed atypical cells which I expected. No evidence of malignancy.

## 2020-08-22 NOTE — Telephone Encounter (Signed)
Notified pt and wife by leaving message on home phone of which permission was given to do so.

## 2020-09-05 ENCOUNTER — Other Ambulatory Visit: Payer: BC Managed Care – PPO | Admitting: Urology

## 2020-09-14 ENCOUNTER — Other Ambulatory Visit: Payer: Self-pay

## 2020-09-22 ENCOUNTER — Other Ambulatory Visit: Payer: Self-pay

## 2020-09-22 ENCOUNTER — Ambulatory Visit: Payer: BC Managed Care – PPO

## 2020-09-22 ENCOUNTER — Telehealth: Payer: Self-pay

## 2020-09-22 DIAGNOSIS — C679 Malignant neoplasm of bladder, unspecified: Secondary | ICD-10-CM

## 2020-09-22 LAB — URINALYSIS, ROUTINE W REFLEX MICROSCOPIC
Bilirubin, UA: NEGATIVE
Glucose, UA: NEGATIVE
Ketones, UA: NEGATIVE
Nitrite, UA: NEGATIVE
Specific Gravity, UA: 1.015 (ref 1.005–1.030)
Urobilinogen, Ur: 0.2 mg/dL (ref 0.2–1.0)
pH, UA: 5.5 (ref 5.0–7.5)

## 2020-09-22 LAB — MICROSCOPIC EXAMINATION
Epithelial Cells (non renal): NONE SEEN /hpf (ref 0–10)
RBC, Urine: >30 /hpf — AB (ref 0–2)
Renal Epithel, UA: NONE SEEN /hpf

## 2020-09-22 MED ORDER — FLUCONAZOLE 100 MG PO TABS: 100.0000 mg | ORAL_TABLET | Freq: Every day | ORAL | 0 refills | Status: DC

## 2020-09-22 NOTE — Addendum Note (Signed)
Addended by: Dorisann Frames on: 09/22/2020 03:24 PM   Modules accepted: Orders

## 2020-09-22 NOTE — Progress Notes (Signed)
Pt came today for BCG. Pt urine reviewed with Dr. Alyson Ingles and questionable if he had a  UTI. Urine sent for culture. Diflucan sent to pharmacy as well per Dr. Alyson Ingles.

## 2020-09-22 NOTE — Telephone Encounter (Signed)
Last labs 03/2018: Met 7, psa, lipid and liver.

## 2020-09-22 NOTE — Addendum Note (Signed)
Addended by: Pollyann Kennedy F on: 09/22/2020 03:59 PM   Modules accepted: Orders

## 2020-09-22 NOTE — Telephone Encounter (Signed)
Patient has six month follow

## 2020-09-23 ENCOUNTER — Inpatient Hospital Stay (HOSPITAL_COMMUNITY)
Admission: EM | Admit: 2020-09-23 | Discharge: 2020-09-26 | DRG: 641 | Disposition: A | Discharge status: Home / Self Care | Payer: BC Managed Care – PPO | Attending: Family Medicine | Admitting: Family Medicine

## 2020-09-23 ENCOUNTER — Other Ambulatory Visit: Payer: Self-pay

## 2020-09-23 ENCOUNTER — Emergency Department (HOSPITAL_COMMUNITY): Payer: BC Managed Care – PPO

## 2020-09-23 ENCOUNTER — Encounter (HOSPITAL_COMMUNITY): Payer: Self-pay | Admitting: *Deleted

## 2020-09-23 DIAGNOSIS — M069 Rheumatoid arthritis, unspecified: Secondary | ICD-10-CM | POA: Diagnosis present

## 2020-09-23 DIAGNOSIS — Z5329 Procedure and treatment not carried out because of patient's decision for other reasons: Secondary | ICD-10-CM | POA: Diagnosis present

## 2020-09-23 DIAGNOSIS — B3749 Other urogenital candidiasis: Secondary | ICD-10-CM | POA: Diagnosis present

## 2020-09-23 DIAGNOSIS — T465X5A Adverse effect of other antihypertensive drugs, initial encounter: Secondary | ICD-10-CM | POA: Diagnosis present

## 2020-09-23 DIAGNOSIS — E785 Hyperlipidemia, unspecified: Secondary | ICD-10-CM | POA: Diagnosis present

## 2020-09-23 DIAGNOSIS — T502X5A Adverse effect of carbonic-anhydrase inhibitors, benzothiadiazides and other diuretics, initial encounter: Secondary | ICD-10-CM | POA: Diagnosis present

## 2020-09-23 DIAGNOSIS — C679 Malignant neoplasm of bladder, unspecified: Secondary | ICD-10-CM | POA: Diagnosis present

## 2020-09-23 DIAGNOSIS — I10 Essential (primary) hypertension: Secondary | ICD-10-CM | POA: Diagnosis present

## 2020-09-23 DIAGNOSIS — D630 Anemia in neoplastic disease: Secondary | ICD-10-CM | POA: Diagnosis present

## 2020-09-23 DIAGNOSIS — Z87891 Personal history of nicotine dependence: Secondary | ICD-10-CM

## 2020-09-23 DIAGNOSIS — E782 Mixed hyperlipidemia: Secondary | ICD-10-CM | POA: Diagnosis not present

## 2020-09-23 DIAGNOSIS — N400 Enlarged prostate without lower urinary tract symptoms: Secondary | ICD-10-CM | POA: Diagnosis present

## 2020-09-23 DIAGNOSIS — Z947 Corneal transplant status: Secondary | ICD-10-CM

## 2020-09-23 DIAGNOSIS — N1330 Unspecified hydronephrosis: Secondary | ICD-10-CM | POA: Diagnosis present

## 2020-09-23 DIAGNOSIS — K219 Gastro-esophageal reflux disease without esophagitis: Secondary | ICD-10-CM | POA: Diagnosis present

## 2020-09-23 DIAGNOSIS — Z888 Allergy status to other drugs, medicaments and biological substances status: Secondary | ICD-10-CM

## 2020-09-23 DIAGNOSIS — Z20822 Contact with and (suspected) exposure to covid-19: Secondary | ICD-10-CM | POA: Diagnosis present

## 2020-09-23 DIAGNOSIS — R1010 Upper abdominal pain, unspecified: Secondary | ICD-10-CM

## 2020-09-23 DIAGNOSIS — N390 Urinary tract infection, site not specified: Secondary | ICD-10-CM

## 2020-09-23 DIAGNOSIS — N179 Acute kidney failure, unspecified: Secondary | ICD-10-CM | POA: Diagnosis present

## 2020-09-23 DIAGNOSIS — Z8616 Personal history of COVID-19: Secondary | ICD-10-CM | POA: Diagnosis not present

## 2020-09-23 DIAGNOSIS — R109 Unspecified abdominal pain: Secondary | ICD-10-CM

## 2020-09-23 DIAGNOSIS — Z79899 Other long term (current) drug therapy: Secondary | ICD-10-CM

## 2020-09-23 DIAGNOSIS — Z8711 Personal history of peptic ulcer disease: Secondary | ICD-10-CM | POA: Diagnosis not present

## 2020-09-23 DIAGNOSIS — E860 Dehydration: Secondary | ICD-10-CM | POA: Diagnosis not present

## 2020-09-23 DIAGNOSIS — N139 Obstructive and reflux uropathy, unspecified: Secondary | ICD-10-CM | POA: Diagnosis present

## 2020-09-23 DIAGNOSIS — Y929 Unspecified place or not applicable: Secondary | ICD-10-CM

## 2020-09-23 DIAGNOSIS — C678 Malignant neoplasm of overlapping sites of bladder: Secondary | ICD-10-CM | POA: Diagnosis not present

## 2020-09-23 LAB — CBC WITH DIFFERENTIAL/PLATELET
Abs Immature Granulocytes: 0.04 10*3/uL (ref 0.00–0.07)
Basophils Absolute: 0.1 10*3/uL (ref 0.0–0.1)
Basophils Relative: 1 %
Eosinophils Absolute: 0.1 10*3/uL (ref 0.0–0.5)
Eosinophils Relative: 1 %
HCT: 41.1 % (ref 39.0–52.0)
Hemoglobin: 13.2 g/dL (ref 13.0–17.0)
Immature Granulocytes: 0 %
Lymphocytes Relative: 14 %
Lymphs Abs: 1.3 10*3/uL (ref 0.7–4.0)
MCH: 30.3 pg (ref 26.0–34.0)
MCHC: 32.1 g/dL (ref 30.0–36.0)
MCV: 94.3 fL (ref 80.0–100.0)
Monocytes Absolute: 1 10*3/uL (ref 0.1–1.0)
Monocytes Relative: 11 %
Neutro Abs: 6.7 10*3/uL (ref 1.7–7.7)
Neutrophils Relative %: 73 %
Platelets: 218 10*3/uL (ref 150–400)
RBC: 4.36 MIL/uL (ref 4.22–5.81)
RDW: 14.5 % (ref 11.5–15.5)
WBC: 9.2 10*3/uL (ref 4.0–10.5)
nRBC: 0 % (ref 0.0–0.2)

## 2020-09-23 LAB — COMPREHENSIVE METABOLIC PANEL
ALT: 31 U/L (ref 0–44)
AST: 24 U/L (ref 15–41)
Albumin: 3.8 g/dL (ref 3.5–5.0)
Alkaline Phosphatase: 46 U/L (ref 38–126)
Anion gap: 9 (ref 5–15)
BUN: 41 mg/dL — ABNORMAL HIGH (ref 6–20)
CO2: 26 mmol/L (ref 22–32)
Calcium: 8.3 mg/dL — ABNORMAL LOW (ref 8.9–10.3)
Chloride: 105 mmol/L (ref 98–111)
Creatinine, Ser: 3.03 mg/dL — ABNORMAL HIGH (ref 0.61–1.24)
GFR, Estimated: 23 mL/min — ABNORMAL LOW (ref >60)
Glucose, Bld: 116 mg/dL — ABNORMAL HIGH (ref 70–99)
Potassium: 3.5 mmol/L (ref 3.5–5.1)
Sodium: 140 mmol/L (ref 135–145)
Total Bilirubin: 0.6 mg/dL (ref 0.3–1.2)
Total Protein: 6.9 g/dL (ref 6.5–8.1)

## 2020-09-23 LAB — LIPASE, BLOOD: Lipase: 31 U/L (ref 11–51)

## 2020-09-23 MED ORDER — ONDANSETRON HCL 4 MG/2ML IJ SOLN
4.0000 mg | Freq: Once | INTRAMUSCULAR | Status: AC
  Administered 2020-09-23: 4 mg via INTRAVENOUS
  Filled 2020-09-23: qty 2

## 2020-09-23 MED ORDER — SODIUM CHLORIDE 0.9 % IV BOLUS
1000.0000 mL | Freq: Once | INTRAVENOUS | Status: AC
  Administered 2020-09-23: 1000 mL via INTRAVENOUS

## 2020-09-23 MED ORDER — HYDROMORPHONE HCL 1 MG/ML IJ SOLN
0.5000 mg | Freq: Once | INTRAMUSCULAR | Status: AC
  Administered 2020-09-23: 0.5 mg via INTRAVENOUS
  Filled 2020-09-23: qty 1

## 2020-09-23 MED ORDER — PANTOPRAZOLE SODIUM 40 MG IV SOLR
40.0000 mg | Freq: Once | INTRAVENOUS | Status: AC
  Administered 2020-09-23: 40 mg via INTRAVENOUS
  Filled 2020-09-23: qty 40

## 2020-09-23 NOTE — ED Triage Notes (Signed)
Abdominal pain onset 1 hour ago. Pain in right arm

## 2020-09-23 NOTE — ED Provider Notes (Signed)
[facility] EMERGENCY DEPARTMENT Provider Note   CSN: [id number] Arrival date & time: 09/23/20  1743     History Chief Complaint  Patient presents with  . Abdominal Pain    Barry Gibson is a 59 y.o. male.  Patient had some epigastric abdominal pain off and on today.  Patient has a history of ulcers.  He also has bladder cancer.  No fever no chills  The history is provided by the patient and medical records. No language interpreter was used.  Abdominal Pain Pain location:  Generalized Pain quality: aching   Pain radiates to:  Does not radiate Pain severity:  Mild Onset quality:  Sudden Timing:  Intermittent Progression:  Waxing and waning Chronicity:  New Context: not alcohol use   Relieved by:  Nothing Associated symptoms: no chest pain, no cough, no diarrhea, no fatigue and no hematuria        Past Medical History:  Diagnosis Date  . Cancer ([place])    bladder  . Cataract   . COVID-19 11/13/2019   h/a, chills, fatigue, loss of taste and smell, all symptoms resolved in a few weeks  . Frequent urination   . Fuchs' corneal dystrophy   . Full dentures   . GERD (gastroesophageal reflux disease)   . Hypertension   . Rheumatoid arthritis [facility])     Patient Active Problem List   Diagnosis Date Noted  . Bladder cancer ([place]) 03/06/2020  . Rheumatoid arthritis ([place]) 04/07/2018  . Prostate hypertrophy 07/15/2017  . Elevated fasting blood sugar 11/05/2016  . Essential hypertension, benign 02/05/2013  . Hyperlipidemia 02/04/2013    Past Surgical History:  Procedure Laterality Date  . CORNEAL TRANSPLANT Left 2013  . CYSTOSCOPY WITH BIOPSY N/A 09/14/2019   Procedure: CYSTOSCOPY WITH BIOPSY;  Surgeon: Franchot Gallo, MD;  Location: [facility];  Service: Urology;  Laterality: N/A;  . FULGURATION OF BLADDER TUMOR N/A 09/14/2019   Procedure: FULGURATION OF BLADDER TUMOR;  Surgeon: Franchot Gallo, MD;  Location: [facility];  Service: Urology;  Laterality: N/A;  .  TRANSURETHRAL RESECTION OF BLADDER TUMOR WITH MITOMYCIN-C N/A 08/05/2019   Procedure: TRANSURETHRAL RESECTION OF BLADDER TUMOR;  Surgeon: Franchot Gallo, MD;  Location: [facility];  Service: Urology;  Laterality: N/A;  1 HR  . TRANSURETHRAL RESECTION OF BLADDER TUMOR WITH MITOMYCIN-C N/A 04/24/2020   Procedure: TRANSURETHRAL RESECTION OF BLADDER TUMOR WITH GEMCIDABINE;  Surgeon: Franchot Gallo, MD;  Location: [facility];  Service: Urology;  Laterality: N/A;       No family history on file.  Social History   Tobacco Use  . Smoking status: Former Smoker    Packs/day: 0.50    Years: 20.00    Pack years: 10.00    Types: Cigarettes  . Smokeless tobacco: Never Used  . Tobacco comment: quit 2010  Vaping Use  . Vaping Use: Never used  Substance Use Topics  . Alcohol use: Yes    Comment: occ  2 x month  . Drug use: No    Home Medications Prior to Admission medications   Medication Sig Start Date End Date Taking? Authorizing Provider  acetaminophen (TYLENOL) 650 MG CR tablet Take 650 mg by mouth every 8 (eight) hours as needed for pain.   Yes [provider]  chlorthalidone (HYGROTON) 25 MG tablet Take 0.5 tablets (12.5 mg total) by mouth daily. 04/04/20  Yes Mikey Kirschner, MD  fluconazole (DIFLUCAN) 100 MG tablet Take 1 tablet (100 mg total) by mouth daily. X  7 days 09/22/20  Yes McKenzie, Candee Furbish, MD  HUMIRA PEN 40 MG/0.4ML PNKT Inject 40 mg into the skin every 14 (fourteen) days. 07/26/19  Yes [provider]  leflunomide (ARAVA) 20 MG tablet Take 20 mg by mouth daily. 07/27/19  Yes [provider]  losartan (COZAAR) 100 MG tablet Take 1 tablet (100 mg total) by mouth daily. Patient taking differently: Take 100 mg by mouth at bedtime.  04/04/20  Yes Mikey Kirschner, MD  omeprazole (PRILOSEC) 40 MG capsule Take 40 mg by mouth daily.   Yes [provider]  pravastatin (PRAVACHOL) 40 MG tablet Take 1 tablet (40 mg  total) by mouth daily. Patient taking differently: Take 80 mg by mouth daily.  04/04/20  Yes Mikey Kirschner, MD  solifenacin (VESICARE) 10 MG tablet Take 1 tablet (10 mg total) by mouth daily. 08/17/20  Yes Dahlstedt, Annie Main, MD  tamsulosin (FLOMAX) 0.4 MG CAPS capsule Take 2 capsules (0.8 mg total) by mouth daily. Patient taking differently: Take 0.8 mg by mouth at bedtime.  04/04/20  Yes Mikey Kirschner, MD  prednisoLONE acetate (PRED FORTE) 1 % ophthalmic suspension Place 1 drop into both eyes 2 (two) times a week. Instill 1 drop into both eyes Mondays and Friday.    [provider]  sulfamethoxazole-trimethoprim (BACTRIM DS) 800-160 MG tablet Take 1 tablet by mouth 2 (two) times daily. Patient not taking: Reported on 09/23/2020 04/24/20   Franchot Gallo, MD  traMADol (ULTRAM) 50 MG tablet Take 1 tablet (50 mg total) by mouth every 6 (six) hours as needed. Patient not taking: Reported on 09/23/2020 04/24/20 04/24/21  Franchot Gallo, MD    Allergies    Ace inhibitors  Review of Systems   Review of Systems  Constitutional: Negative for appetite change and fatigue.  HENT: Negative for congestion, ear discharge and sinus pressure.   Eyes: Negative for discharge.  Respiratory: Negative for cough.   Cardiovascular: Negative for chest pain.  Gastrointestinal: Positive for abdominal pain. Negative for diarrhea.  Genitourinary: Negative for frequency and hematuria.  Musculoskeletal: Negative for back pain.  Skin: Negative for rash.  Neurological: Negative for seizures and headaches.  Psychiatric/Behavioral: Negative for hallucinations.    Physical Exam Updated Vital Signs BP 136/81 (BP Location: Left Arm)   Pulse 60   Temp 97.7 F (36.5 C)   Resp 14   Ht 5\' 11"  (1.803 m)   Wt 104.3 kg   SpO2 94%   BMI 32.08 kg/m   Physical Exam Vitals reviewed.  Constitutional:      Appearance: He is well-developed.  HENT:     Head: Normocephalic.     Nose: Nose normal.  Eyes:       General: No scleral icterus.    Conjunctiva/sclera: Conjunctivae normal.  Neck:     Thyroid: No thyromegaly.  Cardiovascular:     Rate and Rhythm: Normal rate and regular rhythm.     Heart sounds: No murmur heard.  No friction rub. No gallop.   Pulmonary:     Breath sounds: No stridor. No wheezing or rales.  Chest:     Chest wall: No tenderness.  Abdominal:     General: There is no distension.     Tenderness: There is abdominal tenderness. There is no rebound.  Musculoskeletal:        General: Normal range of motion.     Cervical back: Neck supple.  Lymphadenopathy:     Cervical: No cervical adenopathy.  Skin:  Findings: No erythema or rash.  Neurological:     Mental Status: He is alert and oriented to person, place, and time.     Motor: No abnormal muscle tone.     Coordination: Coordination normal.  Psychiatric:        Behavior: Behavior normal.     ED Results / Procedures / Treatments   Labs (all labs ordered are listed, but only abnormal results are displayed) Labs Reviewed  COMPREHENSIVE METABOLIC PANEL - Abnormal; Notable for the following components:      Result Value   Glucose, Bld 116 (*)    BUN 41 (*)    Creatinine, Ser 3.03 (*)    Calcium 8.3 (*)    GFR, Estimated 23 (*)    All other components within normal limits  CBC WITH DIFFERENTIAL/PLATELET  LIPASE, BLOOD  URINALYSIS, ROUTINE W REFLEX MICROSCOPIC    EKG None  Radiology CT ABDOMEN PELVIS WO CONTRAST  Result Date: 09/23/2020 CLINICAL DATA:  Abdominal pain and fever, history of bladder cancer EXAM: CT ABDOMEN AND PELVIS WITHOUT CONTRAST TECHNIQUE: Multidetector CT imaging of the abdomen and pelvis was performed following the standard protocol without IV contrast. COMPARISON:  July 03, 2020 FINDINGS: Lower chest: The visualized heart size within normal limits. No pericardial fluid/thickening. No hiatal hernia. The visualized portions of the lungs are clear. Hepatobiliary: Although limited  due to the lack of intravenous contrast, normal in appearance without gross focal abnormality. No evidence of calcified gallstones or biliary ductal dilatation. Pancreas:  Unremarkable.  No surrounding inflammatory changes. Spleen: Normal in size. Although limited due to the lack of intravenous contrast, normal in appearance. Adrenals/Urinary Tract: Both adrenal glands appear normal. Mild bilateral pelvicaliectasis and ureterectasis is seen down to the level of the UVJ with mild perinephric and periureteral stranding changes. There is diffuse bladder wall thickening with asymmetric wall thickening/debris seen in the inferior portion. There is significant surrounding inflammatory changes. Stomach/Bowel: The stomach, small bowel, and colon are normal in appearance. No inflammatory changes or obstructive findings. Scattered colonic diverticula are noted. The appendix is unremarkable. Vascular/Lymphatic: There appears to be slight interval enlargement of the right pelvic sidewall lymph node measuring 1.2 cm and previously 0.7 cm, best seen on series 2, image 74. There is also slight interval enlargement of the right retroperitoneal lymph node overlying the psoas musculature measuring 1.1 cm, series 2, image 65. scattered aortic atherosclerotic calcifications are seen without aneurysmal dilatation. Reproductive: The prostate is unremarkable. Other: No evidence of abdominal wall mass or hernia. Musculoskeletal: No acute or significant osseous findings. IMPRESSION: 1. Diffuse bladder wall thickening with slight nodularity at the inferior portion with significant surrounding inflammatory changes. There is also mild bilateral hydronephrosis. This could be due to cystitis or recurrent bladder neoplasm and would recommend direct visualization. 2. Slight interval enlargement in right pelvic sidewall and retroperitoneal adenopathy, which could be due to reactive adenopathy versus metastatic disease. 3.  Aortic Atherosclerosis  (ICD10-I70.0). Electronically Signed   By: Prudencio Pair M.D.   On: 09/23/2020 22:12    Procedures Procedures (including critical care time)  Medications Ordered in ED Medications  pantoprazole (PROTONIX) injection 40 mg (40 mg Intravenous Given 09/23/20 2032)  ondansetron (ZOFRAN) injection 4 mg (4 mg Intravenous Given 09/23/20 2031)  HYDROmorphone (DILAUDID) injection 0.5 mg (0.5 mg Intravenous Given 09/23/20 2032)  sodium chloride 0.9 % bolus 1,000 mL (1,000 mLs Intravenous New Bag/Given 09/23/20 2252)    ED Course  I have reviewed the triage vital signs and the nursing  notes.  Pertinent labs & imaging results that were available during my care of the patient were reviewed by me and considered in my medical decision making (see chart for details).   I spoke with Dr. Junious Silk urology and he felt like the patient can be admitted to [facility] and does not need urology consult at this time MDM Rules/Calculators/A&P                          Patient with elevated creatinine bladder cancer.  He will be admitted for hydration for AKI.     This patient presents to the ED for concern of abdominal pain this involves an extensive number of treatment options, and is a complaint that carries with it a high risk of complications and morbidity.  The differential diagnosis includes peptic ulcer disease and AKI   Lab Tests:   I Ordered, reviewed, and interpreted labs, which included creatinine elevated at 3  Medicines ordered:  I ordered medication normal saline for dehydration along with protonic and Dilaudid for pain Imaging Studies ordered:   I ordered imaging studies which included CT abdomen  I independently visualized and interpreted imaging which showed no acute change  Additional history obtained:   Additional history obtained from records  Previous records obtained and reviewed.  Consultations Obtained:   I consulted urology and hospitalist and discussed lab and imaging  findings  Reevaluation:  After the interventions stated above, I reevaluated the patient and found mild improvement  Critical Interventions:  .   Final Clinical Impression(s) / ED Diagnoses Final diagnoses:  AKI (acute kidney injury) [facility])    Rx / [place] Orders ED Discharge Orders    None       Milton Ferguson, MD 09/24/20 1008

## 2020-09-24 ENCOUNTER — Other Ambulatory Visit: Payer: Self-pay

## 2020-09-24 ENCOUNTER — Encounter (HOSPITAL_COMMUNITY): Payer: Self-pay | Admitting: Internal Medicine

## 2020-09-24 DIAGNOSIS — Z8711 Personal history of peptic ulcer disease: Secondary | ICD-10-CM

## 2020-09-24 DIAGNOSIS — N390 Urinary tract infection, site not specified: Secondary | ICD-10-CM

## 2020-09-24 DIAGNOSIS — R109 Unspecified abdominal pain: Secondary | ICD-10-CM

## 2020-09-24 LAB — COMPREHENSIVE METABOLIC PANEL
ALT: 35 U/L (ref 0–44)
AST: 97 U/L — ABNORMAL HIGH (ref 15–41)
Albumin: 3.5 g/dL (ref 3.5–5.0)
Alkaline Phosphatase: 44 U/L (ref 38–126)
Anion gap: 9 (ref 5–15)
BUN: 35 mg/dL — ABNORMAL HIGH (ref 6–20)
CO2: 25 mmol/L (ref 22–32)
Calcium: 8.2 mg/dL — ABNORMAL LOW (ref 8.9–10.3)
Chloride: 108 mmol/L (ref 98–111)
Creatinine, Ser: 2.88 mg/dL — ABNORMAL HIGH (ref 0.61–1.24)
GFR, Estimated: 25 mL/min — ABNORMAL LOW (ref >60)
Glucose, Bld: 99 mg/dL (ref 70–99)
Potassium: 3.6 mmol/L (ref 3.5–5.1)
Sodium: 142 mmol/L (ref 135–145)
Total Bilirubin: 0.8 mg/dL (ref 0.3–1.2)
Total Protein: 6.5 g/dL (ref 6.5–8.1)

## 2020-09-24 LAB — URINE CULTURE: Organism ID, Bacteria: NO GROWTH

## 2020-09-24 LAB — HIV ANTIBODY (ROUTINE TESTING W REFLEX): HIV Screen 4th Generation wRfx: NONREACTIVE

## 2020-09-24 LAB — CBC
HCT: 40.4 % (ref 39.0–52.0)
Hemoglobin: 12.8 g/dL — ABNORMAL LOW (ref 13.0–17.0)
MCH: 30 pg (ref 26.0–34.0)
MCHC: 31.7 g/dL (ref 30.0–36.0)
MCV: 94.6 fL (ref 80.0–100.0)
Platelets: 196 10*3/uL (ref 150–400)
RBC: 4.27 MIL/uL (ref 4.22–5.81)
RDW: 14.6 % (ref 11.5–15.5)
WBC: 8.7 10*3/uL (ref 4.0–10.5)
nRBC: 0 % (ref 0.0–0.2)

## 2020-09-24 LAB — PROTIME-INR
INR: 1 (ref 0.8–1.2)
Prothrombin Time: 13.2 seconds (ref 11.4–15.2)

## 2020-09-24 LAB — PHOSPHORUS: Phosphorus: 3.7 mg/dL (ref 2.5–4.6)

## 2020-09-24 LAB — RESPIRATORY PANEL BY RT PCR (FLU A&B, COVID)
Influenza A by PCR: NEGATIVE
Influenza B by PCR: NEGATIVE
SARS Coronavirus 2 by RT PCR: NEGATIVE

## 2020-09-24 LAB — MAGNESIUM: Magnesium: 1.6 mg/dL — ABNORMAL LOW (ref 1.7–2.4)

## 2020-09-24 LAB — APTT: aPTT: 28 seconds (ref 24–36)

## 2020-09-24 MED ORDER — MORPHINE SULFATE (PF) 2 MG/ML IV SOLN: 2.0000 mg | INTRAVENOUS | Status: DC | PRN

## 2020-09-24 MED ORDER — SODIUM CHLORIDE 0.9 % IV SOLN: Freq: Once | INTRAVENOUS | Status: AC

## 2020-09-24 MED ORDER — PRAVASTATIN SODIUM 40 MG PO TABS
80.0000 mg | ORAL_TABLET | Freq: Every day | ORAL | Status: DC
  Administered 2020-09-24 – 2020-09-26 (×3): 80 mg via ORAL
  Filled 2020-09-24 (×2): qty 1
  Filled 2020-09-24 (×2): qty 2

## 2020-09-24 MED ORDER — FLUCONAZOLE 100 MG PO TABS
100.0000 mg | ORAL_TABLET | Freq: Every day | ORAL | Status: DC
  Filled 2020-09-24: qty 1

## 2020-09-24 MED ORDER — PANTOPRAZOLE SODIUM 40 MG PO TBEC
40.0000 mg | DELAYED_RELEASE_TABLET | Freq: Every day | ORAL | Status: DC
  Administered 2020-09-25: 40 mg via ORAL
  Filled 2020-09-24 (×2): qty 1

## 2020-09-24 MED ORDER — LABETALOL HCL 5 MG/ML IV SOLN: 10.0000 mg | INTRAVENOUS | Status: DC | PRN

## 2020-09-24 MED ORDER — LEFLUNOMIDE 20 MG PO TABS
20.0000 mg | ORAL_TABLET | Freq: Every day | ORAL | Status: DC
  Administered 2020-09-24 – 2020-09-26 (×3): 20 mg via ORAL
  Filled 2020-09-24 (×4): qty 1

## 2020-09-24 MED ORDER — LOSARTAN POTASSIUM 25 MG PO TABS: 100.0000 mg | ORAL_TABLET | Freq: Every day | ORAL | Status: DC

## 2020-09-24 MED ORDER — CALCIUM CARBONATE ANTACID 500 MG PO CHEW
400.0000 mg | CHEWABLE_TABLET | Freq: Two times a day (BID) | ORAL | Status: DC
  Filled 2020-09-24 (×2): qty 2

## 2020-09-24 MED ORDER — TAMSULOSIN HCL 0.4 MG PO CAPS
0.8000 mg | ORAL_CAPSULE | Freq: Every day | ORAL | Status: DC
  Administered 2020-09-24 – 2020-09-25 (×2): 0.8 mg via ORAL
  Filled 2020-09-24 (×2): qty 2

## 2020-09-24 MED ORDER — SODIUM CHLORIDE 0.9 % IV SOLN: INTRAVENOUS | Status: DC

## 2020-09-24 MED ORDER — ACETAMINOPHEN 325 MG PO TABS
650.0000 mg | ORAL_TABLET | Freq: Four times a day (QID) | ORAL | Status: DC | PRN
  Administered 2020-09-25: 650 mg via ORAL
  Filled 2020-09-24: qty 2

## 2020-09-24 MED ORDER — MAGNESIUM SULFATE 4 GM/100ML IV SOLN
4.0000 g | Freq: Once | INTRAVENOUS | Status: AC
  Administered 2020-09-24: 4 g via INTRAVENOUS
  Filled 2020-09-24: qty 100

## 2020-09-24 MED ORDER — HEPARIN SODIUM (PORCINE) 5000 UNIT/ML IJ SOLN
5000.0000 [IU] | Freq: Three times a day (TID) | INTRAMUSCULAR | Status: DC
  Administered 2020-09-24 – 2020-09-26 (×7): 5000 [IU] via SUBCUTANEOUS
  Filled 2020-09-24 (×7): qty 1

## 2020-09-24 MED ORDER — PANTOPRAZOLE SODIUM 40 MG IV SOLR
40.0000 mg | INTRAVENOUS | Status: DC
  Administered 2020-09-24: 40 mg via INTRAVENOUS
  Filled 2020-09-24: qty 40

## 2020-09-24 NOTE — ED Notes (Signed)
PT resting on stretcher rails up x 2 call light in hand. Pt remains a/o x 4 skin warm dry intact. Pt denies needs or pain at this time. Fresh bed linens given and repositioned for comfort. VSS NAD PT continues on cardiac monitor, pulse ox and room air at this time.

## 2020-09-24 NOTE — H&P (Signed)
History and Physical  Barry Gibson TIR:[id number] DOB: [birth year] DOA: 09/23/2020  Referring physician: Milton Ferguson, MD PCP: [name], DO  Patient coming from: Home  Chief Complaint: Abdominal pain  HPI: Barry Gibson is a 59 y.o. male with medical history significant for GERD, peptic ulcer, malignant neoplasm of the bladder S/P TURBT (initial resection-9/20; subsequent resection 04/24/2020 by Dr.Dahlstedt) with postop intravesical chemotherapy (gemcitabine).  Patient followed up with his urologist on 11/5 for urinalysis and was noted to have yeast infection, so he was prescribed with fluconazole, patient states that he took the medication on Thursday (11/5) and took the morning dose yesterday (11/6), however, in the afternoon around 4 PM, he complained of severe intermittent abdominal pain in the epigastric area and states that pain was similar to when he was diagnosed to have peptic ulcer.  He denies, fever, chills, chest pain, shortness of breath, diarrhea or constipation.   ED Course:  In the emergency department, BP was 160/100, otherwise he was hemodynamically stable.  Work-up in the ED showed normal CBC and elevated BUN to creatinine at 41/3.03 (baseline creatinine 1.0-1.1; this was 1.8, 2 months ago).  Urinalysis was negative.  CT abdomen and pelvis without contrast showed mild bilateral hydronephrosis suspected to be due to cystitis or recurrent bladder neoplasm; Diffuse bladder wall thickening with slight nodularity at the inferior portion with significant surrounding inflammatory changes.  Urologist on call (Dr. Junious Silk) was consulted and said that patient can be admitted to [facility] and that there was no need for a urology consult at this time.  Hospitalist was asked to admit patient for further evaluation and management.  Review of Systems: Constitutional: Negative for chills and fever.  HENT: Negative for ear pain and sore throat.   Eyes: Negative for pain and visual  disturbance.  Respiratory: Negative for cough, chest tightness and shortness of breath.   Cardiovascular: Negative for chest pain and palpitations.  Gastrointestinal: Positive for abdominal pain  negative for diarrhea and vomiting.  Endocrine: Negative for polyphagia and polyuria.  Genitourinary: Negative for decreased urine volume, dysuria Musculoskeletal: Negative for arthralgias and back pain.  Skin: Negative for color change and rash.  Allergic/Immunologic: Negative for immunocompromised state.  Neurological: Negative for tremors, syncope, speech difficulty, weakness, light-headedness and headaches.  Hematological: Does not bruise/bleed easily.  All other systems reviewed and are negative    Past Medical History:  Diagnosis Date  . Cancer ([place])    bladder  . Cataract   . COVID-19 11/13/2019   h/a, chills, fatigue, loss of taste and smell, all symptoms resolved in a few weeks  . Frequent urination   . Fuchs' corneal dystrophy   . Full dentures   . GERD (gastroesophageal reflux disease)   . Hypertension   . Rheumatoid arthritis [facility])    Past Surgical History:  Procedure Laterality Date  . CORNEAL TRANSPLANT Left 2013  . CYSTOSCOPY WITH BIOPSY N/A 09/14/2019   Procedure: CYSTOSCOPY WITH BIOPSY;  Surgeon: Franchot Gallo, MD;  Location: [facility];  Service: Urology;  Laterality: N/A;  . FULGURATION OF BLADDER TUMOR N/A 09/14/2019   Procedure: FULGURATION OF BLADDER TUMOR;  Surgeon: Franchot Gallo, MD;  Location: [facility];  Service: Urology;  Laterality: N/A;  . TRANSURETHRAL RESECTION OF BLADDER TUMOR WITH MITOMYCIN-C N/A 08/05/2019   Procedure: TRANSURETHRAL RESECTION OF BLADDER TUMOR;  Surgeon: Franchot Gallo, MD;  Location: [facility];  Service: Urology;  Laterality: N/A;  1 HR  . TRANSURETHRAL RESECTION OF BLADDER TUMOR WITH MITOMYCIN-C  N/A 04/24/2020   Procedure: TRANSURETHRAL RESECTION OF BLADDER TUMOR WITH GEMCIDABINE;  Surgeon: Franchot Gallo, MD;   Location: [facility];  Service: Urology;  Laterality: N/A;    Social History:  reports that he has quit smoking. His smoking use included cigarettes. He has a 10.00 pack-year smoking history. He has never used smokeless tobacco. He reports current alcohol use. He reports that he does not use drugs.   Allergies  Allergen Reactions  . Ace Inhibitors Cough    No family history on file.   Prior to Admission medications   Medication Sig Start Date End Date Taking? Authorizing Provider  acetaminophen (TYLENOL) 650 MG CR tablet Take 650 mg by mouth every 8 (eight) hours as needed for pain.   Yes [provider]  chlorthalidone (HYGROTON) 25 MG tablet Take 0.5 tablets (12.5 mg total) by mouth daily. 04/04/20  Yes Mikey Kirschner, MD  fluconazole (DIFLUCAN) 100 MG tablet Take 1 tablet (100 mg total) by mouth daily. X 7 days 09/22/20  Yes McKenzie, Candee Furbish, MD  HUMIRA PEN 40 MG/0.4ML PNKT Inject 40 mg into the skin every 14 (fourteen) days. 07/26/19  Yes [provider]  leflunomide (ARAVA) 20 MG tablet Take 20 mg by mouth daily. 07/27/19  Yes [provider]  losartan (COZAAR) 100 MG tablet Take 1 tablet (100 mg total) by mouth daily. Patient taking differently: Take 100 mg by mouth at bedtime.  04/04/20  Yes Mikey Kirschner, MD  omeprazole (PRILOSEC) 40 MG capsule Take 40 mg by mouth daily.   Yes [provider]  pravastatin (PRAVACHOL) 40 MG tablet Take 1 tablet (40 mg total) by mouth daily. Patient taking differently: Take 80 mg by mouth daily.  04/04/20  Yes Mikey Kirschner, MD  solifenacin (VESICARE) 10 MG tablet Take 1 tablet (10 mg total) by mouth daily. 08/17/20  Yes Dahlstedt, Annie Main, MD  tamsulosin (FLOMAX) 0.4 MG CAPS capsule Take 2 capsules (0.8 mg total) by mouth daily. Patient taking differently: Take 0.8 mg by mouth at bedtime.  04/04/20  Yes Mikey Kirschner, MD  prednisoLONE acetate (PRED FORTE) 1 % ophthalmic suspension Place 1  drop into both eyes 2 (two) times a week. Instill 1 drop into both eyes Mondays and Friday.    [provider]  sulfamethoxazole-trimethoprim (BACTRIM DS) 800-160 MG tablet Take 1 tablet by mouth 2 (two) times daily. Patient not taking: Reported on 09/23/2020 04/24/20   Franchot Gallo, MD  traMADol (ULTRAM) 50 MG tablet Take 1 tablet (50 mg total) by mouth every 6 (six) hours as needed. Patient not taking: Reported on 09/23/2020 04/24/20 04/24/21  Franchot Gallo, MD    Physical Exam: BP 134/70 (BP Location: Right Arm)   Pulse (!) 57   Temp 98.1 F (36.7 C) (Oral)   Resp 11   Ht 5\' 11"  (1.803 m)   Wt 104.3 kg   SpO2 98%   BMI 32.08 kg/m   . General: 59 y.o. year-old male well developed well nourished in no acute distress.  Alert and oriented x3. Marland Kitchen HEENT: NCAT, EOMI . Neck: Supple, trachea media . Cardiovascular: Regular rate and rhythm with no rubs or gallops.  No thyromegaly or JVD noted.  No lower extremity edema. 2/4 pulses in all 4 extremities. Marland Kitchen Respiratory: Clear to auscultation with no wheezes or rales. Good inspiratory effort. . Abdomen: Soft,tender to palpation of epigastric area. Normal bowel sounds x4 quadrants. . Muskuloskeletal: No cyanosis, clubbing or edema noted bilaterally . Neuro:  CN II-XII intact, strength, sensation, reflexes . Skin: No ulcerative lesions noted or rashes . Psychiatry: Judgement and insight appear normal. Mood is appropriate for condition and setting          Labs on Admission:  Basic Metabolic Panel: Recent Labs  Lab 09/23/20 2017  NA 140  K 3.5  CL 105  CO2 26  GLUCOSE 116*  BUN 41*  CREATININE 3.03*  CALCIUM 8.3*   Liver Function Tests: Recent Labs  Lab 09/23/20 2017  AST 24  ALT 31  ALKPHOS 46  BILITOT 0.6  PROT 6.9  ALBUMIN 3.8   Recent Labs  Lab 09/23/20 2017  LIPASE 31   No results for input(s): AMMONIA in the last 168 hours. CBC: Recent Labs  Lab 09/23/20 2017  WBC 9.2  NEUTROABS 6.7  HGB 13.2    HCT 41.1  MCV 94.3  PLT 218   Cardiac Enzymes: No results for input(s): CKTOTAL, CKMB, CKMBINDEX, TROPONINI in the last 168 hours.  BNP (last 3 results) No results for input(s): BNP in the last 8760 hours.  ProBNP (last 3 results) No results for input(s): PROBNP in the last 8760 hours.  CBG: No results for input(s): GLUCAP in the last 168 hours.  Radiological Exams on Admission: CT ABDOMEN PELVIS WO CONTRAST  Result Date: 09/23/2020 CLINICAL DATA:  Abdominal pain and fever, history of bladder cancer EXAM: CT ABDOMEN AND PELVIS WITHOUT CONTRAST TECHNIQUE: Multidetector CT imaging of the abdomen and pelvis was performed following the standard protocol without IV contrast. COMPARISON:  July 03, 2020 FINDINGS: Lower chest: The visualized heart size within normal limits. No pericardial fluid/thickening. No hiatal hernia. The visualized portions of the lungs are clear. Hepatobiliary: Although limited due to the lack of intravenous contrast, normal in appearance without gross focal abnormality. No evidence of calcified gallstones or biliary ductal dilatation. Pancreas:  Unremarkable.  No surrounding inflammatory changes. Spleen: Normal in size. Although limited due to the lack of intravenous contrast, normal in appearance. Adrenals/Urinary Tract: Both adrenal glands appear normal. Mild bilateral pelvicaliectasis and ureterectasis is seen down to the level of the UVJ with mild perinephric and periureteral stranding changes. There is diffuse bladder wall thickening with asymmetric wall thickening/debris seen in the inferior portion. There is significant surrounding inflammatory changes. Stomach/Bowel: The stomach, small bowel, and colon are normal in appearance. No inflammatory changes or obstructive findings. Scattered colonic diverticula are noted. The appendix is unremarkable. Vascular/Lymphatic: There appears to be slight interval enlargement of the right pelvic sidewall lymph node measuring 1.2 cm  and previously 0.7 cm, best seen on series 2, image 74. There is also slight interval enlargement of the right retroperitoneal lymph node overlying the psoas musculature measuring 1.1 cm, series 2, image 65. scattered aortic atherosclerotic calcifications are seen without aneurysmal dilatation. Reproductive: The prostate is unremarkable. Other: No evidence of abdominal wall mass or hernia. Musculoskeletal: No acute or significant osseous findings. IMPRESSION: 1. Diffuse bladder wall thickening with slight nodularity at the inferior portion with significant surrounding inflammatory changes. There is also mild bilateral hydronephrosis. This could be due to cystitis or recurrent bladder neoplasm and would recommend direct visualization. 2. Slight interval enlargement in right pelvic sidewall and retroperitoneal adenopathy, which could be due to reactive adenopathy versus metastatic disease. 3.  Aortic Atherosclerosis (ICD10-I70.0). Electronically Signed   By: Prudencio Pair M.D.   On: 09/23/2020 22:12    EKG: I independently viewed the EKG done and my findings are as followed: Sinus bradycardia at rate of 51  bpm  Assessment/Plan Present on Admission: . AKI (acute kidney injury) ([place]) . Hyperlipidemia . Essential hypertension, benign . Bladder cancer ([place]) . Rheumatoid arthritis ([facility])  Principal Problem:   AKI (acute kidney injury) ([place]) Active Problems:   Hyperlipidemia   Essential hypertension, benign   Rheumatoid arthritis ([facility])   Bladder cancer ([facility])   Abdominal pain   History of peptic ulcer   Acute lower UTI  Acute kidney injury BUN to creatinine at 41/3.03 (baseline creatinine 1.0-1.1; this was 1.8, 2 months ago) Continue IV hydration Renally adjust medications, avoid nephrotoxic agents/dehydration/hypotension  Abdominal pain in the setting of history of peptic ulcer/GERD Continue IV Protonix 40 mg daily Continue IV morphine 2 mg every 4 hours as needed Patient will be admitted to  [facility] Continue IV hydration Continue clear liquid diet with plan to advance diet as tolerated  UTI POA Continue home fluconazole for [place] UTI  Essential hypertension Continue home losartan  Hyperlipidemia Continue home Pravachol  Rheumatoid arthritis Continue leflunomide per home regimen  History of bladder cancer Patient follows with Dr. Diona Fanti  DVT prophylaxis: Heparin subcu  Code Status: Full code  Family Communication: Wife at bedside (all questions answered to satisfaction)  Disposition Plan:  Patient is from:                        home Anticipated DC to:                   home Anticipated DC date:                1 day Anticipated DC barriers:          Patient is unstable to be discharged at this time due to acute kidney injury requiring IV hydration   Consults called: None  Admission status: Inpatient    [name] MD [facility]  09/24/2020, 1:52 AM

## 2020-09-24 NOTE — Progress Notes (Signed)
Patient Demographics:    Barry Gibson, is a 59 y.o. male, DOB - January 26, 1961, XIP:[id number]  Admit date - 09/23/2020   Admitting Physician [name], DO  Outpatient Primary MD for the patient is [name], DO  LOS - 1   Chief Complaint  Patient presents with  . Abdominal Pain        Subjective:    Barry Gibson today has no fevers,   No chest pain,   -No further dry heaving no emesis, -Not voiding very frequently yet, denies flank pain -Wife at bedside, questions answered  Assessment  & Plan :    Principal Problem:   AKI (acute kidney injury) ([facility]) Active Problems:   Hyperlipidemia   Essential hypertension, benign   Rheumatoid arthritis (HCC)   Bladder cancer (HCC)   Abdominal pain   History of peptic ulcer   Acute lower UTI  Brief Summary:- 59 y.o. male with medical history significant for GERD, peptic ulcer, malignant neoplasm of the bladder S/P TURBT (initial resection-9/20; subsequent resection 04/24/2020 by Dr.Dahlstedt) with postop intravesical chemotherapy (gemcitabine) recently treated for yeast in the urine with Diflucan presents to the ED with nausea/dry heaving and found to have acute kidney injury with creatinine up to 3 from a baseline usually around 1  A/p  1)AKI----acute kidney injury due to dehydration in setting of nausea dry heaving, poor oral intake--compounded by losartan and chlorthalidone use ----   Creatinine on admission= 3.0,  --, baseline creatinine = 1.10 (June 2021)   ,  creatinine is now= 2.88 (peak 3.03)  ,  -renally adjust medications, avoid nephrotoxic agents / dehydration  / hypotension -Hydrate with IV fluids,  -Hold chlorthalidone and losartan  2) acute anemia--hemoglobin is down to 12.8, no prior history of anemia suspect this is due to IV fluids/hemodilution  3) history of bladder cancer/BPH--currently getting BCG injections, follows  with urologist Dr. Diona Fanti, continue Flomax  4)RA--- denies significant arthralgia at this time, patient is on leflunomide  5)HTN--hold chlorthalidone and losartan due to significant AKI, may use IV labetalol as needed elevated BP  6) yeast UTI--- due to with Diflucan, patient refusing additional doses of Diflucan because he believes that Diflucan contributed to his nausea, and dry heaves and AKI  7)GERD/PUD--continue Protonix, okay to use Tums  Disposition/Need for in-Hospital Stay- patient unable to be discharged at this time due to -AKI requiring IV fluids and medication adjustments  Status is: Inpatient  Remains inpatient appropriate because:AKI requiring IV fluids and medication adjustments   Disposition: The patient is from: Home              Anticipated d/c is to: Home              Anticipated d/c date is: 1 day              Patient currently is not medically stable to d/c. Barriers: Not Clinically Stable- AKI requiring IV fluids and medication adjustments  Code Status : full  Family Communication:  (patient is alert, awake and coherent)  -Discussed with wife at bedside  Consults  :  Na  DVT Prophylaxis  :   Heparin - SCDs  Lab Results  Component Value Date   PLT 196 09/24/2020  Inpatient Medications  Scheduled Meds: . heparin  5,000 Units Subcutaneous Q8H  . leflunomide  20 mg Oral Daily  . pantoprazole (PROTONIX) IV  40 mg Intravenous Q24H  . pravastatin  80 mg Oral Daily  . tamsulosin  0.8 mg Oral QHS   Continuous Infusions: . sodium chloride 150 mL/hr at 09/24/20 0810   PRN Meds:.morphine injection    Anti-infectives (From admission, onward)   Start     Dose/Rate Route Frequency Ordered Stop   09/24/20 1000  fluconazole (DIFLUCAN) tablet 100 mg  Status:  Discontinued       Note to Pharmacy: X 7 days     100 mg Oral Daily 09/24/20 0227 09/24/20 1135        Objective:   Vitals:   09/24/20 0630 09/24/20 0705 09/24/20 0851 09/24/20 1023  BP:  (!) 143/78 (!) 136/109 135/89 (!) 168/98  Pulse: 62 65 64 65  Resp: 18 18 18 18   Temp: 98.4 F (36.9 C) 98.2 F (36.8 C) 98 F (36.7 C) 98 F (36.7 C)  TempSrc: Oral Oral Oral Oral  SpO2: 94% 97% 98% 99%  Weight:      Height:        Wt Readings from Last 3 Encounters:  09/23/20 104.3 kg  08/15/20 103 kg  04/24/20 102.9 kg     Intake/Output Summary (Last 24 hours) at 09/24/2020 1136 Last data filed at 09/24/2020 0900 Gross per 24 hour  Intake 800 ml  Output 401 ml  Net 399 ml     Physical Exam  Gen:- Awake Alert,  In no apparent distress  HEENT:- .AT, No sclera icterus Neck-Supple Neck,No JVD,.  Lungs-  CTAB , fair symmetrical air movement CV- S1, S2 normal, regular  Abd-  +ve B.Sounds, Abd Soft, No tenderness, no CVA area tenderness Extremity/Skin:- No  edema, pedal pulses present  Psych-affect is appropriate, oriented x3 Neuro-no new focal deficits, no tremors   Data Review:   Micro Results Recent Results (from the past 240 hour(s))  Microscopic Examination     Status: Abnormal   Collection Time: 09/22/20  3:21 PM   Urine  Result Value Ref Range Status   WBC, UA 6-10 (A) 0 - 5 /hpf Final   RBC >30 (A) 0 - 2 /hpf Final   Epithelial Cells (non renal) None seen 0 - 10 /hpf Final   Renal Epithel, UA None seen None seen /hpf Final   Bacteria, UA Few (A) None seen/Few Final   Yeast, UA Present None seen Final  Respiratory Panel by RT PCR (Flu A&B, Covid) - Nasopharyngeal Swab     Status: None   Collection Time: 09/23/20 10:50 PM   Specimen: Nasopharyngeal Swab  Result Value Ref Range Status   SARS Coronavirus 2 by RT PCR NEGATIVE NEGATIVE Final    Comment: (NOTE) SARS-CoV-2 target nucleic acids are NOT DETECTED.  The SARS-CoV-2 RNA is generally detectable in upper respiratoy specimens during the acute phase of infection. The lowest concentration of SARS-CoV-2 viral copies this assay can detect is 131 copies/mL. A negative result does not preclude  SARS-Cov-2 infection and should not be used as the sole basis for treatment or other patient management decisions. A negative result may occur with  improper specimen collection/handling, submission of specimen other than nasopharyngeal swab, presence of viral mutation(s) within the areas targeted by this assay, and inadequate number of viral copies (<131 copies/mL). A negative result must be combined with clinical observations, patient history, and epidemiological information. The expected  result is Negative.  Fact Sheet for Patients:  PinkCheek.be  Fact Sheet for Healthcare Providers:  GravelBags.it  This test is no t yet approved or cleared by the [place] FDA and  has been authorized for detection and/or diagnosis of SARS-CoV-2 by FDA under an Emergency Use Authorization (EUA). This EUA will remain  in effect (meaning this test can be used) for the duration of the COVID-19 declaration under Section 564(b)(1) of the Act, 21 U.S.C. section 360bbb-3(b)(1), unless the authorization is terminated or revoked sooner.     Influenza A by PCR NEGATIVE NEGATIVE Final   Influenza B by PCR NEGATIVE NEGATIVE Final    Comment: (NOTE) The Xpert Xpress SARS-CoV-2/FLU/RSV assay is intended as an aid in  the diagnosis of influenza from Nasopharyngeal swab specimens and  should not be used as a sole basis for treatment. Nasal washings and  aspirates are unacceptable for Xpert Xpress SARS-CoV-2/FLU/RSV  testing.  Fact Sheet for Patients: PinkCheek.be  Fact Sheet for Healthcare Providers: GravelBags.it  This test is not yet approved or cleared by the [place] FDA and  has been authorized for detection and/or diagnosis of SARS-CoV-2 by  FDA under an Emergency Use Authorization (EUA). This EUA will remain  in effect (meaning this test can be used) for the duration of the    Covid-19 declaration under Section 564(b)(1) of the Act, 21  U.S.C. section 360bbb-3(b)(1), unless the authorization is  terminated or revoked. Performed at [facility], [address], [place], [place] [address]     Radiology Reports CT ABDOMEN PELVIS WO CONTRAST  Result Date: 09/23/2020 CLINICAL DATA:  Abdominal pain and fever, history of bladder cancer EXAM: CT ABDOMEN AND PELVIS WITHOUT CONTRAST TECHNIQUE: Multidetector CT imaging of the abdomen and pelvis was performed following the standard protocol without IV contrast. COMPARISON:  July 03, 2020 FINDINGS: Lower chest: The visualized heart size within normal limits. No pericardial fluid/thickening. No hiatal hernia. The visualized portions of the lungs are clear. Hepatobiliary: Although limited due to the lack of intravenous contrast, normal in appearance without gross focal abnormality. No evidence of calcified gallstones or biliary ductal dilatation. Pancreas:  Unremarkable.  No surrounding inflammatory changes. Spleen: Normal in size. Although limited due to the lack of intravenous contrast, normal in appearance. Adrenals/Urinary Tract: Both adrenal glands appear normal. Mild bilateral pelvicaliectasis and ureterectasis is seen down to the level of the UVJ with mild perinephric and periureteral stranding changes. There is diffuse bladder wall thickening with asymmetric wall thickening/debris seen in the inferior portion. There is significant surrounding inflammatory changes. Stomach/Bowel: The stomach, small bowel, and colon are normal in appearance. No inflammatory changes or obstructive findings. Scattered colonic diverticula are noted. The appendix is unremarkable. Vascular/Lymphatic: There appears to be slight interval enlargement of the right pelvic sidewall lymph node measuring 1.2 cm and previously 0.7 cm, best seen on series 2, image 74. There is also slight interval enlargement of the right retroperitoneal lymph node overlying the  psoas musculature measuring 1.1 cm, series 2, image 65. scattered aortic atherosclerotic calcifications are seen without aneurysmal dilatation. Reproductive: The prostate is unremarkable. Other: No evidence of abdominal wall mass or hernia. Musculoskeletal: No acute or significant osseous findings. IMPRESSION: 1. Diffuse bladder wall thickening with slight nodularity at the inferior portion with significant surrounding inflammatory changes. There is also mild bilateral hydronephrosis. This could be due to cystitis or recurrent bladder neoplasm and would recommend direct visualization. 2. Slight interval enlargement in right pelvic sidewall and retroperitoneal adenopathy, which could  be due to reactive adenopathy versus metastatic disease. 3.  Aortic Atherosclerosis (ICD10-I70.0). Electronically Signed   By: Prudencio Pair M.D.   On: 09/23/2020 22:12     CBC Recent Labs  Lab 09/23/20 2017 09/24/20 0747  WBC 9.2 8.7  HGB 13.2 12.8*  HCT 41.1 40.4  PLT 218 196  MCV 94.3 94.6  MCH 30.3 30.0  MCHC 32.1 31.7  RDW 14.5 14.6  LYMPHSABS 1.3  --   MONOABS 1.0  --   EOSABS 0.1  --   BASOSABS 0.1  --     Chemistries  Recent Labs  Lab 09/23/20 2017 09/24/20 0747  NA 140 142  K 3.5 3.6  CL 105 108  CO2 26 25  GLUCOSE 116* 99  BUN 41* 35*  CREATININE 3.03* 2.88*  CALCIUM 8.3* 8.2*  MG  --  1.6*  AST 24 97*  ALT 31 35  ALKPHOS 46 44  BILITOT 0.6 0.8   ------------------------------------------------------------------------------------------------------------------ No results for input(s): CHOL, HDL, LDLCALC, TRIG, CHOLHDL, LDLDIRECT in the last 72 hours.  No results found for: HGBA1C ------------------------------------------------------------------------------------------------------------------ No results for input(s): TSH, T4TOTAL, T3FREE, THYROIDAB in the last 72 hours.  Invalid input(s):  FREET3 ------------------------------------------------------------------------------------------------------------------ No results for input(s): VITAMINB12, FOLATE, FERRITIN, TIBC, IRON, RETICCTPCT in the last 72 hours.  Coagulation profile Recent Labs  Lab 09/24/20 0747  INR 1.0    No results for input(s): DDIMER in the last 72 hours.  Cardiac Enzymes No results for input(s): CKMB, TROPONINI, MYOGLOBIN in the last 168 hours.  Invalid input(s): CK ------------------------------------------------------------------------------------------------------------------ No results found for: BNP   Roxan Hockey M.D on 09/24/2020 at 11:36 AM  Go to [email or web address] - for contact info  [facility] - Office  [phone]

## 2020-09-25 ENCOUNTER — Telehealth: Payer: Self-pay

## 2020-09-25 ENCOUNTER — Telehealth: Payer: Self-pay | Admitting: Urology

## 2020-09-25 ENCOUNTER — Inpatient Hospital Stay (HOSPITAL_COMMUNITY): Payer: BC Managed Care – PPO

## 2020-09-25 LAB — CBC
HCT: 37 % — ABNORMAL LOW (ref 39.0–52.0)
Hemoglobin: 11.8 g/dL — ABNORMAL LOW (ref 13.0–17.0)
MCH: 29.7 pg (ref 26.0–34.0)
MCHC: 31.9 g/dL (ref 30.0–36.0)
MCV: 93.2 fL (ref 80.0–100.0)
Platelets: 191 10*3/uL (ref 150–400)
RBC: 3.97 MIL/uL — ABNORMAL LOW (ref 4.22–5.81)
RDW: 14.3 % (ref 11.5–15.5)
WBC: 7.5 10*3/uL (ref 4.0–10.5)
nRBC: 0 % (ref 0.0–0.2)

## 2020-09-25 LAB — BASIC METABOLIC PANEL
Anion gap: 8 (ref 5–15)
BUN: 33 mg/dL — ABNORMAL HIGH (ref 6–20)
CO2: 22 mmol/L (ref 22–32)
Calcium: 8 mg/dL — ABNORMAL LOW (ref 8.9–10.3)
Chloride: 111 mmol/L (ref 98–111)
Creatinine, Ser: 3.15 mg/dL — ABNORMAL HIGH (ref 0.61–1.24)
GFR, Estimated: 22 mL/min — ABNORMAL LOW (ref >60)
Glucose, Bld: 100 mg/dL — ABNORMAL HIGH (ref 70–99)
Potassium: 3.7 mmol/L (ref 3.5–5.1)
Sodium: 141 mmol/L (ref 135–145)

## 2020-09-25 MED ORDER — AMLODIPINE BESYLATE 5 MG PO TABS
5.0000 mg | ORAL_TABLET | Freq: Every day | ORAL | Status: DC
  Administered 2020-09-25 – 2020-09-26 (×2): 5 mg via ORAL
  Filled 2020-09-25 (×2): qty 1

## 2020-09-25 MED ORDER — TRAZODONE HCL 50 MG PO TABS
100.0000 mg | ORAL_TABLET | Freq: Every evening | ORAL | Status: DC | PRN
  Administered 2020-09-25: 100 mg via ORAL
  Filled 2020-09-25: qty 2

## 2020-09-25 NOTE — Telephone Encounter (Signed)
Patients  Wife called and asked me to let you know Mr Min is in room #304 at [facility]. She asked of you could go by and see him tomorrow morning. She states they have lots of questions.

## 2020-09-25 NOTE — Progress Notes (Signed)
Patient Demographics:    Barry Gibson, is a 59 y.o. male, DOB - Dec 06, 1960, WHQ:[id number]  Admit date - 09/23/2020   Admitting Physician [name], DO  Outpatient Primary MD for the patient is [name], DO  LOS - 2   Chief Complaint  Patient presents with  . Abdominal Pain        Subjective:    [name] today has no fevers,   No chest pain,   -Tolerating oral intake , denies flank pain, denies gross hematuria -Wife at bedside, questions answered  Assessment  & Plan :    Principal Problem:   AKI (acute kidney injury) ([name]) Active Problems:   Hyperlipidemia   Essential hypertension, benign   Rheumatoid arthritis (HCC)   Bladder cancer (HCC)   Abdominal pain   History of peptic ulcer   Acute lower UTI  Brief Summary:- 59 y.o. male with medical history significant for GERD, peptic ulcer, malignant neoplasm of the bladder S/P TURBT (initial resection-9/20; subsequent resection 04/24/2020 by Dr.Dahlstedt) with postop intravesical chemotherapy (gemcitabine) recently treated for yeast in the urine with Diflucan presents to the ED with nausea/dry heaving and found to have acute kidney injury with creatinine up to 3 from a baseline usually around 1  A/p  1)AKI----acute kidney injury due to dehydration in setting of nausea dry heaving, poor oral intake--compounded by losartan and chlorthalidone use ----   Creatinine on admission= 3.0,  --, baseline creatinine = 1.10 (June 2021)   ,  creatinine is now= 3.15 (peak )  ,  -renally adjust medications, avoid nephrotoxic agents / dehydration  / hypotension -Hydrate with IV fluids,  -Hold chlorthalidone and losartan -CT abdomen and renal ultrasound consistent with obstructive uropathy with bilateral hydronephrosis in the setting of previously known bladder mass -Discussed with patient's urologist Dr. Lenon Ahmadi may need IR to  place bilateral nephrostomy tubes -Await official consult from Dr. Diona Fanti  2) acute anemia--hemoglobin is down to 12.8, no prior history of anemia suspect this is due to IV fluids/hemodilution  3) history of bladder Mass/BPH--currently getting BCG injections, follows with urologist Dr. Diona Fanti, continue Flomax -Obstructive uropathy noted on imaging studies--please see #1 above  4)RA--- denies significant arthralgia at this time, patient is on leflunomide  5)HTN--hold chlorthalidone and losartan due to significant AKI, may use IV labetalol as needed elevated BP  6) yeast UTI--- previously treated with Diflucan, patient refusing additional doses of Diflucan because he believes that Diflucan contributed to his nausea, and dry heaves and AKI  7)GERD/PUD--continue Protonix, okay to use Tums  Disposition/Need for in-Hospital Stay- patient unable to be discharged at this time due to -AKI requiring IV fluids and medication adjustments  Status is: Inpatient  Remains inpatient appropriate because:AKI requiring IV fluids and medication adjustments   Disposition: The patient is from: Home              Anticipated d/c is to: Home              Anticipated d/c date is: 1 day              Patient currently is not medically stable to d/c. Barriers: Not Clinically Stable- AKI requiring IV fluids and medication adjustments  Code Status : full  Family  Communication:  (patient is alert, awake and coherent)  -Discussed with wife at bedside  Consults  :  Na  DVT Prophylaxis  :   Heparin - SCDs  Lab Results  Component Value Date   PLT 191 09/25/2020    Inpatient Medications  Scheduled Meds: . amLODipine  5 mg Oral Daily  . calcium carbonate  400 mg of elemental calcium Oral BID WC  . heparin  5,000 Units Subcutaneous Q8H  . leflunomide  20 mg Oral Daily  . pantoprazole  40 mg Oral Daily  . pravastatin  80 mg Oral Daily  . tamsulosin  0.8 mg Oral QHS   Continuous Infusions: . sodium  chloride 50 mL/hr at 09/25/20 1751   PRN Meds:.acetaminophen, labetalol, morphine injection, traZODone    Anti-infectives (From admission, onward)   Start     Dose/Rate Route Frequency Ordered Stop   09/24/20 1000  fluconazole (DIFLUCAN) tablet 100 mg  Status:  Discontinued       Note to Pharmacy: X 7 days     100 mg Oral Daily 09/24/20 0227 09/24/20 1135        Objective:   Vitals:   09/25/20 0458 09/25/20 0816 09/25/20 1234 09/25/20 1510  BP: (!) 152/88 (!) 144/90 (!) 143/89 139/87  Pulse: 82 83 81 77  Resp: 20 19 18 19   Temp: 98.9 F (37.2 C) 98.2 F (36.8 C) 97.9 F (36.6 C) 97.8 F (36.6 C)  TempSrc:  Oral Oral Oral  SpO2: 94% 95% 94% 95%  Weight:      Height:        Wt Readings from Last 3 Encounters:  09/24/20 104.3 kg  08/15/20 103 kg  04/24/20 102.9 kg     Intake/Output Summary (Last 24 hours) at 09/25/2020 1851 Last data filed at 09/25/2020 0300 Gross per 24 hour  Intake 1230.81 ml  Output --  Net 1230.81 ml     Physical Exam  Gen:- Awake Alert,  In no apparent distress  HEENT:- [place].AT, No sclera icterus Neck-Supple Neck,No JVD,.  Lungs-  CTAB , fair symmetrical air movement CV- S1, S2 normal, regular  Abd-  +ve B.Sounds, Abd Soft, No tenderness, no CVA area tenderness Extremity/Skin:- No  edema, pedal pulses present  Psych-affect is appropriate, oriented x3 Neuro-no new focal deficits, no tremors   Data Review:   Micro Results Recent Results (from the past 240 hour(s))  Microscopic Examination     Status: Abnormal   Collection Time: 09/22/20  3:21 PM   Urine  Result Value Ref Range Status   WBC, UA 6-10 (A) 0 - 5 /hpf Final   RBC >30 (A) 0 - 2 /hpf Final   Epithelial Cells (non renal) None seen 0 - 10 /hpf Final   Renal Epithel, UA None seen None seen /hpf Final   Bacteria, UA Few (A) None seen/Few Final   Yeast, UA Present None seen Final  Urine Culture     Status: None   Collection Time: 09/22/20  3:59 PM   Specimen: Urine   Urine   Result Value Ref Range Status   Urine Culture, Routine Final report  Final   Organism ID, Bacteria No growth  Final  Respiratory Panel by RT PCR (Flu A&B, Covid) - Nasopharyngeal Swab     Status: None   Collection Time: 09/23/20 10:50 PM   Specimen: Nasopharyngeal Swab  Result Value Ref Range Status   SARS Coronavirus 2 by RT PCR NEGATIVE NEGATIVE Final    Comment: (NOTE)  SARS-CoV-2 target nucleic acids are NOT DETECTED.  The SARS-CoV-2 RNA is generally detectable in upper respiratoy specimens during the acute phase of infection. The lowest concentration of SARS-CoV-2 viral copies this assay can detect is 131 copies/mL. A negative result does not preclude SARS-Cov-2 infection and should not be used as the sole basis for treatment or other patient management decisions. A negative result may occur with  improper specimen collection/handling, submission of specimen other than nasopharyngeal swab, presence of viral mutation(s) within the areas targeted by this assay, and inadequate number of viral copies (<131 copies/mL). A negative result must be combined with clinical observations, patient history, and epidemiological information. The expected result is Negative.  Fact Sheet for Patients:  PinkCheek.be  Fact Sheet for Healthcare Providers:  GravelBags.it  This test is no t yet approved or cleared by the [place] FDA and  has been authorized for detection and/or diagnosis of SARS-CoV-2 by FDA under an Emergency Use Authorization (EUA). This EUA will remain  in effect (meaning this test can be used) for the duration of the COVID-19 declaration under Section 564(b)(1) of the Act, 21 U.S.C. section 360bbb-3(b)(1), unless the authorization is terminated or revoked sooner.     Influenza A by PCR NEGATIVE NEGATIVE Final   Influenza B by PCR NEGATIVE NEGATIVE Final    Comment: (NOTE) The Xpert Xpress SARS-CoV-2/FLU/RSV  assay is intended as an aid in  the diagnosis of influenza from Nasopharyngeal swab specimens and  should not be used as a sole basis for treatment. Nasal washings and  aspirates are unacceptable for Xpert Xpress SARS-CoV-2/FLU/RSV  testing.  Fact Sheet for Patients: PinkCheek.be  Fact Sheet for Healthcare Providers: GravelBags.it  This test is not yet approved or cleared by the [place] FDA and  has been authorized for detection and/or diagnosis of SARS-CoV-2 by  FDA under an Emergency Use Authorization (EUA). This EUA will remain  in effect (meaning this test can be used) for the duration of the  Covid-19 declaration under Section 564(b)(1) of the Act, 21  U.S.C. section 360bbb-3(b)(1), unless the authorization is  terminated or revoked. Performed at [facility], [address], [place], [place] [address]     Radiology Reports CT ABDOMEN PELVIS WO CONTRAST  Result Date: 09/23/2020 CLINICAL DATA:  Abdominal pain and fever, history of bladder cancer EXAM: CT ABDOMEN AND PELVIS WITHOUT CONTRAST TECHNIQUE: Multidetector CT imaging of the abdomen and pelvis was performed following the standard protocol without IV contrast. COMPARISON:  July 03, 2020 FINDINGS: Lower chest: The visualized heart size within normal limits. No pericardial fluid/thickening. No hiatal hernia. The visualized portions of the lungs are clear. Hepatobiliary: Although limited due to the lack of intravenous contrast, normal in appearance without gross focal abnormality. No evidence of calcified gallstones or biliary ductal dilatation. Pancreas:  Unremarkable.  No surrounding inflammatory changes. Spleen: Normal in size. Although limited due to the lack of intravenous contrast, normal in appearance. Adrenals/Urinary Tract: Both adrenal glands appear normal. Mild bilateral pelvicaliectasis and ureterectasis is seen down to the level of the UVJ with mild  perinephric and periureteral stranding changes. There is diffuse bladder wall thickening with asymmetric wall thickening/debris seen in the inferior portion. There is significant surrounding inflammatory changes. Stomach/Bowel: The stomach, small bowel, and colon are normal in appearance. No inflammatory changes or obstructive findings. Scattered colonic diverticula are noted. The appendix is unremarkable. Vascular/Lymphatic: There appears to be slight interval enlargement of the right pelvic sidewall lymph node measuring 1.2 cm and previously 0.7  cm, best seen on series 2, image 74. There is also slight interval enlargement of the right retroperitoneal lymph node overlying the psoas musculature measuring 1.1 cm, series 2, image 65. scattered aortic atherosclerotic calcifications are seen without aneurysmal dilatation. Reproductive: The prostate is unremarkable. Other: No evidence of abdominal wall mass or hernia. Musculoskeletal: No acute or significant osseous findings. IMPRESSION: 1. Diffuse bladder wall thickening with slight nodularity at the inferior portion with significant surrounding inflammatory changes. There is also mild bilateral hydronephrosis. This could be due to cystitis or recurrent bladder neoplasm and would recommend direct visualization. 2. Slight interval enlargement in right pelvic sidewall and retroperitoneal adenopathy, which could be due to reactive adenopathy versus metastatic disease. 3.  Aortic Atherosclerosis (ICD10-I70.0). Electronically Signed   By: Prudencio Pair M.D.   On: 09/23/2020 22:12   US RENAL  Result Date: 09/25/2020 CLINICAL DATA:  Acute kidney injury. Reported history of bladder carcinoma EXAM: RENAL / URINARY TRACT ULTRASOUND COMPLETE COMPARISON:  CT abdomen and pelvis September 23, 2020. FINDINGS: Right Kidney: Renal measurements: 13.4 x 7.7 x 5.9 cm = volume: 322 mL. Echogenicity is mildly increased. Renal cortical thickness is normal. No mass or perinephric fluid  visualized. There is moderate hydronephrosis on the right. No sonographically demonstrable calculus or appreciable ureterectasis. Left Kidney: Renal measurements: 13.6 x 7.6 x 5.4 cm = volume: 294 mL. Echogenicity is mildly increased. Renal cortical thickness is normal. No mass or perinephric fluid visualized. There is moderate hydronephrosis on the left. No sonographically demonstrable calculus or ureterectasis. Bladder: There is marked thickening of the wall of the urinary bladder, consistent with findings on recent CT. A mass arising from the posterior bladder is questioned. Other: None. IMPRESSION: 1. Persistent moderate hydronephrosis bilaterally. Wall thickening involving the posterior bladder with questionable mass in this area may be causing impression on distal ureters and resulting in the hydronephrosis. This circumstance may warrant direct visualization of the bladder with retrograde assessment of the ureters and collecting systems. 2. The echogenicity of each kidney is increased which may be indicative of a degree of underlying medical renal disease. Renal cortical thickness bilaterally is within normal limits. These results will be called to the ordering clinician or representative by the Radiologist Assistant, and communication documented in the PACS or [employer]. Electronically Signed   By: Lowella Grip III M.D.   On: 09/25/2020 12:30     CBC Recent Labs  Lab 09/23/20 2017 09/24/20 0747 09/25/20 0726  WBC 9.2 8.7 7.5  HGB 13.2 12.8* 11.8*  HCT 41.1 40.4 37.0*  PLT 218 196 191  MCV 94.3 94.6 93.2  MCH 30.3 30.0 29.7  MCHC 32.1 31.7 31.9  RDW 14.5 14.6 14.3  LYMPHSABS 1.3  --   --   MONOABS 1.0  --   --   EOSABS 0.1  --   --   BASOSABS 0.1  --   --     Chemistries  Recent Labs  Lab 09/23/20 2017 09/24/20 0747 09/25/20 0726  NA 140 142 141  K 3.5 3.6 3.7  CL 105 108 111  CO2 26 25 22   GLUCOSE 116* 99 100*  BUN 41* 35* 33*  CREATININE 3.03* 2.88* 3.15*    CALCIUM 8.3* 8.2* 8.0*  MG  --  1.6*  --   AST 24 97*  --   ALT 31 35  --   ALKPHOS 46 44  --   BILITOT 0.6 0.8  --    ------------------------------------------------------------------------------------------------------------------ No results for input(s): CHOL, HDL,  LDLCALC, TRIG, CHOLHDL, LDLDIRECT in the last 72 hours.  No results found for: HGBA1C ------------------------------------------------------------------------------------------------------------------ No results for input(s): TSH, T4TOTAL, T3FREE, THYROIDAB in the last 72 hours.  Invalid input(s): FREET3 ------------------------------------------------------------------------------------------------------------------ No results for input(s): VITAMINB12, FOLATE, FERRITIN, TIBC, IRON, RETICCTPCT in the last 72 hours.  Coagulation profile Recent Labs  Lab 09/24/20 0747  INR 1.0    No results for input(s): DDIMER in the last 72 hours.  Cardiac Enzymes No results for input(s): CKMB, TROPONINI, MYOGLOBIN in the last 168 hours.  Invalid input(s): CK ------------------------------------------------------------------------------------------------------------------ No results found for: BNP  Roxan Hockey M.D on 09/25/2020 at 6:51 PM  Go to [email or web address] - for contact info  [facility] - Office  [phone]

## 2020-09-25 NOTE — Telephone Encounter (Signed)
See noted

## 2020-09-26 ENCOUNTER — Other Ambulatory Visit: Payer: Self-pay | Admitting: Radiology

## 2020-09-26 ENCOUNTER — Ambulatory Visit: Payer: BC Managed Care – PPO

## 2020-09-26 ENCOUNTER — Other Ambulatory Visit: Payer: Self-pay | Admitting: Urology

## 2020-09-26 ENCOUNTER — Telehealth: Payer: Self-pay

## 2020-09-26 DIAGNOSIS — N1330 Unspecified hydronephrosis: Secondary | ICD-10-CM

## 2020-09-26 DIAGNOSIS — C679 Malignant neoplasm of bladder, unspecified: Secondary | ICD-10-CM

## 2020-09-26 DIAGNOSIS — C678 Malignant neoplasm of overlapping sites of bladder: Secondary | ICD-10-CM

## 2020-09-26 LAB — BASIC METABOLIC PANEL
Anion gap: 9 (ref 5–15)
BUN: 39 mg/dL — ABNORMAL HIGH (ref 6–20)
CO2: 20 mmol/L — ABNORMAL LOW (ref 22–32)
Calcium: 8.2 mg/dL — ABNORMAL LOW (ref 8.9–10.3)
Chloride: 110 mmol/L (ref 98–111)
Creatinine, Ser: 4.01 mg/dL — ABNORMAL HIGH (ref 0.61–1.24)
GFR, Estimated: 16 mL/min — ABNORMAL LOW (ref >60)
Glucose, Bld: 107 mg/dL — ABNORMAL HIGH (ref 70–99)
Potassium: 3.8 mmol/L (ref 3.5–5.1)
Sodium: 139 mmol/L (ref 135–145)

## 2020-09-26 MED ORDER — AMLODIPINE BESYLATE 10 MG PO TABS
10.0000 mg | ORAL_TABLET | Freq: Every day | ORAL | 5 refills | Status: DC
Start: 2020-09-27 — End: 2020-09-26

## 2020-09-26 MED ORDER — AMLODIPINE BESYLATE 10 MG PO TABS
10.0000 mg | ORAL_TABLET | Freq: Every day | ORAL | 5 refills | Status: DC
Start: 2020-09-27 — End: 2020-10-18

## 2020-09-26 MED ORDER — FLUCONAZOLE 100 MG PO TABS: 100.0000 mg | ORAL_TABLET | Freq: Every day | ORAL | 0 refills | Status: DC

## 2020-09-26 NOTE — Telephone Encounter (Signed)
-----   Message from Cleon Gustin, MD sent at 09/25/2020  2:40 PM EST ----- normal ----- Message ----- From: Dorisann Frames, RN Sent: 09/25/2020  12:14 PM EST To: Cleon Gustin, MD  Please review

## 2020-09-26 NOTE — Plan of Care (Signed)
  Problem: Education: Goal: Knowledge of General Education information will improve Description Including pain rating scale, medication(s)/side effects and non-pharmacologic comfort measures Outcome: Progressing   Problem: Health Behavior/Discharge Planning: Goal: Ability to manage health-related needs will improve Outcome: Progressing   

## 2020-09-26 NOTE — Consult Note (Signed)
Urology Consult  Consulting MD: Denton Brick   CC: Bladder cancer, bilateral hydronephrosis  HPI: This is a 59year old male with history of high-grade nonmuscle invasive bladder cancer who has had several resections and BCG treatments.  His last resection was in June of this year.  He came in last week for BCG installation but this was delayed due to the presence of yeast in his urine and he was sent home on therapy for this.  He was admitted to the hospital over the weekend for intense abdominal pain that was felt to probably be of GI origin.  CT scan performed at that time revealed mild bilateral hydroureteronephrosis, bladder wall irregularity/thickening, slight bilateral pelvic lymphadenopathy.  He has had a stable creatinine, at approximately 3.  He has normal potassium.  Urologic consultation is requested for further management.  PMH: Past Medical History:  Diagnosis Date  . Cancer ([place])    bladder  . Cataract   . COVID-19 11/13/2019   h/a, chills, fatigue, loss of taste and smell, all symptoms resolved in a few weeks  . Frequent urination   . Fuchs' corneal dystrophy   . Full dentures   . GERD (gastroesophageal reflux disease)   . Hypertension   . Rheumatoid arthritis (HCC)     PSH: Past Surgical History:  Procedure Laterality Date  . CORNEAL TRANSPLANT Left 2013  . CYSTOSCOPY WITH BIOPSY N/A 09/14/2019   Procedure: CYSTOSCOPY WITH BIOPSY;  Surgeon: Franchot Gallo, MD;  Location: [facility];  Service: Urology;  Laterality: N/A;  . FULGURATION OF BLADDER TUMOR N/A 09/14/2019   Procedure: FULGURATION OF BLADDER TUMOR;  Surgeon: Franchot Gallo, MD;  Location: [facility];  Service: Urology;  Laterality: N/A;  . TRANSURETHRAL RESECTION OF BLADDER TUMOR WITH MITOMYCIN-C N/A 08/05/2019   Procedure: TRANSURETHRAL RESECTION OF BLADDER TUMOR;  Surgeon: Franchot Gallo, MD;  Location[facility];  Service: Urology;  Laterality: N/A;  1 HR  . TRANSURETHRAL RESECTION OF  BLADDER TUMOR WITH MITOMYCIN-C N/A 04/24/2020   Procedure: TRANSURETHRAL RESECTION OF BLADDER TUMOR WITH GEMCIDABINE;  Surgeon: Franchot Gallo, MD;  Location[facility];  Service: Urology;  Laterality: N/A;    Allergies: Allergies  Allergen Reactions  . Ace Inhibitors Cough    Medications: Medications Prior to Admission  Medication Sig Dispense Refill Last Dose  . acetaminophen (TYLENOL) 650 MG CR tablet Take 650 mg by mouth every 8 (eight) hours as needed for pain.   09/23/2020 at Unknown time  . chlorthalidone (HYGROTON) 25 MG tablet Take 0.5 tablets (12.5 mg total) by mouth daily. 45 tablet 1 09/23/2020 at Unknown time  . fluconazole (DIFLUCAN) 100 MG tablet Take 1 tablet (100 mg total) by mouth daily. X 7 days 5 tablet 0 09/23/2020 at Unknown time  . HUMIRA PEN 40 MG/0.4ML PNKT Inject 40 mg into the skin every 14 (fourteen) days.   09/17/2020  . leflunomide (ARAVA) 20 MG tablet Take 20 mg by mouth daily.   09/22/2020 at Unknown time  . losartan (COZAAR) 100 MG tablet Take 1 tablet (100 mg total) by mouth daily. (Patient taking differently: Take 100 mg by mouth at bedtime. ) 90 tablet 1 09/22/2020 at Unknown time  . omeprazole (PRILOSEC) 40 MG capsule Take 40 mg by mouth daily.   09/23/2020 at Unknown time  . pravastatin (PRAVACHOL) 40 MG tablet Take 1 tablet (40 mg total) by mouth daily. (Patient taking differently: Take 80 mg by mouth daily. ) 90 tablet 1 09/22/2020 at Unknown time  .  solifenacin (VESICARE) 10 MG tablet Take 1 tablet (10 mg total) by mouth daily. 90 tablet 3 09/22/2020 at Unknown time  . tamsulosin (FLOMAX) 0.4 MG CAPS capsule Take 2 capsules (0.8 mg total) by mouth daily. (Patient taking differently: Take 0.8 mg by mouth at bedtime. ) 180 capsule 1 09/22/2020 at Unknown time  . prednisoLONE acetate (PRED FORTE) 1 % ophthalmic suspension Place 1 drop into both eyes 2 (two) times a week. Instill 1 drop into both eyes Mondays and Friday.   09/22/2020  .  sulfamethoxazole-trimethoprim (BACTRIM DS) 800-160 MG tablet Take 1 tablet by mouth 2 (two) times daily. (Patient not taking: Reported on 09/23/2020) 6 tablet 0 Not Taking at Unknown time  . traMADol (ULTRAM) 50 MG tablet Take 1 tablet (50 mg total) by mouth every 6 (six) hours as needed. (Patient not taking: Reported on 09/23/2020) 10 tablet 0 Not Taking at Unknown time     Social History: Social History   Socioeconomic History  . Marital status: Married    Spouse name: Not on file  . Number of children: Not on file  . Years of education: Not on file  . Highest education level: Not on file  Occupational History  . Not on file  Tobacco Use  . Smoking status: Former Smoker    Packs/day: 0.50    Years: 20.00    Pack years: 10.00    Types: Cigarettes  . Smokeless tobacco: Never Used  . Tobacco comment: quit 2010  Vaping Use  . Vaping Use: Never used  Substance and Sexual Activity  . Alcohol use: Yes    Comment: occ  2 x month  . Drug use: No  . Sexual activity: Yes  Other Topics Concern  . Not on file  Social History Narrative  . Not on file   Social Determinants of Health   Financial Resource Strain:   . Difficulty of Paying Living Expenses: Not on file  Food Insecurity:   . Worried About Charity fundraiser in the Last Year: Not on file  . Ran Out of Food in the Last Year: Not on file  Transportation Needs:   . Lack of Transportation (Medical): Not on file  . Lack of Transportation (Non-Medical): Not on file  Physical Activity:   . Days of Exercise per Week: Not on file  . Minutes of Exercise per Session: Not on file  Stress:   . Feeling of Stress : Not on file  Social Connections:   . Frequency of Communication with Friends and Family: Not on file  . Frequency of Social Gatherings with Friends and Family: Not on file  . Attends Religious Services: Not on file  . Active Member of Clubs or Organizations: Not on file  . Attends Archivist Meetings: Not on  file  . Marital Status: Not on file  Intimate Partner Violence:   . Fear of Current or Ex-Partner: Not on file  . Emotionally Abused: Not on file  . Physically Abused: Not on file  . Sexually Abused: Not on file    Family History: History reviewed. No pertinent family history.  Review of Systems: Positive: Lower urinary tract symptoms.  No significant hematuria recently. Negative:   A further 10 point review of systems was negative except what is listed in the HPI.  Physical Exam: @VITALS2 @ General: No acute distress.  Awake. Head:  Normocephalic.  Atraumatic. ENT:  EOMI.  Mucous membranes moist Neck:  Supple.  No lymphadenopathy. CV:  Regular  rate. Pulmonary: Equal effort bilaterally.     Studies:  Recent Labs    09/24/20 0747 09/25/20 0726  HGB 12.8* 11.8*  WBC 8.7 7.5  PLT 196 191    Recent Labs    09/24/20 0747 09/25/20 0726  NA 142 141  K 3.6 3.7  CL 108 111  CO2 25 22  BUN 35* 33*  CREATININE 2.88* 3.15*  CALCIUM 8.2* 8.0*  GFRNONAA 25* 22*     Recent Labs    09/24/20 0747  INR 1.0  APTT 28     Invalid input(s): ABG  I independently reviewed the patient's CT images.  Also, these were reviewed with both the patient and his wife.  Assessment: High-grade nonmuscle invasive bladder cancer.  This may well be progressing.  The patient does have bilateral hydronephrosis which may be secondary to prior resection scarring or progression of his bladder cancer.  There is mild lymphadenopathy bilaterally.  Plan: I have discussed both urgent and longer-term management with the patient and his wife.  Certainly, at this point I think he needs bilateral nephrostomy tubes which may be internalized eventually to double-J stents.  I have spoken with interventional radiology at [facility] long.  They can have this done on Thursday, 2 days from now.  I think it certainly okay to let the patient go home and have this done as an outpatient, as his creatinine is stable and  certainly he does want to go.  Eventually, I think he needs to have a consultation regarding cystectomy with possible neoadjuvant chemotherapy before that.I will set up an appointment for him to see Dr. Tresa Moore in our office to discuss this.  I have taken the liberty to stop his heparin at this point, and I certainly think it is okay for him to go home.  Interventional radiology will call him to discuss his appointment on Thursday.    Pager:[phone]

## 2020-09-26 NOTE — Telephone Encounter (Signed)
Patient currently admitted at [facility]. Dr. Diona Fanti went to hospital today and saw the patient.

## 2020-09-26 NOTE — Discharge Instructions (Signed)
1)Avoid ibuprofen/Advil/Aleve/Motrin/Goody Powders/Naproxen/BC powders/Meloxicam/Diclofenac/Indomethacin and other Nonsteroidal anti-inflammatory medications as these will make you more likely to bleed and can cause stomach ulcers, can also cause Kidney problems.   2) please go to [facility] tomorrow 09/27/20 --. You need to arrive admitting 2pm.  Nothing to eat or drink after 9 AM tomorrow 09/27/2020--- you are to remain fasting for your interventional radiology procedure time at 330pm (bilateral nephrostomy tube procedures)  3) follow-up with your urologist Dr. Diona Fanti as previously advised by him  4) please note that there has been some changes to your blood pressure medications due to concerns about your kidney function --- Stop losartan and chlorthalidone due to kidney concerns, take amlodipine 10 mg instead for blood pressure control  5) you need a repeat CBC and a BMP blood test with your primary care physician within 3 to 5 days  6) Please drink enough fluids and avoid dehydration

## 2020-09-26 NOTE — Discharge Summary (Signed)
Barry Gibson, is a 59 y.o. male  DOB 1961-04-06  MRN [id number].  Admission date:  09/23/2020  Admitting Physician  [name], DO  Discharge Date:  09/26/2020   Primary MD  [name], DO  Recommendations for primary care physician for things to follow:   1)Avoid ibuprofen/Advil/Aleve/Motrin/Goody Powders/Naproxen/BC powders/Meloxicam/Diclofenac/Indomethacin and other Nonsteroidal anti-inflammatory medications as these will make you more likely to bleed and can cause stomach ulcers, can also cause Kidney problems.   2) please go to [facility] tomorrow 09/27/20 --. You need to arrive admitting 2pm.  Nothing to eat or drink after 9 AM tomorrow 09/27/2020--- you are to remain fasting for your interventional radiology procedure time at 330pm (bilateral nephrostomy tube procedures)  3) follow-up with your urologist Dr. Diona Fanti as previously advised by him  4) please note that there has been some changes to your blood pressure medications due to concerns about your kidney function --- Stop losartan and chlorthalidone due to kidney concerns, take amlodipine 10 mg instead for blood pressure control  5) you need a repeat CBC and a BMP blood test with your primary care physician within 3 to 5 days  6) Please drink enough fluids and avoid dehydration  Admission Diagnosis  AKI (acute kidney injury) ([place]) [N17.9]   Discharge Diagnosis  AKI (acute kidney injury) ([place]) [N17.9]    Principal Problem:   AKI (acute kidney injury) ([place]) Active Problems:   Hyperlipidemia   Essential hypertension, benign   Rheumatoid arthritis ([place])   Bladder cancer ([place])   Abdominal pain   History of peptic ulcer   Acute lower UTI      Past Medical History:  Diagnosis Date  . Cancer ([place])    bladder  . Cataract   . COVID-19 11/13/2019   h/a, chills, fatigue, loss of taste and smell, all symptoms  resolved in a few weeks  . Frequent urination   . Fuchs' corneal dystrophy   . Full dentures   . GERD (gastroesophageal reflux disease)   . Hypertension   . Rheumatoid arthritis [facility])     Past Surgical History:  Procedure Laterality Date  . CORNEAL TRANSPLANT Left 2013  . CYSTOSCOPY WITH BIOPSY N/A 09/14/2019   Procedure: CYSTOSCOPY WITH BIOPSY;  Surgeon: Franchot Gallo, MD;  Location: [facility];  Service: Urology;  Laterality: N/A;  . FULGURATION OF BLADDER TUMOR N/A 09/14/2019   Procedure: FULGURATION OF BLADDER TUMOR;  Surgeon: Franchot Gallo, MD;  Location: [facility];  Service: Urology;  Laterality: N/A;  . TRANSURETHRAL RESECTION OF BLADDER TUMOR WITH MITOMYCIN-C N/A 08/05/2019   Procedure: TRANSURETHRAL RESECTION OF BLADDER TUMOR;  Surgeon: Franchot Gallo, MD;  Location: [facility];  Service: Urology;  Laterality: N/A;  1 HR  . TRANSURETHRAL RESECTION OF BLADDER TUMOR WITH MITOMYCIN-C N/A 04/24/2020   Procedure: TRANSURETHRAL RESECTION OF BLADDER TUMOR WITH GEMCIDABINE;  Surgeon: Franchot Gallo, MD;  Location: [facility];  Service: Urology;  Laterality: N/A;       HPI  from the history  and physical done on the day of admission:    Chief Complaint: Abdominal pain  HPI: Barry Gibson is a 59 y.o. male with medical history significant for GERD, peptic ulcer, malignant neoplasm of the bladder S/P TURBT (initial resection-9/20; subsequent resection 04/24/2020 by Dr.Dahlstedt) with postop intravesical chemotherapy (gemcitabine).  Patient followed up with his urologist on 11/5 for urinalysis and was noted to have yeast infection, so he was prescribed with fluconazole, patient states that he took the medication on Thursday (11/5) and took the morning dose yesterday (11/6), however, in the afternoon around 4 PM, he complained of severe intermittent abdominal pain in the epigastric area and states that pain was similar to when he was diagnosed to have  peptic ulcer.  He denies, fever, chills, chest pain, shortness of breath, diarrhea or constipation.   ED Course:  In the emergency department, BP was 160/100, otherwise he was hemodynamically stable.  Work-up in the ED showed normal CBC and elevated BUN to creatinine at 41/3.03 (baseline creatinine 1.0-1.1; this was 1.8, 2 months ago).  Urinalysis was negative.  CT abdomen and pelvis without contrast showed mild bilateral hydronephrosis suspected to be due to cystitis or recurrent bladder neoplasm; Diffuse bladder wall thickening with slight nodularity at the inferior portion with significant surrounding inflammatory changes.  Urologist on call (Dr. Junious Silk) was consulted and said that patient can be admitted to [facility] and that there was no need for a urology consult at this time.  Hospitalist was asked to admit patient for further evaluation and management.     Hospital Course:      Brief Summary:- 59 y.o.malewith medical history significant forGERD, peptic ulcer, malignant neoplasm of the bladder S/P TURBT(initial resection-9/20;subsequent resection 04/24/2020 by Dr.Dahlstedt)with postop intravesical chemotherapy (gemcitabine) recently treated for yeast in the urine with Diflucan presents to the ED with nausea/dry heaving and found to have acute kidney injury with creatinine up to 3 from a baseline usually around 1  A/p  1)AKI----acute kidney injury due to dehydration in setting of nausea dry heaving, poor oral intake--compounded by losartan and chlorthalidone use ----   Creatinine on admission= 3.0,  --, baseline creatinine = 1.10 (June 2021)   ,  creatinine is now= 4 (peak )  ,  -renally adjust medications, avoid nephrotoxic agents / dehydration  / hypotension STOP chlorthalidone and losartan -CT abdomen and renal ultrasound consistent with obstructive uropathy with bilateral hydronephrosis in the setting of previously known bladder mass -Discussed with patient's urologist Dr.  Lenon Ahmadi to IR to place bilateral nephrostomy tubes on 09/27/20 at [facility] at 1530 pm -consult from Dr. Diona Fanti noted  2) acute anemia--hemoglobin is down to 12.8, no prior history of anemia suspect this is due to IV fluids/hemodilution  3) history of bladder Mass/BPH--currently getting BCG injections, follows with urologist Dr. Diona Fanti, continue Flomax -Obstructive uropathy noted on imaging studies--please see #1 above  4)RA--- denies significant arthralgia at this time, patient is on leflunomide  5)HTN--STOP  chlorthalidone and losartan due to significant AKI, , start Amlodipine   6) yeast UTI--- previously treated with Diflucan, patient refusing additional doses of Diflucan , advised to complete course  7)GERD/PUD--continue Protonix, okay to use Tums  Disposition- dc home   Disposition: The patient is from: Home  Anticipated d/c is to: Home    Code Status : full  Family Communication:  (patient is alert, awake and coherent)  -Discussed with wife at bedside  Consults  :  Urology/IR   Discharge Condition: stable  Follow UP--- IR  at [facility] for Bil Nephro tubes on 09/27/20 at 330 PM  Diet and Activity recommendation:  As advised  Discharge Instructions    Discharge Instructions    Call MD for:  persistant dizziness or light-headedness   Complete by: As directed    Call MD for:  persistant nausea and vomiting   Complete by: As directed    Call MD for:  severe uncontrolled pain   Complete by: As directed    Call MD for:  temperature >100.4   Complete by: As directed    Diet - low sodium heart healthy   Complete by: As directed    Discharge instructions   Complete by: As directed    1)Avoid ibuprofen/Advil/Aleve/Motrin/Goody Powders/Naproxen/BC powders/Meloxicam/Diclofenac/Indomethacin and other Nonsteroidal anti-inflammatory medications as these will make you more likely to bleed and can cause stomach ulcers, can also cause  Kidney problems.   2) please go to [facility] tomorrow 09/27/20 --. You need to arrive admitting 2pm.  Nothing to eat or drink after 9 AM tomorrow 09/27/2020--- you are to remain fasting for your interventional radiology procedure time at 330pm (bilateral nephrostomy tube procedures)  3) follow-up with your urologist Dr. Diona Fanti as previously advised by him  4) please note that there has been some changes to your blood pressure medications due to concerns about your kidney function --- Stop losartan and chlorthalidone due to kidney concerns, take amlodipine 10 mg instead for blood pressure control  5) you need a repeat CBC and a BMP blood test with your primary care physician within 3 to 5 days  6) Please drink enough fluids and avoid dehydration   Increase activity slowly   Complete by: As directed         Discharge Medications     Allergies as of 09/26/2020      Reactions   Ace Inhibitors Cough      Medication List    STOP taking these medications   chlorthalidone 25 MG tablet Commonly known as: HYGROTON   losartan 100 MG tablet Commonly known as: COZAAR   sulfamethoxazole-trimethoprim 800-160 MG tablet Commonly known as: BACTRIM DS   traMADol 50 MG tablet Commonly known as: Ultram     TAKE these medications   acetaminophen 650 MG CR tablet Commonly known as: TYLENOL Take 650 mg by mouth every 8 (eight) hours as needed for pain.   amLODipine 10 MG tablet Commonly known as: NORVASC Take 1 tablet (10 mg total) by mouth daily. Start taking on: September 27, 2020   fluconazole 100 MG tablet Commonly known as: DIFLUCAN Take 1 tablet (100 mg total) by mouth daily. What changed: additional instructions   Humira Pen 40 MG/0.4ML Pnkt Generic drug: Adalimumab Inject 40 mg into the skin every 14 (fourteen) days.   leflunomide 20 MG tablet Commonly known as: ARAVA Take 20 mg by mouth daily.   omeprazole 40 MG capsule Commonly known as: PRILOSEC Take  40 mg by mouth daily.   pravastatin 40 MG tablet Commonly known as: PRAVACHOL Take 1 tablet (40 mg total) by mouth daily. What changed: how much to take   prednisoLONE acetate 1 % ophthalmic suspension Commonly known as: PRED FORTE Place 1 drop into both eyes 2 (two) times a week. Instill 1 drop into both eyes Mondays and Friday.   solifenacin 10 MG tablet Commonly known as: VESICARE Take 1 tablet (10 mg total) by mouth daily.   tamsulosin 0.4 MG Caps capsule Commonly known as: FLOMAX Take 2 capsules (0.8 mg  total) by mouth daily. What changed: when to take this       Major procedures and Radiology Reports - PLEASE review detailed and final reports for all details, in brief -  CT ABDOMEN PELVIS WO CONTRAST  Result Date: 09/23/2020 CLINICAL DATA:  Abdominal pain and fever, history of bladder cancer EXAM: CT ABDOMEN AND PELVIS WITHOUT CONTRAST TECHNIQUE: Multidetector CT imaging of the abdomen and pelvis was performed following the standard protocol without IV contrast. COMPARISON:  July 03, 2020 FINDINGS: Lower chest: The visualized heart size within normal limits. No pericardial fluid/thickening. No hiatal hernia. The visualized portions of the lungs are clear. Hepatobiliary: Although limited due to the lack of intravenous contrast, normal in appearance without gross focal abnormality. No evidence of calcified gallstones or biliary ductal dilatation. Pancreas:  Unremarkable.  No surrounding inflammatory changes. Spleen: Normal in size. Although limited due to the lack of intravenous contrast, normal in appearance. Adrenals/Urinary Tract: Both adrenal glands appear normal. Mild bilateral pelvicaliectasis and ureterectasis is seen down to the level of the UVJ with mild perinephric and periureteral stranding changes. There is diffuse bladder wall thickening with asymmetric wall thickening/debris seen in the inferior portion. There is significant surrounding inflammatory changes.  Stomach/Bowel: The stomach, small bowel, and colon are normal in appearance. No inflammatory changes or obstructive findings. Scattered colonic diverticula are noted. The appendix is unremarkable. Vascular/Lymphatic: There appears to be slight interval enlargement of the right pelvic sidewall lymph node measuring 1.2 cm and previously 0.7 cm, best seen on series 2, image 74. There is also slight interval enlargement of the right retroperitoneal lymph node overlying the psoas musculature measuring 1.1 cm, series 2, image 65. scattered aortic atherosclerotic calcifications are seen without aneurysmal dilatation. Reproductive: The prostate is unremarkable. Other: No evidence of abdominal wall mass or hernia. Musculoskeletal: No acute or significant osseous findings. IMPRESSION: 1. Diffuse bladder wall thickening with slight nodularity at the inferior portion with significant surrounding inflammatory changes. There is also mild bilateral hydronephrosis. This could be due to cystitis or recurrent bladder neoplasm and would recommend direct visualization. 2. Slight interval enlargement in right pelvic sidewall and retroperitoneal adenopathy, which could be due to reactive adenopathy versus metastatic disease. 3.  Aortic Atherosclerosis (ICD10-I70.0). Electronically Signed   By: Prudencio Pair M.D.   On: 09/23/2020 22:12   US RENAL  Result Date: 09/25/2020 CLINICAL DATA:  Acute kidney injury. Reported history of bladder carcinoma EXAM: RENAL / URINARY TRACT ULTRASOUND COMPLETE COMPARISON:  CT abdomen and pelvis September 23, 2020. FINDINGS: Right Kidney: Renal measurements: 13.4 x 7.7 x 5.9 cm = volume: 322 mL. Echogenicity is mildly increased. Renal cortical thickness is normal. No mass or perinephric fluid visualized. There is moderate hydronephrosis on the right. No sonographically demonstrable calculus or appreciable ureterectasis. Left Kidney: Renal measurements: 13.6 x 7.6 x 5.4 cm = volume: 294 mL. Echogenicity is  mildly increased. Renal cortical thickness is normal. No mass or perinephric fluid visualized. There is moderate hydronephrosis on the left. No sonographically demonstrable calculus or ureterectasis. Bladder: There is marked thickening of the wall of the urinary bladder, consistent with findings on recent CT. A mass arising from the posterior bladder is questioned. Other: None. IMPRESSION: 1. Persistent moderate hydronephrosis bilaterally. Wall thickening involving the posterior bladder with questionable mass in this area may be causing impression on distal ureters and resulting in the hydronephrosis. This circumstance may warrant direct visualization of the bladder with retrograde assessment of the ureters and collecting systems. 2. The echogenicity of each  kidney is increased which may be indicative of a degree of underlying medical renal disease. Renal cortical thickness bilaterally is within normal limits. These results will be called to the ordering clinician or representative by the Radiologist Assistant, and communication documented in the PACS or [employer]. Electronically Signed   By: Lowella Grip III M.D.   On: 09/25/2020 12:30    Micro Results   Recent Results (from the past 240 hour(s))  Microscopic Examination     Status: Abnormal   Collection Time: 09/22/20  3:21 PM   Urine  Result Value Ref Range Status   WBC, UA 6-10 (A) 0 - 5 /hpf Final   RBC >30 (A) 0 - 2 /hpf Final   Epithelial Cells (non renal) None seen 0 - 10 /hpf Final   Renal Epithel, UA None seen None seen /hpf Final   Bacteria, UA Few (A) None seen/Few Final   Yeast, UA Present None seen Final  Urine Culture     Status: None   Collection Time: 09/22/20  3:59 PM   Specimen: Urine   Urine  Result Value Ref Range Status   Urine Culture, Routine Final report  Final   Organism ID, Bacteria No growth  Final  Respiratory Panel by RT PCR (Flu A&B, Covid) - Nasopharyngeal Swab     Status: None   Collection Time:  09/23/20 10:50 PM   Specimen: Nasopharyngeal Swab  Result Value Ref Range Status   SARS Coronavirus 2 by RT PCR NEGATIVE NEGATIVE Final    Comment: (NOTE) SARS-CoV-2 target nucleic acids are NOT DETECTED.  The SARS-CoV-2 RNA is generally detectable in upper respiratoy specimens during the acute phase of infection. The lowest concentration of SARS-CoV-2 viral copies this assay can detect is 131 copies/mL. A negative result does not preclude SARS-Cov-2 infection and should not be used as the sole basis for treatment or other patient management decisions. A negative result may occur with  improper specimen collection/handling, submission of specimen other than nasopharyngeal swab, presence of viral mutation(s) within the areas targeted by this assay, and inadequate number of viral copies (<131 copies/mL). A negative result must be combined with clinical observations, patient history, and epidemiological information. The expected result is Negative.  Fact Sheet for Patients:  PinkCheek.be  Fact Sheet for Healthcare Providers:  GravelBags.it  This test is no t yet approved or cleared by the [place] FDA and  has been authorized for detection and/or diagnosis of SARS-CoV-2 by FDA under an Emergency Use Authorization (EUA). This EUA will remain  in effect (meaning this test can be used) for the duration of the COVID-19 declaration under Section 564(b)(1) of the Act, 21 U.S.C. section 360bbb-3(b)(1), unless the authorization is terminated or revoked sooner.     Influenza A by PCR NEGATIVE NEGATIVE Final   Influenza B by PCR NEGATIVE NEGATIVE Final    Comment: (NOTE) The Xpert Xpress SARS-CoV-2/FLU/RSV assay is intended as an aid in  the diagnosis of influenza from Nasopharyngeal swab specimens and  should not be used as a sole basis for treatment. Nasal washings and  aspirates are unacceptable for Xpert Xpress  SARS-CoV-2/FLU/RSV  testing.  Fact Sheet for Patients: PinkCheek.be  Fact Sheet for Healthcare Providers: GravelBags.it  This test is not yet approved or cleared by the [place] FDA and  has been authorized for detection and/or diagnosis of SARS-CoV-2 by  FDA under an Emergency Use Authorization (EUA). This EUA will remain  in effect (meaning this test can be used)  for the duration of the  Covid-19 declaration under Section 564(b)(1) of the Act, 21  U.S.C. section 360bbb-3(b)(1), unless the authorization is  terminated or revoked. Performed at [facility], [address], [place], [place] [address]        Today   Subjective    Brewer Hitchman today has no new complaints Wife at bedside Eager to go home No fever  Or chills   No Nausea, Vomiting or Diarrhea          Patient has been seen and examined prior to discharge   Objective   Blood pressure (!) 159/96, pulse 84, temperature 98.9 F (37.2 C), temperature source Oral, resp. rate 20, height 5\' 11"  (1.803 m), weight 104.3 kg, SpO2 92 %.   Intake/Output Summary (Last 24 hours) at 09/26/2020 1123 Last data filed at 09/25/2020 1900 Gross per 24 hour  Intake 720 ml  Output --  Net 720 ml    Exam Gen:- Awake Alert,  In no apparent distress  HEENT:- [place].AT, No sclera icterus Neck-Supple Neck,No JVD,.  Lungs-  CTAB , fair symmetrical air movement CV- S1, S2 normal, regular  Abd-  +ve B.Sounds, Abd Soft, No tenderness, no CVA area tenderness Extremity/Skin:- No  edema, pedal pulses present  Psych-affect is appropriate, oriented x3 Neuro-no new focal deficits, no tremors   Data Review   CBC w Diff:  Lab Results  Component Value Date   WBC 7.5 09/25/2020   HGB 11.8 (L) 09/25/2020   HGB 15.4 11/03/2019   HCT 37.0 (L) 09/25/2020   HCT 45.9 11/03/2019   PLT 191 09/25/2020   PLT 202 11/03/2019   LYMPHOPCT 14 09/23/2020   MONOPCT 11 09/23/2020    EOSPCT 1 09/23/2020   BASOPCT 1 09/23/2020    CMP:  Lab Results  Component Value Date   NA 139 09/26/2020   NA 143 11/03/2019   K 3.8 09/26/2020   CL 110 09/26/2020   CO2 20 (L) 09/26/2020   BUN 39 (H) 09/26/2020   BUN 23 11/03/2019   CREATININE 4.01 (H) 09/26/2020   CREATININE 1.05 11/25/2014   PROT 6.5 09/24/2020   PROT 6.2 11/03/2019   ALBUMIN 3.5 09/24/2020   ALBUMIN 4.0 11/03/2019   BILITOT 0.8 09/24/2020   BILITOT 0.6 11/03/2019   ALKPHOS 44 09/24/2020   AST 97 (H) 09/24/2020   ALT 35 09/24/2020  .   Total Discharge time is about 33 minutes  Roxan Hockey M.D on 09/26/2020 at 11:23 AM  Go to [email or web address] -  for contact info  [facility] - Office  [phone]

## 2020-09-27 ENCOUNTER — Other Ambulatory Visit: Payer: Self-pay

## 2020-09-27 ENCOUNTER — Other Ambulatory Visit (HOSPITAL_COMMUNITY): Payer: Self-pay | Admitting: Urology

## 2020-09-27 ENCOUNTER — Other Ambulatory Visit: Payer: Self-pay | Admitting: *Deleted

## 2020-09-27 ENCOUNTER — Ambulatory Visit (HOSPITAL_COMMUNITY)
Admission: RE | Admit: 2020-09-27 | Discharge: 2020-09-27 | Disposition: A | Discharge status: Home / Self Care | Payer: BC Managed Care – PPO | Source: Ambulatory Visit | Attending: Family Medicine | Admitting: Family Medicine

## 2020-09-27 ENCOUNTER — Encounter (HOSPITAL_COMMUNITY): Payer: Self-pay

## 2020-09-27 ENCOUNTER — Inpatient Hospital Stay (HOSPITAL_COMMUNITY): Admit: 2020-09-27 | Payer: BC Managed Care – PPO

## 2020-09-27 ENCOUNTER — Ambulatory Visit (HOSPITAL_COMMUNITY)
Admission: RE | Admit: 2020-09-27 | Discharge: 2020-09-27 | Disposition: A | Discharge status: Home / Self Care | Payer: BC Managed Care – PPO | Source: Ambulatory Visit | Attending: Urology | Admitting: Urology

## 2020-09-27 DIAGNOSIS — E785 Hyperlipidemia, unspecified: Secondary | ICD-10-CM

## 2020-09-27 DIAGNOSIS — N1339 Other hydronephrosis: Secondary | ICD-10-CM | POA: Insufficient documentation

## 2020-09-27 DIAGNOSIS — Z79899 Other long term (current) drug therapy: Secondary | ICD-10-CM

## 2020-09-27 DIAGNOSIS — R7301 Impaired fasting glucose: Secondary | ICD-10-CM

## 2020-09-27 DIAGNOSIS — Z125 Encounter for screening for malignant neoplasm of prostate: Secondary | ICD-10-CM

## 2020-09-27 DIAGNOSIS — I10 Essential (primary) hypertension: Secondary | ICD-10-CM

## 2020-09-27 HISTORY — PX: IR NEPHROSTOMY PLACEMENT LEFT: IMG6063

## 2020-09-27 HISTORY — PX: IR NEPHROSTOMY PLACEMENT RIGHT: IMG6064

## 2020-09-27 LAB — CBC WITH DIFFERENTIAL/PLATELET
Abs Immature Granulocytes: 0.03 10*3/uL (ref 0.00–0.07)
Basophils Absolute: 0.1 10*3/uL (ref 0.0–0.1)
Basophils Relative: 1 %
Eosinophils Absolute: 0.2 10*3/uL (ref 0.0–0.5)
Eosinophils Relative: 3 %
HCT: 38.5 % — ABNORMAL LOW (ref 39.0–52.0)
Hemoglobin: 12.7 g/dL — ABNORMAL LOW (ref 13.0–17.0)
Immature Granulocytes: 0 %
Lymphocytes Relative: 32 %
Lymphs Abs: 2.2 10*3/uL (ref 0.7–4.0)
MCH: 30.1 pg (ref 26.0–34.0)
MCHC: 33 g/dL (ref 30.0–36.0)
MCV: 91.2 fL (ref 80.0–100.0)
Monocytes Absolute: 0.9 10*3/uL (ref 0.1–1.0)
Monocytes Relative: 13 %
Neutro Abs: 3.5 10*3/uL (ref 1.7–7.7)
Neutrophils Relative %: 51 %
Platelets: 222 10*3/uL (ref 150–400)
RBC: 4.22 MIL/uL (ref 4.22–5.81)
RDW: 13.8 % (ref 11.5–15.5)
WBC: 6.9 10*3/uL (ref 4.0–10.5)
nRBC: 0 % (ref 0.0–0.2)

## 2020-09-27 LAB — BASIC METABOLIC PANEL
Anion gap: 12 (ref 5–15)
BUN: 44 mg/dL — ABNORMAL HIGH (ref 6–20)
CO2: 19 mmol/L — ABNORMAL LOW (ref 22–32)
Calcium: 8.2 mg/dL — ABNORMAL LOW (ref 8.9–10.3)
Chloride: 110 mmol/L (ref 98–111)
Creatinine, Ser: 4.82 mg/dL — ABNORMAL HIGH (ref 0.61–1.24)
GFR, Estimated: 13 mL/min — ABNORMAL LOW (ref >60)
Glucose, Bld: 95 mg/dL (ref 70–99)
Potassium: 3.9 mmol/L (ref 3.5–5.1)
Sodium: 141 mmol/L (ref 135–145)

## 2020-09-27 MED ORDER — LIDOCAINE HCL (PF) 1 % IJ SOLN
INTRAMUSCULAR | Status: AC | PRN
  Administered 2020-09-27 (×2): 5 mL

## 2020-09-27 MED ORDER — IOHEXOL 300 MG/ML  SOLN
50.0000 mL | Freq: Once | INTRAMUSCULAR | Status: AC | PRN
  Administered 2020-09-27: 30 mL

## 2020-09-27 MED ORDER — FENTANYL CITRATE (PF) 100 MCG/2ML IJ SOLN
INTRAMUSCULAR | Status: AC | PRN
Start: 2020-09-27 — End: 2020-09-27
  Administered 2020-09-27 (×4): 50 ug via INTRAVENOUS

## 2020-09-27 MED ORDER — CEFAZOLIN SODIUM-DEXTROSE 2-4 GM/100ML-% IV SOLN
INTRAVENOUS | Status: AC
  Administered 2020-09-27: 2 g via INTRAVENOUS
  Filled 2020-09-27: qty 100

## 2020-09-27 MED ORDER — LIDOCAINE HCL 1 % IJ SOLN
INTRAMUSCULAR | Status: AC
  Filled 2020-09-27: qty 20

## 2020-09-27 MED ORDER — SODIUM CHLORIDE 0.9 % IV SOLN: INTRAVENOUS | Status: DC

## 2020-09-27 MED ORDER — MIDAZOLAM HCL 2 MG/2ML IJ SOLN
INTRAMUSCULAR | Status: AC
  Filled 2020-09-27: qty 4

## 2020-09-27 MED ORDER — CEFAZOLIN SODIUM-DEXTROSE 2-4 GM/100ML-% IV SOLN: 2.0000 g | INTRAVENOUS | Status: AC

## 2020-09-27 MED ORDER — SODIUM CHLORIDE 0.9% FLUSH: 5.0000 mL | Freq: Three times a day (TID) | INTRAVENOUS | Status: DC

## 2020-09-27 MED ORDER — FENTANYL CITRATE (PF) 100 MCG/2ML IJ SOLN
INTRAMUSCULAR | Status: AC
  Filled 2020-09-27: qty 4

## 2020-09-27 MED ORDER — MIDAZOLAM HCL 2 MG/2ML IJ SOLN
INTRAMUSCULAR | Status: AC
  Filled 2020-09-27: qty 2

## 2020-09-27 MED ORDER — MIDAZOLAM HCL 2 MG/2ML IJ SOLN
INTRAMUSCULAR | Status: AC | PRN
  Administered 2020-09-27 (×4): 1 mg via INTRAVENOUS

## 2020-09-27 NOTE — Procedures (Signed)
Pre Procedure Dx: Bilateral Hydronephrosis Post Procedural Dx: Same  Successful [place] and fluoroscopic guided placement of bilateral PCNs with ends coiled and locked within the bilateral renal pelves. Both PCNs connected to gravity bags.  EBL: Minimal Complications: None immediate.  Ronny Bacon, MD Pager #: [phone]

## 2020-09-27 NOTE — Telephone Encounter (Signed)
Yes pls order those and cbc. Thx. D.r Letrell Attwood

## 2020-09-27 NOTE — Consult Note (Signed)
Chief Complaint: Patient was seen in consultation today for bilateral percutaneous nephrostomies  Referring Physician(s): [place]  Supervising Physician: Sandi Mariscal  Patient Status: [place]  History of Present Illness: Barry Gibson is a 59 y.o. male past medical history of COVID-19 in December of last year, Fuchs corneal dystrophy, renal insufficiency, GERD, hypertension, rheumatoid arthritis, peptic ulcer, bladder cancer with prior TURBT in September 2020 as well as an June 2021 with postop intravesical chemotherapy.  He was recently treated for yeast UTI.  He was discharged from [facility] on 09/26/20  following admission for abdominal pain (? reaction to diflucan).  CT scan performed at that time revealed mild bilateral hydroureteronephrosis, bladder wall irregularity/thickening, slight lateral pelvic lymphadenopathy.  Follow-up renal ultrasound revealed persistent moderate hydronephrosis bilaterally with wall thickening involving the posterior bladder with questionable mass in the area which may be causing compression of the distal ureters resulting in hydronephrosis.  Creatinine on day of discharge was 4.1.  He was evaluated by Dr. Diona Fanti and request now received for bilateral percutaneous nephrostomies.  Past Medical History:  Diagnosis Date  . Cancer ([place])    bladder  . Cataract   . COVID-19 11/13/2019   h/a, chills, fatigue, loss of taste and smell, all symptoms resolved in a few weeks  . Frequent urination   . Fuchs' corneal dystrophy   . Full dentures   . GERD (gastroesophageal reflux disease)   . Hypertension   . Rheumatoid arthritis [facility])     Past Surgical History:  Procedure Laterality Date  . CORNEAL TRANSPLANT Left 2013  . CYSTOSCOPY WITH BIOPSY N/A 09/14/2019   Procedure: CYSTOSCOPY WITH BIOPSY;  Surgeon: Franchot Gallo, MD;  Location: [facility];  Service: Urology;  Laterality: N/A;  . FULGURATION OF BLADDER TUMOR N/A 09/14/2019     Procedure: FULGURATION OF BLADDER TUMOR;  Surgeon: Franchot Gallo, MD;  Location: [facility];  Service: Urology;  Laterality: N/A;  . TRANSURETHRAL RESECTION OF BLADDER TUMOR WITH MITOMYCIN-C N/A 08/05/2019   Procedure: TRANSURETHRAL RESECTION OF BLADDER TUMOR;  Surgeon: Franchot Gallo, MD;  Location: [facility];  Service: Urology;  Laterality: N/A;  1 HR  . TRANSURETHRAL RESECTION OF BLADDER TUMOR WITH MITOMYCIN-C N/A 04/24/2020   Procedure: TRANSURETHRAL RESECTION OF BLADDER TUMOR WITH GEMCIDABINE;  Surgeon: Franchot Gallo, MD;  Location: [facility];  Service: Urology;  Laterality: N/A;    Allergies: Ace inhibitors  Medications: Prior to Admission medications   Medication Sig Start Date End Date Taking? Authorizing Provider  acetaminophen (TYLENOL) 650 MG CR tablet Take 650 mg by mouth every 8 (eight) hours as needed for pain.    [provider]  amLODipine (NORVASC) 10 MG tablet Take 1 tablet (10 mg total) by mouth daily. For BP 09/27/20   Emokpae, Courage, MD  fluconazole (DIFLUCAN) 100 MG tablet Take 1 tablet (100 mg total) by mouth daily. 09/26/20   Emokpae, Courage, MD  HUMIRA PEN 40 MG/0.4ML PNKT Inject 40 mg into the skin every 14 (fourteen) days. 07/26/19   [provider]  leflunomide (ARAVA) 20 MG tablet Take 20 mg by mouth daily. 07/27/19   [provider]  omeprazole (PRILOSEC) 40 MG capsule Take 40 mg by mouth daily.    [provider]  pravastatin (PRAVACHOL) 40 MG tablet Take 1 tablet (40 mg total) by mouth daily. Patient taking differently: Take 80 mg by mouth daily.  04/04/20   Mikey Kirschner, MD  prednisoLONE acetate (PRED FORTE) 1 % ophthalmic  suspension Place 1 drop into both eyes 2 (two) times a week. Instill 1 drop into both eyes Mondays and Friday.    [provider]  solifenacin (VESICARE) 10 MG tablet Take 1 tablet (10 mg total) by mouth daily. 08/17/20   Franchot Gallo, MD   tamsulosin (FLOMAX) 0.4 MG CAPS capsule Take 2 capsules (0.8 mg total) by mouth daily. Patient taking differently: Take 0.8 mg by mouth at bedtime.  04/04/20   Mikey Kirschner, MD     No family history on file.  Social History   Socioeconomic History  . Marital status: Married    Spouse name: Not on file  . Number of children: Not on file  . Years of education: Not on file  . Highest education level: Not on file  Occupational History  . Not on file  Tobacco Use  . Smoking status: Former Smoker    Packs/day: 0.50    Years: 20.00    Pack years: 10.00    Types: Cigarettes  . Smokeless tobacco: Never Used  . Tobacco comment: quit 2010  Vaping Use  . Vaping Use: Never used  Substance and Sexual Activity  . Alcohol use: Yes    Comment: occ  2 x month  . Drug use: No  . Sexual activity: Yes  Other Topics Concern  . Not on file  Social History Narrative  . Not on file   Social Determinants of Health   Financial Resource Strain:   . Difficulty of Paying Living Expenses: Not on file  Food Insecurity:   . Worried About Charity fundraiser in the Last Year: Not on file  . Ran Out of Food in the Last Year: Not on file  Transportation Needs:   . Lack of Transportation (Medical): Not on file  . Lack of Transportation (Non-Medical): Not on file  Physical Activity:   . Days of Exercise per Week: Not on file  . Minutes of Exercise per Session: Not on file  Stress:   . Feeling of Stress : Not on file  Social Connections:   . Frequency of Communication with Friends and Family: Not on file  . Frequency of Social Gatherings with Friends and Family: Not on file  . Attends Religious Services: Not on file  . Active Member of Clubs or Organizations: Not on file  . Attends Archivist Meetings: Not on file  . Marital Status: Not on file      Review of Systems currently denies fever, headache, chest pain, dyspnea, cough, abdominal/back pain, nausea, vomiting or  bleeding  Vital Signs:pending   Physical Exam awake, alert.  Chest clear to auscultation bilaterally.  Heart with regular rate and rhythm.  Abdomen soft, positive bowel sounds, nontender.  Trace pretibial edema bilaterally.  Imaging: CT ABDOMEN PELVIS WO CONTRAST  Result Date: 09/23/2020 CLINICAL DATA:  Abdominal pain and fever, history of bladder cancer EXAM: CT ABDOMEN AND PELVIS WITHOUT CONTRAST TECHNIQUE: Multidetector CT imaging of the abdomen and pelvis was performed following the standard protocol without IV contrast. COMPARISON:  July 03, 2020 FINDINGS: Lower chest: The visualized heart size within normal limits. No pericardial fluid/thickening. No hiatal hernia. The visualized portions of the lungs are clear. Hepatobiliary: Although limited due to the lack of intravenous contrast, normal in appearance without gross focal abnormality. No evidence of calcified gallstones or biliary ductal dilatation. Pancreas:  Unremarkable.  No surrounding inflammatory changes. Spleen: Normal in size. Although limited due to the lack of intravenous contrast, normal  in appearance. Adrenals/Urinary Tract: Both adrenal glands appear normal. Mild bilateral pelvicaliectasis and ureterectasis is seen down to the level of the UVJ with mild perinephric and periureteral stranding changes. There is diffuse bladder wall thickening with asymmetric wall thickening/debris seen in the inferior portion. There is significant surrounding inflammatory changes. Stomach/Bowel: The stomach, small bowel, and colon are normal in appearance. No inflammatory changes or obstructive findings. Scattered colonic diverticula are noted. The appendix is unremarkable. Vascular/Lymphatic: There appears to be slight interval enlargement of the right pelvic sidewall lymph node measuring 1.2 cm and previously 0.7 cm, best seen on series 2, image 74. There is also slight interval enlargement of the right retroperitoneal lymph node overlying the psoas  musculature measuring 1.1 cm, series 2, image 65. scattered aortic atherosclerotic calcifications are seen without aneurysmal dilatation. Reproductive: The prostate is unremarkable. Other: No evidence of abdominal wall mass or hernia. Musculoskeletal: No acute or significant osseous findings. IMPRESSION: 1. Diffuse bladder wall thickening with slight nodularity at the inferior portion with significant surrounding inflammatory changes. There is also mild bilateral hydronephrosis. This could be due to cystitis or recurrent bladder neoplasm and would recommend direct visualization. 2. Slight interval enlargement in right pelvic sidewall and retroperitoneal adenopathy, which could be due to reactive adenopathy versus metastatic disease. 3.  Aortic Atherosclerosis (ICD10-I70.0). Electronically Signed   By: Prudencio Pair M.D.   On: 09/23/2020 22:12   US RENAL  Result Date: 09/25/2020 CLINICAL DATA:  Acute kidney injury. Reported history of bladder carcinoma EXAM: RENAL / URINARY TRACT ULTRASOUND COMPLETE COMPARISON:  CT abdomen and pelvis September 23, 2020. FINDINGS: Right Kidney: Renal measurements: 13.4 x 7.7 x 5.9 cm = volume: 322 mL. Echogenicity is mildly increased. Renal cortical thickness is normal. No mass or perinephric fluid visualized. There is moderate hydronephrosis on the right. No sonographically demonstrable calculus or appreciable ureterectasis. Left Kidney: Renal measurements: 13.6 x 7.6 x 5.4 cm = volume: 294 mL. Echogenicity is mildly increased. Renal cortical thickness is normal. No mass or perinephric fluid visualized. There is moderate hydronephrosis on the left. No sonographically demonstrable calculus or ureterectasis. Bladder: There is marked thickening of the wall of the urinary bladder, consistent with findings on recent CT. A mass arising from the posterior bladder is questioned. Other: None. IMPRESSION: 1. Persistent moderate hydronephrosis bilaterally. Wall thickening involving the  posterior bladder with questionable mass in this area may be causing impression on distal ureters and resulting in the hydronephrosis. This circumstance may warrant direct visualization of the bladder with retrograde assessment of the ureters and collecting systems. 2. The echogenicity of each kidney is increased which may be indicative of a degree of underlying medical renal disease. Renal cortical thickness bilaterally is within normal limits. These results will be called to the ordering clinician or representative by the Radiologist Assistant, and communication documented in the PACS or [employer]. Electronically Signed   By: Lowella Grip III M.D.   On: 09/25/2020 12:30    Labs:  CBC: Recent Labs    11/03/19 0827 04/24/20 0824 09/23/20 2017 09/24/20 0747 09/25/20 0726  WBC 8.7  --  9.2 8.7 7.5  HGB 15.4 14.3 13.2 12.8* 11.8*  HCT 45.9 42.0 41.1 40.4 37.0*  PLT 202  --  218 196 191    COAGS: Recent Labs    09/24/20 0747  INR 1.0  APTT 28    BMP: Recent Labs    11/03/19 0827 04/24/20 0824 09/23/20 2017 09/24/20 0747 09/25/20 0726 09/26/20 0839  NA 143   < >  140 142 141 139  K 4.2   < > 3.5 3.6 3.7 3.8  CL 105   < > 105 108 111 110  CO2 24  --  26 25 22  20*  GLUCOSE 99   < > 116* 99 100* 107*  BUN 23   < > 41* 35* 33* 39*  CALCIUM 9.0  --  8.3* 8.2* 8.0* 8.2*  CREATININE 0.99   < > 3.03* 2.88* 3.15* 4.01*  GFRNONAA 84  --  23* 25* 22* 16*  GFRAA 97  --   --   --   --   --    < > = values in this interval not displayed.    LIVER FUNCTION TESTS: Recent Labs    11/03/19 0827 09/23/20 2017 09/24/20 0747  BILITOT 0.6 0.6 0.8  AST 14 24 97*  ALT 16 31 35  ALKPHOS 73 46 44  PROT 6.2 6.9 6.5  ALBUMIN 4.0 3.8 3.5    TUMOR MARKERS: No results for input(s): AFPTM, CEA, CA199, CHROMGRNA in the last 8760 hours.  Assessment and Plan:  59 y.o. male past medical history of COVID-19 in December of last year, Fuchs corneal dystrophy, renal insufficiency,  GERD, hypertension, rheumatoid arthritis, peptic ulcer, bladder cancer with prior TURBT in September 2020 as well as an June 2021 with postop intravesical chemotherapy.  He was recently treated for yeast UTI.  He was discharged from [facility] on 09/26/20  following admission for abdominal pain (? reaction to diflucan).  CT scan performed at that time revealed mild bilateral hydroureteronephrosis, bladder wall irregularity/thickening, slight lateral pelvic lymphadenopathy.  Follow-up renal ultrasound revealed persistent moderate hydronephrosis bilaterally with wall thickening involving the posterior bladder with questionable mass in the area which may be causing compression of the distal ureters resulting in hydronephrosis.  Creatinine on day of discharge was 4.1.  He was evaluated by Dr. Diona Fanti and request now received for bilateral percutaneous nephrostomies. Imaging studies were reviewed by Dr. Pascal Lux.  Risks and benefits of bilateral PCN placement was discussed with the patient including, but not limited to, infection, bleeding, significant bleeding causing loss or decrease in renal function or damage to adjacent structures.   All of the patient's questions were answered, patient is agreeable to proceed.  Consent signed and in chart.      Thank you for this interesting consult.  I greatly enjoyed meeting Barry Gibson and look forward to participating in their care.  A copy of this report was sent to the requesting provider on this date.  Electronically Signed: D. Rowe Robert, PA-C 09/27/2020, 1:59 PM   I spent a total of 25 minutes  in face to face in clinical consultation, greater than 50% of which was counseling/coordinating care for bilateral percutaneous nephrostomies

## 2020-09-27 NOTE — Discharge Instructions (Signed)
Call Dr Zannie Cove  [phone] or Interventional Radiology [phone] after hours) if there are any problems     Percutaneous Nephrostomy Home Guide Percutaneous nephrostomy is a procedure to insert a flexible tube into your kidney so that urine can leave your body. This procedure may be done if a medical condition prevents urine from leaving your kidney in the usual way. During the procedure, the nephrostomy tube is inserted in the right or left side of your lower back and is connected to an external drainage bag. After you have a nephrostomy tube placed, urine will collect in the drainage bag outside of your body. You will need to empty and change the drainage bag as needed. You will also need to take steps to care for the area where the nephrostomy tube was inserted (tube insertion site). How do I care for my nephrostomy tube?  Always keep your tubing, the leg bag, or the bedside drainage bag below the level of your kidney so that your urine drains freely.  Avoid activities that would cause bending or pulling of your tubing. Ask your health care provider what activities are safe for you.  When connecting your nephrostomy tube to a drainage bag, make sure that there are no kinks in the tubing and that your urine is draining freely. You may want to gently wrap an elastic bandage over the tubing. This will help keep the tubing in place and prevent it from kinking. Make sure there is no tension on the tubing so it does not become dislodged.  At night, you may want to connect your nephrostomy tube or the leg bag to a larger bedside drainage bag. How do I empty the drainage bag? Empty the leg bag or bedside drainage bag whenever it becomes ? full. Also empty it before you go to sleep. Most drainage bags have a drain at the bottom that allows urine to be emptied. Follow these basic steps: 1. Hold the drainage bag over a toilet or collection container. Use a measuring container if  your health care provider told you to measure your urine. Keep a log of the urine output for each of your nephrostomy tubes. 2. Open the drain of the bag and allow the urine to drain out. 3. After all the urine has drained from the drainage bag, close the drain fully. 4. Flush the urine down the toilet. If a collection container was used, rinse the container. How do I change the dressing around the nephrostomy tube? Change your dressing and clean your tube exit site as told by your health care provider. You may need to change the dressing every day for the first 2 weeks after having a nephrostomy tube inserted. After the first 2 weeks, you may be told to change the dressing two times a week. Supplies needed:  Mild soap and water.  Split gauze pads, 4  4 inches (10 x 10 cm).  Gauze pads, 4  4 inches (10 x 10 cm).  Paper tape. How to change the dressing: Because of the location of your nephrostomy tube, you may need help from another person to complete dressing changes. Follow these basic steps: 1. Wash hands with soap and water. 2. Gently remove the tape and dressing from around the nephrostomy tube. Be careful not to pull on the tube while removing the dressing. Avoid using scissors because they may damage the tube. 3. Wash the skin around the tube with mild soap and water, rinse well, and pat the skin dry  with a clean cloth. 4. Check the skin around the drain for redness, swelling, pus, warmth, or a bad smell. 5. If the drain was sutured to the skin, check the suture to verify that it is still anchored in the skin. 6. Place two  gauze pads in and around the tube exit site. Do not apply ointments or alcohol to the site. 7. Place a gauze pad on top of the split gauze pad. 8. Coil the tube on top of the gauze. The tubing should rest on the gauze, not on the skin. 9. Place tape around each edge of the gauze pad. 10. Secure the nephrostomy tubing. Make sure that the tube does not kink or  become pinched. The tubing should rest on the gauze pad, not on the skin. 11. Dispose of used supplies properly.  How do I flush my nephrostomy tube? Use a saline syringe to rinse out (flush) your nephrostomy tube as told by your health care provider. . Supplies needed:  Rubbing alcohol wipe.  10 mL 0.9% saline syringe. How to flush the tube: 1. Remove the drainage bag tubing from the nephrostomy tube.  2. Clean the cap with a rubbing alcohol wipe. 3. Screw the tip of a 10 mL 0.9% saline syringe onto the cap. 4. Using the syringe plunger, slowly push the 10 mL 0.9% saline in the syringe over 5-10 seconds. If resistance is met or pain occurs while pushing, stop pushing the saline. 5. Remove the syringe from the cap. 6. Attach the drainage bag tubing back to the nephrostomy tube 7. Dispose of used supplies properly. How do I replace the drainage bag? Replace the drainage bag, three-way stopcock, and any extension tubing as told by your health care provider. Make sure you always have an extra drainage bag and connecting tubing available. 1. Empty urine from your drainage bag. 2. Gather a new drainage bag, three-way stopcock, and any extension tubing. 3. Remove the drainage bag, three-way stopcock, and any extension tubing from the nephrostomy tube. 4. Attach the new leg bag or bedside drainage bag, three-way stopcock, and any extension tubing to the nephrostomy tube. 5. Dispose of the used drainage bag, three-way stopcock, and any extension. Contact a health care provider if:  You have problems with any of the valves or tubing.  You have persistent pain or soreness in your back.  You have more redness, swelling, or pain around your tube insertion site.  You have more fluid or blood coming from your tube insertion site.  Your tube insertion site feels warm to the touch.  You have pus or a bad smell coming from your tube insertion site.  You have increased urine output or you feel  burning when urinating. Get help right away if:  You have pain in your abdomen during the first week.  You have chest pain or have trouble breathing.  You have a new appearance of blood in your urine.  You have a fever or chills.  You have back pain that is not relieved by your medicine.  You have decreased urine output.  Your nephrostomy tube comes out. This information is not intended to replace advice given to you by your health care provider. Make sure you discuss any questions you have with your health care provider. Document Revised: 02/25/2019 Document Reviewed: 08/16/2016 Elsevier Patient Education  [place].     Moderate Conscious Sedation, Adult, Care After These instructions provide you with information about caring for yourself after your procedure. Your  health care provider may also give you more specific instructions. Your treatment has been planned according to current medical practices, but problems sometimes occur. Call your health care provider if you have any problems or questions after your procedure. What can I expect after the procedure? After your procedure, it is common:  To feel sleepy for several hours.  To feel clumsy and have poor balance for several hours.  To have poor judgment for several hours.  To vomit if you eat too soon. Follow these instructions at home: For at least 24 hours after the procedure:   Do not: ? Participate in activities where you could fall or become injured. ? Drive. ? Use heavy machinery. ? Drink alcohol. ? Take sleeping pills or medicines that cause drowsiness. ? Make important decisions or sign legal documents. ? Take care of children on your own.  Rest. Eating and drinking  Follow the diet recommended by your health care provider.  If you vomit: ? Drink water, juice, or soup when you can drink without vomiting. ? Make sure you have little or no nausea before eating solid foods. General  instructions  Have a responsible adult stay with you until you are awake and alert.  Take over-the-counter and prescription medicines only as told by your health care provider.  If you smoke, do not smoke without supervision.  Keep all follow-up visits as told by your health care provider. This is important. Contact a health care provider if:  You keep feeling nauseous or you keep vomiting.  You feel light-headed.  You develop a rash.  You have a fever. Get help right away if:  You have trouble breathing. This information is not intended to replace advice given to you by your health care provider. Make sure you discuss any questions you have with your health care provider. Document Revised: 10/17/2017 Document Reviewed: 02/24/2016 Elsevier Patient Education  2020 [employer].

## 2020-09-27 NOTE — Telephone Encounter (Signed)
Lmtc. Labs ordered.

## 2020-09-28 ENCOUNTER — Other Ambulatory Visit (HOSPITAL_COMMUNITY): Payer: BC Managed Care – PPO

## 2020-09-28 ENCOUNTER — Ambulatory Visit (HOSPITAL_COMMUNITY): Payer: BC Managed Care – PPO

## 2020-09-28 ENCOUNTER — Telehealth: Payer: Self-pay

## 2020-09-28 ENCOUNTER — Other Ambulatory Visit: Payer: Self-pay | Admitting: Urology

## 2020-09-28 DIAGNOSIS — C679 Malignant neoplasm of bladder, unspecified: Secondary | ICD-10-CM

## 2020-09-28 NOTE — Telephone Encounter (Signed)
-----   Message from Franchot Gallo, MD sent at 09/28/2020 11:50 AM EST ----- This man had bilateral percutaneous tubes placed yesterday.  I put orders in to have his chemistry and CBC checked tomorrow.  Could you call his wife and tell him to come in?  Also, let them know that I am working on setting up an appointment for him to see Dr. Tresa Moore to discuss cystectomy.

## 2020-09-28 NOTE — Telephone Encounter (Signed)
Patient notified

## 2020-09-28 NOTE — Telephone Encounter (Signed)
Barnett Applebaum (wife) returning call to [facility] regarding Kalvin Buss back [phone]

## 2020-09-29 ENCOUNTER — Other Ambulatory Visit: Payer: BC Managed Care – PPO

## 2020-09-29 ENCOUNTER — Other Ambulatory Visit: Payer: Self-pay

## 2020-09-29 ENCOUNTER — Ambulatory Visit: Payer: BC Managed Care – PPO

## 2020-09-29 DIAGNOSIS — C679 Malignant neoplasm of bladder, unspecified: Secondary | ICD-10-CM

## 2020-09-30 LAB — CBC
Hematocrit: 39 % (ref 37.5–51.0)
Hemoglobin: 12.7 g/dL — ABNORMAL LOW (ref 13.0–17.7)
MCH: 29.7 pg (ref 26.6–33.0)
MCHC: 32.6 g/dL (ref 31.5–35.7)
MCV: 91 fL (ref 79–97)
Platelets: 233 10*3/uL (ref 150–450)
RBC: 4.27 x10E6/uL (ref 4.14–5.80)
RDW: 14.2 % (ref 11.6–15.4)
WBC: 7.1 10*3/uL (ref 3.4–10.8)

## 2020-09-30 LAB — BASIC METABOLIC PANEL
BUN/Creatinine Ratio: 9 (ref 9–20)
BUN: 28 mg/dL — ABNORMAL HIGH (ref 6–24)
CO2: 22 mmol/L (ref 20–29)
Calcium: 8.9 mg/dL (ref 8.7–10.2)
Chloride: 107 mmol/L — ABNORMAL HIGH (ref 96–106)
Creatinine, Ser: 3.1 mg/dL — ABNORMAL HIGH (ref 0.76–1.27)
GFR calc Af Amer: 24 mL/min/{1.73_m2} — ABNORMAL LOW (ref >59)
GFR calc non Af Amer: 21 mL/min/{1.73_m2} — ABNORMAL LOW (ref >59)
Glucose: 125 mg/dL — ABNORMAL HIGH (ref 65–99)
Potassium: 3.9 mmol/L (ref 3.5–5.2)
Sodium: 145 mmol/L — ABNORMAL HIGH (ref 134–144)

## 2020-10-03 ENCOUNTER — Ambulatory Visit: Payer: BC Managed Care – PPO

## 2020-10-03 ENCOUNTER — Telehealth: Payer: Self-pay

## 2020-10-03 ENCOUNTER — Other Ambulatory Visit: Payer: Self-pay | Admitting: Urology

## 2020-10-03 DIAGNOSIS — C679 Malignant neoplasm of bladder, unspecified: Secondary | ICD-10-CM

## 2020-10-03 NOTE — Telephone Encounter (Signed)
Pt notified. Lab appt scheduled for Friday 11/19

## 2020-10-03 NOTE — Telephone Encounter (Signed)
-----   Message from Franchot Gallo, MD sent at 10/03/2020  8:10 AM EST ----- Notify pt--kidney fxn better last week--would like to recheck w/in next 1-2 days--IO put orders in. ----- Message ----- From: Iris Pert, LPN Sent: [id number]   8:17 AM EST To: Franchot Gallo, MD  Please review

## 2020-10-06 ENCOUNTER — Other Ambulatory Visit: Payer: Self-pay

## 2020-10-06 ENCOUNTER — Ambulatory Visit: Payer: BC Managed Care – PPO

## 2020-10-06 ENCOUNTER — Other Ambulatory Visit: Payer: BC Managed Care – PPO

## 2020-10-06 DIAGNOSIS — C679 Malignant neoplasm of bladder, unspecified: Secondary | ICD-10-CM

## 2020-10-07 LAB — BASIC METABOLIC PANEL
BUN/Creatinine Ratio: 10 (ref 9–20)
BUN: 20 mg/dL (ref 6–24)
CO2: 20 mmol/L (ref 20–29)
Calcium: 8.8 mg/dL (ref 8.7–10.2)
Chloride: 100 mmol/L (ref 96–106)
Creatinine, Ser: 2.03 mg/dL — ABNORMAL HIGH (ref 0.76–1.27)
GFR calc Af Amer: 41 mL/min/{1.73_m2} — ABNORMAL LOW (ref >59)
GFR calc non Af Amer: 35 mL/min/{1.73_m2} — ABNORMAL LOW (ref >59)
Glucose: 135 mg/dL — ABNORMAL HIGH (ref 65–99)
Potassium: 3.8 mmol/L (ref 3.5–5.2)
Sodium: 136 mmol/L (ref 134–144)

## 2020-10-09 ENCOUNTER — Ambulatory Visit: Payer: BC Managed Care – PPO

## 2020-10-09 ENCOUNTER — Other Ambulatory Visit: Payer: Self-pay | Admitting: Urology

## 2020-10-17 ENCOUNTER — Other Ambulatory Visit: Payer: Self-pay

## 2020-10-17 ENCOUNTER — Ambulatory Visit (INDEPENDENT_AMBULATORY_CARE_PROVIDER_SITE_OTHER): Payer: BC Managed Care – PPO | Admitting: Urology

## 2020-10-17 ENCOUNTER — Encounter: Payer: Self-pay | Admitting: Urology

## 2020-10-17 VITALS — BP 118/80 | HR 94

## 2020-10-17 DIAGNOSIS — C679 Malignant neoplasm of bladder, unspecified: Secondary | ICD-10-CM | POA: Diagnosis not present

## 2020-10-17 NOTE — Progress Notes (Signed)
Urological Symptom Review  Patient is experiencing the following symptoms: Injury to kidneys/bladder   Review of Systems  Gastrointestinal (upper)  : Nausea  Gastrointestinal (lower) : Negative for lower GI symptoms  Constitutional : Weight loss  Skin: Negative for skin symptoms  Eyes: Negative for eye symptoms  Ear/Nose/Throat : Negative for Ear/Nose/Throat symptoms  Hematologic/Lymphatic: Negative for Hematologic/Lymphatic symptoms  Cardiovascular : Negative for cardiovascular symptoms  Respiratory : Negative for respiratory symptoms  Endocrine: Negative for endocrine symptoms  Musculoskeletal: Negative for musculoskeletal symptoms  Neurological: Negative for neurological symptoms  Psychologic: Negative for psychiatric symptoms

## 2020-10-17 NOTE — Progress Notes (Signed)
H&P  Chief Complaint: Bladder Cancer  History of Present Illness:   11.30.2021: Barry Gibson met with Dr. Tresa Moore a couple of weeks ago, and is scheduled for cystectomy in early/mid January.  He is quite anxious about having to wait that long, but does understand that there are procedures scheduled before his with Dr. Tresa Moore.  Pt here requesting an expedited procedure.  (below copied from [facility] records):  9.17.2020:TURBT. Multifocal lesions seen, most on left bladder wall. Small perforation on left d/t obturator reflex. Gemzar not placed. He experienced postop retention following cath removal necessitating repeat catheter placement. Path--HG NMIBC (St T1), muscularis present.   10.27.2020: He underwent 2nd TURBT. 2 lesions resected-left anterior bladder neck and left bladder wall. Pathology revealed persistent high-grade non muscle invasive bladder cancer. Gemcitabine placed following procedure.   12.15.2020:Completed 6 wks of BCG induction  1.12.2021:Cystoscopy revealed no overt recurrence. Cytology revealed atypia.  3.23.2021:Completed second of 3 BCG maintenance treatments. He could not tolerate a third.  5.11.2021:He presents for cystoscopy. He does state that he did not tolerate BCG as it caused significant fatigue. He is having intermittent gross painless hematuria, especially when he does heavy activities. He has urinary frequency urgency and slow stream despite being on 2 tamsulosin a day.  6.7.2021: Cystoscopy, transurethral resection of bladder tumor, 6 to 7 cm in size, placement of postoperative intravesical gemcitabine. Path-- A. BLADDER, TURBT:  Invasive high grade urothelial carcinoma  The carcinoma invades laminar propria  Muscularis propria (detrusor muscle) is present and not involved   B. PROSTATIC URETHRA, BIOPSY:  Scant benign urothelium  Submucosa with chronic inflammation and BCG granulomas  8.27.2021: Completed 5/6 planned BCG treatments.  6th treatment  not given due to being on national back order.  He tolerated his treatment well.  He has not had significant complaints of hematuria.  Still having frequency and urgency.  9.28.2021: Winnie is here today for follow-up of urothelial carcinoma the bladder. He has high-grade nonmuscle invasive bladder cancer.  11.18.2021 - Dr. Tresa Moore - 1 - BCG-Refractory High Grade Bladder Cancer - T1G3 bladder cancer treated with TURBT + BCG x 6 and some maintenance 2020. Recurrence 04/2020 T1G3 with muscle in specimen and then new bilateral malignant hydro on CT 09/2020 (clinical stage 3).   2 - Malignant Hydronephrosis - new bilateral moderate hydro to bladder on restaging CT 09/2020. Neph tubes placed 09/2020. Cr peak 4.8, Baseline Cr 1.1.   3 - Prostate Screening - PSA 0.7 2019.   4 - Fevers, Maliase- some on / off fevers few days 09/2020. Has bilat neph tubes, no other localized symptoms. UCX pending, placed on empiric Keflex.   PMH sig for RA (Humira, follows [facility]), congenital L hand deformity. NO CV disease / blood thinners. He works in Investment banker, corporate for [employer]. His wife "[name]" is very involved. HIs PCP is Sallee Lange MD   Today " Barry Gibson " is seen to discuss cystoprostatectomy as curative intent managemnt for his high grade BCG refractory bladder cancer now with some new bilateral hydro.     Past Medical History:  Diagnosis Date  . Cancer ([place])    bladder  . Cataract   . COVID-19 11/13/2019   h/a, chills, fatigue, loss of taste and smell, all symptoms resolved in a few weeks  . Frequent urination   . Fuchs' corneal dystrophy   . Full dentures   . GERD (gastroesophageal reflux disease)   . Hypertension   . Rheumatoid arthritis ([facility])     Past  Surgical History:  Procedure Laterality Date  . CORNEAL TRANSPLANT Left 2013  . CYSTOSCOPY WITH BIOPSY N/A 09/14/2019   Procedure: CYSTOSCOPY WITH BIOPSY;  Surgeon: Franchot Gallo, MD;  Location: [facility];  Service: Urology;   Laterality: N/A;  . FULGURATION OF BLADDER TUMOR N/A 09/14/2019   Procedure: FULGURATION OF BLADDER TUMOR;  Surgeon: Franchot Gallo, MD;  Location: [facility];  Service: Urology;  Laterality: N/A;  . IR NEPHROSTOMY PLACEMENT LEFT  09/27/2020  . IR NEPHROSTOMY PLACEMENT RIGHT  09/27/2020  . TRANSURETHRAL RESECTION OF BLADDER TUMOR WITH MITOMYCIN-C N/A 08/05/2019   Procedure: TRANSURETHRAL RESECTION OF BLADDER TUMOR;  Surgeon: Franchot Gallo, MD;  Location: [facility];  Service: Urology;  Laterality: N/A;  1 HR  . TRANSURETHRAL RESECTION OF BLADDER TUMOR WITH MITOMYCIN-C N/A 04/24/2020   Procedure: TRANSURETHRAL RESECTION OF BLADDER TUMOR WITH GEMCIDABINE;  Surgeon: Franchot Gallo, MD;  Location: [facility];  Service: Urology;  Laterality: N/A;    Home Medications:  Allergies as of 10/17/2020      Reactions   Ace Inhibitors Cough   Diflucan [fluconazole] Other (See Comments)   Irritated ulcers      Medication List       Accurate as of October 17, 2020  9:09 AM. If you have any questions, ask your nurse or doctor.        acetaminophen 650 MG CR tablet Commonly known as: TYLENOL Take 650 mg by mouth every 8 (eight) hours as needed for pain.   amLODipine 10 MG tablet Commonly known as: NORVASC Take 1 tablet (10 mg total) by mouth daily. For BP   fluconazole 100 MG tablet Commonly known as: DIFLUCAN Take 1 tablet (100 mg total) by mouth daily.   Humira Pen 40 MG/0.4ML Pnkt Generic drug: Adalimumab Inject 40 mg into the skin every 14 (fourteen) days.   leflunomide 20 MG tablet Commonly known as: ARAVA Take 20 mg by mouth daily.   omeprazole 40 MG capsule Commonly known as: PRILOSEC Take 40 mg by mouth daily.   pravastatin 40 MG tablet Commonly known as: PRAVACHOL Take 1 tablet (40 mg total) by mouth daily. What changed: how much to take   prednisoLONE acetate 1 % ophthalmic suspension Commonly known as: PRED FORTE Place 1 drop  into both eyes 2 (two) times a week. Instill 1 drop into both eyes Mondays and Friday.   solifenacin 10 MG tablet Commonly known as: VESICARE Take 1 tablet (10 mg total) by mouth daily.   tamsulosin 0.4 MG Caps capsule Commonly known as: FLOMAX Take 2 capsules (0.8 mg total) by mouth daily. What changed: when to take this       Allergies:  Allergies  Allergen Reactions  . Ace Inhibitors Cough  . Diflucan [Fluconazole] Other (See Comments)    Irritated ulcers    No family history on file.  Social History:  reports that he has quit smoking. His smoking use included cigarettes. He has a 10.00 pack-year smoking history. He has never used smokeless tobacco. He reports current alcohol use. He reports that he does not use drugs.  ROS: A complete review of systems was performed.  All systems are negative except for pertinent findings as noted.  Physical Exam:  Vital signs in last 24 hours: There were no vitals taken for this visit. Constitutional:  Alert and oriented, No acute distress Cardiovascular: Regular rate  Respiratory: Normal respiratory effort Neurologic: Grossly intact, no focal deficits Psychiatric: Normal mood and affect  I have reviewed  prior pt notes  I have reviewed notes from referring/previous physicians  I have reviewed urinalysis results  I have independently reviewed prior imaging   Impression/Assessment:  PCa - Pt is anxious, depressed, uncomfortable with his nephrostomy tubes and looking for an expedited resolution to his current status.  Plan:  1. I did discuss w/ Barry Gibson and his wife that there are other oncologic cases scheduled before his--he understands this. I advised, since he has seen Dr Raymond Gurney @ [facility] before that he call and see if perhaps they can perform this sooner  2. Pt advised regarding use of abx should he develop a fever accompanied by kidney pain.  3. Pt to f/u in next week for nephrostomy tube change.  CC: Dr. Elvia Collum

## 2020-10-18 ENCOUNTER — Ambulatory Visit (INDEPENDENT_AMBULATORY_CARE_PROVIDER_SITE_OTHER): Payer: BC Managed Care – PPO | Admitting: Family Medicine

## 2020-10-18 ENCOUNTER — Encounter: Payer: Self-pay | Admitting: Family Medicine

## 2020-10-18 VITALS — BP 134/90 | HR 94 | Temp 97.6°F | Wt 216.6 lb

## 2020-10-18 DIAGNOSIS — I10 Essential (primary) hypertension: Secondary | ICD-10-CM

## 2020-10-18 DIAGNOSIS — N179 Acute kidney failure, unspecified: Secondary | ICD-10-CM | POA: Diagnosis not present

## 2020-10-18 DIAGNOSIS — C679 Malignant neoplasm of bladder, unspecified: Secondary | ICD-10-CM

## 2020-10-18 DIAGNOSIS — E782 Mixed hyperlipidemia: Secondary | ICD-10-CM

## 2020-10-18 MED ORDER — PRAVASTATIN SODIUM 40 MG PO TABS
40.0000 mg | ORAL_TABLET | Freq: Every day | ORAL | 1 refills | Status: DC
Start: 2020-10-18 — End: 2021-04-11

## 2020-10-18 MED ORDER — AMLODIPINE BESYLATE 10 MG PO TABS
10.0000 mg | ORAL_TABLET | Freq: Every day | ORAL | 1 refills | Status: DC
Start: 2020-10-18 — End: 2021-04-11

## 2020-10-18 NOTE — Progress Notes (Signed)
Patient ID: Barry Gibson, male    DOB: Dec 13, 1960, 59 y.o.   MRN: [id number]   Chief Complaint  Patient presents with  . Hypertension  . Hyperlipidemia  . Follow-up    No concerns today. Patient will be having bladder sometime in the near furture.    Subjective:    HPI  F/u htn, hld, and status of bladder cancer.  Pt has h/o urothelial carcinoma of bladder. Had bladder cancer surgery coming up.  They are going to do cystectomy.  Pt currently has bilateral nephrostomy tubes in place from 11/21.  Currently scheduled for January 2022. Pt having surgery done by Dr. Tresa Moore.  Urologist is Dr. Diona Fanti. Pt was given option of seeing urology at [facility] to see if can get the surgery done sooner.  Pt not wanting to do that due to distance for his family.  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  Having issues with bladder and " caused the kidneys to dec function."  Pt had bladder s/p turbt due to malignant neoplasm. Not having chemo.  Cr 2.03, 10/06/20. Was at 3.1 on 09/29/20. Dec GFR.  10/29/20- will get nephrostomy tubes changed out.    Medical History Belinda has a past medical history of Cancer [facility]), Cataract, COVID-19 (11/13/2019), Frequent urination, Fuchs' corneal dystrophy, Full dentures, GERD (gastroesophageal reflux disease), Hypertension, and Rheumatoid arthritis ([place]).   Outpatient Encounter Medications as of 10/18/2020  Medication Sig  . acetaminophen (TYLENOL) 650 MG CR tablet Take 650 mg by mouth every 8 (eight) hours as needed for pain.  Marland Kitchen amLODipine (NORVASC) 10 MG tablet Take 1 tablet (10 mg total) by mouth daily. For BP  . leflunomide (ARAVA) 20 MG tablet Take 20 mg by mouth daily.  Marland Kitchen omeprazole (PRILOSEC) 40 MG capsule Take 40 mg by mouth daily.  . pravastatin (PRAVACHOL) 40 MG tablet Take 1 tablet (40 mg total) by mouth daily.  . prednisoLONE acetate (PRED FORTE) 1 % ophthalmic suspension Place 1 drop  into both eyes 2 (two) times a week. Instill 1 drop into both eyes Mondays and Friday.  . [DISCONTINUED] amLODipine (NORVASC) 10 MG tablet Take 1 tablet (10 mg total) by mouth daily. For BP  . [DISCONTINUED] HUMIRA PEN 40 MG/0.4ML PNKT Inject 40 mg into the skin every 14 (fourteen) days.  . [DISCONTINUED] pravastatin (PRAVACHOL) 40 MG tablet Take 1 tablet (40 mg total) by mouth daily. (Patient taking differently: Take 80 mg by mouth daily. )  . [DISCONTINUED] solifenacin (VESICARE) 10 MG tablet Take 1 tablet (10 mg total) by mouth daily.  . [DISCONTINUED] tamsulosin (FLOMAX) 0.4 MG CAPS capsule Take 2 capsules (0.8 mg total) by mouth daily. (Patient taking differently: Take 0.8 mg by mouth at bedtime. )   Facility-Administered Encounter Medications as of 10/18/2020  Medication  . gemcitabine (GEMZAR) chemo syringe for bladder instillation 2,000 mg  . gemcitabine (GEMZAR) chemo syringe for bladder instillation 2,000 mg     Review of Systems  Constitutional: Negative for chills and fever.  HENT: Negative for congestion, rhinorrhea and sore throat.   Respiratory: Negative for cough, shortness of breath and wheezing.   Cardiovascular: Negative for chest pain and leg swelling.  Gastrointestinal: Negative for abdominal pain, diarrhea, nausea and vomiting.  Genitourinary: Negative for dysuria and frequency.  Musculoskeletal: Positive for back pain (pain from nephrostomy tubes in place).  Skin: Negative for rash.  Neurological: Negative for dizziness, weakness and headaches.  Vitals BP 134/90   Pulse 94   Temp 97.6 F (36.4 C)   Wt 216 lb 9.6 oz (98.2 kg)   SpO2 96%   BMI 30.21 kg/m   Objective:   Physical Exam Vitals and nursing note reviewed.  Constitutional:      General: He is not in acute distress.    Appearance: Normal appearance. He is not ill-appearing.  HENT:     Head: Normocephalic.     Nose: Nose normal. No congestion.     Mouth/Throat:     Mouth: Mucous membranes  are moist.     Pharynx: No oropharyngeal exudate.  Eyes:     Extraocular Movements: Extraocular movements intact.     Conjunctiva/sclera: Conjunctivae normal.     Pupils: Pupils are equal, round, and reactive to light.  Cardiovascular:     Rate and Rhythm: Normal rate and regular rhythm.     Pulses: Normal pulses.     Heart sounds: Normal heart sounds. No murmur heard.   Pulmonary:     Effort: Pulmonary effort is normal.     Breath sounds: Normal breath sounds. No wheezing, rhonchi or rales.  Musculoskeletal:        General: Normal range of motion.     Right lower leg: No edema.     Left lower leg: No edema.     Comments: +bilateral nephrostomy tubes in place.  Skin:    General: Skin is warm and dry.     Findings: No rash.  Neurological:     General: No focal deficit present.     Mental Status: He is alert and oriented to person, place, and time.     Cranial Nerves: No cranial nerve deficit.  Psychiatric:        Mood and Affect: Mood normal.        Behavior: Behavior normal.        Thought Content: Thought content normal.        Judgment: Judgment normal.      Assessment and Plan   1. Essential hypertension, benign  2. AKI (acute kidney injury) ([place]) - CBC - CMP14+EGFR  3. Malignant neoplasm of urinary bladder, unspecified site ([place])  4. Mixed hyperlipidemia    HTN-- stable, suboptimal.  Pt in pain with nephrostomy tubes in place. cont meds.  HLD- stable. Cont meds.  Refractory bladder cancer with h/o AKI and hydronephrosis- cont to monitor. Pt has nephrostomy tubes in place.  Pt to f/u with Urology.  Cr 3.1 on 09/29/20 and on 10/06/20 is dec to 2.03. pt on schedule for surgery for cystoprostatectomy in early/mid January.  Call or rto if worsening pain or fever.   F/u 9moor prn.

## 2020-10-24 ENCOUNTER — Ambulatory Visit: Payer: BC Managed Care – PPO | Admitting: Urology

## 2020-10-30 ENCOUNTER — Ambulatory Visit (HOSPITAL_COMMUNITY)
Admission: RE | Admit: 2020-10-30 | Discharge: 2020-10-30 | Disposition: A | Discharge status: Home / Self Care | Payer: BC Managed Care – PPO | Source: Ambulatory Visit | Attending: Urology | Admitting: Urology

## 2020-10-30 ENCOUNTER — Other Ambulatory Visit: Payer: Self-pay

## 2020-10-30 ENCOUNTER — Other Ambulatory Visit: Payer: Self-pay | Admitting: Urology

## 2020-10-30 DIAGNOSIS — C679 Malignant neoplasm of bladder, unspecified: Secondary | ICD-10-CM

## 2020-10-30 DIAGNOSIS — Z436 Encounter for attention to other artificial openings of urinary tract: Secondary | ICD-10-CM | POA: Insufficient documentation

## 2020-10-30 HISTORY — PX: IR NEPHROSTOMY EXCHANGE LEFT: IMG6069

## 2020-10-30 HISTORY — PX: IR NEPHROSTOMY EXCHANGE RIGHT: IMG6070

## 2020-10-30 MED ORDER — LIDOCAINE HCL 1 % IJ SOLN
INTRAMUSCULAR | Status: DC | PRN
  Administered 2020-10-30: 10 mL via INTRADERMAL

## 2020-10-30 MED ORDER — LIDOCAINE HCL 1 % IJ SOLN
INTRAMUSCULAR | Status: AC
  Filled 2020-10-30: qty 20

## 2020-10-30 MED ORDER — IOHEXOL 300 MG/ML  SOLN
50.0000 mL | Freq: Once | INTRAMUSCULAR | Status: AC | PRN
  Administered 2020-10-30: 12 mL

## 2020-11-18 ENCOUNTER — Emergency Department (HOSPITAL_COMMUNITY)
Admission: EM | Admit: 2020-11-18 | Discharge: 2020-11-19 | Disposition: A | Discharge status: Home / Self Care | Payer: BC Managed Care – PPO | Attending: Emergency Medicine | Admitting: Emergency Medicine

## 2020-11-18 ENCOUNTER — Emergency Department (HOSPITAL_COMMUNITY): Payer: BC Managed Care – PPO

## 2020-11-18 ENCOUNTER — Other Ambulatory Visit: Payer: Self-pay

## 2020-11-18 ENCOUNTER — Encounter (HOSPITAL_COMMUNITY): Payer: Self-pay | Admitting: *Deleted

## 2020-11-18 DIAGNOSIS — Y841 Kidney dialysis as the cause of abnormal reaction of the patient, or of later complication, without mention of misadventure at the time of the procedure: Secondary | ICD-10-CM | POA: Insufficient documentation

## 2020-11-18 DIAGNOSIS — T83092A Other mechanical complication of nephrostomy catheter, initial encounter: Secondary | ICD-10-CM | POA: Insufficient documentation

## 2020-11-18 DIAGNOSIS — Z8551 Personal history of malignant neoplasm of bladder: Secondary | ICD-10-CM | POA: Diagnosis not present

## 2020-11-18 DIAGNOSIS — Z87891 Personal history of nicotine dependence: Secondary | ICD-10-CM | POA: Diagnosis not present

## 2020-11-18 DIAGNOSIS — Z79899 Other long term (current) drug therapy: Secondary | ICD-10-CM | POA: Diagnosis not present

## 2020-11-18 DIAGNOSIS — Z8616 Personal history of COVID-19: Secondary | ICD-10-CM | POA: Diagnosis not present

## 2020-11-18 DIAGNOSIS — N99528 Other complication of other external stoma of urinary tract: Secondary | ICD-10-CM

## 2020-11-18 DIAGNOSIS — I10 Essential (primary) hypertension: Secondary | ICD-10-CM | POA: Diagnosis not present

## 2020-11-18 DIAGNOSIS — Z20822 Contact with and (suspected) exposure to covid-19: Secondary | ICD-10-CM | POA: Insufficient documentation

## 2020-11-18 DIAGNOSIS — R109 Unspecified abdominal pain: Secondary | ICD-10-CM | POA: Diagnosis present

## 2020-11-18 LAB — CBC WITH DIFFERENTIAL/PLATELET
Abs Immature Granulocytes: 0.05 10*3/uL (ref 0.00–0.07)
Basophils Absolute: 0.1 10*3/uL (ref 0.0–0.1)
Basophils Relative: 1 %
Eosinophils Absolute: 0.2 10*3/uL (ref 0.0–0.5)
Eosinophils Relative: 2 %
HCT: 36.5 % — ABNORMAL LOW (ref 39.0–52.0)
Hemoglobin: 11.8 g/dL — ABNORMAL LOW (ref 13.0–17.0)
Immature Granulocytes: 1 %
Lymphocytes Relative: 16 %
Lymphs Abs: 1.8 10*3/uL (ref 0.7–4.0)
MCH: 29.6 pg (ref 26.0–34.0)
MCHC: 32.3 g/dL (ref 30.0–36.0)
MCV: 91.5 fL (ref 80.0–100.0)
Monocytes Absolute: 0.9 10*3/uL (ref 0.1–1.0)
Monocytes Relative: 8 %
Neutro Abs: 7.9 10*3/uL — ABNORMAL HIGH (ref 1.7–7.7)
Neutrophils Relative %: 72 %
Platelets: 293 10*3/uL (ref 150–400)
RBC: 3.99 MIL/uL — ABNORMAL LOW (ref 4.22–5.81)
RDW: 13.5 % (ref 11.5–15.5)
WBC: 10.9 10*3/uL — ABNORMAL HIGH (ref 4.0–10.5)
nRBC: 0 % (ref 0.0–0.2)

## 2020-11-18 LAB — URINALYSIS, ROUTINE W REFLEX MICROSCOPIC
Bilirubin Urine: NEGATIVE
Glucose, UA: NEGATIVE mg/dL
Ketones, ur: NEGATIVE mg/dL
Nitrite: NEGATIVE
Protein, ur: 100 mg/dL — AB
Specific Gravity, Urine: 1.011 (ref 1.005–1.030)
WBC, UA: >50 WBC/hpf — ABNORMAL HIGH (ref 0–5)
pH: 6 (ref 5.0–8.0)

## 2020-11-18 LAB — COMPREHENSIVE METABOLIC PANEL
ALT: 12 U/L (ref 0–44)
AST: 15 U/L (ref 15–41)
Albumin: 3.6 g/dL (ref 3.5–5.0)
Alkaline Phosphatase: 49 U/L (ref 38–126)
Anion gap: 12 (ref 5–15)
BUN: 28 mg/dL — ABNORMAL HIGH (ref 6–20)
CO2: 22 mmol/L (ref 22–32)
Calcium: 8.9 mg/dL (ref 8.9–10.3)
Chloride: 105 mmol/L (ref 98–111)
Creatinine, Ser: 2.93 mg/dL — ABNORMAL HIGH (ref 0.61–1.24)
GFR, Estimated: 24 mL/min — ABNORMAL LOW (ref >60)
Glucose, Bld: 119 mg/dL — ABNORMAL HIGH (ref 70–99)
Potassium: 4.3 mmol/L (ref 3.5–5.1)
Sodium: 139 mmol/L (ref 135–145)
Total Bilirubin: 0.6 mg/dL (ref 0.3–1.2)
Total Protein: 7.5 g/dL (ref 6.5–8.1)

## 2020-11-18 LAB — RESP PANEL BY RT-PCR (FLU A&B, COVID) ARPGX2
Influenza A by PCR: NEGATIVE
Influenza B by PCR: NEGATIVE
SARS Coronavirus 2 by RT PCR: NEGATIVE

## 2020-11-18 MED ORDER — MORPHINE SULFATE (PF) 4 MG/ML IV SOLN
4.0000 mg | Freq: Once | INTRAVENOUS | Status: AC
Start: 2020-11-18 — End: 2020-11-18
  Administered 2020-11-18: 4 mg via INTRAVENOUS
  Filled 2020-11-18: qty 1

## 2020-11-18 MED ORDER — MORPHINE SULFATE (PF) 4 MG/ML IV SOLN
4.0000 mg | INTRAVENOUS | Status: DC | PRN
  Administered 2020-11-18 – 2020-11-19 (×6): 4 mg via INTRAVENOUS
  Filled 2020-11-18 (×6): qty 1

## 2020-11-18 MED ORDER — ONDANSETRON HCL 4 MG/2ML IJ SOLN
4.0000 mg | Freq: Once | INTRAMUSCULAR | Status: AC
  Administered 2020-11-18: 4 mg via INTRAVENOUS
  Filled 2020-11-18: qty 2

## 2020-11-18 NOTE — ED Provider Notes (Signed)
[facility] DEPT Provider Note   CSN: [id number] Arrival date & time: 11/18/20  1551     History Chief Complaint  Patient presents with  . Flank Pain    Barry Gibson is a 60 y.o. male hx of bladder cancer s/p bilateral nephrostomy tubes, rheumatoid arthritis, obesity with nephrostomy tube not draining.  Patient states that his left nephrostomy tube stopped draining yesterday.  He states that the right one stopped draining today.  He has worsening bilateral flank pain.  Denies any nausea or vomiting.  He is denies any trauma or injury.  He has bladder cancer and does not urinate from his urethra at baseline.  The history is provided by the patient.       Past Medical History:  Diagnosis Date  . Cancer ([place])    bladder  . Cataract   . COVID-19 11/13/2019   h/a, chills, fatigue, loss of taste and smell, all symptoms resolved in a few weeks  . Frequent urination   . Fuchs' corneal dystrophy   . Full dentures   . GERD (gastroesophageal reflux disease)   . Hypertension   . Rheumatoid arthritis [facility])     Patient Active Problem List   Diagnosis Date Noted  . Abdominal pain 09/24/2020  . History of peptic ulcer 09/24/2020  . Acute lower UTI 09/24/2020  . AKI (acute kidney injury) ([place]) 09/23/2020  . Bladder cancer ([place]) 03/06/2020  . Rheumatoid arthritis ([place]) 04/07/2018  . Prostate hypertrophy 07/15/2017  . Elevated fasting blood sugar 11/05/2016  . Essential hypertension, benign 02/05/2013  . Hyperlipidemia 02/04/2013    Past Surgical History:  Procedure Laterality Date  . CORNEAL TRANSPLANT Left 2013  . CYSTOSCOPY WITH BIOPSY N/A 09/14/2019   Procedure: CYSTOSCOPY WITH BIOPSY;  Surgeon: Franchot Gallo, MD;  Location: [facility];  Service: Urology;  Laterality: N/A;  . FULGURATION OF BLADDER TUMOR N/A 09/14/2019   Procedure: FULGURATION OF BLADDER TUMOR;  Surgeon: Franchot Gallo, MD;  Location: [facility];  Service: Urology;   Laterality: N/A;  . IR NEPHROSTOMY EXCHANGE LEFT  10/30/2020  . IR NEPHROSTOMY EXCHANGE RIGHT  10/30/2020  . IR NEPHROSTOMY PLACEMENT LEFT  09/27/2020  . IR NEPHROSTOMY PLACEMENT RIGHT  09/27/2020  . TRANSURETHRAL RESECTION OF BLADDER TUMOR WITH MITOMYCIN-C N/A 08/05/2019   Procedure: TRANSURETHRAL RESECTION OF BLADDER TUMOR;  Surgeon: Franchot Gallo, MD;  Location: [facility];  Service: Urology;  Laterality: N/A;  1 HR  . TRANSURETHRAL RESECTION OF BLADDER TUMOR WITH MITOMYCIN-C N/A 04/24/2020   Procedure: TRANSURETHRAL RESECTION OF BLADDER TUMOR WITH GEMCIDABINE;  Surgeon: Franchot Gallo, MD;  Location: [facility];  Service: Urology;  Laterality: N/A;       No family history on file.  Social History   Tobacco Use  . Smoking status: Former Smoker    Packs/day: 0.50    Years: 20.00    Pack years: 10.00    Types: Cigarettes  . Smokeless tobacco: Never Used  . Tobacco comment: quit 2010  Vaping Use  . Vaping Use: Never used  Substance Use Topics  . Alcohol use: Yes    Comment: occ  2 x month  . Drug use: No    Home Medications Prior to Admission medications   Medication Sig Start Date End Date Taking? Authorizing Provider  acetaminophen (TYLENOL) 650 MG CR tablet Take 650 mg by mouth at bedtime.   Yes [provider]  amLODipine (NORVASC) 10 MG tablet Take 1 tablet (10 mg total) by  mouth daily. For BP 10/18/20  Yes Taylor, Malena M, DO  leflunomide (ARAVA) 20 MG tablet Take 20 mg by mouth daily. 07/27/19  Yes [provider]  omeprazole (PRILOSEC) 40 MG capsule Take 40 mg by mouth daily.   Yes [provider]  pravastatin (PRAVACHOL) 40 MG tablet Take 1 tablet (40 mg total) by mouth daily. 10/18/20  Yes Lovena Le, Malena M, DO  prednisoLONE acetate (PRED FORTE) 1 % ophthalmic suspension Place 1 drop into both eyes 2 (two) times a week. Instill 1 drop into both eyes Mondays and Friday.   Yes [provider]     Allergies    Ace inhibitors and Diflucan [fluconazole]  Review of Systems   Review of Systems  Genitourinary: Positive for flank pain.  All other systems reviewed and are negative.   Physical Exam Updated Vital Signs BP (!) 155/96   Pulse 81   Temp 97.9 F (36.6 C) (Oral)   Resp 18   Ht 5\' 11"  (1.803 m)   Wt 98 kg   SpO2 92%   BMI 30.13 kg/m   Physical Exam Vitals and nursing note reviewed.  Constitutional:      Comments: Uncomfortable, chronically ill.  HENT:     Head: Normocephalic.     Right Ear: Tympanic membrane normal.     Left Ear: Tympanic membrane normal.     Nose: Nose normal.     Mouth/Throat:     Mouth: Mucous membranes are moist.  Eyes:     Extraocular Movements: Extraocular movements intact.     Pupils: Pupils are equal, round, and reactive to light.  Cardiovascular:     Rate and Rhythm: Normal rate and regular rhythm.     Pulses: Normal pulses.     Heart sounds: Normal heart sounds.  Pulmonary:     Effort: Pulmonary effort is normal.     Breath sounds: Normal breath sounds.  Abdominal:     General: Abdomen is flat.     Palpations: Abdomen is soft.     Comments: Bilateral nephrostomy tube is sutured down and there is no signs of trauma around it.  Patient has bilateral CVA tenderness  Musculoskeletal:        General: Normal range of motion.     Cervical back: Normal range of motion and neck supple.  Skin:    General: Skin is warm.     Capillary Refill: Capillary refill takes less than 2 seconds.  Neurological:     General: No focal deficit present.     Mental Status: He is oriented to person, place, and time.  Psychiatric:        Mood and Affect: Mood normal.        Behavior: Behavior normal.     ED Results / Procedures / Treatments   Labs (all labs ordered are listed, but only abnormal results are displayed) Labs Reviewed  CBC WITH DIFFERENTIAL/PLATELET - Abnormal; Notable for the following components:      Result Value   WBC  10.9 (*)    RBC 3.99 (*)    Hemoglobin 11.8 (*)    HCT 36.5 (*)    Neutro Abs 7.9 (*)    All other components within normal limits  COMPREHENSIVE METABOLIC PANEL - Abnormal; Notable for the following components:   Glucose, Bld 119 (*)    BUN 28 (*)    Creatinine, Ser 2.93 (*)    GFR, Estimated 24 (*)    All other components within normal limits  URINALYSIS, ROUTINE W REFLEX MICROSCOPIC - Abnormal; Notable for the following components:   APPearance CLOUDY (*)    Hgb urine dipstick MODERATE (*)    Protein, ur 100 (*)    Leukocytes,Ua LARGE (*)    WBC, UA >50 (*)    Bacteria, UA RARE (*)    All other components within normal limits  RESP PANEL BY RT-PCR (FLU A&B, COVID) [id number]  URINE CULTURE    EKG None  Radiology CT Renal Stone Study  Result Date: 11/18/2020 CLINICAL DATA:  Bilateral flank pain since 2 p.m. History of bladder cancer. Bilateral nephrostomy tubes with decreased output. EXAM: CT ABDOMEN AND PELVIS WITHOUT CONTRAST TECHNIQUE: Multidetector CT imaging of the abdomen and pelvis was performed following the standard protocol without IV contrast. COMPARISON:  09/23/2020 FINDINGS: Lower chest: The lung bases are clear. Hepatobiliary: Cholelithiasis with single stone in the gallbladder. No inflammatory changes. Homogeneous appearance of the liver. Pancreas: Unremarkable. No pancreatic ductal dilatation or surrounding inflammatory changes. Spleen: Normal in size without focal abnormality. Adrenals/Urinary Tract: No adrenal gland nodules. Bilateral nephrostomy tubes are present. The nephrostomy tube tips bilaterally are shallow, demonstrated in the subcutaneous fat posterior to the kidneys. This suggests that the tubes have retracted. Nephrostomy tube tips are not draining the collecting systems. There is bilateral hydronephrosis and hydroureter with stranding around both kidneys. No ureteral stones. The bladder is completely decompressed with diffusely thickened wall and with  infiltration around the bladder. This may correspond to the known bladder cancer or may be related to radiation changes depending on the treatment. Stomach/Bowel: Stomach, small bowel, and colon are not abnormally distended. Scattered stool in the colon. No wall thickening or inflammatory changes. Sigmoid colonic diverticula without evidence of diverticulitis. The appendix is normal. Vascular/Lymphatic: Calcification of the aorta consistent with aortic atherosclerosis. Right iliac chain pelvic lymphadenopathy with lymph nodes measuring up to about 1.9 cm short axis dimension. Mild enlargement since previous study. Reproductive: Prostate is unremarkable. Other: No abdominal wall hernia or abnormality. No abdominopelvic ascites. Musculoskeletal: No acute or significant osseous findings. IMPRESSION: 1. Bilateral nephrostomy tubes have retracted out of the kidneys and are not draining the collecting systems. 2. Bilateral hydronephrosis and hydroureter with stranding around both kidneys. No ureteral stones. 3. The bladder is completely decompressed with diffusely thickened wall and with infiltration around the bladder. This may correspond to the known bladder cancer or may be related to radiation changes depending on the treatment. 4. Right iliac chain pelvic lymphadenopathy, increasing since previous study. 5. Cholelithiasis without evidence of cholecystitis. 6. Sigmoid colonic diverticulosis without evidence of diverticulitis. 7. Aortic atherosclerosis. Aortic Atherosclerosis (ICD10-I70.0). Electronically Signed   By: Lucienne Capers M.D.   On: 11/18/2020 19:10    Procedures Procedures (including critical care time)  Medications Ordered in ED Medications  morphine 4 MG/ML injection 4 mg (has no administration in time range)  morphine 4 MG/ML injection 4 mg (4 mg Intravenous Given 11/18/20 1824)  ondansetron (ZOFRAN) injection 4 mg (4 mg Intravenous Given 11/18/20 1825)    ED Course  I have reviewed the  triage vital signs and the nursing notes.  Pertinent labs & imaging results that were available during my care of the patient were reviewed by me and considered in my medical decision making (see chart for details).    MDM Rules/Calculators/A&P                         Barry Gibson is a 60 y.o. male  here presenting with flank pain and nephrostomy tube not draining.  Urologist, Dr. Benancio Deeds called me and notified me about the patient. He recommend CT renal stone and basic blood work and if the nephrostomy tube needs to be replaced, we need to consult IR  8 pm Creatinine went up to 2.9 from 2.  However his creatinine was 4 previously for his nephrostomy tube exchange.  I discussed with Dr. Roxy Horseman from IR.  He states that he will do the nephrostomy tube exchange in the morning.  Ordered pain medicine for pain control.  His urine likely is colonized and I sent off urine culture and he has no fever or white count to suggest UTI.  Will hold off antibiotics for now.  Patient likely will be discharged after IR tube exchange tomorrow.   Final Clinical Impression(s) / ED Diagnoses Final diagnoses:  Nephrostomy complication [facility])    Rx / DC Orders ED Discharge Orders    None       Charlynne Pander, MD 11/18/20 2154

## 2020-11-18 NOTE — ED Triage Notes (Signed)
Pt noticed decreased output from his nephrostomy tubes and began having bilateral flank pain at 2PM this afternoon.

## 2020-11-19 ENCOUNTER — Emergency Department (HOSPITAL_COMMUNITY): Payer: BC Managed Care – PPO

## 2020-11-19 HISTORY — PX: IR NEPHROSTOMY EXCHANGE LEFT: IMG6069

## 2020-11-19 HISTORY — PX: IR NEPHROSTOMY EXCHANGE RIGHT: IMG6070

## 2020-11-19 LAB — PROTIME-INR
INR: 1.1 (ref 0.8–1.2)
Prothrombin Time: 13.9 seconds (ref 11.4–15.2)

## 2020-11-19 MED ORDER — LIDOCAINE HCL 1 % IJ SOLN
INTRAMUSCULAR | Status: AC | PRN
Start: 2020-11-19 — End: 2020-11-19
  Administered 2020-11-19: 5 mL

## 2020-11-19 MED ORDER — FENTANYL CITRATE (PF) 100 MCG/2ML IJ SOLN
INTRAMUSCULAR | Status: AC
  Filled 2020-11-19: qty 2

## 2020-11-19 MED ORDER — MIDAZOLAM HCL 2 MG/2ML IJ SOLN
INTRAMUSCULAR | Status: AC | PRN
  Administered 2020-11-19 (×2): 1 mg via INTRAVENOUS

## 2020-11-19 MED ORDER — IOHEXOL 300 MG/ML  SOLN
50.0000 mL | Freq: Once | INTRAMUSCULAR | Status: AC | PRN
  Administered 2020-11-19: 20 mL

## 2020-11-19 MED ORDER — FENTANYL CITRATE (PF) 100 MCG/2ML IJ SOLN
INTRAMUSCULAR | Status: AC | PRN
  Administered 2020-11-19 (×2): 25 ug via INTRAVENOUS

## 2020-11-19 MED ORDER — CEFAZOLIN SODIUM-DEXTROSE 2-4 GM/100ML-% IV SOLN
INTRAVENOUS | Status: AC
  Administered 2020-11-19: 2000 mg
  Filled 2020-11-19: qty 100

## 2020-11-19 MED ORDER — MIDAZOLAM HCL 2 MG/2ML IJ SOLN
INTRAMUSCULAR | Status: AC
  Filled 2020-11-19: qty 2

## 2020-11-19 MED ORDER — LIDOCAINE HCL 1 % IJ SOLN
INTRAMUSCULAR | Status: AC
  Filled 2020-11-19: qty 20

## 2020-11-19 NOTE — ED Notes (Signed)
Pt's O2 sats dropped to 88%, placed on 2 L O2 nasal cannula. Sats now 94%

## 2020-11-19 NOTE — Discharge Instructions (Addendum)
You were seen in the emergency department for a blocked nephrostomy tubes.  Interventional radiology replaced both her tubes and they appear to be functioning well.  Return to the emergency department if any worsening or concerning symptoms.

## 2020-11-19 NOTE — ED Provider Notes (Signed)
Signout from Dr. Elesa Massed.  60 year old male with bilateral nephrostomy tubes that are not functioning.  Awaiting interventional radiology this morning. Physical Exam  BP (!) 153/96   Pulse 92   Temp 97.9 F (36.6 C) (Oral)   Resp 13   Ht 5\' 11"  (1.803 m)   Wt 98 kg   SpO2 97%   BMI 30.13 kg/m   Physical Exam  ED Course/Procedures     Procedures  MDM  11:30 AM.  Patient is returned from IR.  Has 2 functioning nephrostomy tubes.  Room air sats 97%.  Has ambulated in the department without any difficulties.  Will discharge       , MD 11/20/20 1012

## 2020-11-19 NOTE — ED Provider Notes (Signed)
11:30 PM  Assumed care.  Patient waiting in ED overnight to have nephrostomy tube replaced by IR in AM.   Gunda Maqueda, Layla Maw, DO 11/19/20 0013

## 2020-11-19 NOTE — Procedures (Signed)
Interventional Radiology Procedure Note  Procedure: Bilateral nephrostomy tube replacement under fluoro  Complications: None  Estimated Blood Loss: None  Findings: Both PCN tracts able to be catheterized back to renal collecting systems to allow replacement of bilateral 10 Fr nephrostomy tubes. Both formed in renal pelvis bilaterally and connected to gravity bags.   Jodi Marble. Fredia Sorrow, M.D Pager:  [phone]

## 2020-11-19 NOTE — ED Notes (Signed)
Pt ambulatory to restroom with no issues noted.

## 2020-11-20 LAB — URINE CULTURE

## 2020-11-28 ENCOUNTER — Other Ambulatory Visit: Payer: Self-pay | Admitting: Urology

## 2020-11-28 ENCOUNTER — Other Ambulatory Visit: Payer: Self-pay

## 2020-11-28 ENCOUNTER — Inpatient Hospital Stay (HOSPITAL_COMMUNITY)
Admission: AD | Admit: 2020-11-28 | Discharge: 2020-12-04 | DRG: 654 | Disposition: A | Discharge status: Home / Self Care | Payer: BC Managed Care – PPO | Attending: Urology | Admitting: Urology

## 2020-11-28 DIAGNOSIS — C679 Malignant neoplasm of bladder, unspecified: Secondary | ICD-10-CM | POA: Diagnosis present

## 2020-11-28 DIAGNOSIS — K660 Peritoneal adhesions (postprocedural) (postinfection): Secondary | ICD-10-CM | POA: Diagnosis present

## 2020-11-28 DIAGNOSIS — I898 Other specified noninfective disorders of lymphatic vessels and lymph nodes: Secondary | ICD-10-CM | POA: Diagnosis present

## 2020-11-28 DIAGNOSIS — M069 Rheumatoid arthritis, unspecified: Secondary | ICD-10-CM | POA: Diagnosis present

## 2020-11-28 DIAGNOSIS — Z79899 Other long term (current) drug therapy: Secondary | ICD-10-CM

## 2020-11-28 DIAGNOSIS — Z947 Corneal transplant status: Secondary | ICD-10-CM | POA: Diagnosis not present

## 2020-11-28 DIAGNOSIS — Z87891 Personal history of nicotine dependence: Secondary | ICD-10-CM | POA: Diagnosis not present

## 2020-11-28 DIAGNOSIS — N1339 Other hydronephrosis: Secondary | ICD-10-CM | POA: Diagnosis present

## 2020-11-28 DIAGNOSIS — C676 Malignant neoplasm of ureteric orifice: Principal | ICD-10-CM | POA: Diagnosis present

## 2020-11-28 DIAGNOSIS — Z888 Allergy status to other drugs, medicaments and biological substances status: Secondary | ICD-10-CM | POA: Diagnosis not present

## 2020-11-28 DIAGNOSIS — I10 Essential (primary) hypertension: Secondary | ICD-10-CM | POA: Diagnosis present

## 2020-11-28 DIAGNOSIS — Z20822 Contact with and (suspected) exposure to covid-19: Secondary | ICD-10-CM | POA: Diagnosis present

## 2020-11-28 DIAGNOSIS — K219 Gastro-esophageal reflux disease without esophagitis: Secondary | ICD-10-CM | POA: Diagnosis present

## 2020-11-28 DIAGNOSIS — Z8616 Personal history of COVID-19: Secondary | ICD-10-CM | POA: Diagnosis not present

## 2020-11-28 LAB — SURGICAL PCR SCREEN
MRSA, PCR: NEGATIVE
Staphylococcus aureus: NEGATIVE

## 2020-11-28 LAB — COMPREHENSIVE METABOLIC PANEL
ALT: 12 U/L (ref 0–44)
AST: 14 U/L — ABNORMAL LOW (ref 15–41)
Albumin: 3.7 g/dL (ref 3.5–5.0)
Alkaline Phosphatase: 49 U/L (ref 38–126)
Anion gap: 10 (ref 5–15)
BUN: 16 mg/dL (ref 6–20)
CO2: 24 mmol/L (ref 22–32)
Calcium: 8.9 mg/dL (ref 8.9–10.3)
Chloride: 105 mmol/L (ref 98–111)
Creatinine, Ser: 1.27 mg/dL — ABNORMAL HIGH (ref 0.61–1.24)
GFR, Estimated: >60 mL/min (ref >60)
Glucose, Bld: 89 mg/dL (ref 70–99)
Potassium: 3.3 mmol/L — ABNORMAL LOW (ref 3.5–5.1)
Sodium: 139 mmol/L (ref 135–145)
Total Bilirubin: 0.6 mg/dL (ref 0.3–1.2)
Total Protein: 7.8 g/dL (ref 6.5–8.1)

## 2020-11-28 LAB — CBC
HCT: 38.2 % — ABNORMAL LOW (ref 39.0–52.0)
Hemoglobin: 12.5 g/dL — ABNORMAL LOW (ref 13.0–17.0)
MCH: 29.8 pg (ref 26.0–34.0)
MCHC: 32.7 g/dL (ref 30.0–36.0)
MCV: 91 fL (ref 80.0–100.0)
Platelets: 421 10*3/uL — ABNORMAL HIGH (ref 150–400)
RBC: 4.2 MIL/uL — ABNORMAL LOW (ref 4.22–5.81)
RDW: 13.5 % (ref 11.5–15.5)
WBC: 8.1 10*3/uL (ref 4.0–10.5)
nRBC: 0 % (ref 0.0–0.2)

## 2020-11-28 LAB — SARS CORONAVIRUS 2 BY RT PCR (HOSPITAL ORDER, PERFORMED IN ~~LOC~~ HOSPITAL LAB): SARS Coronavirus 2: NEGATIVE

## 2020-11-28 LAB — PREPARE RBC (CROSSMATCH)

## 2020-11-28 MED ORDER — METRONIDAZOLE 500 MG PO TABS
500.0000 mg | ORAL_TABLET | ORAL | Status: AC
  Administered 2020-11-28 (×2): 500 mg via ORAL
  Filled 2020-11-28 (×2): qty 1

## 2020-11-28 MED ORDER — LEFLUNOMIDE 20 MG PO TABS
20.0000 mg | ORAL_TABLET | Freq: Every day | ORAL | Status: DC
Start: 2020-11-29 — End: 2020-12-04
  Administered 2020-11-30 – 2020-12-03 (×4): 20 mg via ORAL
  Filled 2020-11-28 (×6): qty 1

## 2020-11-28 MED ORDER — MUPIROCIN 2 % EX OINT
1.0000 "application " | TOPICAL_OINTMENT | Freq: Two times a day (BID) | CUTANEOUS | Status: DC
  Administered 2020-11-28 – 2020-11-29 (×2): 1 via NASAL
  Filled 2020-11-28: qty 22

## 2020-11-28 MED ORDER — AMLODIPINE BESYLATE 5 MG PO TABS
5.0000 mg | ORAL_TABLET | Freq: Every day | ORAL | Status: DC
  Administered 2020-11-30 – 2020-12-04 (×5): 5 mg via ORAL
  Filled 2020-11-28 (×8): qty 1

## 2020-11-28 MED ORDER — CEFAZOLIN SODIUM-DEXTROSE 2-4 GM/100ML-% IV SOLN: 2.0000 g | INTRAVENOUS | Status: DC

## 2020-11-28 MED ORDER — SODIUM CHLORIDE 0.9 % IV SOLN: INTRAVENOUS | Status: DC

## 2020-11-28 MED ORDER — PIPERACILLIN-TAZOBACTAM 3.375 G IVPB
3.3750 g | Freq: Three times a day (TID) | INTRAVENOUS | Status: AC
Start: 2020-11-29 — End: 2020-11-30
  Administered 2020-11-29 (×2): 3.375 g via INTRAVENOUS
  Filled 2020-11-28 (×2): qty 50

## 2020-11-28 MED ORDER — PRAVASTATIN SODIUM 40 MG PO TABS
40.0000 mg | ORAL_TABLET | Freq: Every day | ORAL | Status: DC
  Administered 2020-11-30 – 2020-12-04 (×5): 40 mg via ORAL
  Filled 2020-11-28 (×5): qty 1

## 2020-11-28 MED ORDER — NEOMYCIN SULFATE 500 MG PO TABS
1000.0000 mg | ORAL_TABLET | ORAL | Status: AC
  Administered 2020-11-28 (×2): 1000 mg via ORAL
  Filled 2020-11-28 (×3): qty 2

## 2020-11-28 MED ORDER — PREDNISOLONE ACETATE 1 % OP SUSP
1.0000 [drp] | OPHTHALMIC | Status: DC
  Administered 2020-12-02: 1 [drp] via OPHTHALMIC
  Filled 2020-11-28 (×2): qty 5

## 2020-11-28 MED ORDER — PANTOPRAZOLE SODIUM 40 MG PO TBEC
40.0000 mg | DELAYED_RELEASE_TABLET | Freq: Every day | ORAL | Status: DC
  Administered 2020-11-30 – 2020-12-04 (×5): 40 mg via ORAL
  Filled 2020-11-28 (×5): qty 1

## 2020-11-28 MED ORDER — PEG 3350-KCL-NA BICARB-NACL 420 G PO SOLR
4000.0000 mL | Freq: Once | ORAL | Status: AC
  Administered 2020-11-28: 4000 mL via ORAL

## 2020-11-28 MED ORDER — ALVIMOPAN 12 MG PO CAPS
12.0000 mg | ORAL_CAPSULE | ORAL | Status: AC
  Administered 2020-11-29: 12 mg via ORAL
  Filled 2020-11-28: qty 1

## 2020-11-28 NOTE — Progress Notes (Signed)
Pharmacy Antibiotic Note  Barry Gibson is a 60 y.o. male admitted on 11/28/2020 for planned cystoprostatectomy.  Pharmacy has been consulted for 24 hr of Zosyn dosing. First dose to begin on 1/12 at 0700, continue x 24 hr.  Plan: Zosyn 3.375gm q8 x 3 doses Renal fx wnl ~ 75 ml/min  Height: 5\' 11"  (180.3 cm) Weight: 95.8 kg (211 lb 4.8 oz) IBW/kg (Calculated) : 75.3  Temp (24hrs), Avg:97.9 F (36.6 C), Min:97.9 F (36.6 C), Max:97.9 F (36.6 C)  Recent Labs  Lab 11/28/20 1642  WBC 8.1  CREATININE 1.27*    Estimated Creatinine Clearance: 74.9 mL/min (A) (by C-G formula based on SCr of 1.27 mg/dL (H)).    Allergies  Allergen Reactions  . Ace Inhibitors Cough  . Diflucan [Fluconazole] Other (See Comments)    Irritated ulcers    Antimicrobials this admission: 1/12 Zosyn >> 1/12  Microbiology results: 1/11 Surg PCR screen: neg/neg  Thank you for allowing pharmacy to be a part of this patient's care.  Minda Ditto PharmD 11/28/2020 7:22 PM

## 2020-11-28 NOTE — Anesthesia Preprocedure Evaluation (Addendum)
Anesthesia Evaluation  Patient identified by MRN, date of birth, ID band Patient awake    Reviewed: Allergy & Precautions, NPO status , Patient's Chart, lab work & pertinent test results  Airway Mallampati: II  TM Distance: >3 FB Neck ROM: Full    Dental no notable dental hx. (+) Edentulous Lower, Edentulous Upper   Pulmonary former smoker,    Pulmonary exam normal breath sounds clear to auscultation       Cardiovascular hypertension, Pt. on medications Normal cardiovascular exam Rhythm:Regular Rate:Normal  09/23/20 EKG  SB R51 w NSST Changes   Neuro/Psych negative neurological ROS  negative psych ROS   GI/Hepatic Neg liver ROS, PUD,   Endo/Other  negative endocrine ROS  Renal/GU CRFRenal diseaseBladder CA  K+ 4.3 Cr 2.33     Musculoskeletal  (+) Arthritis ,   Abdominal   Peds  Hematology negative hematology ROS (+) Hgb 12.5 Plt 421   Anesthesia Other Findings All: Diflucan, AcE  Reproductive/Obstetrics                            Anesthesia Physical Anesthesia Plan  ASA: III  Anesthesia Plan: General   Post-op Pain Management:    Induction: Intravenous  PONV Risk Score and Plan: 3 and Treatment may vary due to age or medical condition, Ondansetron, Midazolam and Dexamethasone  Airway Management Planned: Oral ETT  Additional Equipment: None  Intra-op Plan:   Post-operative Plan: Extubation in OR  Informed Consent: I have reviewed the patients History and Physical, chart, labs and discussed the procedure including the risks, benefits and alternatives for the proposed anesthesia with the patient or authorized representative who has indicated his/her understanding and acceptance.     Dental advisory given  Plan Discussed with: CRNA and Anesthesiologist  Anesthesia Plan Comments: (2 large IVs 18 or greater  GA w Lidociane infusion)       Anesthesia Quick  Evaluation

## 2020-11-28 NOTE — H&P (Signed)
Barry Gibson is an 60 y.o. male.    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 Recurrence 04/2020 T1G3 with muscle in specimen and then new bilateral malignant hydro on CT 09/2020 (clinical stage 3).   2 - Malignant Hydronephrosis - new bilateral moderate hydro to bladder on restaging CT 09/2020. Neph tubes placed 09/2020. Cr peak 4.8, Baseline Cr 1.1. Most recent UCX non-clonal. Neph tubes excahnged 11/19/20 as became dislodged.    3 - Prostate Screening - PSA 0.7 2019.    PMH sig for RA (Humira, follows Barry Gibson Rheumatoid), congenital L hand deformity. NO CV disease / blood thinners. He works in Investment banker, corporate for [employer]. His wife "Barry Gibson" is very involved. HIs PCP is Barry Lange MD   Today " Barry Gibson " is seen as pre-op admission for labs, stomal marking, bowel prep before curatei intent cystoprostatectomy tomorrow. No interval fevers. Most recent CT 1/122 with some Rt iliac adenopathy.     Past Medical History:  Diagnosis Date  . Cancer ([place])    bladder  . Cataract   . COVID-19 11/13/2019   h/a, chills, fatigue, loss of taste and smell, all symptoms resolved in a few weeks  . Frequent urination   . Fuchs' corneal dystrophy   . Full dentures   . GERD (gastroesophageal reflux disease)   . Hypertension   . Rheumatoid arthritis [facility])     Past Surgical History:  Procedure Laterality Date  . CORNEAL TRANSPLANT Left 2013  . CYSTOSCOPY WITH BIOPSY N/A 09/14/2019   Procedure: CYSTOSCOPY WITH BIOPSY;  Surgeon: Franchot Gallo, MD;  Location: [facility];  Service: Urology;  Laterality: N/A;  . FULGURATION OF BLADDER TUMOR N/A 09/14/2019   Procedure: FULGURATION OF BLADDER TUMOR;  Surgeon: Franchot Gallo, MD;  Location: [facility];  Service: Urology;  Laterality: N/A;  . IR NEPHROSTOMY EXCHANGE LEFT  10/30/2020  . IR NEPHROSTOMY EXCHANGE LEFT   11/19/2020  . IR NEPHROSTOMY EXCHANGE RIGHT  10/30/2020  . IR NEPHROSTOMY EXCHANGE RIGHT  11/19/2020  . IR NEPHROSTOMY PLACEMENT LEFT  09/27/2020  . IR NEPHROSTOMY PLACEMENT RIGHT  09/27/2020  . TRANSURETHRAL RESECTION OF BLADDER TUMOR WITH MITOMYCIN-C N/A 08/05/2019   Procedure: TRANSURETHRAL RESECTION OF BLADDER TUMOR;  Surgeon: Franchot Gallo, MD;  Location: [facility];  Service: Urology;  Laterality: N/A;  1 HR  . TRANSURETHRAL RESECTION OF BLADDER TUMOR WITH MITOMYCIN-C N/A 04/24/2020   Procedure: TRANSURETHRAL RESECTION OF BLADDER TUMOR WITH GEMCIDABINE;  Surgeon: Franchot Gallo, MD;  Location: [facility];  Service: Urology;  Laterality: N/A;    No family history on file. Social History:  reports that he has quit smoking. His smoking use included cigarettes. He has a 10.00 pack-year smoking history. He has never used smokeless tobacco. He reports current alcohol use. He reports that he does not use drugs.  Allergies:  Allergies  Allergen Reactions  . Ace Inhibitors Cough  . Diflucan [Fluconazole] Other (See Comments)    Irritated ulcers    Medications Prior to Admission  Medication Sig Dispense Refill  . acetaminophen (TYLENOL) 650 MG CR tablet Take 1,300 mg by mouth every morning.    Marland Kitchen amLODipine (NORVASC) 10 MG tablet Take 1 tablet (10 mg total) by mouth daily. For BP 90 tablet 1  . leflunomide (ARAVA) 20 MG tablet Take 20 mg by mouth daily.    Marland Kitchen  omeprazole (PRILOSEC) 40 MG capsule Take 40 mg by mouth daily.    . pravastatin (PRAVACHOL) 40 MG tablet Take 1 tablet (40 mg total) by mouth daily. 90 tablet 1  . prednisoLONE acetate (PRED FORTE) 1 % ophthalmic suspension Place 1 drop into the left eye 2 (two) times a week. Instill 1 drop into left eye Monday and Friday.      No results found for this or any previous visit (from the past 48 hour(s)). No results found.  Review of Systems  Constitutional: Negative for chills and fever.  All other  systems reviewed and are negative.   Blood pressure (!) 145/95, pulse 85, temperature 97.9 F (36.6 C), temperature source Oral, resp. rate 18, height 5\' 11"  (1.803 Barry), weight 95.8 kg, SpO2 97 %. Physical Exam Vitals reviewed.  Constitutional:      Comments: Wife Barry Gibson at bedside  HENT:     Mouth/Throat:     Mouth: Mucous membranes are moist.  Eyes:     Pupils: Pupils are equal, round, and reactive to light.  Cardiovascular:     Rate and Rhythm: Normal rate.     Pulses: Normal pulses.  Pulmonary:     Effort: Pulmonary effort is normal.  Abdominal:     General: Abdomen is flat.  Genitourinary:    Penis: Normal.      Comments: Bilateral neph tubes in place with non-foul urine.  Musculoskeletal:     Cervical back: Normal range of motion.     Comments: Stable LUE hand hypoplasia.   Neurological:     Mental Status: He is alert.  Psychiatric:        Mood and Affect: Mood normal.      Assessment/Plan  CMP, CBC, T+C 2 units, entereg, NS at 50, bowel prep, stomal marking in preparation for surgery tomorrow. Risks, benefits, alternatives, expected peri-op course including abotu 7 day hospital stay, home with home health even if uneventful course discussed in detail. Specific risks incluidng non-cure, bowel leak, bowel obstruction, and mortality frankly discussed.   Alexis Frock, MD 11/28/2020, 4:53 PM

## 2020-11-28 NOTE — Consult Note (Addendum)
[facility] Nurse requested for preoperative stoma site marking  Discussed surgical procedure and stoma creation with patient and family. Wife, [name] a bedside and very supportive.  Explained role of the [facility] nurse team.  Provided the patient with educational booklet and provided samples of pouching options.  Answered patient and family questions.   Examined patient lying, sitting, and standing in order to place the marking in the patient's visual field, away from any creases or abdominal contour issues and within the rectus muscle.  Attempted to mark below the patient's belt line.     Marked for ileal conduit in the RLQ 3  cm to the right of the umbilicus and  4 cm above  the umbilicus.  Patient has very low umblicus and wears pants below umbilical line.  I do not think a low marking would create an ideal pouching surface.  Patient and wife enjoy swimming and beach life. We discussed continuing these activities and life with an ostomy. They are relieved to have questions answered. I have written materials at bedside. Patient abdomen is hairy and we discuss ongoing shaving for optimal pouch wear time. He understands and agrees.    Patient's abdomen cleansed with CHG wipes at site markings, allowed to air dry prior to marking.Covered mark with thin film transparent dressing to preserve mark until date of surgery.   [facility] Nurse team will follow up with patient after surgery for continue ostomy care and teaching.   Domenic Moras MSN, RN, FNP-BC CWON Wound, Ostomy, Continence Nurse Pager [phone]

## 2020-11-28 NOTE — Plan of Care (Signed)
  Problem: Education: Goal: Knowledge of General Education information will improve Description: Including pain rating scale, medication(s)/side effects and non-pharmacologic comfort measures Outcome: Progressing   Problem: Health Behavior/Discharge Planning: Goal: Ability to manage health-related needs will improve Outcome: Progressing   Problem: Coping: Goal: Level of anxiety will decrease Outcome: Progressing   Problem: Elimination: Goal: Will not experience complications related to bowel motility Outcome: Progressing Goal: Will not experience complications related to urinary retention Outcome: Progressing   Problem: Pain Managment: Goal: General experience of comfort will improve Outcome: Progressing   Problem: Safety: Goal: Ability to remain free from injury will improve Outcome: Progressing   Problem: Skin Integrity: Goal: Risk for impaired skin integrity will decrease Outcome: Progressing   

## 2020-11-29 ENCOUNTER — Inpatient Hospital Stay (HOSPITAL_COMMUNITY): Payer: BC Managed Care – PPO | Admitting: Certified Registered"

## 2020-11-29 ENCOUNTER — Encounter (HOSPITAL_COMMUNITY)
Admission: AD | Disposition: A | Discharge status: Home / Self Care | Payer: Self-pay | Source: Home / Self Care | Attending: Urology

## 2020-11-29 ENCOUNTER — Encounter (HOSPITAL_COMMUNITY): Payer: Self-pay | Admitting: Urology

## 2020-11-29 HISTORY — PX: ROBOT ASSISTED LAPAROSCOPIC COMPLETE CYSTECT ILEAL CONDUIT: SHX5139

## 2020-11-29 HISTORY — PX: CYSTOSCOPY WITH INJECTION: SHX1424

## 2020-11-29 LAB — ABO/RH: ABO/RH(D): O POS

## 2020-11-29 LAB — HEMOGLOBIN AND HEMATOCRIT, BLOOD
HCT: 35.1 % — ABNORMAL LOW (ref 39.0–52.0)
Hemoglobin: 11 g/dL — ABNORMAL LOW (ref 13.0–17.0)

## 2020-11-29 SURGERY — CYSTECTOMY, ROBOT-ASSISTED, WITH ILEAL CONDUIT CREATION
Anesthesia: General | Site: Abdomen

## 2020-11-29 MED ORDER — PROPOFOL 10 MG/ML IV BOLUS
INTRAVENOUS | Status: AC
  Filled 2020-11-29: qty 40

## 2020-11-29 MED ORDER — CHLORHEXIDINE GLUCONATE 0.12 % MT SOLN
15.0000 mL | OROMUCOSAL | Status: AC
  Administered 2020-11-29: 15 mL via OROMUCOSAL

## 2020-11-29 MED ORDER — WATER FOR IRRIGATION, STERILE IR SOLN
Status: DC | PRN
  Administered 2020-11-29: 1000 mL via SURGICAL_CAVITY

## 2020-11-29 MED ORDER — PHENYLEPHRINE 40 MCG/ML (10ML) SYRINGE FOR IV PUSH (FOR BLOOD PRESSURE SUPPORT)
PREFILLED_SYRINGE | INTRAVENOUS | Status: DC | PRN
  Administered 2020-11-29 (×5): 40 ug via INTRAVENOUS

## 2020-11-29 MED ORDER — MIDAZOLAM HCL 2 MG/2ML IJ SOLN
INTRAMUSCULAR | Status: AC
  Filled 2020-11-29: qty 2

## 2020-11-29 MED ORDER — ACETAMINOPHEN 10 MG/ML IV SOLN: 1000.0000 mg | Freq: Once | INTRAVENOUS | Status: DC | PRN

## 2020-11-29 MED ORDER — ONDANSETRON HCL 4 MG/2ML IJ SOLN: 4.0000 mg | INTRAMUSCULAR | Status: DC | PRN

## 2020-11-29 MED ORDER — ALVIMOPAN 12 MG PO CAPS
12.0000 mg | ORAL_CAPSULE | Freq: Two times a day (BID) | ORAL | Status: DC
  Administered 2020-11-30 – 2020-12-02 (×5): 12 mg via ORAL
  Filled 2020-11-29 (×5): qty 1

## 2020-11-29 MED ORDER — MIDAZOLAM HCL 2 MG/2ML IJ SOLN
INTRAMUSCULAR | Status: DC | PRN
  Administered 2020-11-29: 2 mg via INTRAVENOUS

## 2020-11-29 MED ORDER — ONDANSETRON HCL 4 MG/2ML IJ SOLN
INTRAMUSCULAR | Status: DC | PRN
  Administered 2020-11-29: 4 mg via INTRAVENOUS

## 2020-11-29 MED ORDER — DIPHENHYDRAMINE HCL 50 MG/ML IJ SOLN: 12.5000 mg | Freq: Four times a day (QID) | INTRAMUSCULAR | Status: DC | PRN

## 2020-11-29 MED ORDER — ROCURONIUM BROMIDE 10 MG/ML (PF) SYRINGE
PREFILLED_SYRINGE | INTRAVENOUS | Status: AC
  Filled 2020-11-29: qty 10

## 2020-11-29 MED ORDER — HYDROMORPHONE HCL 1 MG/ML IJ SOLN
0.5000 mg | INTRAMUSCULAR | Status: DC | PRN
  Administered 2020-11-29: 0.5 mg via INTRAVENOUS
  Administered 2020-11-30 – 2020-12-02 (×9): 1 mg via INTRAVENOUS
  Administered 2020-12-03 (×3): 0.5 mg via INTRAVENOUS
  Filled 2020-11-29 (×13): qty 1

## 2020-11-29 MED ORDER — PROPOFOL 10 MG/ML IV BOLUS
INTRAVENOUS | Status: DC | PRN
  Administered 2020-11-29: 200 mg via INTRAVENOUS

## 2020-11-29 MED ORDER — PHENYLEPHRINE 40 MCG/ML (10ML) SYRINGE FOR IV PUSH (FOR BLOOD PRESSURE SUPPORT)
PREFILLED_SYRINGE | INTRAVENOUS | Status: AC
  Filled 2020-11-29: qty 10

## 2020-11-29 MED ORDER — KETAMINE HCL 10 MG/ML IJ SOLN
INTRAMUSCULAR | Status: DC | PRN
  Administered 2020-11-29 (×2): 20 mg via INTRAVENOUS

## 2020-11-29 MED ORDER — SUGAMMADEX SODIUM 200 MG/2ML IV SOLN
INTRAVENOUS | Status: DC | PRN
  Administered 2020-11-29: 200 mg via INTRAVENOUS

## 2020-11-29 MED ORDER — ONDANSETRON HCL 4 MG/2ML IJ SOLN: 4.0000 mg | Freq: Once | INTRAMUSCULAR | Status: DC | PRN

## 2020-11-29 MED ORDER — PHENYLEPHRINE HCL-NACL 10-0.9 MG/250ML-% IV SOLN
INTRAVENOUS | Status: DC | PRN
  Administered 2020-11-29: 30 ug/min via INTRAVENOUS

## 2020-11-29 MED ORDER — KETAMINE HCL 10 MG/ML IJ SOLN
INTRAMUSCULAR | Status: AC
  Filled 2020-11-29: qty 1

## 2020-11-29 MED ORDER — PHENYLEPHRINE HCL (PRESSORS) 10 MG/ML IV SOLN
INTRAVENOUS | Status: AC
  Filled 2020-11-29: qty 1

## 2020-11-29 MED ORDER — ADULT MULTIVITAMIN W/MINERALS CH: 1.0000 | ORAL_TABLET | Freq: Every day | ORAL | Status: DC

## 2020-11-29 MED ORDER — MEPERIDINE HCL 50 MG/ML IJ SOLN
6.2500 mg | INTRAMUSCULAR | Status: DC | PRN
Start: 2020-11-29 — End: 2020-11-29

## 2020-11-29 MED ORDER — AMISULPRIDE (ANTIEMETIC) 5 MG/2ML IV SOLN: 10.0000 mg | Freq: Once | INTRAVENOUS | Status: DC | PRN

## 2020-11-29 MED ORDER — LACTATED RINGERS IV SOLN: Freq: Once | INTRAVENOUS | Status: AC

## 2020-11-29 MED ORDER — SODIUM CHLORIDE 0.9 % IR SOLN
Status: DC | PRN
  Administered 2020-11-29: 1000 mL

## 2020-11-29 MED ORDER — ACETAMINOPHEN 10 MG/ML IV SOLN
1000.0000 mg | Freq: Four times a day (QID) | INTRAVENOUS | Status: AC
  Administered 2020-11-29 – 2020-11-30 (×4): 1000 mg via INTRAVENOUS
  Filled 2020-11-29 (×4): qty 100

## 2020-11-29 MED ORDER — HYDROMORPHONE HCL 1 MG/ML IJ SOLN: 0.2500 mg | INTRAMUSCULAR | Status: DC | PRN

## 2020-11-29 MED ORDER — FENTANYL CITRATE (PF) 250 MCG/5ML IJ SOLN
INTRAMUSCULAR | Status: AC
  Filled 2020-11-29: qty 5

## 2020-11-29 MED ORDER — FENTANYL CITRATE (PF) 100 MCG/2ML IJ SOLN
INTRAMUSCULAR | Status: AC
  Filled 2020-11-29: qty 2

## 2020-11-29 MED ORDER — OXYCODONE HCL 5 MG/5ML PO SOLN: 5.0000 mg | Freq: Once | ORAL | Status: DC | PRN

## 2020-11-29 MED ORDER — BUPIVACAINE LIPOSOME 1.3 % IJ SUSP
20.0000 mL | Freq: Once | INTRAMUSCULAR | Status: AC
  Administered 2020-11-29: 20 mL
  Filled 2020-11-29: qty 20

## 2020-11-29 MED ORDER — DEXAMETHASONE SODIUM PHOSPHATE 10 MG/ML IJ SOLN
INTRAMUSCULAR | Status: AC
  Filled 2020-11-29: qty 1

## 2020-11-29 MED ORDER — DEXTROSE-NACL 5-0.45 % IV SOLN: INTRAVENOUS | Status: DC

## 2020-11-29 MED ORDER — LACTATED RINGERS IV SOLN: INTRAVENOUS | Status: DC | PRN

## 2020-11-29 MED ORDER — DEXAMETHASONE SODIUM PHOSPHATE 10 MG/ML IJ SOLN
INTRAMUSCULAR | Status: DC | PRN
  Administered 2020-11-29: 8 mg via INTRAVENOUS

## 2020-11-29 MED ORDER — ROCURONIUM BROMIDE 10 MG/ML (PF) SYRINGE
PREFILLED_SYRINGE | INTRAVENOUS | Status: DC | PRN
  Administered 2020-11-29: 10 mg via INTRAVENOUS
  Administered 2020-11-29: 70 mg via INTRAVENOUS
  Administered 2020-11-29: 10 mg via INTRAVENOUS
  Administered 2020-11-29: 20 mg via INTRAVENOUS
  Administered 2020-11-29 (×2): 10 mg via INTRAVENOUS
  Administered 2020-11-29: 30 mg via INTRAVENOUS
  Administered 2020-11-29 (×2): 10 mg via INTRAVENOUS

## 2020-11-29 MED ORDER — LIDOCAINE 20MG/ML (2%) 15 ML SYRINGE OPTIME
INTRAMUSCULAR | Status: DC | PRN
  Administered 2020-11-29: 1.5 mg/kg/h via INTRAVENOUS

## 2020-11-29 MED ORDER — LIDOCAINE 2% (20 MG/ML) 5 ML SYRINGE
INTRAMUSCULAR | Status: DC | PRN
  Administered 2020-11-29: 100 mg via INTRAVENOUS

## 2020-11-29 MED ORDER — EPHEDRINE SULFATE-NACL 50-0.9 MG/10ML-% IV SOSY
PREFILLED_SYRINGE | INTRAVENOUS | Status: DC | PRN
  Administered 2020-11-29 (×2): 5 mg via INTRAVENOUS

## 2020-11-29 MED ORDER — LACTATED RINGERS IR SOLN
Status: DC | PRN
  Administered 2020-11-29: 1000 mL

## 2020-11-29 MED ORDER — OXYCODONE HCL 5 MG PO TABS: 5.0000 mg | ORAL_TABLET | Freq: Once | ORAL | Status: DC | PRN

## 2020-11-29 MED ORDER — BOOST / RESOURCE BREEZE PO LIQD CUSTOM
1.0000 | Freq: Two times a day (BID) | ORAL | Status: DC
Start: 2020-11-29 — End: 2020-11-29

## 2020-11-29 MED ORDER — SODIUM CHLORIDE (PF) 0.9 % IJ SOLN
INTRAMUSCULAR | Status: AC
  Filled 2020-11-29: qty 20

## 2020-11-29 MED ORDER — DIPHENHYDRAMINE HCL 12.5 MG/5ML PO ELIX: 12.5000 mg | ORAL_SOLUTION | Freq: Four times a day (QID) | ORAL | Status: DC | PRN

## 2020-11-29 MED ORDER — FENTANYL CITRATE (PF) 250 MCG/5ML IJ SOLN
INTRAMUSCULAR | Status: DC | PRN
  Administered 2020-11-29 (×3): 50 ug via INTRAVENOUS
  Administered 2020-11-29: 100 ug via INTRAVENOUS

## 2020-11-29 MED ORDER — OXYCODONE HCL 5 MG PO TABS
5.0000 mg | ORAL_TABLET | ORAL | Status: DC | PRN
  Administered 2020-11-30 – 2020-12-04 (×14): 5 mg via ORAL
  Filled 2020-11-29 (×14): qty 1

## 2020-11-29 MED ORDER — SENNOSIDES-DOCUSATE SODIUM 8.6-50 MG PO TABS
2.0000 | ORAL_TABLET | Freq: Every day | ORAL | Status: DC
  Administered 2020-11-29 – 2020-12-02 (×4): 2 via ORAL
  Filled 2020-11-29 (×4): qty 2

## 2020-11-29 MED ORDER — PROSOURCE PLUS PO LIQD: 30.0000 mL | Freq: Two times a day (BID) | ORAL | Status: DC

## 2020-11-29 MED ORDER — STERILE WATER FOR INJECTION IJ SOLN
INTRAMUSCULAR | Status: DC | PRN
  Administered 2020-11-29: 20 mL

## 2020-11-29 MED ORDER — EPHEDRINE 5 MG/ML INJ
INTRAVENOUS | Status: AC
  Filled 2020-11-29: qty 10

## 2020-11-29 MED ORDER — ONDANSETRON HCL 4 MG/2ML IJ SOLN
INTRAMUSCULAR | Status: AC
  Filled 2020-11-29: qty 2

## 2020-11-29 SURGICAL SUPPLY — 96 items
APPLICATOR COTTON TIP 6 STRL (MISCELLANEOUS) ×2 IMPLANT
APPLICATOR COTTON TIP 6IN STRL (MISCELLANEOUS) ×3
APPLICATOR SURGIFLO ENDO (HEMOSTASIS) IMPLANT
BAG LAPAROSCOPIC 12 15 PORT 16 (BASKET) ×2 IMPLANT
BAG RETRIEVAL 12/15 (BASKET) ×3
BAG URO CATCHER STRL LF (MISCELLANEOUS) ×3 IMPLANT
BENZOIN TINCTURE PRP APPL 2/3 (GAUZE/BANDAGES/DRESSINGS) IMPLANT
BLADE SURG SZ10 CARB STEEL (BLADE) IMPLANT
CATH SILICONE 5CC 18FR (INSTRUMENTS) ×3 IMPLANT
CELLS DAT CNTRL 66122 CELL SVR (MISCELLANEOUS) ×2 IMPLANT
CHLORAPREP W/TINT 26 (MISCELLANEOUS) ×3 IMPLANT
CLIP VESOLOCK LG 6/CT PURPLE (CLIP) ×6 IMPLANT
CLIP VESOLOCK MED LG 6/CT (CLIP) ×3 IMPLANT
CLIP VESOLOCK XL 6/CT (CLIP) ×9 IMPLANT
CLOTH BEACON ORANGE TIMEOUT ST (SAFETY) ×3 IMPLANT
CNTNR URN SCR LID CUP LEK RST (MISCELLANEOUS) ×2 IMPLANT
CONT SPEC 4OZ STRL OR WHT (MISCELLANEOUS) ×1
COVER SURGICAL LIGHT HANDLE (MISCELLANEOUS) ×3 IMPLANT
COVER TIP SHEARS 8 DVNC (MISCELLANEOUS) ×2 IMPLANT
COVER TIP SHEARS 8MM DA VINCI (MISCELLANEOUS) ×1
COVER WAND RF STERILE (DRAPES) IMPLANT
DECANTER SPIKE VIAL GLASS SM (MISCELLANEOUS) ×3 IMPLANT
DERMABOND ADVANCED (GAUZE/BANDAGES/DRESSINGS) ×2
DERMABOND ADVANCED .7 DNX12 (GAUZE/BANDAGES/DRESSINGS) ×4 IMPLANT
DRAIN CHANNEL RND F F (WOUND CARE) IMPLANT
DRAIN PENROSE 0.5X18 (DRAIN) IMPLANT
DRAPE ARM DVNC X/XI (DISPOSABLE) ×8 IMPLANT
DRAPE COLUMN DVNC XI (DISPOSABLE) ×2 IMPLANT
DRAPE DA VINCI XI ARM (DISPOSABLE) ×4
DRAPE DA VINCI XI COLUMN (DISPOSABLE) ×1
DRSG TEGADERM 4X4.75 (GAUZE/BANDAGES/DRESSINGS) IMPLANT
ELECT REM PT RETURN 15FT ADLT (MISCELLANEOUS) ×3 IMPLANT
GAUZE 4X4 16PLY RFD (DISPOSABLE) IMPLANT
GLOVE SURG ENC MOIS LTX SZ6.5 (GLOVE) ×6 IMPLANT
GLOVE SURG ENC TEXT LTX SZ7.5 (GLOVE) ×9 IMPLANT
GLOVE SURG UNDER POLY LF SZ7.5 (GLOVE) IMPLANT
GOWN STRL REUS W/TWL LRG LVL3 (GOWN DISPOSABLE) ×15 IMPLANT
IRRIG SUCT STRYKERFLOW 2 WTIP (MISCELLANEOUS) ×3
IRRIGATION SUCT STRKRFLW 2 WTP (MISCELLANEOUS) ×2 IMPLANT
KIT PROCEDURE DA VINCI SI (MISCELLANEOUS)
KIT PROCEDURE DVNC SI (MISCELLANEOUS) IMPLANT
KIT TURNOVER KIT A (KITS) IMPLANT
LOOP VESSEL MAXI BLUE (MISCELLANEOUS) ×3 IMPLANT
MANIFOLD NEPTUNE II (INSTRUMENTS) ×3 IMPLANT
NEEDLE ASPIRATION 22 (NEEDLE) ×3 IMPLANT
NEEDLE INSUFFLATION 14GA 120MM (NEEDLE) ×3 IMPLANT
PACK CYSTO (CUSTOM PROCEDURE TRAY) ×3 IMPLANT
PACK ROBOT UROLOGY CUSTOM (CUSTOM PROCEDURE TRAY) ×3 IMPLANT
PAD POSITIONING PINK XL (MISCELLANEOUS) ×3 IMPLANT
PORT ACCESS TROCAR AIRSEAL 12 (TROCAR) ×2 IMPLANT
PORT ACCESS TROCAR AIRSEAL 5M (TROCAR) ×1
RELOAD STAPLER GREEN 60MM (STAPLE) ×8 IMPLANT
RELOAD STAPLER WHITE 60MM (STAPLE) ×8 IMPLANT
RETRACTOR LONRSTAR 16.6X16.6CM (MISCELLANEOUS) IMPLANT
RETRACTOR STAY HOOK 5MM (MISCELLANEOUS) IMPLANT
RETRACTOR STER APS 16.6X16.6CM (MISCELLANEOUS)
RTRCTR WOUND ALEXIS 18CM MED (MISCELLANEOUS) ×3
SEAL CANN UNIV 5-8 DVNC XI (MISCELLANEOUS) ×8 IMPLANT
SEAL XI 5MM-8MM UNIVERSAL (MISCELLANEOUS) ×4
SET TRI-LUMEN FLTR TB AIRSEAL (TUBING) ×3 IMPLANT
SOLUTION ELECTROLUBE (MISCELLANEOUS) ×3 IMPLANT
SPONGE LAP 18X18 RF (DISPOSABLE) ×6 IMPLANT
SPONGE LAP 4X18 RFD (DISPOSABLE) ×3 IMPLANT
STAPLER ECHELON LONG 60 440 (INSTRUMENTS) ×3 IMPLANT
STAPLER RELOAD GREEN 60MM (STAPLE) ×12
STAPLER RELOAD WHITE 60MM (STAPLE) ×12
STENT SET URETHERAL LEFT 7FR (STENTS) ×3 IMPLANT
STENT SET URETHERAL RIGHT 7FR (STENTS) ×3 IMPLANT
SURGIFLO W/THROMBIN 8M KIT (HEMOSTASIS) IMPLANT
SUT CHROMIC 4 0 RB 1X27 (SUTURE) ×3 IMPLANT
SUT ETHILON 3 0 PS 1 (SUTURE) ×3 IMPLANT
SUT MNCRL AB 4-0 PS2 18 (SUTURE) ×6 IMPLANT
SUT PDS AB 0 CTX 36 PDP370T (SUTURE) ×9 IMPLANT
SUT SILK 3 0 SH 30 (SUTURE) IMPLANT
SUT SILK 3 0 SH CR/8 (SUTURE) ×3 IMPLANT
SUT V-LOC BARB 180 2/0GR6 GS22 (SUTURE)
SUT VIC AB 2-0 CT1 27 (SUTURE)
SUT VIC AB 2-0 CT1 27XBRD (SUTURE) IMPLANT
SUT VIC AB 2-0 SH 18 (SUTURE) IMPLANT
SUT VIC AB 2-0 UR5 27 (SUTURE) ×12 IMPLANT
SUT VIC AB 3-0 SH 27 (SUTURE) ×6
SUT VIC AB 3-0 SH 27X BRD (SUTURE) ×4 IMPLANT
SUT VIC AB 3-0 SH 27XBRD (SUTURE) ×8 IMPLANT
SUT VIC AB 4-0 RB1 27 (SUTURE) ×4
SUT VIC AB 4-0 RB1 27XBRD (SUTURE) ×8 IMPLANT
SUT VLOC BARB 180 ABS3/0GR12 (SUTURE) ×3
SUTURE V-LC BRB 180 2/0GR6GS22 (SUTURE) IMPLANT
SUTURE VLOC BRB 180 ABS3/0GR12 (SUTURE) ×2 IMPLANT
SYR CONTROL 10ML LL (SYRINGE) IMPLANT
SYSTEM UROSTOMY GENTLE TOUCH (WOUND CARE) ×3 IMPLANT
TOWEL OR NON WOVEN STRL DISP B (DISPOSABLE) ×3 IMPLANT
TROCAR BLADELESS 15MM (ENDOMECHANICALS) ×3 IMPLANT
TROCAR XCEL NON-BLD 5MMX100MML (ENDOMECHANICALS) IMPLANT
TUBING CONNECTING 10 (TUBING) IMPLANT
WATER STERILE IRR 1000ML POUR (IV SOLUTION) ×3 IMPLANT
WATER STERILE IRR 3000ML UROMA (IV SOLUTION) ×3 IMPLANT

## 2020-11-29 NOTE — Anesthesia Postprocedure Evaluation (Signed)
Anesthesia Post Note  Patient: Barry Gibson  Procedure(s) Performed: XI ROBOTIC ASSISTED LAPAROSCOPIC COMPLETE CYSTECT ILEAL CONDUIT/RADICAL PROSTATECTOMY/LYMPHADENECTOMY (N/A Abdomen) CYSTOSCOPY WITH INJECTION OF INDOCYANINE GREEN DYE (N/A )     Patient location during evaluation: PACU Anesthesia Type: General Level of consciousness: awake and alert Pain management: pain level controlled Vital Signs Assessment: post-procedure vital signs reviewed and stable Respiratory status: spontaneous breathing, nonlabored ventilation, respiratory function stable and patient connected to nasal cannula oxygen Cardiovascular status: blood pressure returned to baseline and stable Postop Assessment: no apparent nausea or vomiting Anesthetic complications: no   No complications documented.  Last Vitals:  Vitals:   11/29/20 1445 11/29/20 1500  BP: (!) 148/89 (!) 145/94  Pulse: 88 82  Resp: 15 16  Temp: 36.7 C   SpO2: 97% 97%    Last Pain:  Vitals:   11/29/20 1445  TempSrc:   PainSc: 0-No pain                 Barnet Glasgow

## 2020-11-29 NOTE — Progress Notes (Signed)
Initial Nutrition Assessment  RD working remotely.  DOCUMENTATION CODES:   Not applicable  INTERVENTION:  - will order Boost Breeze BID, each supplement provides 250 kcal and 9 grams of protein. - will order 30 ml Prosource Plus BID, each supplement provides 100 kcal and 15 grams protein.  - will order 1 tablet multivitamin with minerals/day. - will complete NFPE when feasible.    NUTRITION DIAGNOSIS:   Increased nutrient needs related to cancer and cancer related treatments,post-op healing as evidenced by estimated needs  GOAL:   Patient will meet greater than or equal to 90% of their needs  MONITOR:   PO intake,Supplement acceptance,Diet advancement,Labs,Weight trends  REASON FOR ASSESSMENT:   Malnutrition Screening Tool  ASSESSMENT:   60 year-old male with medical history of HTN, GERD, cataracts, frequent urination, RA, COVID-19, and bladder cancer. Patient was admitted for planned cystoprostatectomy.  Patient has been out of the room/in pre-op and OR since 0707 today. He is noted to have eaten 75% of dinner on CLD last night.   He has not been seen by a [place] RD at any time in the past. MST score indicates that patient reported losing weight recently but denying any decrease in appetite PTA.  Weight yesterday and today documented as 211 lb. Weight on 12/1 was 216 lb. This indicates 5 lb weight loss (2.3% body weight) in the past 5 weeks; not significant for time frame.  Weight on 11/7 was 229 lb. This indicates 18 lb weight loss (7.8% body weight) in the past 2 months; significant for time frame.    Labs reviewed; K: 3.3 mmol/l, creatinine: 1.27 mg/dl. Medications reviewed; 40 mg oral protonix/day. IVF; NS @ 50 ml/hr.     NUTRITION - FOCUSED PHYSICAL EXAM:  unable to complete at this time.   Diet Order:   Diet Order            Diet clear liquid Room service appropriate? Yes; Fluid consistency: Thin  Diet effective now                 EDUCATION  NEEDS:   Not appropriate for education at this time  Skin:  Skin Assessment: Reviewed RN Assessment  Last BM:  PTA/unknown  Height:   Ht Readings from Last 1 Encounters:  11/29/20 5\' 11"  (1.803 m)    Weight:   Wt Readings from Last 1 Encounters:  11/29/20 95.8 kg     Estimated Nutritional Needs:  Kcal:  2100-2300 kcal Protein:  110-125 grams Fluid:  >/= 2.5 L/day       Jarome Matin, MS, RD, LDN, CNSC Inpatient Clinical Dietitian RD pager # available in [facility]  After hours/weekend pager # available in [facility]

## 2020-11-29 NOTE — Brief Op Note (Signed)
11/28/2020 - 11/29/2020  2:30 PM  PATIENT:  Barry Gibson  60 y.o. male  PRE-OPERATIVE DIAGNOSIS:  BLADDER CANCER  POST-OPERATIVE DIAGNOSIS:  BLADDER CANCER  PROCEDURE:  Procedure(s) with comments: XI ROBOTIC ASSISTED LAPAROSCOPIC COMPLETE CYSTECT ILEAL CONDUIT/RADICAL PROSTATECTOMY/LYMPHADENECTOMY (N/A) CYSTOSCOPY WITH INJECTION OF INDOCYANINE GREEN DYE (N/A) - 6 HRS  SURGEON:  Surgeon(s) and Role:    Alexis Frock, MD - Primary  PHYSICIAN ASSISTANT:   ASSISTANTS: Clemetine Marker PA   ANESTHESIA:   local and general  EBL:  190 mL   BLOOD ADMINISTERED:none  DRAINS: 1- JP to bulb; 2- Urostomy to gravity wtih Rt( red) and Lt (blue) bander stents. BIilaeral neph tubes clapped.    LOCAL MEDICATIONS USED:  MARCAINE     SPECIMEN:  Source of Specimen:  1 - pelvic lymph nodes; 2 - ureteral margins; 3 - bladder + prostate  DISPOSITION OF SPECIMEN:  PATHOLOGY  COUNTS:  YES  TOURNIQUET:  * No tourniquets in log *  DICTATION: .Other Dictation: Dictation Number  [id number]  PLAN OF CARE: Admit to inpatient   PATIENT DISPOSITION:  PACU - hemodynamically stable.   Delay start of Pharmacological VTE agent (>24hrs) due to surgical blood loss or risk of bleeding: yes

## 2020-11-29 NOTE — Transfer of Care (Signed)
Immediate Anesthesia Transfer of Care Note  Patient: Barry Gibson  Procedure(s) Performed: Procedure(s) with comments: XI ROBOTIC ASSISTED LAPAROSCOPIC COMPLETE CYSTECT ILEAL CONDUIT/RADICAL PROSTATECTOMY/LYMPHADENECTOMY (N/A) CYSTOSCOPY WITH INJECTION OF INDOCYANINE GREEN DYE (N/A) - 6 HRS  Patient Location: PACU  Anesthesia Type:General  Level of Consciousness: Alert, Awake, Oriented  Airway & Oxygen Therapy: Patient Spontanous Breathing  Post-op Assessment: Report given to RN  Post vital signs: Reviewed and stable  Last Vitals:  Vitals:   11/29/20 0443 11/29/20 0705  BP: 129/90 (!) 143/93  Pulse: 81 84  Resp: 16 18  Temp: 36.7 C 37.1 C  SpO2: 00% 92%    Complications: No apparent anesthesia complications

## 2020-11-29 NOTE — Anesthesia Procedure Notes (Signed)
Procedure Name: Intubation Date/Time: 11/29/2020 8:55 AM Performed by: Eben Burow, CRNA Pre-anesthesia Checklist: Patient identified, Emergency Drugs available, Suction available, Patient being monitored and Timeout performed Patient Re-evaluated:Patient Re-evaluated prior to induction Oxygen Delivery Method: Circle system utilized Preoxygenation: Pre-oxygenation with 100% oxygen Induction Type: IV induction Ventilation: Mask ventilation without difficulty Laryngoscope Size: Mac and 4 Grade View: Grade I Tube type: Oral Number of attempts: 1 Airway Equipment and Method: Stylet Placement Confirmation: ETT inserted through vocal cords under direct vision,  positive ETCO2 and breath sounds checked- equal and bilateral Secured at: 22 cm Tube secured with: Tape Dental Injury: Teeth and Oropharynx as per pre-operative assessment

## 2020-11-29 NOTE — Progress Notes (Signed)
Day of Surgery   Subjective/Chief Complaint:  1 - BCG-Refractory High Grade Bladder Cancer - T1G3 bladder cancer treated with TURBT + BCG x 6 and some maintenance 2020. Recurrence 04/2020 T1G3 with muscle in specimen and then new bilateral malignant hydro on CT 09/2020 (clinical stage 3).   2 - Malignant Hydronephrosis - new bilateral moderate hydro to bladder on restaging CT 09/2020. Neph tubes placed 09/2020. Cr peak 4.8, Baseline Cr 1.1. Most recent UCX non-clonal. Neph tubes excahnged 11/19/20 as became dislodged.    3 - Prostate Screening - PSA 0.7 2019.    Today " Barry Gibson " is seen to proceed with cystectomy and urinary diversion. Complete bowel prep to clear. GFR at baseline. Hgb 12.5.    Objective: Vital signs in last 24 hours: Temp:  [97.9 F (36.6 C)-98.7 F (37.1 C)] 98.7 F (37.1 C) (01/12 0705) Pulse Rate:  [81-87] 84 (01/12 0705) Resp:  [16-18] 18 (01/12 0705) BP: (129-145)/(90-97) 143/93 (01/12 0705) SpO2:  [95 %-97 %] 97 % (01/12 0705) Weight:  [95.8 kg] 95.8 kg (01/12 0724) Last BM Date: 11/28/20  Intake/Output from previous day: 01/11 0701 - 01/12 0700 In: 1104 [P.O.:600; I.V.:504] Out: 250 [Urine:250] Intake/Output this shift: No intake/output data recorded.  Physical Exam Vitals reviewed.  Constitutional:      Comments:  HENT:     Mouth/Throat:     Mouth: Mucous membranes are moist.  Eyes:     Pupils: Pupils are equal, round, and reactive to light.  Cardiovascular:     Rate and Rhythm: Normal rate.     Pulses: Normal pulses.  Pulmonary:     Effort: Pulmonary effort is normal.  Abdominal:     General: Abdomen is flat.  RLQ Urostomy marking site noted.  Genitourinary:    Penis: Normal.      Comments: Bilateral neph tubes in place with non-foul urine.  Musculoskeletal:     Cervical back: Normal range of motion.     Comments: Stable LUE hand hypoplasia.   Neurological:     Mental Status: He is alert.  Psychiatric:        Mood and Affect: Mood  normal.   Lab Results:  Recent Labs    11/28/20 1642  WBC 8.1  HGB 12.5*  HCT 38.2*  PLT 421*   BMET Recent Labs    11/28/20 1642  NA 139  K 3.3*  CL 105  CO2 24  GLUCOSE 89  BUN 16  CREATININE 1.27*  CALCIUM 8.9   PT/INR No results for input(s): LABPROT, INR in the last 72 hours. ABG No results for input(s): PHART, HCO3 in the last 72 hours.  Invalid input(s): PCO2, PO2  Studies/Results: No results found.  Anti-infectives: Anti-infectives (From admission, onward)   Start     Dose/Rate Route Frequency Ordered Stop   11/29/20 0700  [MAR Hold]  piperacillin-tazobactam (ZOSYN) IVPB 3.375 g        (MAR Hold since Wed 11/29/2020 at 0707.Hold Reason: Transfer to a Procedural area.)   3.375 g 12.5 mL/hr over 240 Minutes Intravenous Every 8 hours 11/28/20 1921 11/30/20 0659   11/29/20 0600  ceFAZolin (ANCEF) IVPB 2g/100 mL premix  Status:  Discontinued        2 g 200 mL/hr over 30 Minutes Intravenous On call to O.R. 11/28/20 1619 11/28/20 1650   11/28/20 2000  neomycin (MYCIFRADIN) tablet 1,000 mg        1,000 mg Oral Every 4 hours 11/28/20 1905 11/28/20 2344   11/28/20  2000  metroNIDAZOLE (FLAGYL) tablet 500 mg        500 mg Oral Every 4 hours 11/28/20 1905 11/28/20 2344      Assessment/Plan:  Proceed as planned with major extirpative surgery for bladder cancer. Risks, benefits, expected peri-op course discussed several times previously and reiterated today.   Alexis Frock 11/29/2020

## 2020-11-29 NOTE — Discharge Instructions (Signed)

## 2020-11-30 ENCOUNTER — Encounter (HOSPITAL_COMMUNITY): Payer: Self-pay | Admitting: Urology

## 2020-11-30 LAB — BASIC METABOLIC PANEL
Anion gap: 10 (ref 5–15)
BUN: 16 mg/dL (ref 6–20)
CO2: 23 mmol/L (ref 22–32)
Calcium: 8.6 mg/dL — ABNORMAL LOW (ref 8.9–10.3)
Chloride: 106 mmol/L (ref 98–111)
Creatinine, Ser: 1.45 mg/dL — ABNORMAL HIGH (ref 0.61–1.24)
GFR, Estimated: 56 mL/min — ABNORMAL LOW (ref >60)
Glucose, Bld: 157 mg/dL — ABNORMAL HIGH (ref 70–99)
Potassium: 4 mmol/L (ref 3.5–5.1)
Sodium: 139 mmol/L (ref 135–145)

## 2020-11-30 LAB — HEMOGLOBIN AND HEMATOCRIT, BLOOD
HCT: 32 % — ABNORMAL LOW (ref 39.0–52.0)
Hemoglobin: 10.3 g/dL — ABNORMAL LOW (ref 13.0–17.0)

## 2020-11-30 MED ORDER — PIPERACILLIN-TAZOBACTAM 3.375 G IVPB
3.3750 g | Freq: Once | INTRAVENOUS | Status: AC
  Administered 2020-11-30: 3.375 g via INTRAVENOUS
  Filled 2020-11-30: qty 50

## 2020-11-30 NOTE — Progress Notes (Signed)
1 Day Post-Op   Subjective/Chief Complaint:  1 - BCG-Refractory High Grade Bladder Cancer - T1G3 bladder cancer treated with TURBT + BCG x 6 and some maintenance 2020. Recurrence 04/2020 T1G3 with muscle in specimen and then new bilateral malignant hydro on CT 09/2020 (clinical stage 3). POD1 s/p robotic cystoprostatectomy with ileal conduit urinary diversion.  2 - Malignant Hydronephrosis - new bilateral moderate hydro to bladder on restaging CT 09/2020. Neph tubes placed 09/2020. Cr peak 4.8, Baseline Cr 1.1. Most recent UCX non-clonal. Neph tubes excahnged 11/19/20 as became dislodged.  Neph tubes capped  3 - Prostate Screening - PSA 0.7 2019.    Today " Barry Gibson " is doing well. He did have some abdominal pain overnight which improved with pain medication. He has been up to chair and walked last night. Denies flatus or BM. Denies N/V. seen to proceed with cystectomy and urinary diversion.   Post op Hb stable at 10.3, Cr 1.4 1.45L urine output 145cc from JP drain   Objective: Vital signs in last 24 hours: Temp:  [97.7 F (36.5 C)-98.7 F (37.1 C)] 97.7 F (36.5 C) (01/13 1020) Pulse Rate:  [80-88] 80 (01/13 1020) Resp:  [13-20] 14 (01/13 1020) BP: (133-148)/(83-94) 140/87 (01/13 1020) SpO2:  [93 %-97 %] 94 % (01/13 1020) Last BM Date: 11/28/20  Intake/Output from previous day: 01/12 0701 - 01/13 0700 In: 4266.3 [I.V.:3966.2; IV Piggyback:300] Out: 2145 [Urine:1750; Drains:145; Blood:250] Intake/Output this shift: Total I/O In: -  Out: 30 [Drains:30]  Physical Exam Vitals reviewed.  Constitutional:      Comments:  HENT:     Mouth/Throat:     Mouth: Mucous membranes are moist.  Eyes:     Pupils: Pupils are equal, round, and reactive to light.  Cardiovascular:     Rate and Rhythm: Normal rate.     Pulses: Normal pulses.  Pulmonary:     Effort: Pulmonary effort is normal on room air Abdominal:     General: Abdomen is soft, appropriately tender. Ostomy in RLQ, dark pink,  healthy. Urine clear yellow from ostomy. JP drain in place with serosanguinous output.  Genitourinary:    Penis: Normal.      Comments: Bilateral neph tubes capped Musculoskeletal:     Cervical back: Normal range of motion.     Comments: Stable LUE hand hypoplasia.   Neurological:     Mental Status: He is alert.  Psychiatric:        Mood and Affect: Mood normal.   Lab Results:  Recent Labs    11/28/20 1642 11/29/20 1526 11/30/20 0554  WBC 8.1  --   --   HGB 12.5* 11.0* 10.3*  HCT 38.2* 35.1* 32.0*  PLT 421*  --   --    BMET Recent Labs    11/28/20 1642 11/30/20 0554  NA 139 139  K 3.3* 4.0  CL 105 106  CO2 24 23  GLUCOSE 89 157*  BUN 16 16  CREATININE 1.27* 1.45*  CALCIUM 8.9 8.6*   PT/INR No results for input(s): LABPROT, INR in the last 72 hours. ABG No results for input(s): PHART, HCO3 in the last 72 hours.  Invalid input(s): PCO2, PO2  Studies/Results: No results found.  Anti-infectives: Anti-infectives (From admission, onward)   Start     Dose/Rate Route Frequency Ordered Stop   11/30/20 0615  piperacillin-tazobactam (ZOSYN) IVPB 3.375 g        3.375 g 12.5 mL/hr over 240 Minutes Intravenous  Once 11/30/20 0519  11/29/20 0700  piperacillin-tazobactam (ZOSYN) IVPB 3.375 g        3.375 g 12.5 mL/hr over 240 Minutes Intravenous Every 8 hours 11/28/20 1921 11/30/20 0659   11/29/20 0600  ceFAZolin (ANCEF) IVPB 2g/100 mL premix  Status:  Discontinued        2 g 200 mL/hr over 30 Minutes Intravenous On call to O.R. 11/28/20 1619 11/28/20 1650   11/28/20 2000  neomycin (MYCIFRADIN) tablet 1,000 mg        1,000 mg Oral Every 4 hours 11/28/20 1905 11/28/20 2344   11/28/20 2000  metroNIDAZOLE (FLAGYL) tablet 500 mg        500 mg Oral Every 4 hours 11/28/20 1905 11/28/20 2344      Assessment/Plan:  POD1 s/p robotic cystoprostatectomy with ileal conduit, recovering well.  - Continue IVF, NPO with ice chips - Bowel regimen, entereg until ROBF - Daily  labs - Ostomy teaching - PT consult, OOB as able - SCD for ppx   Carmie Kanner 11/30/2020

## 2020-11-30 NOTE — Op Note (Signed)
NAME: Barry Gibson, Barry Gibson MEDICAL RECORD [id number] ACCOUNT [id number] DATE OF BIRTH:November 27, 1960 FACILITY: [facility] LOCATION: [id number] PHYSICIAN:Mariyah Upshaw Tresa Moore, MD  OPERATIVE REPORT  DATE OF PROCEDURE:  11/29/2020  PREOPERATIVE DIAGNOSIS:  Stage III, likely oligometastatic bladder cancer.  POSTOPERATIVE DIAGNOSIS:  Stage III, likely oligometastatic bladder cancer.  PROCEDURE: 1.  Cystoscopy with injection of an indocyanine dye. 2.  Robotic-assisted laparoscopic radical cystoprostatectomy, bilateral pelvic lymph node dissection and ileal conduit urinary diversion.  ESTIMATED BLOOD LOSS:  200 mL.  ASSISTANT:  Debbrah Alar, PA  SPECIMENS: 1.  Left distal ureteral margin frozen section. 2.  Left final distal ureteral margin. 3.  Right distal ureteral margin frozen section. 4.  Final right distal ureteral margin. 5.  Left external iliac lymph nodes. 6.  Left obturator lymph nodes. 7.  Left common iliac lymph nodes. 8.  Right pelvic lymph nodes en bloc (external iliac, internal iliac, obturator, common iliac). 9.  Cystoprostatectomy.  FINDINGS: 1.  Large volume residual bladder cancer, mostly intertrigone with cystoscopy. 2.  Bilateral sentinel lymph nodes denoted as such on pathology requisition. 3.  Bilateral hydronephrosis. 5.  Right greater than left pelvic adenopathy, right side borderline bulky.  DRAINS: 1.  Jackson-Pratt drain to bulb suction. 2.  Urostomy to gravity drainage with right (red), left (blue bander stents, bilateral nephrostomy tubes capped.  INDICATIONS:  The patient is a very pleasant 60 year old man who was found to have aggressive bladder cancer on evaluation of hematuria.  He had clinically stage III disease with bilateral hydronephrosis, also some renal functional compromise.  He  underwent bilateral nephrostomy placement, which maintained his renal function.  On staging imaging, he also had some borderline adenopathy, which had increased somewhat with  serial imaging consistent with likely oligometastatic disease.  He had had  evaluation in medical oncology.  Preoperatively, they discussed the role of neoadjuvant chemotherapy and given his malignant obstruction, felt that primary surgery followed by adjuvant chemotherapy would be most reasonable means of management given his  young age and excellent functional status.  He has elected this aggressive path.  He was admitted yesterday for bowel prep, stomal marking and labs, which were all favorable.  He wished to proceed with surgery today.  Informed consent was obtained and  placed in medical record.  PROCEDURE IN DETAIL:  The patient being himself verified, the procedure being cystoscopy with ICG injection and robotic cystoprostatectomy with ileal conduit was confirmed.  Procedure timeout was performed.  Intravenous antibiotics administered.  General  endotracheal anesthesia induced.  The patient was placed into a low lithotomy position.  Sterile field was created, prepped and draped the patient's penis, perineum and proximal thighs using iodine and his infra-xiphoid abdomen utilizing chlorhexidine  gluconate after clipper shaving and further fastening him to the OR table using 3-inch tape over foam padding across the supraxiphoid chest.  His arms were tucked at his side using gel rolls after his bilateral nephrostomy tubes were capped.  A test of  steep Trendelenburg positioning was performed and found to be suitably positioned.  Next, a cystourethroscopy was performed using a 24-French injection scope with 0-degree lens.  Inspection of anterior and posterior urethra was unremarkable.  Inspection  of bladder revealed diffuse inflammatory changes as well as frank papillary tumor mostly in the intertrigone area.  Ureteral orifices were essentially obliterated.  Next, 2 mL indocyanine green dye was injected across several submucosal blebs in the area  of the intertrigone area and a silicone catheter was  placed free to straight drain.  Next,  a high-flow, low-pressure pneumoperitoneum was obtained using Veress technique in the supraumbilical midline having passed the aspiration and drop test.  An 8 mm  robotic camera was then placed in same location.  Laparoscopic examination of peritoneal cavity revealed some relatively loose adhesions of the sigmoid and some sigmoid redundancy, but no visceral injury or obvious distant disease.  Distal ports were  placed as follows:  Right paramedian 8 mm robotic port, right far lateral 12 mm AirSeal assist port, left paramedian 8 mm robotic port, left far lateral 8 mm robotic port, right paramedian 15 mm assistant port at the previously marked stomal site.  The  robot was then docked and passed electronic checks.  Initial attention was directed at the left retroperitoneal dissection.  Incision was made in the posterior peritoneum lateral to the descending colon from the area of the iliac vessels superiorly a  distance of approximately 10 cm and inferiorly coursing lateral to the left medial umbilical ligament towards the area of the anterior abdominal wall, thus creating a large retroperitoneal flap, which was used to retract the bowel medially.  The ureters  encountered as it coursed over the iliac vessels marked as vessel loop, dissected proximally to the area of the crossing of the iliac vessels and then distally to the ureterovesical junction.  The ureter was large in caliber with some desmoplastic  reaction around this, but no obvious extraluminal neoplasm.  Ureter was doubly clipped and ligated.  Frozen section negative for any carcinoma.  The proximal clip containing a dyed tagged suture and this was tucked out of the true pelvis.  Attention was  directed at left-sided lymphadenectomy first of the left common iliac group with the boundaries being aortic bifurcation and iliac bifurcation.  Lymphostasis was achieved with cold clips.  This tissue was set aside labeled  left common iliac lymph nodes.   Next, left external iliac group was dissected free with the boundaries being iliac bifurcation, internal inguinal ring, external iliac artery, and vein.  Lymphostasis achieved with cold clips, was set aside, labeled left external iliac lymph nodes.   Next, left obturator group was dissected free with the boundaries being left external iliac vein, obturator nerve, pelvic sidewall.  Lymphostasis was again achieved with cold clips, set aside, labeled left external iliac lymph nodes.  Left obturator  nerve was inspected following maneuvers and found to be uninjured.  Dissection then proceeded inferiorly lateral to the left side of the bladder and then prostate to the endopelvic fascia to the deep pelvis on the left side.  Attention was then directed  at right side retroperitoneal dissection.  The ileocecal junction was quite high, actually essentially in the right upper quadrant.  This was somewhat difficult to visualize; however, the terminal ileum was verified and encountered, traced proximally for  a distance of approximately 20 cm proximal to ileocecal junction, marked with a silk tagged clip at the area of the epiploic fat and then a single non-tagged clip distal to this for proximal distal orientation and later conduit formation.  Attention was  directed at right-sided retroperitoneal dissection.  Incision was made lateral to the area of the normal anatomic ascending colon.  However, the colon was superior to this.  The posterior peritoneum from the area of the iliac vessels superiorly a  distance of approximately 10 cm and inferiorly coursing lateral to right medial umbilical ligament towards the anterior abdominal wall, this created a large retroperitoneal flap, which was then extended in a Y-shaped fashion towards the  area of the  aortic bifurcation and using the posterior peritoneum as a retractor.  Attention was directed at identification and dissection of the right  ureter.  Right ureter was found coursing over the iliac vessels.  Similar to the left, it was large in caliber and  somewhat thickened, but no obvious extravesical disease.  It was dissected proximally to the area of the canal crossing distally to the ureterovesical junction, which was doubly clipped and ligated with the proximal clip containing a white tagged  suture.  Frozen section negative for carcinoma.  This was tucked out of the true pelvis.  Attention was directed at right-sided pelvic lymphadenectomy.  As expected based on preoperative imaging, there was some borderline bulky adenopathy, mostly of the  right common iliac and external iliac groups respectively.  Exquisite care was taken to tease this very, very large and somewhat matted lymph node packet away from the common iliac and external iliac vessels.  I was quite happy with the extent of  lymphadenectomy  and the areas of the presumed gross adenopathy were removed in the en bloc specimen _____ right pelvic lymph nodes.  Again, this did incorporate the right common, right internal, right external iliac and right obturator groups  respectively.  Left obturator nerve was inspected following maneuvers and found to be uninjured.  Given the large size, the specimen was placed into a smaller EndoCatch bag for later retrieval.  Dissection then proceeded inferiorly lateral to the right  bladder towards the area of the endopelvic fascia, which was carefully swept away from the lateral aspect of the prostate towards the area of the prostate apex.  Attention was then directed at posterior dissection.  A posterior peritoneal flap was  created by connecting the previous lateral bladder dissection areas and creating an inverted U-shaped peritoneal flap which was swept away from the posterior aspect of the bladder and behind the seminal vesicles, behind the prostate towards the prostate  apex, keeping this peritoneum intact.  Thus, forming a nice protective  layer superior to the true rectum.  This exposed the vascular pedicles of the bladder and prostate which actually was controlled using white load stapler x2 each side, taking  exquisite care to provide complete resection of the specimen, but not to injure the iliac vessels, obturator nerve or rectum, which did not occur.  Anterior dissection was performed by developing the space of Retzius between the medial umbilical  ligaments towards the area of the apex of the prostate.  This exposed the dorsal venous complex, which was controlled using a green load stapler.  Final apical dissection was performed by placing the bladder and prostate specimen on superior traction,  transecting the membranous urethra in its anterior 50% circumference, doubly clipping the in situ Foley catheter using as a bucket handle and the posterior circumference.  This completely freed up the bladder plus prostate, specimen en bloc which was  placed in an extra-large EndoCatch bag and the right pelvic lymph node specimen was placed within this.  Sponge, needle counts were correct.  Hemostasis was excellent.  Digital rectal exam was performed using indicator glove under laparoscopic vision.   No evidence of rectal violation was noted.  Next, the membranous urethral stump was oversewn using 3-0 V-Loc to prevent prolonged penile drainage.  Attention was directed at transition of the left ureter.  A plane just anterior to the aortic bifurcation  was developed on the right side of the colon and via this window, the left ureter was  visualized and brought through to the right retroperitoneum.  Next, the right ureter, left ureter, terminal ileum tag sutures were placed into a single Hem-o-Lok clip.   A closed suction drain was brought out the previous left lateral most port site to the peritoneal cavity.  The specimen bag string was brought through the previous left paramedian robotic port site and the tagged sutures were grasped with a  laparoscopic  locking needle driver via the 15 mm port site.  Next, incision was made in the midline for a total distance of approximately 7 cm, incorporating the previous camera port site superiorly and inferiorly.  The specimen bag string was brought through this  site and this was the en bloc bladder plus prostate specimen which was set aside for pathology as well as right pelvic lymph node specimen which was set aside for pathology.  Next, a wound protector retractor was placed via this site.  The right and left  ureters were brought up through this and appeared to be of sufficient length and vascularity.  The terminal ileum was also brought through this.  Attention was directed at harvesting of the conduit.  A segment of distal ileum 14 cm in length was taken  out of continuity using a green load stapler x1 each side and development of the mesentery using 1-1/2 loads white load stapler distally, 1 load proximally, which revealed an excellent harvesting of the  suitable segment of bowel, which was of suitable  length.  The proximal distal orientation had already been previously marked and the proximal staple line was excluded using running Vicryl.  This was aligned into retroperitoneal orientation.  Attention was directed to the bowel-bowel anastomosis which  was performed using 1.5 length loads of the green load stapler on the antimesenteric border and the free end oversewn using running silk, followed by a second imbricating layer of running silk.  The mesentery was somewhat thick.  There was minimal  mesenteric window; however, this minimal window was reapproximated using interrupted silk x2.  The bowel-bowel anastomosis was visibly viable, palpably patent, then redelivered into the abdominal cavity.  The distal staple line on the conduit was  removed.  Attention was directed at ureteroenteric anastomosis.  First, the left ureter approximately 4 mm segment of proximal conduit serosa mucosa was incised  using a V shaped incision and 4 mucosal everting sutures were applied using 4-0 Vicryl.  Heel  stitch was applied at the spatulated left ureter.  This was spatulated for a distance of approximately 8 mm and the blue colored bander stent was advanced to 26 cm to anastomosis and further ureteroenteric anastomosis was performed using 2 separate  running suture lines of 4-0 Vicryl from the heel to the toe respectively resulting in excellent tension-free apposition.  Next, the right ureter was anastomosed to the contralateral side of the proximal conduit in a similar fashion by using a red colored  bander stent to 26 cm anastomosis.  The previous 15 mm port site was excised at the level of the skin and approximately quarter diameter through the skin and subcutaneous tissue to the level of the fascia which was dilated to accommodate 2 surgeon's  fingers and 4-quadrant 2-0 Vicryl sutures were applied to the level of fascia.  The conduit segment was brought through this and anchored to the level of the fascia using the previous quadrant sutures to prevent parastomal hernia formation and 4  rosebudding sutures were applied using the same2-0 sutures in a quadrant fashion.  It  was then set aside.  The abdominal cavity was once again inspected.  Hemostasis was excellent.  Sponge, needle counts were correct.  Omentum was brought over the extraction site, which was then closed using figure-of-eight PDS x7 followed by reapproximation of Scarpa's with running Vicryl.  All incision sites were infiltrated with dilute lipolyzed Marcaine and closed at the level of  the skin using subcuticular Monocryl and Dermabond.  Final attention was directed at maturation of the stoma.  Stoma was matured after tying the rosebudding sutures, which resulted in excellent rosebudding of the stoma.  The skin to conduit was further  reapproximated using interrupted Vicryl x2 each quadrant.  Stoma appliance was applied and the procedure  terminated.  The patient tolerated the procedure well.  No immediate complications.  The patient was taken to postanesthesia care in stable condition  with plan for progressive admission.  Please note, first assistant, Debbrah Alar, was crucial for all portions of surgery today.  She provided invaluable retraction, vascular clipping, lymphatic clipping, vascular stapling, specimen manipulation, retraction, suctioning, and general first  assistance.  IN/NUANCE  D:11/29/2020 T:11/30/2020 JOB:[id number]

## 2020-11-30 NOTE — Consult Note (Signed)
[place] Nurse ostomy consult note Patient was walking and up in chair when I attempted earlier.  He now states he was up 2 hours in chair and is exhausted.  Wife at bedside and asks if we can do teaching tomorrow at 1.  I agreed to this time. He states overall he is feeling good.  Stoma type/location: RUQ ileal conduit with stents in place.  Red (right) blue (left) Stomal assessment/size: 1" pink and moist   Peristomal assessment: pouch intact Treatment options for stomal/peristomal skin: 1 piece convex Output blood tinged liquid HE is connected to bedside drainage. We discussed connecting and disconnecting and rationale for this. Written materials and SS card left with wife.  Ostomy pouching: 1pc.convex Education provided: See above Enrolled patient in [place] program:No Will follow.  Domenic Moras MSN, RN, FNP-BC [facility] Wound, Ostomy, Continence Nurse Pager [phone]

## 2020-11-30 NOTE — Evaluation (Signed)
Physical Therapy Evaluation Patient Details Name: Barry Gibson MRN: [id number] DOB: Apr 11, 1961 Today's Date: 11/30/2020   History of Present Illness  Pt is a 60 year old male s/p robotic cystoprostatectomy with ileal conduit due to bladder cancer  Clinical Impression  Pt admitted with above diagnosis.  Pt currently with functional limitations due to the deficits listed below (see PT Problem List). Pt will benefit from skilled PT to increase their independence and safety with mobility to allow discharge to the venue listed below.  Pt pleased pain is more tolerable today and he was able to ambulate 200 feet in hallway with RW.  Pt encouraged to ambulate at least 3x/day with nursing initially.  Pt plans to d/c home with spouse.     Follow Up Recommendations No PT follow up    Equipment Recommendations  Rolling walker with 5" wheels (however will likely progress to no needs)    Recommendations for Other Services       Precautions / Restrictions Precautions Precaution Comments: L JP drain, R LQ urostomy      Mobility  Bed Mobility Overal bed mobility: Needs Assistance Bed Mobility: Rolling;Sidelying to Sit Rolling: Supervision Sidelying to sit: Supervision       General bed mobility comments: cues for log roll technique    Transfers Overall transfer level: Needs assistance Equipment used: Rolling walker (2 wheeled) Transfers: Sit to/from Stand Sit to Stand: Min guard         General transfer comment: cues for hand placement  Ambulation/Gait Ambulation/Gait assistance: Min guard Gait Distance (Feet): 200 Feet Assistive device: Rolling walker (2 wheeled) Gait Pattern/deviations: Step-through pattern;Decreased stride length     General Gait Details: verbal cues for safe use of RW and posture, distance to tolerance  Stairs            Wheelchair Mobility    Modified Rankin (Stroke Patients Only)       Balance                                              Pertinent Vitals/Pain Pain Assessment: 0-10 Pain Score: 5  Pain Location: surgical sites Pain Descriptors / Indicators: Sore Pain Intervention(s): Repositioned;Monitored during session    Home Living Family/patient expects to be discharged to:: Private residence Living Arrangements: Spouse/significant other   Type of Home: House Home Access: Stairs to enter   Technical brewer of Steps: 2 Home Layout: Able to live on main level with bedroom/bathroom Home Equipment: None      Prior Function Level of Independence: Independent               Hand Dominance        Extremity/Trunk Assessment        Lower Extremity Assessment Lower Extremity Assessment: Overall WFL for tasks assessed    Cervical / Trunk Assessment Cervical / Trunk Assessment: Normal  Communication   Communication: No difficulties  Cognition Arousal/Alertness: Awake/alert Behavior During Therapy: WFL for tasks assessed/performed Overall Cognitive Status: Within Functional Limits for tasks assessed                                        General Comments      Exercises     Assessment/Plan    PT Assessment Patient needs continued  PT services  PT Problem List Decreased mobility;Decreased activity tolerance;Decreased knowledge of use of DME       PT Treatment Interventions DME instruction;Gait training;Balance training;Therapeutic exercise;Functional mobility training;Therapeutic activities;Patient/family education    PT Goals (Current goals can be found in the Care Plan section)  Acute Rehab PT Goals PT Goal Formulation: With patient Time For Goal Achievement: 12/14/20 Potential to Achieve Goals: Good    Frequency Min 3X/week   Barriers to discharge        Co-evaluation               AM-PAC PT "6 Clicks" Mobility  Outcome Measure Help needed turning from your back to your side while in a flat bed without using bedrails?: A  Little Help needed moving from lying on your back to sitting on the side of a flat bed without using bedrails?: A Little Help needed moving to and from a bed to a chair (including a wheelchair)?: A Little Help needed standing up from a chair using your arms (e.g., wheelchair or bedside chair)?: A Little Help needed to walk in hospital room?: A Little Help needed climbing 3-5 steps with a railing? : A Little 6 Click Score: 18    End of Session   Activity Tolerance: Patient tolerated treatment well Patient left: in chair;with call bell/phone within reach   PT Visit Diagnosis: Difficulty in walking, not elsewhere classified (R26.2)    Time: 0786-7544 PT Time Calculation (min) (ACUTE ONLY): 15 min   Charges:   PT Evaluation $PT Eval Low Complexity: 1 Low        Kati PT, DPT Acute Rehabilitation Services Pager: [phone] Office: [phone]  York Ram E 11/30/2020, 2:03 PM

## 2020-11-30 NOTE — Plan of Care (Signed)
  Problem: Clinical Measurements: Goal: Will remain free from infection Outcome: Progressing   Problem: Clinical Measurements: Goal: Diagnostic test results will improve Outcome: Progressing   Problem: Activity: Goal: Risk for activity intolerance will decrease Outcome: Progressing   Problem: Nutrition: Goal: Adequate nutrition will be maintained Outcome: Progressing   Problem: Pain Managment: Goal: General experience of comfort will improve Outcome: Progressing   

## 2020-12-01 LAB — BASIC METABOLIC PANEL
Anion gap: 9 (ref 5–15)
BUN: 13 mg/dL (ref 6–20)
CO2: 21 mmol/L — ABNORMAL LOW (ref 22–32)
Calcium: 7.9 mg/dL — ABNORMAL LOW (ref 8.9–10.3)
Chloride: 105 mmol/L (ref 98–111)
Creatinine, Ser: 1.19 mg/dL (ref 0.61–1.24)
GFR, Estimated: >60 mL/min (ref >60)
Glucose, Bld: 289 mg/dL — ABNORMAL HIGH (ref 70–99)
Potassium: 3.2 mmol/L — ABNORMAL LOW (ref 3.5–5.1)
Sodium: 135 mmol/L (ref 135–145)

## 2020-12-01 LAB — HEMOGLOBIN AND HEMATOCRIT, BLOOD
HCT: 29.7 % — ABNORMAL LOW (ref 39.0–52.0)
Hemoglobin: 9.4 g/dL — ABNORMAL LOW (ref 13.0–17.0)

## 2020-12-01 MED ORDER — HEPARIN SODIUM (PORCINE) 5000 UNIT/ML IJ SOLN
5000.0000 [IU] | Freq: Three times a day (TID) | INTRAMUSCULAR | Status: DC
  Administered 2020-12-01 – 2020-12-04 (×10): 5000 [IU] via SUBCUTANEOUS
  Filled 2020-12-01 (×10): qty 1

## 2020-12-01 NOTE — Progress Notes (Addendum)
2 Days Post-Op   Subjective/Chief Complaint:  1 - BCG-Refractory High Grade Bladder Cancer - T1G3 bladder cancer treated with TURBT + BCG x 6 and some maintenance 2020. Recurrence 04/2020 T1G3 with muscle in specimen and then new bilateral malignant hydro on CT 09/2020 (clinical stage 3). POD2 s/p robotic cystoprostatectomy with ileal conduit urinary diversion.  2 - Malignant Hydronephrosis - new bilateral moderate hydro to bladder on restaging CT 09/2020. Neph tubes placed 09/2020. Cr peak 4.8, Baseline Cr 1.1. Most recent UCX non-clonal. Neph tubes excahnged 11/19/20 as became dislodged.  Neph tubes capped, creatinine improving  3 - Prostate Screening - PSA 0.7 2019.    Today " Barry Gibson " is doing well. Reports that he had difficulty sleeping but his pain improved after IV pain medications and he was able to sleep. He did ambulate multiple times yesterday. No flatus yet or BM.   Hb 9.4 from 10.3, Cr 1.1 2.4L urine output 140cc from JP drain   Objective: Vital signs in last 24 hours: Temp:  [97.7 F (36.5 C)-98.6 F (37 C)] 98.6 F (37 C) (01/14 0528) Pulse Rate:  [80-94] 94 (01/14 0528) Resp:  [14-18] 18 (01/14 0528) BP: (140-152)/(85-87) 152/85 (01/14 0528) SpO2:  [91 %-94 %] 91 % (01/14 0528) Last BM Date: 11/28/20  Intake/Output from previous day: 01/13 0701 - 01/14 0700 In: 2503 [I.V.:2398.5; IV Piggyback:104.5] Out: 2540 [Urine:2400; Drains:140] Intake/Output this shift: Total I/O In: -  Out: 410 [Urine:400; Drains:10]  Physical Exam Vitals reviewed.  Constitutional:      Comments:  HENT:     Mouth/Throat:     Mouth: Mucous membranes are moist.  Eyes:     Pupils: Pupils are equal, round, and reactive to light.  Cardiovascular:     Rate and Rhythm: Normal rate.     Pulses: Normal pulses.  Pulmonary:     Effort: Pulmonary effort is normal on room air Abdominal:     General: Abdomen is soft, appropriately tender. Ostomy in RLQ, dark pink, healthy. Urine clear yellow  from ostomy. JP drain in place with serosanguinous output.  Genitourinary:    Penis: Normal.      Comments: Bilateral neph tubes capped Musculoskeletal:     Cervical back: Normal range of motion.     Comments: Stable LUE hand hypoplasia.   Neurological:     Mental Status: He is alert.  Psychiatric:        Mood and Affect: Mood normal.   Lab Results:  Recent Labs    11/28/20 1642 11/29/20 1526 11/30/20 0554 12/01/20 0558  WBC 8.1  --   --   --   HGB 12.5*   < > 10.3* 9.4*  HCT 38.2*   < > 32.0* 29.7*  PLT 421*  --   --   --    < > = values in this interval not displayed.   BMET Recent Labs    11/30/20 0554 12/01/20 0558  NA 139 135  K 4.0 3.2*  CL 106 105  CO2 23 21*  GLUCOSE 157* 289*  BUN 16 13  CREATININE 1.45* 1.19  CALCIUM 8.6* 7.9*   PT/INR No results for input(s): LABPROT, INR in the last 72 hours. ABG No results for input(s): PHART, HCO3 in the last 72 hours.  Invalid input(s): PCO2, PO2  Studies/Results: No results found.  Anti-infectives: Anti-infectives (From admission, onward)   Start     Dose/Rate Route Frequency Ordered Stop   11/30/20 0615  piperacillin-tazobactam (ZOSYN) IVPB 3.375 g  3.375 g 12.5 mL/hr over 240 Minutes Intravenous  Once 11/30/20 0519 11/30/20 1027   11/29/20 0700  piperacillin-tazobactam (ZOSYN) IVPB 3.375 g        3.375 g 12.5 mL/hr over 240 Minutes Intravenous Every 8 hours 11/28/20 1921 11/30/20 0659   11/29/20 0600  ceFAZolin (ANCEF) IVPB 2g/100 mL premix  Status:  Discontinued        2 g 200 mL/hr over 30 Minutes Intravenous On call to O.R. 11/28/20 1619 11/28/20 1650   11/28/20 2000  neomycin (MYCIFRADIN) tablet 1,000 mg        1,000 mg Oral Every 4 hours 11/28/20 1905 11/28/20 2344   11/28/20 2000  metroNIDAZOLE (FLAGYL) tablet 500 mg        500 mg Oral Every 4 hours 11/28/20 1905 11/28/20 2344      Assessment/Plan:  POD2 s/p robotic cystoprostatectomy with ileal conduit, recovering well.  - Continue  IVF, advance to clears - Bowel regimen, entereg until ROBF - Daily labs - Ostomy teaching - PT consult, OOB as able - SCD, SQH for ppx   Carmie Kanner 12/01/2020

## 2020-12-01 NOTE — Plan of Care (Signed)
  Problem: Health Behavior/Discharge Planning: Goal: Ability to manage health-related needs will improve Outcome: Progressing   Problem: Activity: Goal: Risk for activity intolerance will decrease Outcome: Progressing   Problem: Nutrition: Goal: Adequate nutrition will be maintained Outcome: Progressing   Problem: Elimination: Goal: Will not experience complications related to bowel motility Outcome: Progressing   Problem: Elimination: Goal: Will not experience complications related to urinary retention Outcome: Progressing   Problem: Pain Managment: Goal: General experience of comfort will improve Outcome: Progressing   

## 2020-12-01 NOTE — Consult Note (Signed)
[place] Nurse ostomy follow up Stoma type/location: RUQ ileal conduit  WIfe at bedside and will perform pouch change today with instruction.  Stomal assessment/size: 1"pink and moist  Stents in place draining dark yellow urine Peristomal assessment: intact  Treatment options for stomal/peristomal skin: barrier ring with 1 piece convex pouch Output yellow urine Ostomy pouching: 1pc.convex pouch LAWSON # [id number] barrier ring LAWOSN # [id number] LAWSON # [id number] adaptor Education provided: Pouch change performed.  Wife removed old pouch, cleansed with soapy water.  Measured and cut new pouch to fit. Applied with barrier ring.  Connected to bedside drainage.  Enrolled patient in [place] Start Discharge program: Yes today Discharge Monday. Supplies ordered for home.  Will follow.   Domenic Moras MSN, RN, FNP-BC CWON Wound, Ostomy, Continence Nurse Pager [phone]

## 2020-12-02 LAB — HEMOGLOBIN AND HEMATOCRIT, BLOOD
HCT: 32.6 % — ABNORMAL LOW (ref 39.0–52.0)
Hemoglobin: 10.6 g/dL — ABNORMAL LOW (ref 13.0–17.0)

## 2020-12-02 LAB — TYPE AND SCREEN
ABO/RH(D): O POS
Antibody Screen: NEGATIVE
Unit division: 0
Unit division: 0

## 2020-12-02 LAB — BPAM RBC
Blood Product Expiration Date: 202202142359
Blood Product Expiration Date: 202202162359
Unit Type and Rh: 5100
Unit Type and Rh: 5100

## 2020-12-02 LAB — BASIC METABOLIC PANEL
Anion gap: 9 (ref 5–15)
BUN: 12 mg/dL (ref 6–20)
CO2: 24 mmol/L (ref 22–32)
Calcium: 8.7 mg/dL — ABNORMAL LOW (ref 8.9–10.3)
Chloride: 103 mmol/L (ref 98–111)
Creatinine, Ser: 1.16 mg/dL (ref 0.61–1.24)
GFR, Estimated: >60 mL/min (ref >60)
Glucose, Bld: 105 mg/dL — ABNORMAL HIGH (ref 70–99)
Potassium: 3.1 mmol/L — ABNORMAL LOW (ref 3.5–5.1)
Sodium: 136 mmol/L (ref 135–145)

## 2020-12-02 MED ORDER — SENNOSIDES-DOCUSATE SODIUM 8.6-50 MG PO TABS: 2.0000 | ORAL_TABLET | Freq: Every evening | ORAL | Status: DC | PRN

## 2020-12-02 NOTE — Progress Notes (Signed)
3 Days Post-Op Subjective: No acute events overnight.  The patient has been ambulating without difficulty and was about to go on a walk when I entered the room.  Pain controlled.  He reports a good appetite and that he has been passing flatus.  States that he drank approximately 4 L of water yesterday.  No bowel movement yet.  Scant output from JP.   Objective: Vital signs in last 24 hours: Temp:  [98.9 F (37.2 C)-99.8 F (37.7 C)] 98.9 F (37.2 C) (01/15 0434) Pulse Rate:  [83-100] 83 (01/15 0434) Resp:  [16] 16 (01/15 0434) BP: (142-148)/(91-94) 145/94 (01/15 0920) SpO2:  [88 %-90 %] 90 % (01/15 0434)  Intake/Output from previous day: 01/14 0701 - 01/15 0700 In: 2475.4 [P.O.:360; I.V.:2115.4] Out: 6606 [Urine:4400; Drains:45]  Intake/Output this shift: No intake/output data recorded.  Physical Exam:  General: Alert and oriented CV: RRR, palpable distal pulses Lungs: CTAB, equal chest rise Abdomen: Soft, NTND, no rebound or guarding.  JP drain with serosanguineous output Incisions: Clean, dry and intact Gu: Urostomy is viable with bilateral stents in place and clear-yellow urine output.  Bilateral nephrostomy tubes capped. Ext: NT, No erythema  Lab Results: Recent Labs    11/30/20 0554 12/01/20 0558 12/02/20 0644  HGB 10.3* 9.4* 10.6*  HCT 32.0* 29.7* 32.6*   BMET Recent Labs    12/01/20 0558 12/02/20 0644  NA 135 136  K 3.2* 3.1*  CL 105 103  CO2 21* 24  GLUCOSE 289* 105*  BUN 13 12  CREATININE 1.19 1.16  CALCIUM 7.9* 8.7*     Studies/Results: No results found.  Assessment/Plan: Postop day 3 status post robotic cystoprostatectomy with ileal conduit urinary diversion  -Making excellent progress -Advance diet to regular.  If potassium remains low, will replace.  Currently on Entereg until ROBF -IV fluids to 50 mL/h.  I encouraged p.o. water intake -DC JP drain -Out of bed to chair and ambulate.  Subcu heparin for DVT prophylaxis.   -Continue  incentive spirometry -Continue to monitor   LOS: 4 days   Ellison Hughs, MD [facility] Pager: [phone]  12/02/2020, 11:11 AM

## 2020-12-02 NOTE — Plan of Care (Signed)
°  Problem: Education: Goal: Knowledge of General Education information will improve Description: Including pain rating scale, medication(s)/side effects and non-pharmacologic comfort measures Outcome: Progressing   Problem: Activity: Goal: Risk for activity intolerance will decrease Outcome: Progressing   Problem: Nutrition: Goal: Adequate nutrition will be maintained Outcome: Progressing   Problem: Elimination: Goal: Will not experience complications related to bowel motility Outcome: Progressing   Problem: Pain Managment: Goal: General experience of comfort will improve Outcome: Progressing   

## 2020-12-02 NOTE — Plan of Care (Signed)
  Problem: Education: Goal: Knowledge of General Education information will improve Description: Including pain rating scale, medication(s)/side effects and non-pharmacologic comfort measures Outcome: Progressing   Problem: Clinical Measurements: Goal: Diagnostic test results will improve Outcome: Progressing   Problem: Activity: Goal: Risk for activity intolerance will decrease Outcome: Progressing   Problem: Nutrition: Goal: Adequate nutrition will be maintained Outcome: Progressing   Problem: Elimination: Goal: Will not experience complications related to bowel motility Outcome: Progressing   Problem: Pain Managment: Goal: General experience of comfort will improve Outcome: Progressing   Problem: Safety: Goal: Ability to remain free from injury will improve Outcome: Progressing   Problem: Skin Integrity: Goal: Risk for impaired skin integrity will decrease Outcome: Progressing   

## 2020-12-03 LAB — BASIC METABOLIC PANEL
Anion gap: 11 (ref 5–15)
BUN: 14 mg/dL (ref 6–20)
CO2: 25 mmol/L (ref 22–32)
Calcium: 8.8 mg/dL — ABNORMAL LOW (ref 8.9–10.3)
Chloride: 100 mmol/L (ref 98–111)
Creatinine, Ser: 1.24 mg/dL (ref 0.61–1.24)
GFR, Estimated: >60 mL/min (ref >60)
Glucose, Bld: 103 mg/dL — ABNORMAL HIGH (ref 70–99)
Potassium: 3.1 mmol/L — ABNORMAL LOW (ref 3.5–5.1)
Sodium: 136 mmol/L (ref 135–145)

## 2020-12-03 LAB — HEMOGLOBIN AND HEMATOCRIT, BLOOD
HCT: 32.9 % — ABNORMAL LOW (ref 39.0–52.0)
Hemoglobin: 10.8 g/dL — ABNORMAL LOW (ref 13.0–17.0)

## 2020-12-03 NOTE — Plan of Care (Signed)
  Problem: Education: Goal: Knowledge of General Education information will improve Description: Including pain rating scale, medication(s)/side effects and non-pharmacologic comfort measures Outcome: Progressing   Problem: Clinical Measurements: Goal: Diagnostic test results will improve Outcome: Progressing   Problem: Activity: Goal: Risk for activity intolerance will decrease Outcome: Progressing   Problem: Nutrition: Goal: Adequate nutrition will be maintained Outcome: Progressing   Problem: Pain Managment: Goal: General experience of comfort will improve Outcome: Progressing   Problem: Skin Integrity: Goal: Risk for impaired skin integrity will decrease Outcome: Progressing   

## 2020-12-03 NOTE — Progress Notes (Signed)
4 Days Post-Op Subjective: No acute events overnight.  The patient is resting comfortably in bed and denies any significant pain.  He is tolerating a regular diet without nausea or vomiting.  He did have a small bowel movement yesterday and continues to pass flatus.  Overall, doing well.  Objective: Vital signs in last 24 hours: Temp:  [98.7 F (37.1 C)-99.4 F (37.4 C)] 98.7 F (37.1 C) (01/16 0655) Pulse Rate:  [86-95] 86 (01/16 0655) Resp:  [15-16] 15 (01/16 0655) BP: (130-151)/(84-94) 151/94 (01/16 0655) SpO2:  [90 %-93 %] 93 % (01/16 0655)  Intake/Output from previous day: 01/15 0701 - 01/16 0700 In: 1504.2 [I.V.:1504.2] Out: 2150 [Urine:2150]  Intake/Output this shift: No intake/output data recorded.  Physical Exam:  General: Alert and oriented CV: RRR, palpable distal pulses Lungs: CTAB, equal chest rise Abdomen: Soft, NTND, no rebound or guarding Incisions: Clean, dry and intact Gu: Urostomy is viable and draining amber urine Ext: NT, No erythema  Lab Results: Recent Labs    12/01/20 0558 12/02/20 0644 12/03/20 0526  HGB 9.4* 10.6* 10.8*  HCT 29.7* 32.6* 32.9*   BMET Recent Labs    12/02/20 0644 12/03/20 0526  NA 136 136  K 3.1* 3.1*  CL 103 100  CO2 24 25  GLUCOSE 105* 103*  BUN 12 14  CREATININE 1.16 1.24  CALCIUM 8.7* 8.8*     Studies/Results: No results found.  Assessment/Plan: Postop day 4 status post robotic cystoprostatectomy with ileal conduit urinary diversion  -He continues to make great progress. -Continue regular diet.  I encouraged p.o. water intake.  Continue IV fluids of 15 mL/h -Continue to ambulate ad lib.    LOS: 5 days   Ellison Hughs, MD [facility] Pager: [phone]  12/03/2020, 12:32 PM

## 2020-12-04 LAB — BASIC METABOLIC PANEL
Anion gap: 10 (ref 5–15)
BUN: 15 mg/dL (ref 6–20)
CO2: 22 mmol/L (ref 22–32)
Calcium: 7.9 mg/dL — ABNORMAL LOW (ref 8.9–10.3)
Chloride: 100 mmol/L (ref 98–111)
Creatinine, Ser: 1.22 mg/dL (ref 0.61–1.24)
GFR, Estimated: >60 mL/min (ref >60)
Glucose, Bld: 462 mg/dL — ABNORMAL HIGH (ref 70–99)
Potassium: 2.7 mmol/L — CL (ref 3.5–5.1)
Sodium: 132 mmol/L — ABNORMAL LOW (ref 135–145)

## 2020-12-04 LAB — HEMOGLOBIN AND HEMATOCRIT, BLOOD
HCT: 29.9 % — ABNORMAL LOW (ref 39.0–52.0)
Hemoglobin: 9.7 g/dL — ABNORMAL LOW (ref 13.0–17.0)

## 2020-12-04 MED ORDER — OXYCODONE-ACETAMINOPHEN 5-325 MG PO TABS: 1.0000 | ORAL_TABLET | ORAL | 0 refills | Status: DC | PRN

## 2020-12-04 MED ORDER — POTASSIUM CHLORIDE CRYS ER 20 MEQ PO TBCR
40.0000 meq | EXTENDED_RELEASE_TABLET | Freq: Two times a day (BID) | ORAL | Status: DC
  Administered 2020-12-04: 40 meq via ORAL
  Filled 2020-12-04: qty 2

## 2020-12-04 NOTE — Consult Note (Signed)
[place] Nurse ostomy follow up Teaching session performed with patient and wife; pt plans to discharge home today.  Stoma type/location: RUQ ileal conduit  Stomal assessment/size:  Stoma is red and viable; 1 inch, slightly above skin level. 2 stents in place draining dark yellow urine Peristomal assessment: intact  Treatment options for stomal/peristomal skin: barrier ring with 1 piece convex pouch Ostomy pouching: 1pc.convex pouch LAWSON # [id number] barrier ring LAWSON # [id number] LAWSON # [id number] adaptor Education provided: Pouch change performed.  Pt and wife performed with minimal assistance.  Applied barrier ring and one piece convex pouch using hand held mirror to visualize.  Pt was able to open and close spout to empty and attach and disconnect bedside drainage bag to empty. Reviewed pouching routines and ordering supplies.  5 sets of barrier rings and pouches, 1 adapter, and one belt left at the bedside for use after discharge.  Enrolled patient in [place] Start Discharge program: Yes, previously Julien Girt MSN, IXL, [place], [place], [place]

## 2020-12-04 NOTE — TOC Transition Note (Signed)
Transition of Care [facility]) - CM/SW Discharge Note   Patient Details  Name: Barry Gibson MRN: [id number] Date of Birth: 12-Feb-1961  Transition of Care [facility]) CM/SW Contact:  Ross Ludwig, LCSW Phone Number: 12/04/2020, 3:33 PM   Clinical Narrative:     Patient will be going home with home health through [place].  CSW signing off please reconsult with any other social work needs, home health agency has been notified of planned discharge.  Patient's wife was notified of discharge and home health agency.    Final next level of care: [place] Barriers to Discharge: Barriers Resolved   Patient Goals and CMS Choice Patient states their goals for this hospitalization and ongoing recovery are:: To return back home with his wife. CMS Medicare.gov Compare Post Acute Care list provided to:: Patient Represenative (must comment) Choice offered to / list presented to : Spouse  Discharge Placement  Patient discharging back home with his wife.                     Discharge Plan and Services                          HH Arranged: RN [facility] Agency: [place] Date [facility] Agency Contacted: 12/04/20 Time [place]: [id number] Representative spoke with at [place]: Cindie  Social Determinants of Health ([place]) Interventions     Readmission Risk Interventions No flowsheet data found.

## 2020-12-04 NOTE — Discharge Summary (Signed)
Physician Discharge Summary  Patient ID: Barry Gibson MRN: [id number] DOB/AGE: 06-13-1961 60 y.o.  Admit date: 11/28/2020 Discharge date: 12/04/2020  Admission Diagnoses: Bladder Cancer  Discharge Diagnoses:  Active Problems:   Bladder cancer [facility])   Discharged Condition: good  Hospital Course:  Pt underwent robotic cystoprostatectomy, node dissection, ileal conduit urinary diversion on 11/29/20 for advanced bladder cancer. Was admitted 1/11 for pre-op bowel prep and stomal marking. He was admitted to 4th floor progressive care unit post-op where he began his vigorous recovery. Initially NPO, advanced to clears, and then regular diet after flatus and BM. Tolerating regular diet at discharge. Hgb 9.7, Cr 1.2, Final path pending at discharge. PT eval felt no needs. Ostomy RN teaching in house with plan for home health RN at discharge. In total, he had uneventful inpatient recovery after major extirpative surgery.  Consults: physical therapy, case management, ostomy RN  Significant Diagnostic Studies: labs: as per above  Treatments: surgery: as per above  Discharge Exam: Blood pressure 139/87, pulse 83, temperature 98.6 F (37 C), resp. rate 14, height 5\' 11"  (1.803 m), weight 95.8 kg, SpO2 95 %. General appearance: alert, cooperative and very pleasant and in good spirits, at baseline.  Eyes: negative Nose: Nares normal. Septum midline. Mucosa normal. No drainage or sinus tenderness. Throat: lips, mucosa, and tongue normal; teeth and gums normal Neck: supple, symmetrical, trachea midline Back: symmetric, no curvature. ROM normal. No CVA tenderness. Resp: non-labored on room air.  Cardio: Nl rate GI: soft, non-tender; bowel sounds normal; no masses,  no organomegaly and recent port and extraction sites c/d/i. RLQ Urostomy pink and patent with Rt (red) and Lt (blue) bander stents and copious non-foul uriine and scant mucus.  Male genitalia: normal Extremities: extremities normal,  atraumatic, no cyanosis or edema and stable LUE hypoplasia (congenital) Skin: Skin color, texture, turgor normal. No rashes or lesions Lymph nodes: Cervical, supraclavicular, and axillary nodes normal. Neurologic: Grossly normal Bilateral neph tubes capped.   Disposition: HOME     Follow-up Information    Alexis Frock, MD On 12/21/2020.   Specialty: Urology Why: at 11:30 for MD visit.  Contact information: [address] [place] [address] [phone]               Signed: Alexis Frock 12/04/2020, 9:24 AM

## 2020-12-04 NOTE — Progress Notes (Signed)
CRITICAL VALUE ALERT  Critical Value:  Potassium 2.7  Date & Time Notied:  12/04/20, 0805  Provider Notified: MD Manny  Orders Received/Actions taken: Awaiting new orders.

## 2020-12-06 ENCOUNTER — Other Ambulatory Visit: Payer: Self-pay | Admitting: Adult Health

## 2020-12-06 ENCOUNTER — Telehealth: Payer: Self-pay

## 2020-12-06 DIAGNOSIS — E876 Hypokalemia: Secondary | ICD-10-CM

## 2020-12-07 LAB — SURGICAL PATHOLOGY

## 2020-12-25 ENCOUNTER — Other Ambulatory Visit (HOSPITAL_COMMUNITY): Payer: BC Managed Care – PPO

## 2021-01-31 DIAGNOSIS — Z029 Encounter for administrative examinations, unspecified: Secondary | ICD-10-CM

## 2021-03-20 ENCOUNTER — Other Ambulatory Visit: Payer: Self-pay | Admitting: Urology

## 2021-03-20 ENCOUNTER — Other Ambulatory Visit (HOSPITAL_COMMUNITY): Payer: Self-pay | Admitting: Urology

## 2021-03-20 DIAGNOSIS — C678 Malignant neoplasm of overlapping sites of bladder: Secondary | ICD-10-CM

## 2021-03-20 DIAGNOSIS — C775 Secondary and unspecified malignant neoplasm of intrapelvic lymph nodes: Secondary | ICD-10-CM

## 2021-04-06 ENCOUNTER — Other Ambulatory Visit: Payer: Self-pay

## 2021-04-06 ENCOUNTER — Ambulatory Visit (INDEPENDENT_AMBULATORY_CARE_PROVIDER_SITE_OTHER): Payer: BC Managed Care – PPO | Admitting: Family Medicine

## 2021-04-06 ENCOUNTER — Encounter: Payer: Self-pay | Admitting: Family Medicine

## 2021-04-06 VITALS — BP 141/93 | HR 86 | Temp 98.2°F | Ht 71.0 in | Wt 211.0 lb

## 2021-04-06 DIAGNOSIS — F419 Anxiety disorder, unspecified: Secondary | ICD-10-CM | POA: Diagnosis not present

## 2021-04-06 DIAGNOSIS — C679 Malignant neoplasm of bladder, unspecified: Secondary | ICD-10-CM

## 2021-04-06 DIAGNOSIS — F32A Depression, unspecified: Secondary | ICD-10-CM

## 2021-04-06 MED ORDER — SERTRALINE HCL 50 MG PO TABS: ORAL_TABLET | ORAL | 0 refills | Status: DC

## 2021-04-06 NOTE — Progress Notes (Signed)
Patient ID: Barry Gibson, male    DOB: 1961/10/24, 60 y.o.   MRN: [id number]   Chief Complaint  Patient presents with  . Depression   Subjective:    HPI  CC-depression.  Pt states his wife told him to come because they are arguing too much.  Pt states he lets things get to him due to him having cancer.  Jan had prostate and bladder surgery. June going back for more tests.   Wife and pt are arguing more.  More angry when she tries to console him or tries to help.  Feeling depressed with surgery for his bladder cancer and having a bag for urinate and not able the do the things he used to do. Feeling upset not able to have intercourse like he used to.  told that the oral medications for ED won't help with this. Cant get into the office with Urology till June and feeling stressed with waiting for his appts.  Pt wanting to start a medication to help with symptoms of depression/anxiety.  Medical History Barry Gibson has a past medical history of Cancer [facility]), Cataract, COVID-19 (11/13/2019), Frequent urination, Fuchs' corneal dystrophy, Full dentures, GERD (gastroesophageal reflux disease), Hypertension, and Rheumatoid arthritis ([place]).   Outpatient Encounter Medications as of 04/06/2021  Medication Sig  . acetaminophen (TYLENOL) 650 MG CR tablet Take 1,300 mg by mouth every morning.  Marland Kitchen amLODipine (NORVASC) 10 MG tablet Take 1 tablet (10 mg total) by mouth daily. For BP  . leflunomide (ARAVA) 20 MG tablet Take 20 mg by mouth daily.  Marland Kitchen omeprazole (PRILOSEC) 40 MG capsule Take 40 mg by mouth daily.  . pravastatin (PRAVACHOL) 40 MG tablet Take 1 tablet (40 mg total) by mouth daily.  . prednisoLONE acetate (PRED FORTE) 1 % ophthalmic suspension Place 1 drop into the left eye 2 (two) times a week. Instill 1 drop into left eye Monday and Friday.  . sertraline (ZOLOFT) 50 MG tablet Take 1/2 tab p.o. 7 days, then increase 1 tab daily.  . [DISCONTINUED] oxyCODONE-acetaminophen (PERCOCET) 5-325 MG  tablet Take 1 tablet by mouth every 4 (four) hours as needed for severe pain.   Facility-Administered Encounter Medications as of 04/06/2021  Medication  . gemcitabine (GEMZAR) chemo syringe for bladder instillation 2,000 mg  . gemcitabine (GEMZAR) chemo syringe for bladder instillation 2,000 mg     Review of Systems  Constitutional: Negative for chills and fever.  HENT: Negative for congestion, rhinorrhea and sore throat.   Respiratory: Negative for cough, shortness of breath and wheezing.   Cardiovascular: Negative for chest pain and leg swelling.  Gastrointestinal: Negative for abdominal pain, diarrhea, nausea and vomiting.  Genitourinary: Negative for dysuria and frequency.  Skin: Negative for rash.  Neurological: Negative for dizziness, weakness and headaches.  Psychiatric/Behavioral: Positive for agitation and dysphoric mood. Negative for self-injury, sleep disturbance and suicidal ideas. The patient is nervous/anxious.      Vitals BP (!) 141/93   Pulse 86   Temp 98.2 F (36.8 C)   Ht 5\' 11"  (1.803 m)   Wt 211 lb (95.7 kg)   SpO2 97%   BMI 29.43 kg/m   Objective:   Physical Exam Vitals and nursing note reviewed.  Constitutional:      General: He is not in acute distress.    Appearance: Normal appearance. He is not ill-appearing.  HENT:     Head: Normocephalic.     Nose: Nose normal. No congestion.     Mouth/Throat:  Mouth: Mucous membranes are moist.     Pharynx: No oropharyngeal exudate.  Eyes:     Extraocular Movements: Extraocular movements intact.     Conjunctiva/sclera: Conjunctivae normal.     Pupils: Pupils are equal, round, and reactive to light.  Cardiovascular:     Rate and Rhythm: Normal rate and regular rhythm.     Pulses: Normal pulses.     Heart sounds: Normal heart sounds. No murmur heard.   Pulmonary:     Effort: Pulmonary effort is normal.     Breath sounds: Normal breath sounds. No wheezing, rhonchi or rales.  Musculoskeletal:         General: Normal range of motion.     Right lower leg: No edema.     Left lower leg: No edema.  Skin:    General: Skin is warm and dry.     Findings: No rash.  Neurological:     General: No focal deficit present.     Mental Status: He is alert and oriented to person, place, and time.     Cranial Nerves: No cranial nerve deficit.  Psychiatric:        Behavior: Behavior normal.        Thought Content: Thought content normal.        Judgment: Judgment normal.     Comments: +depressed mood.      Assessment and Plan   1. Depression, unspecified depression type - sertraline (ZOLOFT) 50 MG tablet; Take 1/2 tab p.o. 7 days, then increase 1 tab daily.  Dispense: 30 tablet; Refill: 0  2. Anxiety - sertraline (ZOLOFT) 50 MG tablet; Take 1/2 tab p.o. 7 days, then increase 1 tab daily.  Dispense: 30 tablet; Refill: 0  3. Malignant neoplasm of urinary bladder, unspecified site [facility])    Depression/anxiety- will start zoloft 50mg .   Return in about 1 month (around 05/07/2021) for f/u anxiety/depression.  04/06/2021

## 2021-04-10 ENCOUNTER — Other Ambulatory Visit (HOSPITAL_COMMUNITY)
Admission: RE | Admit: 2021-04-10 | Discharge: 2021-04-10 | Disposition: A | Discharge status: Home / Self Care | Payer: BC Managed Care – PPO | Source: Ambulatory Visit | Attending: Urology | Admitting: Urology

## 2021-04-10 ENCOUNTER — Other Ambulatory Visit: Payer: Self-pay | Admitting: Family Medicine

## 2021-04-10 ENCOUNTER — Other Ambulatory Visit: Payer: Self-pay

## 2021-04-10 DIAGNOSIS — C678 Malignant neoplasm of overlapping sites of bladder: Secondary | ICD-10-CM | POA: Diagnosis present

## 2021-04-10 LAB — COMPREHENSIVE METABOLIC PANEL
ALT: 14 U/L (ref 0–44)
AST: 15 U/L (ref 15–41)
Albumin: 4 g/dL (ref 3.5–5.0)
Alkaline Phosphatase: 76 U/L (ref 38–126)
Anion gap: 7 (ref 5–15)
BUN: 18 mg/dL (ref 6–20)
CO2: 27 mmol/L (ref 22–32)
Calcium: 9.1 mg/dL (ref 8.9–10.3)
Chloride: 107 mmol/L (ref 98–111)
Creatinine, Ser: 1.16 mg/dL (ref 0.61–1.24)
GFR, Estimated: >60 mL/min (ref >60)
Glucose, Bld: 100 mg/dL — ABNORMAL HIGH (ref 70–99)
Potassium: 4.1 mmol/L (ref 3.5–5.1)
Sodium: 141 mmol/L (ref 135–145)
Total Bilirubin: 0.7 mg/dL (ref 0.3–1.2)
Total Protein: 7.7 g/dL (ref 6.5–8.1)

## 2021-04-19 ENCOUNTER — Ambulatory Visit (HOSPITAL_COMMUNITY)
Admission: RE | Admit: 2021-04-19 | Discharge: 2021-04-19 | Disposition: A | Discharge status: Home / Self Care | Payer: BC Managed Care – PPO | Source: Ambulatory Visit | Attending: Urology | Admitting: Urology

## 2021-04-19 ENCOUNTER — Other Ambulatory Visit: Payer: Self-pay

## 2021-04-19 DIAGNOSIS — C775 Secondary and unspecified malignant neoplasm of intrapelvic lymph nodes: Secondary | ICD-10-CM

## 2021-04-19 DIAGNOSIS — C678 Malignant neoplasm of overlapping sites of bladder: Secondary | ICD-10-CM | POA: Diagnosis present

## 2021-04-19 MED ORDER — IOHEXOL 300 MG/ML  SOLN
100.0000 mL | Freq: Once | INTRAMUSCULAR | Status: AC | PRN
  Administered 2021-04-19: 100 mL via INTRAVENOUS

## 2021-05-07 ENCOUNTER — Encounter: Payer: Self-pay | Admitting: Family Medicine

## 2021-05-07 ENCOUNTER — Ambulatory Visit (INDEPENDENT_AMBULATORY_CARE_PROVIDER_SITE_OTHER): Payer: BC Managed Care – PPO | Admitting: Family Medicine

## 2021-05-07 ENCOUNTER — Other Ambulatory Visit: Payer: Self-pay

## 2021-05-07 VITALS — BP 133/85 | HR 88 | Temp 98.2°F | Ht 71.0 in | Wt 215.2 lb

## 2021-05-07 DIAGNOSIS — I10 Essential (primary) hypertension: Secondary | ICD-10-CM

## 2021-05-07 DIAGNOSIS — F32A Depression, unspecified: Secondary | ICD-10-CM

## 2021-05-07 DIAGNOSIS — C679 Malignant neoplasm of bladder, unspecified: Secondary | ICD-10-CM

## 2021-05-07 DIAGNOSIS — F419 Anxiety disorder, unspecified: Secondary | ICD-10-CM

## 2021-05-07 MED ORDER — PRAVASTATIN SODIUM 40 MG PO TABS: ORAL_TABLET | ORAL | 1 refills | Status: DC

## 2021-05-07 MED ORDER — AMLODIPINE BESYLATE 10 MG PO TABS: 10.0000 mg | ORAL_TABLET | Freq: Every day | ORAL | 1 refills | Status: DC

## 2021-05-07 MED ORDER — SERTRALINE HCL 50 MG PO TABS
50.0000 mg | ORAL_TABLET | Freq: Every day | ORAL | 1 refills | Status: DC
Start: 2021-05-07 — End: 2021-08-06

## 2021-05-07 NOTE — Progress Notes (Signed)
Patient ID: Barry Gibson, male    DOB: May 08, 1961, 60 y.o.   MRN: [id number]   Chief Complaint  Patient presents with   Depression    Anxiety follow up   Subjective:   HPI  F/u depression/anxiety- Patient states he is doing well on the med and seems to feel calmer  Pt stating has worked for calmed him down.   Pt not work as much as he was and is helping.  Pt is dealing with h/o bladder cancer and is following up with urology soon for repeat CT scan tomorrow.  Feels  zoloft is helping.  Not as agitated.  Feeling 50mg  zoloft is working well.   Medical History Barry Gibson has a past medical history of Cancer Barry Gibson), Cataract, COVID-19 (11/13/2019), Frequent urination, Fuchs' corneal dystrophy, Full dentures, GERD (gastroesophageal reflux disease), Hypertension, and Rheumatoid arthritis (Barry Gibson).   Outpatient Encounter Medications as of 05/07/2021  Medication Sig   acetaminophen (TYLENOL) 650 MG CR tablet Take 1,300 mg by mouth every morning.   amLODipine (NORVASC) 10 MG tablet Take 1 tablet (10 mg total) by mouth daily.   leflunomide (ARAVA) 20 MG tablet Take 20 mg by mouth daily.   omeprazole (PRILOSEC) 40 MG capsule Take 40 mg by mouth daily.   pravastatin (PRAVACHOL) 40 MG tablet TAKE 1 TABLET(40 MG) BY MOUTH DAILY   prednisoLONE acetate (PRED FORTE) 1 % ophthalmic suspension Place 1 drop into the left eye 2 (two) times a week. Instill 1 drop into left eye Monday and Friday.   sertraline (ZOLOFT) 50 MG tablet Take 1 tablet (50 mg total) by mouth daily.   [DISCONTINUED] amLODipine (NORVASC) 10 MG tablet TAKE 1 TABLET(10 MG) BY MOUTH DAILY FOR BLOOD PRESSURE   [DISCONTINUED] pravastatin (PRAVACHOL) 40 MG tablet TAKE 1 TABLET(40 MG) BY MOUTH DAILY   [DISCONTINUED] sertraline (ZOLOFT) 50 MG tablet Take 1/2 tab p.o. 7 days, then increase 1 tab daily.   Facility-Administered Encounter Medications as of 05/07/2021  Medication   gemcitabine (GEMZAR) chemo syringe for bladder instillation  2,000 mg   gemcitabine (GEMZAR) chemo syringe for bladder instillation 2,000 mg     Review of Systems  Constitutional:  Negative for chills and fever.  HENT:  Negative for congestion, rhinorrhea and sore throat.   Respiratory:  Negative for cough, shortness of breath and wheezing.   Cardiovascular:  Negative for chest pain and leg swelling.  Gastrointestinal:  Negative for abdominal pain, diarrhea, nausea and vomiting.  Genitourinary:  Negative for dysuria and frequency.  Skin:  Negative for rash.  Neurological:  Negative for dizziness, weakness and headaches.  Psychiatric/Behavioral:  Positive for agitation (improved with meds) and dysphoric mood (improved with meds). Negative for self-injury, sleep disturbance and suicidal ideas. The patient is not nervous/anxious.     Vitals BP 133/85   Pulse 88   Temp 98.2 F (36.8 C) (Oral)   Ht 5\' 11"  (1.803 m)   Wt 215 lb 3.2 oz (97.6 kg)   SpO2 98%   BMI 30.01 kg/m   Objective:   Physical Exam Vitals and nursing note reviewed.  Constitutional:      General: He is not in acute distress.    Appearance: Normal appearance. He is not ill-appearing.  Cardiovascular:     Rate and Rhythm: Normal rate and regular rhythm.     Pulses: Normal pulses.     Heart sounds: Normal heart sounds.  Pulmonary:     Effort: Pulmonary effort is normal. No respiratory distress.  Breath sounds: Normal breath sounds.  Musculoskeletal:        General: Normal range of motion.  Skin:    General: Skin is warm and dry.     Findings: No rash.  Neurological:     General: No focal deficit present.     Mental Status: He is alert and oriented to person, place, and time.     Cranial Nerves: No cranial nerve deficit.  Psychiatric:        Mood and Affect: Mood normal.        Behavior: Behavior normal.        Thought Content: Thought content normal.        Judgment: Judgment normal.     Assessment and Plan   1. Depression, unspecified depression type -  sertraline (ZOLOFT) 50 MG tablet; Take 1 tablet (50 mg total) by mouth daily.  Dispense: 90 tablet; Refill: 1  2. Anxiety - sertraline (ZOLOFT) 50 MG tablet; Take 1 tablet (50 mg total) by mouth daily.  Dispense: 90 tablet; Refill: 1  3. Essential hypertension, benign  4. Malignant neoplasm of urinary bladder, unspecified site Barry Gibson)   Dep/anxiety-improving with medications.  May need to increase to 75-100mg , but pt wanting to hold at this time and see how he's doing next visit.  Htn- stable. Cont meds. HLD- stable. Cont meds. Will need labs next visit.  Refilled cholesterol med and bp meds.   Bladder cancer- with metastatic disease on CT 04/19/21 -pt is stage IV at this time. -f/u with urology/oncology.  Return in about 3 months (around 08/07/2021), or if symptoms worsen or fail to improve.

## 2021-05-11 ENCOUNTER — Telehealth: Payer: Self-pay | Admitting: Oncology

## 2021-05-11 NOTE — Telephone Encounter (Signed)
Received a new pt referral from Dr. Tresa Moore at [facility] for bladder cancer. [name] has been scheduled to see Dr. Alen Blew on 7/1 at 2pm. Appt date and time given to the pt's wife. Aware to arrive 20 minutes early.

## 2021-05-18 ENCOUNTER — Other Ambulatory Visit: Payer: Self-pay

## 2021-05-18 ENCOUNTER — Other Ambulatory Visit: Payer: Self-pay | Admitting: Oncology

## 2021-05-18 ENCOUNTER — Inpatient Hospital Stay: Payer: BC Managed Care – PPO | Attending: Oncology | Admitting: Oncology

## 2021-05-18 VITALS — BP 134/89 | HR 73 | Temp 97.7°F | Resp 18 | Ht 71.0 in | Wt 216.5 lb

## 2021-05-18 DIAGNOSIS — Z5111 Encounter for antineoplastic chemotherapy: Secondary | ICD-10-CM | POA: Insufficient documentation

## 2021-05-18 DIAGNOSIS — C778 Secondary and unspecified malignant neoplasm of lymph nodes of multiple regions: Secondary | ICD-10-CM | POA: Diagnosis not present

## 2021-05-18 DIAGNOSIS — C679 Malignant neoplasm of bladder, unspecified: Secondary | ICD-10-CM | POA: Insufficient documentation

## 2021-05-18 DIAGNOSIS — C670 Malignant neoplasm of trigone of bladder: Secondary | ICD-10-CM | POA: Diagnosis not present

## 2021-05-18 DIAGNOSIS — Z87891 Personal history of nicotine dependence: Secondary | ICD-10-CM | POA: Insufficient documentation

## 2021-05-18 MED ORDER — PROCHLORPERAZINE MALEATE 10 MG PO TABS: 10.0000 mg | ORAL_TABLET | Freq: Four times a day (QID) | ORAL | 0 refills | Status: DC | PRN

## 2021-05-18 MED ORDER — LIDOCAINE-PRILOCAINE 2.5-2.5 % EX CREA: 1.0000 "application " | TOPICAL_CREAM | CUTANEOUS | 0 refills | Status: DC | PRN

## 2021-05-18 NOTE — Progress Notes (Signed)
Reason for the request:    Bladder cancer  HPI: I was asked by Dr. Tresa Moore to evaluate Mr. Buhl for evaluation of bladder cancer.  He is a 60 year old man with history of rheumatoid arthritis, hypertension and GERD as well as recurrent bladder cancer.  He has developed high-grade nonmuscle invasive disease and was treated with BCG and 2020 with recurrence in 2021.  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  He is subsequently underwent robotic assisted laparoscopic radical cystectomy and lymphadenectomy January 2022.  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  He had 4 out of 7 lymph nodes involved with the final pathological staging is T4N2 disease.  He underwent staging scans on April 19, 2021 which showed interval progression of abdominal pelvic adenopathy consistent with worsening nodal metastasis.  Aortocaval lymph node at the level of the mid kidney measuring 1.5 cm with right pelvic sidewall lymph node measuring 1.6 cm.  There is also retroperitoneal mesenteric and pelvic soft tissue stranding with prominent mesenteric lymph nodes although no adenopathy by size criteria.  Clinically, he reports feeling well without any residual issues or complaints.  He denies any nausea, abdominal pain or hematuria.  He denies any other genitourinary complaints.  He denies any bone pain or pathological fractures.  His performance status quality of life remain excellent.  He does not report any headaches, blurry vision, syncope or seizures. Does not report any fevers, chills or sweats.  Does not report any cough, wheezing or hemoptysis.  Does not report any chest pain, palpitation, orthopnea or leg edema.  Does not report any nausea, vomiting or abdominal pain.  Does not report any constipation or diarrhea.  Does not report any skeletal  complaints.    Does not report frequency, urgency or hematuria.  Does not report any skin rashes or lesions. Does not report any heat or cold intolerance.  Does not report any lymphadenopathy or petechiae.  Does not report any anxiety or depression.  Remaining review of systems is negative.     Past Medical History:  Diagnosis Date   Cancer [facility])    bladder   Cataract    COVID-19 11/13/2019   h/a, chills, fatigue, loss of taste and smell, all symptoms resolved in a few weeks   Frequent urination    Fuchs' corneal dystrophy    Full dentures    GERD (gastroesophageal reflux disease)    Hypertension    Rheumatoid arthritis ([place])   :   Past Surgical History:  Procedure Laterality Date   CORNEAL TRANSPLANT Left 2013   CYSTOSCOPY WITH BIOPSY N/A 09/14/2019   Procedure: CYSTOSCOPY WITH BIOPSY;  Surgeon: Franchot Gallo, MD;  Location: [facility];  Service: Urology;  Laterality: N/A;   CYSTOSCOPY WITH INJECTION N/A 11/29/2020   Procedure: CYSTOSCOPY WITH INJECTION OF INDOCYANINE GREEN DYE;  Surgeon: Alexis Frock, MD;  Location: [facility];  Service: Urology;  Laterality: N/A;  6 HRS   FULGURATION OF BLADDER TUMOR N/A 09/14/2019   Procedure: FULGURATION OF BLADDER TUMOR;  Surgeon: Franchot Gallo, MD;  Location: [facility];  Service: Urology;  Laterality: N/A;   IR NEPHROSTOMY EXCHANGE LEFT  10/30/2020   IR NEPHROSTOMY EXCHANGE LEFT  11/19/2020   IR NEPHROSTOMY EXCHANGE RIGHT  10/30/2020   IR NEPHROSTOMY EXCHANGE RIGHT  11/19/2020   IR NEPHROSTOMY PLACEMENT LEFT  09/27/2020   IR NEPHROSTOMY PLACEMENT RIGHT  09/27/2020  ROBOT ASSISTED LAPAROSCOPIC COMPLETE CYSTECT ILEAL CONDUIT N/A 11/29/2020   Procedure: XI ROBOTIC ASSISTED LAPAROSCOPIC COMPLETE CYSTECT ILEAL CONDUIT/RADICAL PROSTATECTOMY/LYMPHADENECTOMY;  Surgeon: Alexis Frock, MD;  Location: [facility];  Service: Urology;  Laterality: N/A;   TRANSURETHRAL RESECTION OF BLADDER TUMOR WITH MITOMYCIN-C N/A 08/05/2019   Procedure: TRANSURETHRAL  RESECTION OF BLADDER TUMOR;  Surgeon: Franchot Gallo, MD;  Location[facility];  Service: Urology;  Laterality: N/A;  1 HR   TRANSURETHRAL RESECTION OF BLADDER TUMOR WITH MITOMYCIN-C N/A 04/24/2020   Procedure: TRANSURETHRAL RESECTION OF BLADDER TUMOR WITH GEMCIDABINE;  Surgeon: Franchot Gallo, MD;  Location: [facility];  Service: Urology;  Laterality: N/A;  :   Current Outpatient Medications:    acetaminophen (TYLENOL) 650 MG CR tablet, Take 1,300 mg by mouth every morning., Disp: , Rfl:    amLODipine (NORVASC) 10 MG tablet, Take 1 tablet (10 mg total) by mouth daily., Disp: 90 tablet, Rfl: 1   leflunomide (ARAVA) 20 MG tablet, Take 20 mg by mouth daily., Disp: , Rfl:    omeprazole (PRILOSEC) 40 MG capsule, Take 40 mg by mouth daily., Disp: , Rfl:    pravastatin (PRAVACHOL) 40 MG tablet, TAKE 1 TABLET(40 MG) BY MOUTH DAILY, Disp: 90 tablet, Rfl: 1   prednisoLONE acetate (PRED FORTE) 1 % ophthalmic suspension, Place 1 drop into the left eye 2 (two) times a week. Instill 1 drop into left eye Monday and Friday., Disp: , Rfl:    sertraline (ZOLOFT) 50 MG tablet, Take 1 tablet (50 mg total) by mouth daily., Disp: 90 tablet, Rfl: 1 No current facility-administered medications for this visit.  Facility-Administered Medications Ordered in Other Visits:    gemcitabine (GEMZAR) chemo syringe for bladder instillation 2,000 mg, 2,000 mg, Bladder Instillation, Once, Dahlstedt, Stephen, MD   gemcitabine [facility]) chemo syringe for bladder instillation 2,000 mg, 2,000 mg, Bladder Instillation, Once, Franchot Gallo, MD:   Allergies  Allergen Reactions   Ace Inhibitors Cough   Diflucan [Fluconazole] Other (See Comments)    Irritated ulcers  :  No family history on file.:   Social History   Socioeconomic History   Marital status: Married    Spouse name: Not on file   Number of children: Not on file   Years of education: Not on file   Highest education  level: Not on file  Occupational History   Not on file  Tobacco Use   Smoking status: Former    Packs/day: 0.50    Years: 20.00    Pack years: 10.00    Types: Cigarettes   Smokeless tobacco: Never   Tobacco comments:    quit 2010  Vaping Use   Vaping Use: Never used  Substance and Sexual Activity   Alcohol use: Yes    Comment: occ  2 x month   Drug use: No   Sexual activity: Yes  Other Topics Concern   Not on file  Social History Narrative   Not on file   Social Determinants of Health   Financial Resource Strain: Not on file  Food Insecurity: Not on file  Transportation Needs: Not on file  Physical Activity: Not on file  Stress: Not on file  Social Connections: Not on file  Intimate Partner Violence: Not on file  :  Pertinent items are noted in HPI.  Exam: Blood pressure 134/89, pulse 73, temperature 97.7 F (36.5 C), temperature source Oral, resp. rate 18, height 5\' 11"  (1.803 m), weight 216 lb 8 oz (98.2 kg), SpO2 98 %.  ECOG 0 General appearance: alert and cooperative appeared without distress. Head: atraumatic without any abnormalities. Eyes: conjunctivae/corneas clear. PERRL.  Sclera anicteric. Throat: lips, mucosa, and tongue normal; without oral thrush or ulcers. Resp: clear to auscultation bilaterally without rhonchi, wheezes or dullness to percussion. Cardio: regular rate and rhythm, S1, S2 normal, no murmur, click, rub or gallop GI: soft, non-tender; bowel sounds normal; no masses,  no organomegaly Skin: Skin color, texture, turgor normal. No rashes or lesions Lymph nodes: Cervical, supraclavicular, and axillary nodes normal. Neurologic: Grossly normal without any motor, sensory or deep tendon reflexes. Musculoskeletal: No joint deformity or effusion.   CT ABDOMEN PELVIS W WO CONTRAST  Result Date: 04/20/2021 CLINICAL DATA:  History of bladder cancer status post cysto prostatectomy with pelvic lymph node dissection and ileal conduit formation. EXAM:  CT CHEST WITH CONTRAST CT ABDOMEN AND PELVIS WITH AND WITHOUT CONTRAST TECHNIQUE: Multidetector CT imaging of the chest was performed during intravenous contrast administration. Multidetector CT imaging of the abdomen and pelvis was performed following the standard protocol before and during bolus administration of intravenous contrast. CONTRAST:  153mL OMNIPAQUE IOHEXOL 300 MG/ML  SOLN COMPARISON:  CT November 18, 2020 FINDINGS: CT CHEST FINDINGS Cardiovascular: No thoracic aortic aneurysm. No central pulmonary embolus. LAD and left circumflex coronary artery calcifications. Normal size heart. Trace pericardial effusion, likely physiologic. Mediastinum/Nodes: No discrete thyroid nodularity. No supraclavicular adenopathy. No axillary adenopathy. No mediastinal or hilar adenopathy. The trachea esophagus are grossly unremarkable. Lungs/Pleura: Mild emphysematous change. No airspace consolidation. No suspicious pulmonary nodules or masses. No pleural effusion. No pneumothorax. Musculoskeletal: No chest wall mass. CT ABDOMEN AND PELVIS FINDINGS Hepatobiliary: No suspicious hepatic lesion. Cholelithiasis measuring 8 mm without evidence of acute cholecystitis. No biliary ductal dilation. Pancreas: Within normal limits. Spleen: Within normal limits. Adrenals/Urinary Tract: Bilateral adrenal glands are unremarkable. Postsurgical change of cystoprostatectomy with ileal conduit formation in the right anterior abdominal wall. No hydronephrosis. Symmetric enhancement excretion of contrast in the bilateral kidneys. No suspicious filling defect visualized within the opacified portions of the collecting system or ureters on delayed imaging. However, the distal right ureter is not opacified on delayed imaging limiting evaluation. Bilateral renal cysts measuring 11 mm on the left and 13 mm on the right. Stomach/Bowel: The stomach is grossly unremarkable for degree of distension. No pathologic dilation of small bowel. Normal appendix.  Surgical changes of ileal conduit formation. Colonic diverticulosis without findings of acute diverticulitis. There is short segment wall thickening involving the sigmoid colon which is at least in part accentuated by under distension, for instance on sagittal image 85-80 3/10. Mild symmetric rectal wall thickening, which may be related to radiation changes depending on the therapy. Vascular/Lymphatic: Aortic atherosclerosis with infrarenal abdominal aortic aneurysm measuring 3.4 cm. Reproductive: Interval progression of the abdominopelvic adenopathy including new retroperitoneal adenopathy to the mid renal level and further enlargement of the right iliac chain adenopathy. Index lesions are as follows: -Aortocaval lymph node at the level of the mid kidneys measures 1.5 cm in short axis on image 127/6. -Right pelvic sidewall lymph node measures 1.6 cm on image 190/6. Other: There is retroperitoneal, mesenteric and pelvic soft tissue stranding. Prominent mesenteric lymph nodes without adenopathy by size criteria. No definite peritoneal or omental nodularity. Fat containing ventral hernia. Musculoskeletal: No aggressive lytic or blastic lesion of bone. IMPRESSION: 1. Postsurgical change of cystoprostatectomy with ileal conduit formation. No hydronephrosis. No definite evidence of upper tract disease. However, the distal right ureter is not well opacified limiting evaluation. 2. Interval progression  of the abdominopelvic adenopathy, consistent with worsening nodal metastatic disease. 3. Mesenteric and pelvic soft tissue stranding with prominent mesenteric lymph nodes not enlarged by size criteria and without discrete peritoneal/omental soft tissue nodularity, findings which are nonspecific and possibly postsurgical. Attention on follow-up imaging is recommended. 4. Extensive left-sided colonic diverticulosis with short segment wall thickening of the sigmoid colon which is at least in part accentuated by under  distension. While this may be related to Segmental colitis associated with diverticulosis or prior oncology therapy, correlation with colonoscopy is recommended if not recently performed to exclude underlying colonic neoplasm. 5. Mild symmetric rectal wall thickening, which may be related to radiation changes depending on the therapy. 6. Infrarenal abdominal aortic aneurysm measuring 3.4 cm. Follow-up ultrasound every 3 years is the recommendation. However, in this oncology patient attention on follow-up imaging is sufficient. This recommendation follows ACR consensus guidelines: White Paper of the ACR Incidental Findings Committee II on Vascular Findings. J Am Coll Radiol 2013; 10:789-794. 7. Cholelithiasis without evidence of acute cholecystitis. 8. Emphysema and aortic atherosclerosis. Aortic Atherosclerosis (ICD10-I70.0) and Emphysema (ICD10-J43.9). These results will be called to the ordering clinician or representative by the Radiologist Assistant, and communication documented in the PACS or [employer]. Electronically Signed   By: Dahlia Bailiff MD   On: 04/20/2021 08:51   CT CHEST W CONTRAST  Result Date: 04/20/2021 CLINICAL DATA:  History of bladder cancer status post cysto prostatectomy with pelvic lymph node dissection and ileal conduit formation. EXAM: CT CHEST WITH CONTRAST CT ABDOMEN AND PELVIS WITH AND WITHOUT CONTRAST TECHNIQUE: Multidetector CT imaging of the chest was performed during intravenous contrast administration. Multidetector CT imaging of the abdomen and pelvis was performed following the standard protocol before and during bolus administration of intravenous contrast. CONTRAST:  167mL OMNIPAQUE IOHEXOL 300 MG/ML  SOLN COMPARISON:  CT November 18, 2020 FINDINGS: CT CHEST FINDINGS Cardiovascular: No thoracic aortic aneurysm. No central pulmonary embolus. LAD and left circumflex coronary artery calcifications. Normal size heart. Trace pericardial effusion, likely physiologic.  Mediastinum/Nodes: No discrete thyroid nodularity. No supraclavicular adenopathy. No axillary adenopathy. No mediastinal or hilar adenopathy. The trachea esophagus are grossly unremarkable. Lungs/Pleura: Mild emphysematous change. No airspace consolidation. No suspicious pulmonary nodules or masses. No pleural effusion. No pneumothorax. Musculoskeletal: No chest wall mass. CT ABDOMEN AND PELVIS FINDINGS Hepatobiliary: No suspicious hepatic lesion. Cholelithiasis measuring 8 mm without evidence of acute cholecystitis. No biliary ductal dilation. Pancreas: Within normal limits. Spleen: Within normal limits. Adrenals/Urinary Tract: Bilateral adrenal glands are unremarkable. Postsurgical change of cystoprostatectomy with ileal conduit formation in the right anterior abdominal wall. No hydronephrosis. Symmetric enhancement excretion of contrast in the bilateral kidneys. No suspicious filling defect visualized within the opacified portions of the collecting system or ureters on delayed imaging. However, the distal right ureter is not opacified on delayed imaging limiting evaluation. Bilateral renal cysts measuring 11 mm on the left and 13 mm on the right. Stomach/Bowel: The stomach is grossly unremarkable for degree of distension. No pathologic dilation of small bowel. Normal appendix. Surgical changes of ileal conduit formation. Colonic diverticulosis without findings of acute diverticulitis. There is short segment wall thickening involving the sigmoid colon which is at least in part accentuated by under distension, for instance on sagittal image 85-80 3/10. Mild symmetric rectal wall thickening, which may be related to radiation changes depending on the therapy. Vascular/Lymphatic: Aortic atherosclerosis with infrarenal abdominal aortic aneurysm measuring 3.4 cm. Reproductive: Interval progression of the abdominopelvic adenopathy including new retroperitoneal adenopathy to the mid  renal level and further enlargement of  the right iliac chain adenopathy. Index lesions are as follows: -Aortocaval lymph node at the level of the mid kidneys measures 1.5 cm in short axis on image 127/6. -Right pelvic sidewall lymph node measures 1.6 cm on image 190/6. Other: There is retroperitoneal, mesenteric and pelvic soft tissue stranding. Prominent mesenteric lymph nodes without adenopathy by size criteria. No definite peritoneal or omental nodularity. Fat containing ventral hernia. Musculoskeletal: No aggressive lytic or blastic lesion of bone. IMPRESSION: 1. Postsurgical change of cystoprostatectomy with ileal conduit formation. No hydronephrosis. No definite evidence of upper tract disease. However, the distal right ureter is not well opacified limiting evaluation. 2. Interval progression of the abdominopelvic adenopathy, consistent with worsening nodal metastatic disease. 3. Mesenteric and pelvic soft tissue stranding with prominent mesenteric lymph nodes not enlarged by size criteria and without discrete peritoneal/omental soft tissue nodularity, findings which are nonspecific and possibly postsurgical. Attention on follow-up imaging is recommended. 4. Extensive left-sided colonic diverticulosis with short segment wall thickening of the sigmoid colon which is at least in part accentuated by under distension. While this may be related to Segmental colitis associated with diverticulosis or prior oncology therapy, correlation with colonoscopy is recommended if not recently performed to exclude underlying colonic neoplasm. 5. Mild symmetric rectal wall thickening, which may be related to radiation changes depending on the therapy. 6. Infrarenal abdominal aortic aneurysm measuring 3.4 cm. Follow-up ultrasound every 3 years is the recommendation. However, in this oncology patient attention on follow-up imaging is sufficient. This recommendation follows ACR consensus guidelines: White Paper of the ACR Incidental Findings Committee II on Vascular  Findings. J Am Coll Radiol 2013; 10:789-794. 7. Cholelithiasis without evidence of acute cholecystitis. 8. Emphysema and aortic atherosclerosis. Aortic Atherosclerosis (ICD10-I70.0) and Emphysema (ICD10-J43.9). These results will be called to the ordering clinician or representative by the Radiologist Assistant, and communication documented in the PACS or [employer]. Electronically Signed   By: Dahlia Bailiff MD   On: 04/20/2021 08:51    Assessment and Plan:   60 year old man with:  1.  Bladder cancer documented in 2020 with nonmuscle invasive disease and subsequently developed muscle invasive high-grade urothelial carcinoma in 2022.  He underwent radical cystoprostatectomy and lymphadenectomy and found to have T4N2 disease.  He subsequently developed worsening metastatic disease on CT scan April 19, 2021 with predominantly pelvic and abdominal adenopathy.  's disease status was updated at this time and treatment options were discussed.  He definitely would benefit from systemic therapy given his stage IV disease at this time.  These options including systemic chemotherapy using a platinum based regimen versus immunotherapy which is considered or second line therapy at this time.   The logistics and rationale for using chemotherapy was reviewed today in detail.  Complication associated with cisplatin and gemcitabine chemotherapy was discussed.  These complications include nausea, vomiting, myelosuppression, fatigue, infusion related complications, renal insufficiency, neutropenia, neutropenic sepsis and rarely serious thrombosis, hospitalization and death.  The benefit would also if he has an excellent response to chemotherapy, curative surgical resection may be attempted.  The plan is to treat with gemcitabine and cisplatin on day 1, gemcitabine day 8 out of a 21-day cycle.  Anticipate needing 6 cycles of therapy    After discussion today, he is agreeable to proceed after chemo education class.      2.  IV access: Risks and benefits of using Port-A-Cath versus peripheral veins was discussed today.  Complication associated with Port-A-Cath insertion include bleeding, infection  and thrombosis.  After discussing the risks and benefits, he is agreeable to proceed.   3.  Antiemetics: Prescription for Compazine was made available to him.   4.  Renal function surveillance: We will continue to monitor on cisplatin therapy.   5.  Goals of care: Therapy is palliative although aggressive measures are warranted at this time.   6.  Follow-up:will be in the immediate future to start chemotherapy.  60  minutes were dedicated to this visit. The time was spent on reviewing laboratory data, imaging studies, discussing treatment options, complications related to therapy and answering questions regarding future plan.      A copy of this consult has been forwarded to the requesting physician.

## 2021-05-18 NOTE — Progress Notes (Signed)
START ON PATHWAY REGIMEN - Bladder     A cycle is every 21 days:     Gemcitabine      Cisplatin   **Always confirm dose/schedule in your pharmacy ordering system**  Patient Characteristics: Advanced/Metastatic Disease, First Line, No Prior Platinum-Based Therapy, Good Renal Function (CrCl ? 50 mL/min) Therapeutic Status: Advanced/Metastatic Disease Line of Therapy: First Line Prior Platinum-Based Therapy<= No Renal Function: Good Renal Function (CrCl ? 50 mL/min) Intent of Therapy: Non-Curative / Palliative Intent, Discussed with Patient 

## 2021-05-22 ENCOUNTER — Ambulatory Visit: Payer: BC Managed Care – PPO | Admitting: Oncology

## 2021-05-24 ENCOUNTER — Inpatient Hospital Stay: Payer: BC Managed Care – PPO

## 2021-05-25 NOTE — Progress Notes (Signed)
Pharmacist Chemotherapy Monitoring - Initial Assessment    Anticipated start date: 06/01/21   The following has been reviewed per standard work regarding the patient's treatment regimen: The patient's diagnosis, treatment plan and drug doses, and organ/hematologic function Lab orders and baseline tests specific to treatment regimen  The treatment plan start date, drug sequencing, and pre-medications Prior authorization status  Patient's documented medication list, including drug-drug interaction screen and prescriptions for anti-emetics and supportive care specific to the treatment regimen The drug concentrations, fluid compatibility, administration routes, and timing of the medications to be used The patient's access for treatment and lifetime cumulative dose history, if applicable  The patient's medication allergies and previous infusion related reactions, if applicable   Changes made to treatment plan:  mag lab ordered to monitor mag with cisplatin  Follow up needed:  Pending authorization for treatment    Philomena Course, [place], 05/25/2021  10:05 AM

## 2021-05-28 ENCOUNTER — Inpatient Hospital Stay: Payer: BC Managed Care – PPO

## 2021-05-28 ENCOUNTER — Telehealth: Payer: Self-pay | Admitting: *Deleted

## 2021-05-28 ENCOUNTER — Telehealth: Payer: Self-pay | Admitting: Oncology

## 2021-05-28 NOTE — Telephone Encounter (Signed)
PC to patient's wife, Barry Gibson, informed her infusion appointment will be moved out 10 days from positive covid test.  This is per the [place] covid protocol.  Infusion appointment will be 06/04/21 or later, his wife states he needs infusion appointments on Fridays if at all possible.  Wife instructed to call IR about PAC placement.  Education RN has been contacted about today's appointment & the fact patient is covid positive.  Scheduling message sent.  Wife verbalizes understanding.

## 2021-05-28 NOTE — Telephone Encounter (Signed)
Scheduled appointment per 07/11 sch msg. Patient is aware. 

## 2021-05-28 NOTE — Telephone Encounter (Signed)
-----   Message from Wyatt Portela, MD sent at 05/28/2021 10:09 AM EDT ----- Regarding: RE: Covid + His appointments and chemo can be pushed according to the cancer center protocol.  Thanks ----- Message ----- From: Rolene Course, RN Sent: 05/28/2021   9:48 AM EDT To: Wyatt Portela, MD Subject: Covid +                                        This patient's wife called - [place] test on July 8.  He has 3 appointments this week, chemo education, PAC placement, & first chemo on Friday.  Threasa Beards said he should be 10 days out before he comes to the cancer center unless you feel it is imperative that he have tx as scheduled.  Please advise,  Bethena Roys

## 2021-05-30 ENCOUNTER — Other Ambulatory Visit (HOSPITAL_COMMUNITY): Payer: BC Managed Care – PPO

## 2021-05-30 ENCOUNTER — Telehealth: Payer: Self-pay | Admitting: Oncology

## 2021-05-30 ENCOUNTER — Ambulatory Visit (HOSPITAL_COMMUNITY): Payer: BC Managed Care – PPO

## 2021-05-30 NOTE — Telephone Encounter (Signed)
Scheduled per 07/01 los, patient has been called and notified of upcoming appointments.

## 2021-06-01 ENCOUNTER — Ambulatory Visit: Payer: BC Managed Care – PPO

## 2021-06-01 ENCOUNTER — Telehealth: Payer: Self-pay | Admitting: Oncology

## 2021-06-01 ENCOUNTER — Other Ambulatory Visit: Payer: BC Managed Care – PPO

## 2021-06-01 NOTE — Telephone Encounter (Signed)
Scheduled per 07/15 scheduled message, patient has been called and notified of all upcoming appointments.

## 2021-06-06 ENCOUNTER — Other Ambulatory Visit: Payer: Self-pay | Admitting: Student

## 2021-06-07 ENCOUNTER — Ambulatory Visit (HOSPITAL_COMMUNITY)
Admission: RE | Admit: 2021-06-07 | Discharge: 2021-06-07 | Disposition: A | Discharge status: Home / Self Care | Payer: BC Managed Care – PPO | Source: Ambulatory Visit | Attending: Oncology | Admitting: Oncology

## 2021-06-07 ENCOUNTER — Other Ambulatory Visit: Payer: Self-pay | Admitting: Oncology

## 2021-06-07 ENCOUNTER — Encounter: Payer: Self-pay | Admitting: Oncology

## 2021-06-07 ENCOUNTER — Encounter (HOSPITAL_COMMUNITY): Payer: Self-pay

## 2021-06-07 ENCOUNTER — Other Ambulatory Visit: Payer: Self-pay

## 2021-06-07 DIAGNOSIS — Z79899 Other long term (current) drug therapy: Secondary | ICD-10-CM | POA: Insufficient documentation

## 2021-06-07 DIAGNOSIS — Z87891 Personal history of nicotine dependence: Secondary | ICD-10-CM | POA: Insufficient documentation

## 2021-06-07 DIAGNOSIS — I10 Essential (primary) hypertension: Secondary | ICD-10-CM | POA: Diagnosis not present

## 2021-06-07 DIAGNOSIS — C679 Malignant neoplasm of bladder, unspecified: Secondary | ICD-10-CM | POA: Diagnosis present

## 2021-06-07 DIAGNOSIS — M069 Rheumatoid arthritis, unspecified: Secondary | ICD-10-CM | POA: Diagnosis not present

## 2021-06-07 DIAGNOSIS — K219 Gastro-esophageal reflux disease without esophagitis: Secondary | ICD-10-CM | POA: Insufficient documentation

## 2021-06-07 DIAGNOSIS — C670 Malignant neoplasm of trigone of bladder: Secondary | ICD-10-CM

## 2021-06-07 HISTORY — PX: IR IMAGING GUIDED PORT INSERTION: IMG5740

## 2021-06-07 MED ORDER — SODIUM CHLORIDE 0.9 % IV SOLN: INTRAVENOUS | Status: DC

## 2021-06-07 MED ORDER — MIDAZOLAM HCL 2 MG/2ML IJ SOLN
INTRAMUSCULAR | Status: AC
  Filled 2021-06-07: qty 4

## 2021-06-07 MED ORDER — HEPARIN SOD (PORK) LOCK FLUSH 100 UNIT/ML IV SOLN
INTRAVENOUS | Status: AC
  Filled 2021-06-07: qty 5

## 2021-06-07 MED ORDER — LIDOCAINE-EPINEPHRINE 1 %-1:100000 IJ SOLN
INTRAMUSCULAR | Status: AC | PRN
  Administered 2021-06-07 (×2): 10 mL via INTRADERMAL

## 2021-06-07 MED ORDER — FENTANYL CITRATE (PF) 100 MCG/2ML IJ SOLN
INTRAMUSCULAR | Status: AC
  Filled 2021-06-07: qty 2

## 2021-06-07 MED ORDER — FENTANYL CITRATE (PF) 100 MCG/2ML IJ SOLN
INTRAMUSCULAR | Status: AC | PRN
  Administered 2021-06-07 (×2): 50 ug via INTRAVENOUS

## 2021-06-07 MED ORDER — LIDOCAINE-EPINEPHRINE 1 %-1:100000 IJ SOLN
INTRAMUSCULAR | Status: AC
  Filled 2021-06-07: qty 1

## 2021-06-07 MED ORDER — HEPARIN SOD (PORK) LOCK FLUSH 100 UNIT/ML IV SOLN
INTRAVENOUS | Status: AC | PRN
  Administered 2021-06-07: 500 [IU] via INTRAVENOUS

## 2021-06-07 MED ORDER — MIDAZOLAM HCL 2 MG/2ML IJ SOLN
INTRAMUSCULAR | Status: AC | PRN
  Administered 2021-06-07: 2 mg via INTRAVENOUS

## 2021-06-07 NOTE — Progress Notes (Signed)
Called pt to introduce myself as his Arboriculturist.  Unfortunately there aren't any foundations offering copay assistance for his Dx and the type of ins he has.  Pt was unavailable so I spoke to his wife and informed her of the [employer], went over what it covers and gave her the income requirement.  She will look at their income and if he qualifies they will bring proof of income on 06/08/21.  If approved I will give him an expense sheet and my card for any questions or concerns he may have in the future.

## 2021-06-07 NOTE — Procedures (Signed)
Pre Procedure Dx: Poor venous access Post Procedural Dx: Same  Successful placement of right IJ approach port-a-cath with tip at the superior caval atrial junction. The catheter is ready for immediate use.  Estimated Blood Loss: Minimal  Complications: None immediate.  Jay Arvel Oquinn, MD Pager #: [phone]   

## 2021-06-07 NOTE — H&P (Signed)
Chief Complaint: Patient was seen in consultation today for bladder cancer  Referring Physician(s): Wyatt Portela  Supervising Physician: Sandi Mariscal  Patient Status: [facility] - Out-pt  History of Present Illness: Barry Gibson is a 60 y.o. male with history of rheumatoid arthritis, HTN, GERD, and recurrent bladder cancer. 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  He is known to IR for bilateral nephrostomy tube placements and regular exchanges until his complete cystectomy, radical prostatectomy, and ileal conduit creation 11/29/20.  Subsequent imaging showed further evidence of meatstatic disease and he now has plans to initiate chemotherapy.  He is referred to IR for Port-A-Cath placement.   Barry Gibson presents today in his usual state of health.  He has been NPO.  He does not take blood thinners.  Denies fever, chills, cough, shortness of breath, abdominal pain, nausea, vomiting.   Past Medical History:  Diagnosis Date   Cancer [facility])    bladder   Cataract    COVID-19 11/13/2019   h/a, chills, fatigue, loss of taste and smell, all symptoms resolved in a few weeks   Frequent urination    Fuchs' corneal dystrophy    Full dentures    GERD (gastroesophageal reflux disease)    Hypertension    Rheumatoid arthritis ([place])     Past Surgical History:  Procedure Laterality Date   CORNEAL TRANSPLANT Left 2013   CYSTOSCOPY WITH BIOPSY N/A 09/14/2019   Procedure: CYSTOSCOPY WITH BIOPSY;  Surgeon: Franchot Gallo, MD;  Location: [facility];  Service: Urology;  Laterality: N/A;   CYSTOSCOPY WITH INJECTION N/A 11/29/2020   Procedure: CYSTOSCOPY WITH INJECTION OF INDOCYANINE GREEN DYE;  Surgeon: Alexis Frock, MD;  Location: [facility];  Service: Urology;  Laterality: N/A;  6 HRS   FULGURATION OF BLADDER TUMOR N/A 09/14/2019   Procedure: FULGURATION OF BLADDER TUMOR;  Surgeon: Franchot Gallo, MD;  Location: [facility];  Service: Urology;   Laterality: N/A;   IR NEPHROSTOMY EXCHANGE LEFT  10/30/2020   IR NEPHROSTOMY EXCHANGE LEFT  11/19/2020   IR NEPHROSTOMY EXCHANGE RIGHT  10/30/2020   IR NEPHROSTOMY EXCHANGE RIGHT  11/19/2020   IR NEPHROSTOMY PLACEMENT LEFT  09/27/2020   IR NEPHROSTOMY PLACEMENT RIGHT  09/27/2020   ROBOT ASSISTED LAPAROSCOPIC COMPLETE CYSTECT ILEAL CONDUIT N/A 11/29/2020   Procedure: XI ROBOTIC ASSISTED LAPAROSCOPIC COMPLETE CYSTECT ILEAL CONDUIT/RADICAL PROSTATECTOMY/LYMPHADENECTOMY;  Surgeon: Alexis Frock, MD;  Location: [facility];  Service: Urology;  Laterality: N/A;   TRANSURETHRAL RESECTION OF BLADDER TUMOR WITH MITOMYCIN-C N/A 08/05/2019   Procedure: TRANSURETHRAL RESECTION OF BLADDER TUMOR;  Surgeon: Franchot Gallo, MD;  Location: [facility];  Service: Urology;  Laterality: N/A;  1 HR   TRANSURETHRAL RESECTION OF BLADDER TUMOR WITH MITOMYCIN-C N/A 04/24/2020   Procedure: TRANSURETHRAL RESECTION OF BLADDER TUMOR WITH GEMCIDABINE;  Surgeon: Franchot Gallo, MD;  Location: [facility];  Service: Urology;  Laterality: N/A;    Allergies: Ace inhibitors and Diflucan [fluconazole]  Medications: Prior to Admission medications   Medication Sig Start Date End Date Taking? Authorizing Provider  acetaminophen (TYLENOL) 650 MG CR tablet Take 1,300 mg by mouth every morning.   Yes [provider]  amLODipine (NORVASC) 10 MG tablet Take 1 tablet (10 mg total) by mouth daily. 05/07/21  Yes Lovena Le, Malena M, DO  leflunomide (ARAVA) 20 MG tablet Take 20 mg by mouth daily. 07/27/19  Yes [provider]  omeprazole (PRILOSEC) 40 MG capsule Take 40 mg by mouth daily.   Yes  [provider]  pravastatin (PRAVACHOL) 40 MG tablet TAKE 1 TABLET(40 MG) BY MOUTH DAILY 05/07/21  Yes Lovena Le, Malena M, DO  prednisoLONE acetate (PRED FORTE) 1 % ophthalmic suspension Place 1 drop into the left eye 2 (two) times a week. Instill 1 drop into left eye Monday and Friday.   Yes  [provider]  sertraline (ZOLOFT) 50 MG tablet Take 1 tablet (50 mg total) by mouth daily. 05/07/21  Yes Lovena Le, Malena M, DO  lidocaine-prilocaine (EMLA) cream Apply 1 application topically as needed. 05/18/21   Wyatt Portela, MD  prochlorperazine (COMPAZINE) 10 MG tablet Take 1 tablet (10 mg total) by mouth every 6 (six) hours as needed for nausea or vomiting. 05/18/21   Wyatt Portela, MD     History reviewed. No pertinent family history.  Social History   Socioeconomic History   Marital status: Married    Spouse name: Not on file   Number of children: Not on file   Years of education: Not on file   Highest education level: Not on file  Occupational History   Not on file  Tobacco Use   Smoking status: Former    Packs/day: 0.50    Years: 20.00    Pack years: 10.00    Types: Cigarettes   Smokeless tobacco: Never   Tobacco comments:    quit 2010  Vaping Use   Vaping Use: Never used  Substance and Sexual Activity   Alcohol use: Yes    Comment: occ  2 x month   Drug use: No   Sexual activity: Yes  Other Topics Concern   Not on file  Social History Narrative   Not on file   Social Determinants of Health   Financial Resource Strain: Not on file  Food Insecurity: Not on file  Transportation Needs: Not on file  Physical Activity: Not on file  Stress: Not on file  Social Connections: Not on file     Review of Systems: A 12 point ROS discussed and pertinent positives are indicated in the HPI above.  All other systems are negative.  Review of Systems  Constitutional:  Negative for fatigue and fever.  Respiratory:  Negative for cough and shortness of breath.   Cardiovascular:  Negative for chest pain.  Gastrointestinal:  Negative for abdominal pain, nausea and vomiting.  Genitourinary:  Negative for dysuria (ileal conduit).  Musculoskeletal:  Negative for back pain.  Psychiatric/Behavioral:  Negative for behavioral problems and confusion.    Vital  Signs: BP 138/87   Pulse 67   Temp 98.6 F (37 C) (Oral)   Resp 18   SpO2 99%   Physical Exam Vitals and nursing note reviewed.  Constitutional:      General: He is not in acute distress.    Appearance: Normal appearance. He is not ill-appearing.  HENT:     Mouth/Throat:     Mouth: Mucous membranes are moist.     Pharynx: Oropharynx is clear.  Cardiovascular:     Rate and Rhythm: Normal rate and regular rhythm.  Pulmonary:     Effort: Pulmonary effort is normal. No respiratory distress.     Breath sounds: Normal breath sounds.  Skin:    General: Skin is warm and dry.  Neurological:     General: No focal deficit present.     Mental Status: He is alert and oriented to person, place, and time. Mental status is at baseline.  Psychiatric:        Mood  and Affect: Mood normal.        Behavior: Behavior normal.        Thought Content: Thought content normal.        Judgment: Judgment normal.     MD Evaluation Airway: WNL Heart: WNL Abdomen: WNL Chest/ Lungs: WNL ASA  Classification: 3 Mallampati/Airway Score: Two   Imaging: No results found.  Labs:  CBC: Recent Labs    09/27/20 1416 09/29/20 1142 11/18/20 1820 11/28/20 1642 11/29/20 1526 12/01/20 0558 12/02/20 0644 12/03/20 0526 12/04/20 0513  WBC 6.9 7.1 10.9* 8.1  --   --   --   --   --   HGB 12.7* 12.7* 11.8* 12.5*   < > 9.4* 10.6* 10.8* 9.7*  HCT 38.5* 39.0 36.5* 38.2*   < > 29.7* 32.6* 32.9* 29.9*  PLT 222 233 293 421*  --   --   --   --   --    < > = values in this interval not displayed.    COAGS: Recent Labs    09/24/20 0747 11/19/20 0820  INR 1.0 1.1  APTT 28  --     BMP: Recent Labs    09/29/20 1144 10/06/20 1011 11/18/20 1820 12/02/20 0644 12/03/20 0526 12/04/20 0513 04/10/21 1347  NA 145* 136   < > 136 136 132* 141  K 3.9 3.8   < > 3.1* 3.1* 2.7* 4.1  CL 107* 100   < > 103 100 100 107  CO2 22 20   < > 24 25 22 27   GLUCOSE 125* 135*   < > 105* 103* 462* 100*  BUN 28* 20    < > 12 14 15 18   CALCIUM 8.9 8.8   < > 8.7* 8.8* 7.9* 9.1  CREATININE 3.10* 2.03*   < > 1.16 1.24 1.22 1.16  GFRNONAA 21* 35*   < > >60 >60 >60 >60  GFRAA 24* 41*  --   --   --   --   --    < > = values in this interval not displayed.    LIVER FUNCTION TESTS: Recent Labs    09/24/20 0747 11/18/20 1820 11/28/20 1642 04/10/21 1347  BILITOT 0.8 0.6 0.6 0.7  AST 97* 15 14* 15  ALT 35 12 12 14   ALKPHOS 44 49 49 76  PROT 6.5 7.5 7.8 7.7  ALBUMIN 3.5 3.6 3.7 4.0    TUMOR MARKERS: No results for input(s): AFPTM, CEA, CA199, CHROMGRNA in the last 8760 hours.  Assessment and Plan: Patient with past medical history of bladder cancer recurrence presents in need of Port-A-Cath placement for chemotherapy at the request of Dr. Alen Blew. Case reviewed by Dr. Pascal Lux who approves patient for procedure.  Patient presents today in their usual state of health.  He has been NPO and is not currently on blood thinners.   Risks and benefits of image guided port-a-catheter placement was discussed with the patient including, but not limited to bleeding, infection, pneumothorax, or fibrin sheath development and need for additional procedures.  All of the patient's questions were answered, patient is agreeable to proceed. Consent signed and in chart.  Thank you for this interesting consult.  I greatly enjoyed meeting ARBIE BLANKLEY and look forward to participating in their care.  A copy of this report was sent to the requesting provider on this date.  Electronically Signed: Docia Barrier, PA 06/07/2021, 2:54 PM   I spent a total of  30 Minutes   in  face to face in clinical consultation, greater than 50% of which was counseling/coordinating care for bladder cancer.

## 2021-06-07 NOTE — Discharge Instructions (Addendum)
Interventional radiology phone numbers [phone] After hours [phone]  Wound - May remove dressing and shower in 24 to 48 hours.  Keep site clean and dry.  Replace with bandaid as needed.  Do not submerge in tub or water until site healing well. If closed with glue, glue will flake off on its own.  After completion of treatment, your provider should have you set up for monthly port flushes.    You have skin glue (dermabond) over your new port. Do not use the lidocaine cream (EMLA cream) over the skin glue until it has healed. The petroleum in the lidocaine cream will dissolve the skin glue resulting in an infection of your new port. Use ice in a zip lock bag for 1-2 minutes over your new port before the cancer center nurses access your port.   Implanted Port Insertion, Care After This sheet gives you information about how to care for yourself after your procedure. Your health care provider may also give you more specific instructions. If you have problems or questions, contact your health care provider. What can I expect after the procedure? After the procedure, it is common to have: Discomfort at the port insertion site. Bruising on the skin over the port. This should improve over 3-4 days. Follow these instructions at home: [facility] care After your port is placed, you will get a manufacturer's information card. The card has information about your port. Keep this card with you at all times. Take care of the port as told by your health care provider. Ask your health care provider if you or a family member can get training for taking care of the port at home. A home health care nurse may also take care of the port. Make sure to remember what type of port you have. Incision care Follow instructions from your health care provider about how to take care of your port insertion site. Make sure you: Wash your hands with soap and water before and after you change your bandage (dressing). If soap  and water are not available, use hand sanitizer. Change your dressing as told by your health care provider. Leave skin glue in place. These skin closures may need to stay in place for 2 weeks or longer.  Check your port insertion site every day for signs of infection. Check for: Redness, swelling, or pain. Fluid or blood. Warmth. Pus or a bad smell.      Activity Return to your normal activities as told by your health care provider. Ask your health care provider what activities are safe for you. Do not lift anything that is heavier than 10 lb (4.5 kg), or the limit that you are told, until your health care provider says that it is safe. General instructions Take over-the-counter and prescription medicines only as told by your health care provider. Do not take baths, swim, or use a hot tub until your health care provider approves.You may remove your dressing tomorrow and shower 24 hours after your procedure. Do not drive for 24 hours if you were given a sedative during your procedure. Wear a medical alert bracelet in case of an emergency. This will tell any health care providers that you have a port. Keep all follow-up visits as told by your health care provider. This is important. Contact a health care provider if: You cannot flush your port with saline as directed, or you cannot draw blood from the port. You have a fever or chills. You have redness, swelling, or pain around  your port insertion site. You have fluid or blood coming from your port insertion site. Your port insertion site feels warm to the touch. You have pus or a bad smell coming from the port insertion site. Get help right away if: You have chest pain or shortness of breath. You have bleeding from your port that you cannot control. Summary Take care of the port as told by your health care provider. Keep the manufacturer's information card with you at all times. Change your dressing as told by your health care  provider. Contact a health care provider if you have a fever or chills or if you have redness, swelling, or pain around your port insertion site. Keep all follow-up visits as told by your health care provider. This information is not intended to replace advice given to you by your health care provider. Make sure you discuss any questions you have with your health care provider. Document Revised: 06/02/2018 Document Reviewed: 06/02/2018 Elsevier Patient Education  2021 [place].    Moderate Conscious Sedation, Adult, Care After This sheet gives you information about how to care for yourself after your procedure. Your health care provider may also give you more specific instructions. If you have problems or questions, contact your health care provider. What can I expect after the procedure? After the procedure, it is common to have: Sleepiness for several hours. Impaired judgment for several hours. Difficulty with balance. Vomiting if you eat too soon. Follow these instructions at home: For the time period you were told by your health care provider: Rest. Do not participate in activities where you could fall or become injured. Do not drive or use machinery. Do not drink alcohol. Do not take sleeping pills or medicines that cause drowsiness. Do not make important decisions or sign legal documents. Do not take care of children on your own.      Eating and drinking Follow the diet recommended by your health care provider. Drink enough fluid to keep your urine pale yellow. If you vomit: Drink water, juice, or soup when you can drink without vomiting. Make sure you have little or no nausea before eating solid foods.   General instructions Take over-the-counter and prescription medicines only as told by your health care provider. Have a responsible adult stay with you for the time you are told. It is important to have someone help care for you until you are awake and alert. Do not  smoke. Keep all follow-up visits as told by your health care provider. This is important. Contact a health care provider if: You are still sleepy or having trouble with balance after 24 hours. You feel light-headed. You keep feeling nauseous or you keep vomiting. You develop a rash. You have a fever. You have redness or swelling around the IV site. Get help right away if: You have trouble breathing. You have new-onset confusion at home. Summary After the procedure, it is common to feel sleepy, have impaired judgment, or feel nauseous if you eat too soon. Rest after you get home. Know the things you should not do after the procedure. Follow the diet recommended by your health care provider and drink enough fluid to keep your urine pale yellow. Get help right away if you have trouble breathing or new-onset confusion at home. This information is not intended to replace advice given to you by your health care provider. Make sure you discuss any questions you have with your health care provider. Document Revised: 03/03/2020 Document Reviewed: 09/30/2019 Elsevier  Patient Education  2021 [employer].

## 2021-06-08 ENCOUNTER — Other Ambulatory Visit: Payer: BC Managed Care – PPO

## 2021-06-08 ENCOUNTER — Ambulatory Visit: Payer: BC Managed Care – PPO

## 2021-06-08 ENCOUNTER — Ambulatory Visit: Payer: BC Managed Care – PPO | Admitting: Oncology

## 2021-06-08 ENCOUNTER — Inpatient Hospital Stay: Payer: BC Managed Care – PPO

## 2021-06-08 VITALS — BP 140/93 | HR 67 | Temp 97.8°F | Resp 18

## 2021-06-08 DIAGNOSIS — C679 Malignant neoplasm of bladder, unspecified: Secondary | ICD-10-CM

## 2021-06-08 DIAGNOSIS — Z5111 Encounter for antineoplastic chemotherapy: Secondary | ICD-10-CM | POA: Diagnosis not present

## 2021-06-08 DIAGNOSIS — C670 Malignant neoplasm of trigone of bladder: Secondary | ICD-10-CM

## 2021-06-08 LAB — CBC WITH DIFFERENTIAL (CANCER CENTER ONLY)
Abs Immature Granulocytes: 0.03 10*3/uL (ref 0.00–0.07)
Basophils Absolute: 0.1 10*3/uL (ref 0.0–0.1)
Basophils Relative: 1 %
Eosinophils Absolute: 0.3 10*3/uL (ref 0.0–0.5)
Eosinophils Relative: 3 %
HCT: 42 % (ref 39.0–52.0)
Hemoglobin: 13.6 g/dL (ref 13.0–17.0)
Immature Granulocytes: 0 %
Lymphocytes Relative: 19 %
Lymphs Abs: 1.7 10*3/uL (ref 0.7–4.0)
MCH: 29.2 pg (ref 26.0–34.0)
MCHC: 32.4 g/dL (ref 30.0–36.0)
MCV: 90.3 fL (ref 80.0–100.0)
Monocytes Absolute: 0.6 10*3/uL (ref 0.1–1.0)
Monocytes Relative: 7 %
Neutro Abs: 6 10*3/uL (ref 1.7–7.7)
Neutrophils Relative %: 70 %
Platelet Count: 260 10*3/uL (ref 150–400)
RBC: 4.65 MIL/uL (ref 4.22–5.81)
RDW: 14.1 % (ref 11.5–15.5)
WBC Count: 8.8 10*3/uL (ref 4.0–10.5)
nRBC: 0 % (ref 0.0–0.2)

## 2021-06-08 LAB — CMP (CANCER CENTER ONLY)
ALT: 12 U/L (ref 0–44)
AST: 16 U/L (ref 15–41)
Albumin: 3.6 g/dL (ref 3.5–5.0)
Alkaline Phosphatase: 68 U/L (ref 38–126)
Anion gap: 9 (ref 5–15)
BUN: 18 mg/dL (ref 6–20)
CO2: 26 mmol/L (ref 22–32)
Calcium: 9.1 mg/dL (ref 8.9–10.3)
Chloride: 106 mmol/L (ref 98–111)
Creatinine: 1.11 mg/dL (ref 0.61–1.24)
GFR, Estimated: >60 mL/min (ref >60)
Glucose, Bld: 110 mg/dL — ABNORMAL HIGH (ref 70–99)
Potassium: 3.7 mmol/L (ref 3.5–5.1)
Sodium: 141 mmol/L (ref 135–145)
Total Bilirubin: 0.4 mg/dL (ref 0.3–1.2)
Total Protein: 7 g/dL (ref 6.5–8.1)

## 2021-06-08 LAB — MAGNESIUM: Magnesium: 1.8 mg/dL (ref 1.7–2.4)

## 2021-06-08 MED ORDER — SODIUM CHLORIDE 0.9 % IV SOLN
1000.0000 mg/m2 | Freq: Once | INTRAVENOUS | Status: AC
  Administered 2021-06-08: 2204 mg via INTRAVENOUS
  Filled 2021-06-08: qty 57.97

## 2021-06-08 MED ORDER — SODIUM CHLORIDE 0.9 % IV SOLN
10.0000 mg | Freq: Once | INTRAVENOUS | Status: AC
  Administered 2021-06-08: 10 mg via INTRAVENOUS
  Filled 2021-06-08: qty 10

## 2021-06-08 MED ORDER — PALONOSETRON HCL INJECTION 0.25 MG/5ML
INTRAVENOUS | Status: AC
  Filled 2021-06-08: qty 5

## 2021-06-08 MED ORDER — SODIUM CHLORIDE 0.9 % IV SOLN
150.0000 mg | Freq: Once | INTRAVENOUS | Status: AC
  Administered 2021-06-08: 150 mg via INTRAVENOUS
  Filled 2021-06-08: qty 150

## 2021-06-08 MED ORDER — MAGNESIUM SULFATE 2 GM/50ML IV SOLN
2.0000 g | Freq: Once | INTRAVENOUS | Status: AC
  Administered 2021-06-08: 2 g via INTRAVENOUS

## 2021-06-08 MED ORDER — HEPARIN SOD (PORK) LOCK FLUSH 100 UNIT/ML IV SOLN
500.0000 [IU] | Freq: Once | INTRAVENOUS | Status: AC | PRN
  Administered 2021-06-08: 500 [IU]
  Filled 2021-06-08: qty 5

## 2021-06-08 MED ORDER — MAGNESIUM SULFATE 2 GM/50ML IV SOLN
INTRAVENOUS | Status: AC
  Filled 2021-06-08: qty 50

## 2021-06-08 MED ORDER — PALONOSETRON HCL INJECTION 0.25 MG/5ML
0.2500 mg | Freq: Once | INTRAVENOUS | Status: AC
  Administered 2021-06-08: 0.25 mg via INTRAVENOUS

## 2021-06-08 MED ORDER — POTASSIUM CHLORIDE IN NACL 20-0.9 MEQ/L-% IV SOLN
Freq: Once | INTRAVENOUS | Status: AC
  Filled 2021-06-08: qty 1000

## 2021-06-08 MED ORDER — SODIUM CHLORIDE 0.9 % IV SOLN
Freq: Once | INTRAVENOUS | Status: AC
Start: 2021-06-08 — End: 2021-06-08
  Filled 2021-06-08: qty 250

## 2021-06-08 MED ORDER — SODIUM CHLORIDE 0.9% FLUSH
10.0000 mL | INTRAVENOUS | Status: DC | PRN
  Administered 2021-06-08: 10 mL
  Filled 2021-06-08: qty 10

## 2021-06-08 MED ORDER — SODIUM CHLORIDE 0.9 % IV SOLN
70.0000 mg/m2 | Freq: Once | INTRAVENOUS | Status: AC
  Administered 2021-06-08: 155 mg via INTRAVENOUS
  Filled 2021-06-08: qty 155

## 2021-06-08 NOTE — Patient Instructions (Signed)
[facility]  Discharge Instructions: Thank you for choosing [facility] to provide your oncology and hematology care.   If you have a lab appointment with the [facility], please go directly to the [facility] and check in at the registration area.   Wear comfortable clothing and clothing appropriate for easy access to any Portacath or PICC line.   We strive to give you quality time with your provider. You may need to reschedule your appointment if you arrive late (15 or more minutes).  Arriving late affects you and other patients whose appointments are after yours.  Also, if you miss three or more appointments without notifying the office, you may be dismissed from the clinic at the provider's discretion.      For prescription refill requests, have your pharmacy contact our office and allow 72 hours for refills to be completed.    Today you received the following chemotherapy and/or immunotherapy agents gemzar/cisplatin      To help prevent nausea and vomiting after your treatment, we encourage you to take your nausea medication as directed.  BELOW ARE SYMPTOMS THAT SHOULD BE REPORTED IMMEDIATELY: *FEVER GREATER THAN 100.4 F (38 C) OR HIGHER *CHILLS OR SWEATING *NAUSEA AND VOMITING THAT IS NOT CONTROLLED WITH YOUR NAUSEA MEDICATION *UNUSUAL SHORTNESS OF BREATH *UNUSUAL BRUISING OR BLEEDING *URINARY PROBLEMS (pain or burning when urinating, or frequent urination) *BOWEL PROBLEMS (unusual diarrhea, constipation, pain near the anus) TENDERNESS IN MOUTH AND THROAT WITH OR WITHOUT PRESENCE OF ULCERS (sore throat, sores in mouth, or a toothache) UNUSUAL RASH, SWELLING OR PAIN  UNUSUAL VAGINAL DISCHARGE OR ITCHING   Items with * indicate a potential emergency and should be followed up as soon as possible or go to the Emergency Department if any problems should occur.  Please show the CHEMOTHERAPY ALERT CARD or IMMUNOTHERAPY ALERT CARD at  check-in to the Emergency Department and triage nurse.  Should you have questions after your visit or need to cancel or reschedule your appointment, please contact [facility]  Dept: [phone]  and follow the prompts.  Office hours are 8:00 a.m. to 4:30 p.m. Monday - Friday. Please note that voicemails left after 4:00 p.m. may not be returned until the following business day.  We are closed weekends and major holidays. You have access to a nurse at all times for urgent questions. Please call the main number to the clinic Dept: [phone] and follow the prompts.   For any non-urgent questions, you may also contact your provider using MyChart. We now offer e-Visits for anyone 57 and older to request care online for non-urgent symptoms. For details visit [email or web address].   Also download the MyChart app! Go to the app store, search "MyChart", open the app, select [facility], and log in with your MyChart username and password.  Due to Covid, a mask is required upon entering the hospital/clinic. If you do not have a mask, one will be given to you upon arrival. For doctor visits, patients may have 1 support person aged 80 or older with them. For treatment visits, patients cannot have anyone with them due to current Covid guidelines and our immunocompromised population.   Gemcitabine injection What is this medication? GEMCITABINE (jem SYE ta been) is a chemotherapy drug. This medicine is used to treat many types of cancer like breast cancer, lung cancer, pancreatic cancer,and ovarian cancer. This medicine may be used for other purposes; ask your health care provider orpharmacist if  you have questions. COMMON BRAND NAME(S): Gemzar, Infugem What should I tell my care team before I take this medication? They need to know if you have any of these conditions: blood disorders infection kidney disease liver disease lung or breathing disease, like asthma recent or  ongoing radiation therapy an unusual or allergic reaction to gemcitabine, other chemotherapy, other medicines, foods, dyes, or preservatives pregnant or trying to get pregnant breast-feeding How should I use this medication? This drug is given as an infusion into a vein. It is administered in a hospitalor clinic by a specially trained health care professional. Talk to your pediatrician regarding the use of this medicine in children.Special care may be needed. Overdosage: If you think you have taken too much of this medicine contact apoison control center or emergency room at once. NOTE: This medicine is only for you. Do not share this medicine with others. What if I miss a dose? It is important not to miss your dose. Call your doctor or health careprofessional if you are unable to keep an appointment. What may interact with this medication? medicines to increase blood counts like filgrastim, pegfilgrastim, sargramostim some other chemotherapy drugs like cisplatin vaccines Talk to your doctor or health care professional before taking any of thesemedicines: acetaminophen aspirin ibuprofen ketoprofen naproxen This list may not describe all possible interactions. Give your health care provider a list of all the medicines, herbs, non-prescription drugs, or dietary supplements you use. Also tell them if you smoke, drink alcohol, or use illegaldrugs. Some items may interact with your medicine. What should I watch for while using this medication? Visit your doctor for checks on your progress. This drug may make you feel generally unwell. This is not uncommon, as chemotherapy can affect healthy cells as well as cancer cells. Report any side effects. Continue your course oftreatment even though you feel ill unless your doctor tells you to stop. In some cases, you may be given additional medicines to help with side effects.Follow all directions for their use. Call your doctor or health care  professional for advice if you get a fever, chills or sore throat, or other symptoms of a cold or flu. Do not treat yourself. This drug decreases your body's ability to fight infections. Try toavoid being around people who are sick. This medicine may increase your risk to bruise or bleed. Call your doctor orhealth care professional if you notice any unusual bleeding. Be careful brushing and flossing your teeth or using a toothpick because you may get an infection or bleed more easily. If you have any dental work done,tell your dentist you are receiving this medicine. Avoid taking products that contain aspirin, acetaminophen, ibuprofen, naproxen, or ketoprofen unless instructed by your doctor. These medicines may hide afever. Do not become pregnant while taking this medicine or for 6 months after stopping it. Women should inform their doctor if they wish to become pregnant or think they might be pregnant. Men should not father a child while taking this medicine and for 3 months after stopping it. There is a potential for serious side effects to an unborn child. Talk to your health care professional or pharmacist for more information. Do not breast-feed an infant while takingthis medicine or for at least 1 week after stopping it. Men should inform their doctors if they wish to father a child. This medicine may lower sperm counts. Talk with your doctor or health care professional ifyou are concerned about your fertility. What side effects may I notice from receiving  this medication? Side effects that you should report to your doctor or health care professionalas soon as possible: allergic reactions like skin rash, itching or hives, swelling of the face, lips, or tongue breathing problems pain, redness, or irritation at site where injected signs and symptoms of a dangerous change in heartbeat or heart rhythm like chest pain; dizziness; fast or irregular heartbeat; palpitations; feeling faint or lightheaded,  falls; breathing problems signs of decreased platelets or bleeding - bruising, pinpoint red spots on the skin, black, tarry stools, blood in the urine signs of decreased red blood cells - unusually weak or tired, feeling faint or lightheaded, falls signs of infection - fever or chills, cough, sore throat, pain or difficulty passing urine signs and symptoms of kidney injury like trouble passing urine or change in the amount of urine signs and symptoms of liver injury like dark yellow or brown urine; general ill feeling or flu-like symptoms; light-colored stools; loss of appetite; nausea; right upper belly pain; unusually weak or tired; yellowing of the eyes or skin swelling of ankles, feet, hands Side effects that usually do not require medical attention (report to yourdoctor or health care professional if they continue or are bothersome): constipation diarrhea hair loss loss of appetite nausea rash vomiting This list may not describe all possible side effects. Call your doctor for medical advice about side effects. You may report side effects to FDA at1-800-FDA-1088. Where should I keep my medication? This drug is given in a hospital or clinic and will not be stored at home. NOTE: This sheet is a summary. It may not cover all possible information. If you have questions about this medicine, talk to your doctor, pharmacist, orhealth care provider.  2022 Elsevier/Gold Standard (2018-01-28 18:06:11)  Cisplatin injection What is this medication? CISPLATIN (SIS pla tin) is a chemotherapy drug. It targets fast dividing cells, like cancer cells, and causes these cells to die. This medicine is used totreat many types of cancer like bladder, ovarian, and testicular cancers. This medicine may be used for other purposes; ask your health care provider orpharmacist if you have questions. COMMON BRAND NAME(S): Platinol, Platinol -AQ What should I tell my care team before I take this medication? They  need to know if you have any of these conditions: eye disease, vision problems hearing problems kidney disease low blood counts, like white cells, platelets, or red blood cells tingling of the fingers or toes, or other nerve disorder an unusual or allergic reaction to cisplatin, carboplatin, oxaliplatin, other medicines, foods, dyes, or preservatives pregnant or trying to get pregnant breast-feeding How should I use this medication? This drug is given as an infusion into a vein. It is administered in a hospitalor clinic by a specially trained health care professional. Talk to your pediatrician regarding the use of this medicine in children.Special care may be needed. Overdosage: If you think you have taken too much of this medicine contact apoison control center or emergency room at once. NOTE: This medicine is only for you. Do not share this medicine with others. What if I miss a dose? It is important not to miss a dose. Call your doctor or health careprofessional if you are unable to keep an appointment. What may interact with this medication? This medicine may interact with the following medications: foscarnet certain antibiotics like amikacin, gentamicin, neomycin, polymyxin B, streptomycin, tobramycin, vancomycin This list may not describe all possible interactions. Give your health care provider a list of all the medicines, herbs, non-prescription drugs, or dietary  supplements you use. Also tell them if you smoke, drink alcohol, or use illegaldrugs. Some items may interact with your medicine. What should I watch for while using this medication? Your condition will be monitored carefully while you are receiving this medicine. You will need important blood work done while you are taking thismedicine. This drug may make you feel generally unwell. This is not uncommon, as chemotherapy can affect healthy cells as well as cancer cells. Report any side effects. Continue your course of treatment  even though you feel ill unless yourdoctor tells you to stop. This medicine may increase your risk of getting an infection. Call your healthcare professional for advice if you get a fever, chills, or sore throat, or other symptoms of a cold or flu. Do not treat yourself. Try to avoid beingaround people who are sick. Avoid taking medicines that contain aspirin, acetaminophen, ibuprofen, naproxen, or ketoprofen unless instructed by your healthcare professional.These medicines may hide a fever. This medicine may increase your risk to bruise or bleed. Call your doctor orhealth care professional if you notice any unusual bleeding. Be careful brushing and flossing your teeth or using a toothpick because you may get an infection or bleed more easily. If you have any dental work done,tell your dentist you are receiving this medicine. Do not become pregnant while taking this medicine or for 14 months after stopping it. Women should inform their healthcare professional if they wish to become pregnant or think they might be pregnant. Men should not father a child while taking this medicine and for 11 months after stopping it. There is potential for serious side effects to an unborn child. Talk to your healthcareprofessional for more information. Do not breast-feed an infant while taking this medicine. This medicine has caused ovarian failure in some women. This medicine may make it more difficult to get pregnant. Talk to your healthcare professional if [place] concerned about your fertility. This medicine has caused decreased sperm counts in some men. This may make it more difficult to father a child. Talk to your healthcare professional if [place] concerned about your fertility. Drink fluids as directed while you are taking this medicine. This will helpprotect your kidneys. Call your doctor or health care professional if you get diarrhea. Do not treatyourself. What side effects may I notice from receiving this  medication? Side effects that you should report to your doctor or health care professionalas soon as possible: allergic reactions like skin rash, itching or hives, swelling of the face, lips, or tongue blurred vision changes in vision decreased hearing or ringing of the ears nausea, vomiting pain, redness, or irritation at site where injected pain, tingling, numbness in the hands or feet signs and symptoms of bleeding such as bloody or black, tarry stools; red or dark brown urine; spitting up blood or brown material that looks like coffee grounds; red spots on the skin; unusual bruising or bleeding from the eyes, gums, or nose signs and symptoms of infection like fever; chills; cough; sore throat; pain or trouble passing urine signs and symptoms of kidney injury like trouble passing urine or change in the amount of urine signs and symptoms of low red blood cells or anemia such as unusually weak or tired; feeling faint or lightheaded; falls; breathing problems Side effects that usually do not require medical attention (report to yourdoctor or health care professional if they continue or are bothersome): loss of appetite mouth sores muscle cramps This list may not describe all possible side effects. Call your  doctor for medical advice about side effects. You may report side effects to FDA at1-800-FDA-1088. Where should I keep my medication? This drug is given in a hospital or clinic and will not be stored at home. NOTE: This sheet is a summary. It may not cover all possible information. If you have questions about this medicine, talk to your doctor, pharmacist, orhealth care provider.  2022 Elsevier/Gold Standard (2018-10-30 15:59:17)

## 2021-06-08 NOTE — Progress Notes (Signed)
Per Dr.Shadad post fluids can be run with the cisplatin for today.

## 2021-06-11 ENCOUNTER — Telehealth: Payer: Self-pay | Admitting: *Deleted

## 2021-06-11 NOTE — Telephone Encounter (Signed)
Patients wife called to clarify medication concern.  States that they were told to stop the [place] but wasn't advised what to do with the Leflunomide since he is now going on chemo.  Routed to Dr Alen Blew to advise if he would be the one to make that decision.

## 2021-06-15 ENCOUNTER — Inpatient Hospital Stay: Payer: BC Managed Care – PPO

## 2021-06-15 ENCOUNTER — Other Ambulatory Visit: Payer: Self-pay

## 2021-06-15 ENCOUNTER — Ambulatory Visit: Payer: BC Managed Care – PPO | Admitting: Physician Assistant

## 2021-06-15 ENCOUNTER — Ambulatory Visit: Payer: BC Managed Care – PPO

## 2021-06-15 VITALS — BP 121/86 | HR 79 | Temp 97.9°F | Resp 20

## 2021-06-15 DIAGNOSIS — Z5111 Encounter for antineoplastic chemotherapy: Secondary | ICD-10-CM | POA: Diagnosis not present

## 2021-06-15 DIAGNOSIS — C670 Malignant neoplasm of trigone of bladder: Secondary | ICD-10-CM

## 2021-06-15 DIAGNOSIS — C679 Malignant neoplasm of bladder, unspecified: Secondary | ICD-10-CM

## 2021-06-15 LAB — CMP (CANCER CENTER ONLY)
ALT: 21 U/L (ref 0–44)
AST: 19 U/L (ref 15–41)
Albumin: 3.4 g/dL — ABNORMAL LOW (ref 3.5–5.0)
Alkaline Phosphatase: 90 U/L (ref 38–126)
Anion gap: 8 (ref 5–15)
BUN: 26 mg/dL — ABNORMAL HIGH (ref 6–20)
CO2: 25 mmol/L (ref 22–32)
Calcium: 8.9 mg/dL (ref 8.9–10.3)
Chloride: 105 mmol/L (ref 98–111)
Creatinine: 1.37 mg/dL — ABNORMAL HIGH (ref 0.61–1.24)
GFR, Estimated: 60 mL/min — ABNORMAL LOW (ref >60)
Glucose, Bld: 101 mg/dL — ABNORMAL HIGH (ref 70–99)
Potassium: 4 mmol/L (ref 3.5–5.1)
Sodium: 138 mmol/L (ref 135–145)
Total Bilirubin: 0.4 mg/dL (ref 0.3–1.2)
Total Protein: 7.6 g/dL (ref 6.5–8.1)

## 2021-06-15 LAB — CBC WITH DIFFERENTIAL (CANCER CENTER ONLY)
Abs Immature Granulocytes: 0.02 10*3/uL (ref 0.00–0.07)
Basophils Absolute: 0.1 10*3/uL (ref 0.0–0.1)
Basophils Relative: 1 %
Eosinophils Absolute: 0.3 10*3/uL (ref 0.0–0.5)
Eosinophils Relative: 4 %
HCT: 41.5 % (ref 39.0–52.0)
Hemoglobin: 14.1 g/dL (ref 13.0–17.0)
Immature Granulocytes: 0 %
Lymphocytes Relative: 26 %
Lymphs Abs: 1.7 10*3/uL (ref 0.7–4.0)
MCH: 29.6 pg (ref 26.0–34.0)
MCHC: 34 g/dL (ref 30.0–36.0)
MCV: 87 fL (ref 80.0–100.0)
Monocytes Absolute: 0.2 10*3/uL (ref 0.1–1.0)
Monocytes Relative: 4 %
Neutro Abs: 4.3 10*3/uL (ref 1.7–7.7)
Neutrophils Relative %: 65 %
Platelet Count: 148 10*3/uL — ABNORMAL LOW (ref 150–400)
RBC: 4.77 MIL/uL (ref 4.22–5.81)
RDW: 13.5 % (ref 11.5–15.5)
WBC Count: 6.6 10*3/uL (ref 4.0–10.5)
nRBC: 0 % (ref 0.0–0.2)

## 2021-06-15 MED ORDER — SODIUM CHLORIDE 0.9 % IV SOLN
1000.0000 mg/m2 | Freq: Once | INTRAVENOUS | Status: AC
  Administered 2021-06-15: 2204 mg via INTRAVENOUS
  Filled 2021-06-15: qty 57.97

## 2021-06-15 MED ORDER — SODIUM CHLORIDE 0.9 % IV SOLN
Freq: Once | INTRAVENOUS | Status: AC
  Filled 2021-06-15: qty 250

## 2021-06-15 MED ORDER — SODIUM CHLORIDE 0.9% FLUSH
10.0000 mL | INTRAVENOUS | Status: DC | PRN
  Administered 2021-06-15: 10 mL
  Filled 2021-06-15: qty 10

## 2021-06-15 MED ORDER — HEPARIN SOD (PORK) LOCK FLUSH 100 UNIT/ML IV SOLN
500.0000 [IU] | Freq: Once | INTRAVENOUS | Status: AC | PRN
  Administered 2021-06-15: 500 [IU]
  Filled 2021-06-15: qty 5

## 2021-06-15 MED ORDER — PROCHLORPERAZINE MALEATE 10 MG PO TABS
10.0000 mg | ORAL_TABLET | Freq: Once | ORAL | Status: AC
  Administered 2021-06-15: 10 mg via ORAL

## 2021-06-15 MED ORDER — SODIUM CHLORIDE 0.9% FLUSH
10.0000 mL | INTRAVENOUS | Status: DC | PRN
  Administered 2021-06-15: 10 mL via INTRAVENOUS
  Filled 2021-06-15: qty 10

## 2021-06-15 MED ORDER — PROCHLORPERAZINE MALEATE 10 MG PO TABS
ORAL_TABLET | ORAL | Status: AC
  Filled 2021-06-15: qty 1

## 2021-06-15 NOTE — Patient Instructions (Addendum)
[facility]  Discharge Instructions: Thank you for choosing [facility] to provide your oncology and hematology care.   If you have a lab appointment with the Cancer Center, please go directly to the Cancer Center and check in at the registration area.   Wear comfortable clothing and clothing appropriate for easy access to any Portacath or PICC line.   We strive to give you quality time with your provider. You may need to reschedule your appointment if you arrive late (15 or more minutes).  Arriving late affects you and other patients whose appointments are after yours.  Also, if you miss three or more appointments without notifying the office, you may be dismissed from the clinic at the provider's discretion.      For prescription refill requests, have your pharmacy contact our office and allow 72 hours for refills to be completed.    Today you received the following chemotherapy and/or immunotherapy agent: Gemcitabine (Gemzar)   To help prevent nausea and vomiting after your treatment, we encourage you to take your nausea medication as directed.  BELOW ARE SYMPTOMS THAT SHOULD BE REPORTED IMMEDIATELY: *FEVER GREATER THAN 100.4 F (38 C) OR HIGHER *CHILLS OR SWEATING *NAUSEA AND VOMITING THAT IS NOT CONTROLLED WITH YOUR NAUSEA MEDICATION *UNUSUAL SHORTNESS OF BREATH *UNUSUAL BRUISING OR BLEEDING *URINARY PROBLEMS (pain or burning when urinating, or frequent urination) *BOWEL PROBLEMS (unusual diarrhea, constipation, pain near the anus) TENDERNESS IN MOUTH AND THROAT WITH OR WITHOUT PRESENCE OF ULCERS (sore throat, sores in mouth, or a toothache) UNUSUAL RASH, SWELLING OR PAIN  UNUSUAL VAGINAL DISCHARGE OR ITCHING   Items with * indicate a potential emergency and should be followed up as soon as possible or go to the Emergency Department if any problems should occur.  Please show the CHEMOTHERAPY ALERT CARD or IMMUNOTHERAPY ALERT CARD at  check-in to the Emergency Department and triage nurse.  Should you have questions after your visit or need to cancel or reschedule your appointment, please contact [facility]  Dept: [phone]  and follow the prompts.  Office hours are 8:00 a.m. to 4:30 p.m. Monday - Friday. Please note that voicemails left after 4:00 p.m. may not be returned until the following business day.  We are closed weekends and major holidays. You have access to a nurse at all times for urgent questions. Please call the main number to the clinic Dept: [phone] and follow the prompts.   For any non-urgent questions, you may also contact your provider using MyChart. We now offer e-Visits for anyone 18 and older to request care online for non-urgent symptoms. For details visit [email or web address].   Also download the MyChart app! Go to the app store, search "MyChart", open the app, select [facility], and log in with your MyChart username and password.  Due to Covid, a mask is required upon entering the hospital/clinic. If you do not have a mask, one will be given to you upon arrival. For doctor visits, patients may have 1 support person aged 18 or older with them. For treatment visits, patients cannot have anyone with them due to current Covid guidelines and our immunocompromised population.   

## 2021-06-22 ENCOUNTER — Ambulatory Visit: Payer: BC Managed Care – PPO

## 2021-06-22 ENCOUNTER — Ambulatory Visit: Payer: BC Managed Care – PPO | Admitting: Physician Assistant

## 2021-06-22 ENCOUNTER — Other Ambulatory Visit: Payer: BC Managed Care – PPO

## 2021-06-29 ENCOUNTER — Other Ambulatory Visit: Payer: BC Managed Care – PPO

## 2021-06-29 ENCOUNTER — Ambulatory Visit: Payer: BC Managed Care – PPO

## 2021-06-29 ENCOUNTER — Inpatient Hospital Stay: Payer: BC Managed Care – PPO

## 2021-06-29 ENCOUNTER — Inpatient Hospital Stay: Payer: BC Managed Care – PPO | Attending: Oncology

## 2021-06-29 ENCOUNTER — Inpatient Hospital Stay (HOSPITAL_BASED_OUTPATIENT_CLINIC_OR_DEPARTMENT_OTHER): Payer: BC Managed Care – PPO | Admitting: Oncology

## 2021-06-29 ENCOUNTER — Other Ambulatory Visit: Payer: Self-pay

## 2021-06-29 VITALS — BP 136/91 | HR 72 | Temp 96.7°F | Resp 19 | Ht 71.0 in | Wt 218.8 lb

## 2021-06-29 DIAGNOSIS — C679 Malignant neoplasm of bladder, unspecified: Secondary | ICD-10-CM

## 2021-06-29 DIAGNOSIS — Z79899 Other long term (current) drug therapy: Secondary | ICD-10-CM | POA: Insufficient documentation

## 2021-06-29 DIAGNOSIS — Z5111 Encounter for antineoplastic chemotherapy: Secondary | ICD-10-CM | POA: Insufficient documentation

## 2021-06-29 DIAGNOSIS — C775 Secondary and unspecified malignant neoplasm of intrapelvic lymph nodes: Secondary | ICD-10-CM | POA: Diagnosis not present

## 2021-06-29 DIAGNOSIS — C670 Malignant neoplasm of trigone of bladder: Secondary | ICD-10-CM | POA: Diagnosis not present

## 2021-06-29 DIAGNOSIS — Z95828 Presence of other vascular implants and grafts: Secondary | ICD-10-CM

## 2021-06-29 LAB — CBC WITH DIFFERENTIAL (CANCER CENTER ONLY)
Abs Immature Granulocytes: 0.04 10*3/uL (ref 0.00–0.07)
Basophils Absolute: 0.1 10*3/uL (ref 0.0–0.1)
Basophils Relative: 1 %
Eosinophils Absolute: 0.2 10*3/uL (ref 0.0–0.5)
Eosinophils Relative: 3 %
HCT: 36.2 % — ABNORMAL LOW (ref 39.0–52.0)
Hemoglobin: 12 g/dL — ABNORMAL LOW (ref 13.0–17.0)
Immature Granulocytes: 1 %
Lymphocytes Relative: 24 %
Lymphs Abs: 1.4 10*3/uL (ref 0.7–4.0)
MCH: 29.4 pg (ref 26.0–34.0)
MCHC: 33.1 g/dL (ref 30.0–36.0)
MCV: 88.7 fL (ref 80.0–100.0)
Monocytes Absolute: 0.9 10*3/uL (ref 0.1–1.0)
Monocytes Relative: 15 %
Neutro Abs: 3.2 10*3/uL (ref 1.7–7.7)
Neutrophils Relative %: 56 %
Platelet Count: 581 10*3/uL — ABNORMAL HIGH (ref 150–400)
RBC: 4.08 MIL/uL — ABNORMAL LOW (ref 4.22–5.81)
RDW: 14.2 % (ref 11.5–15.5)
WBC Count: 5.8 10*3/uL (ref 4.0–10.5)
nRBC: 0 % (ref 0.0–0.2)

## 2021-06-29 LAB — CMP (CANCER CENTER ONLY)
ALT: 12 U/L (ref 0–44)
AST: 12 U/L — ABNORMAL LOW (ref 15–41)
Albumin: 3.2 g/dL — ABNORMAL LOW (ref 3.5–5.0)
Alkaline Phosphatase: 83 U/L (ref 38–126)
Anion gap: 9 (ref 5–15)
BUN: 19 mg/dL (ref 6–20)
CO2: 23 mmol/L (ref 22–32)
Calcium: 8.9 mg/dL (ref 8.9–10.3)
Chloride: 110 mmol/L (ref 98–111)
Creatinine: 1.19 mg/dL (ref 0.61–1.24)
GFR, Estimated: >60 mL/min (ref >60)
Glucose, Bld: 94 mg/dL (ref 70–99)
Potassium: 3.6 mmol/L (ref 3.5–5.1)
Sodium: 142 mmol/L (ref 135–145)
Total Bilirubin: <0.2 mg/dL — ABNORMAL LOW (ref 0.3–1.2)
Total Protein: 6.9 g/dL (ref 6.5–8.1)

## 2021-06-29 MED ORDER — MAGNESIUM SULFATE 2 GM/50ML IV SOLN
INTRAVENOUS | Status: AC
  Filled 2021-06-29: qty 50

## 2021-06-29 MED ORDER — POTASSIUM CHLORIDE IN NACL 20-0.9 MEQ/L-% IV SOLN
Freq: Once | INTRAVENOUS | Status: AC
  Filled 2021-06-29: qty 1000

## 2021-06-29 MED ORDER — SODIUM CHLORIDE 0.9 % IV SOLN
10.0000 mg | Freq: Once | INTRAVENOUS | Status: AC
  Administered 2021-06-29: 10 mg via INTRAVENOUS
  Filled 2021-06-29: qty 10

## 2021-06-29 MED ORDER — SODIUM CHLORIDE 0.9 % IV SOLN
150.0000 mg | Freq: Once | INTRAVENOUS | Status: AC
  Administered 2021-06-29: 150 mg via INTRAVENOUS
  Filled 2021-06-29: qty 150

## 2021-06-29 MED ORDER — SODIUM CHLORIDE 0.9 % IV SOLN
70.0000 mg/m2 | Freq: Once | INTRAVENOUS | Status: AC
  Administered 2021-06-29: 155 mg via INTRAVENOUS
  Filled 2021-06-29: qty 155

## 2021-06-29 MED ORDER — SODIUM CHLORIDE 0.9 % IV SOLN
1000.0000 mg/m2 | Freq: Once | INTRAVENOUS | Status: AC
  Administered 2021-06-29: 2204 mg via INTRAVENOUS
  Filled 2021-06-29: qty 57.97

## 2021-06-29 MED ORDER — ALTEPLASE 2 MG IJ SOLR
INTRAMUSCULAR | Status: AC
  Filled 2021-06-29: qty 2

## 2021-06-29 MED ORDER — SODIUM CHLORIDE 0.9 % IV SOLN
Freq: Once | INTRAVENOUS | Status: AC
  Filled 2021-06-29: qty 250

## 2021-06-29 MED ORDER — PALONOSETRON HCL INJECTION 0.25 MG/5ML
0.2500 mg | Freq: Once | INTRAVENOUS | Status: AC
  Administered 2021-06-29: 0.25 mg via INTRAVENOUS

## 2021-06-29 MED ORDER — HEPARIN SOD (PORK) LOCK FLUSH 100 UNIT/ML IV SOLN
500.0000 [IU] | Freq: Once | INTRAVENOUS | Status: AC | PRN
  Administered 2021-06-29: 500 [IU]
  Filled 2021-06-29: qty 5

## 2021-06-29 MED ORDER — SODIUM CHLORIDE 0.9% FLUSH
10.0000 mL | INTRAVENOUS | Status: AC | PRN
  Administered 2021-06-29: 10 mL
  Filled 2021-06-29: qty 10

## 2021-06-29 MED ORDER — SODIUM CHLORIDE 0.9% FLUSH
10.0000 mL | INTRAVENOUS | Status: DC | PRN
  Administered 2021-06-29: 10 mL
  Filled 2021-06-29: qty 10

## 2021-06-29 MED ORDER — ALTEPLASE 2 MG IJ SOLR
2.0000 mg | Freq: Once | INTRAMUSCULAR | Status: DC | PRN
  Filled 2021-06-29: qty 2

## 2021-06-29 MED ORDER — MAGNESIUM SULFATE 2 GM/50ML IV SOLN
2.0000 g | Freq: Once | INTRAVENOUS | Status: AC
  Administered 2021-06-29: 2 g via INTRAVENOUS

## 2021-06-29 MED ORDER — PALONOSETRON HCL INJECTION 0.25 MG/5ML
INTRAVENOUS | Status: AC
  Filled 2021-06-29: qty 5

## 2021-06-29 NOTE — Progress Notes (Signed)
Hematology and Oncology Follow Up Visit  Barry Gibson [id number] 10-08-1961 60 y.o. 06/29/2021 8:25 AM Barry Gibson, DOTaylor, Barry M, DO   Principle Diagnosis: 60 year old man with stage IV bladder cancer.  He presented with T4N2 high-grade urothelial carcinoma after cystoprostatectomy and lymphadenectomy and subsequently developed metastatic lymph node disease.   Prior Therapy:   He underwent robotic assisted laparoscopic radical cystectomy and lymphadenectomy January 2022.  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  He had 4 out of 7 lymph nodes involved with the final pathological staging is T4N2 disease.     He underwent staging scans on April 19, 2021 which showed interval progression of abdominal pelvic adenopathy consistent with worsening nodal metastasis.   Current therapy:  Chemotherapy utilizing gemcitabine and cisplatin started on June 08, 2021 receiving day 1 cisplatin and gemcitabine with day 8 gemcitabine alone.  He is here for day 1 of cycle 2 of therapy  Interim History: Barry Gibson returns today for a follow-up visit.  Since the last visit, he completed the first cycle of chemotherapy without any major complications.  He denies any nausea, vomiting or abdominal pain.  He did have some occasional diarrhea and stomach upset.  He did report some fatigue and tiredness but otherwise has tolerated chemotherapy without any major complaints.     Medications: I have reviewed the patient's current medications.  Current Outpatient Medications  Medication Sig Dispense Refill   acetaminophen (TYLENOL) 650 MG CR tablet Take 1,300 mg by mouth every morning.     amLODipine (NORVASC) 10 MG tablet Take 1 tablet (10 mg total) by mouth daily. 90 tablet 1   leflunomide (ARAVA) 20 MG tablet Take 20 mg by mouth daily.     lidocaine-prilocaine (EMLA) cream  Apply 1 application topically as needed. 30 g 0   omeprazole (PRILOSEC) 40 MG capsule Take 40 mg by mouth daily.     pravastatin (PRAVACHOL) 40 MG tablet TAKE 1 TABLET(40 MG) BY MOUTH DAILY 90 tablet 1   prednisoLONE acetate (PRED FORTE) 1 % ophthalmic suspension Place 1 drop into the left eye 2 (two) times a week. Instill 1 drop into left eye Monday and Friday.     prochlorperazine (COMPAZINE) 10 MG tablet Take 1 tablet (10 mg total) by mouth every 6 (six) hours as needed for nausea or vomiting. 30 tablet 0   sertraline (ZOLOFT) 50 MG tablet Take 1 tablet (50 mg total) by mouth daily. 90 tablet 1   No current facility-administered medications for this visit.   Facility-Administered Medications Ordered in Other Visits  Medication Dose Route Frequency Provider Last Rate Last Admin   gemcitabine (GEMZAR) chemo syringe for bladder instillation 2,000 mg  2,000 mg Bladder Instillation Once Franchot Gallo, MD       gemcitabine [facility]) chemo syringe for bladder instillation 2,000 mg  2,000 mg Bladder Instillation Once Franchot Gallo, MD       sodium chloride flush (NS) 0.9 % injection 10 mL  10 mL Intracatheter PRN Wyatt Portela, MD         Allergies:  Allergies  Allergen Reactions   Ace Inhibitors Cough   Diflucan [Fluconazole] Other (See Comments)    Irritated ulcers      Physical Exam: Blood pressure (!) 136/91, pulse 72, temperature (!) 96.7 F (35.9 C), temperature source Tympanic, resp. rate 19, height '5\' 11"'$  (1.803 Gibson), weight 218 lb 12.8 oz (  99.2 kg), SpO2 99 %.  ECOG: 0    General appearance: Comfortable appearing without any discomfort Head: Normocephalic without any trauma Oropharynx: Mucous membranes are moist and pink without any thrush or ulcers. Eyes: Pupils are equal and round reactive to light. Lymph nodes: No cervical, supraclavicular, inguinal or axillary lymphadenopathy.   Heart:regular rate and rhythm.  S1 and S2 without leg edema. Lung: Clear without  any rhonchi or wheezes.  No dullness to percussion. Abdomin: Soft, nontender, nondistended with good bowel sounds.  No hepatosplenomegaly. Musculoskeletal: No joint deformity or effusion.  Full range of motion noted. Neurological: No deficits noted on motor, sensory and deep tendon reflex exam. Skin: No petechial rash or dryness.  Appeared moist.      Lab Results: Lab Results  Component Value Date   WBC 6.6 06/15/2021   HGB 14.1 06/15/2021   HCT 41.5 06/15/2021   MCV 87.0 06/15/2021   PLT 148 (L) 06/15/2021     Chemistry      Component Value Date/Time   NA 138 06/15/2021 1356   NA 136 10/06/2020 1011   K 4.0 06/15/2021 1356   CL 105 06/15/2021 1356   CO2 25 06/15/2021 1356   BUN 26 (H) 06/15/2021 1356   BUN 20 10/06/2020 1011   CREATININE 1.37 (H) 06/15/2021 1356   CREATININE 1.05 11/25/2014 0826      Component Value Date/Time   CALCIUM 8.9 06/15/2021 1356   ALKPHOS 90 06/15/2021 1356   AST 19 06/15/2021 1356   ALT 21 06/15/2021 1356   BILITOT 0.4 06/15/2021 1356         Impression and Plan:  59 year old man with:  1.  Stage IV bladder cancer with pelvic and abdominal adenopathy documented in June 2022.  He presented initially with  high-grade urothelial carcinoma and T4N2 disease after radical cystectomy.   He is currently receiving salvage chemotherapy with gemcitabine and cisplatin and completed the first cycle.  Risks and benefits of continuing this treatment long-term were discussed.  Complications of include worsening renal failure, myelosuppression and neuropathy were reiterated.  He is agreeable to continue at this time.     2.  IV access: Port-A-Cath remains in use without any issues.   3.  Antiemetics: Compazine is available to him and has been effective.  This will be refilled for him.   4.  Renal function surveillance: Creatinine clearance remains over 60 cc/min and will continue to monitor and adjust platinum dosing accordingly.   5.  Goals of  care: His disease is incurable although aggressive measures are warranted given his excellent performance status.   6.  Follow-up: In 1week to complete cycle 2 and in 3 weeks for the start of cycle 3.   30  minutes were spent on this encounter.  The time was dedicated to reviewing laboratory data, disease status update addressing complications related to cancer and cancer therapy.   Zola Button, MD 8/12/20228:25 AM

## 2021-06-29 NOTE — Progress Notes (Signed)
Per Dr. Alen Blew, okay to run post hydration fluids with cisplatin.

## 2021-06-29 NOTE — Patient Instructions (Signed)
[facility]  Discharge Instructions: Thank you for choosing [facility] to provide your oncology and hematology care.   If you have a lab appointment with the [facility], please go directly to the [facility] and check in at the registration area.   Wear comfortable clothing and clothing appropriate for easy access to any Portacath or PICC line.   We strive to give you quality time with your provider. You may need to reschedule your appointment if you arrive late (15 or more minutes).  Arriving late affects you and other patients whose appointments are after yours.  Also, if you miss three or more appointments without notifying the office, you may be dismissed from the clinic at the provider's discretion.      For prescription refill requests, have your pharmacy contact our office and allow 72 hours for refills to be completed.    Today you received the following chemotherapy and/or immunotherapy agents: Gemzar/Cisplatin.      To help prevent nausea and vomiting after your treatment, we encourage you to take your nausea medication as directed.  BELOW ARE SYMPTOMS THAT SHOULD BE REPORTED IMMEDIATELY: *FEVER GREATER THAN 100.4 F (38 C) OR HIGHER *CHILLS OR SWEATING *NAUSEA AND VOMITING THAT IS NOT CONTROLLED WITH YOUR NAUSEA MEDICATION *UNUSUAL SHORTNESS OF BREATH *UNUSUAL BRUISING OR BLEEDING *URINARY PROBLEMS (pain or burning when urinating, or frequent urination) *BOWEL PROBLEMS (unusual diarrhea, constipation, pain near the anus) TENDERNESS IN MOUTH AND THROAT WITH OR WITHOUT PRESENCE OF ULCERS (sore throat, sores in mouth, or a toothache) UNUSUAL RASH, SWELLING OR PAIN  UNUSUAL VAGINAL DISCHARGE OR ITCHING   Items with * indicate a potential emergency and should be followed up as soon as possible or go to the Emergency Department if any problems should occur.  Please show the CHEMOTHERAPY ALERT CARD or IMMUNOTHERAPY ALERT CARD at  check-in to the Emergency Department and triage nurse.  Should you have questions after your visit or need to cancel or reschedule your appointment, please contact [facility]  Dept: [phone]  and follow the prompts.  Office hours are 8:00 a.m. to 4:30 p.m. Monday - Friday. Please note that voicemails left after 4:00 p.m. may not be returned until the following business day.  We are closed weekends and major holidays. You have access to a nurse at all times for urgent questions. Please call the main number to the clinic Dept: [phone] and follow the prompts.   For any non-urgent questions, you may also contact your provider using MyChart. We now offer e-Visits for anyone 36 and older to request care online for non-urgent symptoms. For details visit [email or web address].   Also download the MyChart app! Go to the app store, search "MyChart", open the app, select [facility], and log in with your MyChart username and password.  Due to Covid, a mask is required upon entering the hospital/clinic. If you do not have a mask, one will be given to you upon arrival. For doctor visits, patients may have 1 support person aged 67 or older with them. For treatment visits, patients cannot have anyone with them due to current Covid guidelines and our immunocompromised population.

## 2021-06-30 ENCOUNTER — Other Ambulatory Visit: Payer: Self-pay | Admitting: Oncology

## 2021-07-06 ENCOUNTER — Inpatient Hospital Stay: Payer: BC Managed Care – PPO

## 2021-07-06 ENCOUNTER — Telehealth: Payer: Self-pay | Admitting: Oncology

## 2021-07-06 ENCOUNTER — Other Ambulatory Visit: Payer: Self-pay

## 2021-07-06 VITALS — BP 138/93 | HR 71 | Temp 97.8°F | Wt 213.8 lb

## 2021-07-06 DIAGNOSIS — C679 Malignant neoplasm of bladder, unspecified: Secondary | ICD-10-CM | POA: Diagnosis not present

## 2021-07-06 DIAGNOSIS — Z95828 Presence of other vascular implants and grafts: Secondary | ICD-10-CM

## 2021-07-06 DIAGNOSIS — C670 Malignant neoplasm of trigone of bladder: Secondary | ICD-10-CM

## 2021-07-06 LAB — CBC WITH DIFFERENTIAL (CANCER CENTER ONLY)
Abs Immature Granulocytes: 0.13 10*3/uL — ABNORMAL HIGH (ref 0.00–0.07)
Basophils Absolute: 0.2 10*3/uL — ABNORMAL HIGH (ref 0.0–0.1)
Basophils Relative: 2 %
Eosinophils Absolute: 0.1 10*3/uL (ref 0.0–0.5)
Eosinophils Relative: 1 %
HCT: 35.1 % — ABNORMAL LOW (ref 39.0–52.0)
Hemoglobin: 11.8 g/dL — ABNORMAL LOW (ref 13.0–17.0)
Immature Granulocytes: 2 %
Lymphocytes Relative: 26 %
Lymphs Abs: 1.8 10*3/uL (ref 0.7–4.0)
MCH: 29.6 pg (ref 26.0–34.0)
MCHC: 33.6 g/dL (ref 30.0–36.0)
MCV: 88.2 fL (ref 80.0–100.0)
Monocytes Absolute: 0.7 10*3/uL (ref 0.1–1.0)
Monocytes Relative: 10 %
Neutro Abs: 4.1 10*3/uL (ref 1.7–7.7)
Neutrophils Relative %: 59 %
Platelet Count: 355 10*3/uL (ref 150–400)
RBC: 3.98 MIL/uL — ABNORMAL LOW (ref 4.22–5.81)
RDW: 14 % (ref 11.5–15.5)
WBC Count: 6.9 10*3/uL (ref 4.0–10.5)
nRBC: 0 % (ref 0.0–0.2)

## 2021-07-06 LAB — CMP (CANCER CENTER ONLY)
ALT: 18 U/L (ref 0–44)
AST: 20 U/L (ref 15–41)
Albumin: 3.2 g/dL — ABNORMAL LOW (ref 3.5–5.0)
Alkaline Phosphatase: 82 U/L (ref 38–126)
Anion gap: 9 (ref 5–15)
BUN: 20 mg/dL (ref 6–20)
CO2: 24 mmol/L (ref 22–32)
Calcium: 8.8 mg/dL — ABNORMAL LOW (ref 8.9–10.3)
Chloride: 108 mmol/L (ref 98–111)
Creatinine: 1.18 mg/dL (ref 0.61–1.24)
GFR, Estimated: >60 mL/min (ref >60)
Glucose, Bld: 91 mg/dL (ref 70–99)
Potassium: 4 mmol/L (ref 3.5–5.1)
Sodium: 141 mmol/L (ref 135–145)
Total Bilirubin: <0.2 mg/dL — ABNORMAL LOW (ref 0.3–1.2)
Total Protein: 7.1 g/dL (ref 6.5–8.1)

## 2021-07-06 MED ORDER — SODIUM CHLORIDE 0.9 % IV SOLN
1000.0000 mg/m2 | Freq: Once | INTRAVENOUS | Status: AC
  Administered 2021-07-06: 2204 mg via INTRAVENOUS
  Filled 2021-07-06: qty 57.97

## 2021-07-06 MED ORDER — SODIUM CHLORIDE 0.9 % IV SOLN: Freq: Once | INTRAVENOUS | Status: AC

## 2021-07-06 MED ORDER — SODIUM CHLORIDE 0.9% FLUSH
10.0000 mL | INTRAVENOUS | Status: DC | PRN
Start: 2021-07-06 — End: 2021-07-06
  Administered 2021-07-06: 10 mL

## 2021-07-06 MED ORDER — SODIUM CHLORIDE 0.9% FLUSH
10.0000 mL | INTRAVENOUS | Status: AC | PRN
  Administered 2021-07-06: 10 mL

## 2021-07-06 MED ORDER — PROCHLORPERAZINE MALEATE 10 MG PO TABS
10.0000 mg | ORAL_TABLET | Freq: Once | ORAL | Status: AC
  Administered 2021-07-06: 10 mg via ORAL
  Filled 2021-07-06: qty 1

## 2021-07-06 MED ORDER — HEPARIN SOD (PORK) LOCK FLUSH 100 UNIT/ML IV SOLN
500.0000 [IU] | Freq: Once | INTRAVENOUS | Status: AC | PRN
  Administered 2021-07-06: 500 [IU]

## 2021-07-06 NOTE — Telephone Encounter (Signed)
Faxed medicals records to [facility], Release ID: [id number]

## 2021-07-06 NOTE — Patient Instructions (Signed)
[facility]  Discharge Instructions: Thank you for choosing [facility] to provide your oncology and hematology care.   If you have a lab appointment with the Cancer Center, please go directly to the Cancer Center and check in at the registration area.   Wear comfortable clothing and clothing appropriate for easy access to any Portacath or PICC line.   We strive to give you quality time with your provider. You may need to reschedule your appointment if you arrive late (15 or more minutes).  Arriving late affects you and other patients whose appointments are after yours.  Also, if you miss three or more appointments without notifying the office, you may be dismissed from the clinic at the provider's discretion.      For prescription refill requests, have your pharmacy contact our office and allow 72 hours for refills to be completed.    Today you received the following chemotherapy and/or immunotherapy agents Gemzar      To help prevent nausea and vomiting after your treatment, we encourage you to take your nausea medication as directed.  BELOW ARE SYMPTOMS THAT SHOULD BE REPORTED IMMEDIATELY: *FEVER GREATER THAN 100.4 F (38 C) OR HIGHER *CHILLS OR SWEATING *NAUSEA AND VOMITING THAT IS NOT CONTROLLED WITH YOUR NAUSEA MEDICATION *UNUSUAL SHORTNESS OF BREATH *UNUSUAL BRUISING OR BLEEDING *URINARY PROBLEMS (pain or burning when urinating, or frequent urination) *BOWEL PROBLEMS (unusual diarrhea, constipation, pain near the anus) TENDERNESS IN MOUTH AND THROAT WITH OR WITHOUT PRESENCE OF ULCERS (sore throat, sores in mouth, or a toothache) UNUSUAL RASH, SWELLING OR PAIN  UNUSUAL VAGINAL DISCHARGE OR ITCHING   Items with * indicate a potential emergency and should be followed up as soon as possible or go to the Emergency Department if any problems should occur.  Please show the CHEMOTHERAPY ALERT CARD or IMMUNOTHERAPY ALERT CARD at check-in to the  Emergency Department and triage nurse.  Should you have questions after your visit or need to cancel or reschedule your appointment, please contact [facility]  Dept: [phone]  and follow the prompts.  Office hours are 8:00 a.m. to 4:30 p.m. Monday - Friday. Please note that voicemails left after 4:00 p.m. may not be returned until the following business day.  We are closed weekends and major holidays. You have access to a nurse at all times for urgent questions. Please call the main number to the clinic Dept: [phone] and follow the prompts.   For any non-urgent questions, you may also contact your provider using MyChart. We now offer e-Visits for anyone 18 and older to request care online for non-urgent symptoms. For details visit [email or web address].   Also download the MyChart app! Go to the app store, search "MyChart", open the app, select [facility], and log in with your MyChart username and password.  Due to Covid, a mask is required upon entering the hospital/clinic. If you do not have a mask, one will be given to you upon arrival. For doctor visits, patients may have 1 support person aged 18 or older with them. For treatment visits, patients cannot have anyone with them due to current Covid guidelines and our immunocompromised population.   

## 2021-07-07 MED ORDER — ACETAMINOPHEN 325 MG PO TABS
ORAL_TABLET | ORAL | Status: AC
  Filled 2021-07-07: qty 2

## 2021-07-07 MED ORDER — DIPHENHYDRAMINE HCL 25 MG PO CAPS
ORAL_CAPSULE | ORAL | Status: AC
  Filled 2021-07-07: qty 1

## 2021-07-09 ENCOUNTER — Telehealth: Payer: Self-pay | Admitting: *Deleted

## 2021-07-09 NOTE — Telephone Encounter (Signed)
Patients wife called requesting refill of compazine and if possible with additional refills routed to [facility] on file.  Routed to MD to advise.

## 2021-07-10 ENCOUNTER — Other Ambulatory Visit: Payer: Self-pay | Admitting: *Deleted

## 2021-07-10 MED ORDER — PROCHLORPERAZINE MALEATE 10 MG PO TABS: ORAL_TABLET | ORAL | 3 refills | Status: DC

## 2021-07-13 ENCOUNTER — Other Ambulatory Visit: Payer: BC Managed Care – PPO

## 2021-07-13 ENCOUNTER — Ambulatory Visit: Payer: BC Managed Care – PPO | Admitting: Oncology

## 2021-07-13 ENCOUNTER — Ambulatory Visit: Payer: BC Managed Care – PPO

## 2021-07-19 ENCOUNTER — Telehealth: Payer: Self-pay | Admitting: Oncology

## 2021-07-19 MED FILL — Dexamethasone Sodium Phosphate Inj 100 MG/10ML: INTRAMUSCULAR | Qty: 1 | Status: AC

## 2021-07-19 MED FILL — Fosaprepitant Dimeglumine For IV Infusion 150 MG (Base Eq): INTRAVENOUS | Qty: 5 | Status: AC

## 2021-07-19 NOTE — Telephone Encounter (Signed)
Sch per 8/12 los, left message

## 2021-07-20 ENCOUNTER — Other Ambulatory Visit: Payer: BC Managed Care – PPO

## 2021-07-20 ENCOUNTER — Inpatient Hospital Stay: Payer: BC Managed Care – PPO | Attending: Oncology

## 2021-07-20 ENCOUNTER — Inpatient Hospital Stay (HOSPITAL_BASED_OUTPATIENT_CLINIC_OR_DEPARTMENT_OTHER): Payer: BC Managed Care – PPO | Admitting: Oncology

## 2021-07-20 ENCOUNTER — Ambulatory Visit: Payer: BC Managed Care – PPO

## 2021-07-20 ENCOUNTER — Other Ambulatory Visit: Payer: Self-pay

## 2021-07-20 ENCOUNTER — Inpatient Hospital Stay: Payer: BC Managed Care – PPO

## 2021-07-20 VITALS — BP 125/80 | HR 75 | Temp 98.1°F | Resp 18 | Ht 71.0 in | Wt 214.9 lb

## 2021-07-20 DIAGNOSIS — C670 Malignant neoplasm of trigone of bladder: Secondary | ICD-10-CM | POA: Insufficient documentation

## 2021-07-20 DIAGNOSIS — C775 Secondary and unspecified malignant neoplasm of intrapelvic lymph nodes: Secondary | ICD-10-CM | POA: Diagnosis not present

## 2021-07-20 DIAGNOSIS — Z95828 Presence of other vascular implants and grafts: Secondary | ICD-10-CM

## 2021-07-20 DIAGNOSIS — Z79899 Other long term (current) drug therapy: Secondary | ICD-10-CM | POA: Insufficient documentation

## 2021-07-20 DIAGNOSIS — Z5111 Encounter for antineoplastic chemotherapy: Secondary | ICD-10-CM | POA: Diagnosis present

## 2021-07-20 DIAGNOSIS — C679 Malignant neoplasm of bladder, unspecified: Secondary | ICD-10-CM

## 2021-07-20 LAB — CBC WITH DIFFERENTIAL (CANCER CENTER ONLY)
Abs Immature Granulocytes: 0.09 10*3/uL — ABNORMAL HIGH (ref 0.00–0.07)
Basophils Absolute: 0.1 10*3/uL (ref 0.0–0.1)
Basophils Relative: 1 %
Eosinophils Absolute: 0.3 10*3/uL (ref 0.0–0.5)
Eosinophils Relative: 4 %
HCT: 32.1 % — ABNORMAL LOW (ref 39.0–52.0)
Hemoglobin: 10.6 g/dL — ABNORMAL LOW (ref 13.0–17.0)
Immature Granulocytes: 1 %
Lymphocytes Relative: 21 %
Lymphs Abs: 1.5 10*3/uL (ref 0.7–4.0)
MCH: 29.7 pg (ref 26.0–34.0)
MCHC: 33 g/dL (ref 30.0–36.0)
MCV: 89.9 fL (ref 80.0–100.0)
Monocytes Absolute: 1 10*3/uL (ref 0.1–1.0)
Monocytes Relative: 14 %
Neutro Abs: 4.2 10*3/uL (ref 1.7–7.7)
Neutrophils Relative %: 59 %
Platelet Count: 326 10*3/uL (ref 150–400)
RBC: 3.57 MIL/uL — ABNORMAL LOW (ref 4.22–5.81)
RDW: 15.6 % — ABNORMAL HIGH (ref 11.5–15.5)
WBC Count: 7.1 10*3/uL (ref 4.0–10.5)
nRBC: 0 % (ref 0.0–0.2)

## 2021-07-20 LAB — CMP (CANCER CENTER ONLY)
ALT: 8 U/L (ref 0–44)
AST: 15 U/L (ref 15–41)
Albumin: 3.1 g/dL — ABNORMAL LOW (ref 3.5–5.0)
Alkaline Phosphatase: 85 U/L (ref 38–126)
Anion gap: 10 (ref 5–15)
BUN: 18 mg/dL (ref 6–20)
CO2: 22 mmol/L (ref 22–32)
Calcium: 9 mg/dL (ref 8.9–10.3)
Chloride: 108 mmol/L (ref 98–111)
Creatinine: 1.17 mg/dL (ref 0.61–1.24)
GFR, Estimated: >60 mL/min (ref >60)
Glucose, Bld: 100 mg/dL — ABNORMAL HIGH (ref 70–99)
Potassium: 3.9 mmol/L (ref 3.5–5.1)
Sodium: 140 mmol/L (ref 135–145)
Total Bilirubin: 0.2 mg/dL — ABNORMAL LOW (ref 0.3–1.2)
Total Protein: 7 g/dL (ref 6.5–8.1)

## 2021-07-20 MED ORDER — HEPARIN SOD (PORK) LOCK FLUSH 100 UNIT/ML IV SOLN
500.0000 [IU] | Freq: Once | INTRAVENOUS | Status: AC | PRN
  Administered 2021-07-20: 500 [IU]

## 2021-07-20 MED ORDER — SODIUM CHLORIDE 0.9% FLUSH
10.0000 mL | INTRAVENOUS | Status: DC | PRN
  Administered 2021-07-20: 10 mL

## 2021-07-20 MED ORDER — SODIUM CHLORIDE 0.9 % IV SOLN: Freq: Once | INTRAVENOUS | Status: AC

## 2021-07-20 MED ORDER — SODIUM CHLORIDE 0.9 % IV SOLN
1000.0000 mg/m2 | Freq: Once | INTRAVENOUS | Status: AC
  Administered 2021-07-20: 2204 mg via INTRAVENOUS
  Filled 2021-07-20: qty 57.97

## 2021-07-20 MED ORDER — SODIUM CHLORIDE 0.9 % IV SOLN
70.0000 mg/m2 | Freq: Once | INTRAVENOUS | Status: AC
  Administered 2021-07-20: 155 mg via INTRAVENOUS
  Filled 2021-07-20: qty 155

## 2021-07-20 MED ORDER — PALONOSETRON HCL INJECTION 0.25 MG/5ML
0.2500 mg | Freq: Once | INTRAVENOUS | Status: AC
  Administered 2021-07-20: 0.25 mg via INTRAVENOUS
  Filled 2021-07-20: qty 5

## 2021-07-20 MED ORDER — MAGNESIUM SULFATE 2 GM/50ML IV SOLN
2.0000 g | Freq: Once | INTRAVENOUS | Status: AC
  Administered 2021-07-20: 2 g via INTRAVENOUS
  Filled 2021-07-20: qty 50

## 2021-07-20 MED ORDER — SODIUM CHLORIDE 0.9% FLUSH
10.0000 mL | Freq: Once | INTRAVENOUS | Status: AC
  Administered 2021-07-20: 10 mL via INTRAVENOUS

## 2021-07-20 MED ORDER — SODIUM CHLORIDE 0.9 % IV SOLN
150.0000 mg | Freq: Once | INTRAVENOUS | Status: AC
  Administered 2021-07-20: 150 mg via INTRAVENOUS
  Filled 2021-07-20: qty 150

## 2021-07-20 MED ORDER — POTASSIUM CHLORIDE IN NACL 20-0.9 MEQ/L-% IV SOLN
Freq: Once | INTRAVENOUS | Status: AC
  Filled 2021-07-20: qty 1000

## 2021-07-20 MED ORDER — SODIUM CHLORIDE 0.9 % IV SOLN
10.0000 mg | Freq: Once | INTRAVENOUS | Status: AC
  Administered 2021-07-20: 10 mg via INTRAVENOUS
  Filled 2021-07-20: qty 10

## 2021-07-20 NOTE — Progress Notes (Signed)
Hematology and Oncology Follow Up Visit  Barry Gibson [id number] 04/24/1961 60 y.o. 07/20/2021 8:11 AM Barry Gibson, DOTaylor, Barry M, DO   Principle Diagnosis: 60 year old man with bladder cancer diagnosed in January 2022.  He was found to have stage IV T4N2 high-grade urothelial carcinoma after cystoprostatectomy and lymphadenectomy with pelvic lymphadenopathy.   Prior Therapy:   He underwent robotic assisted laparoscopic radical cystectomy and lymphadenectomy January 2022.  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  He had 4 out of 7 lymph nodes involved with the final pathological staging is T4N2 disease.     He underwent staging scans on April 19, 2021 which showed interval progression of abdominal pelvic adenopathy consistent with worsening nodal metastasis.   Current therapy:  Chemotherapy utilizing gemcitabine and cisplatin started on June 08, 2021 receiving day 1 cisplatin and gemcitabine with day 8 gemcitabine alone.  He is here for day 1 of cycle 3 of therapy  Interim History: [name] is here for repeat evaluation.  Since last visit, he reports no major changes in his health.  He continues to tolerate chemotherapy without any major complaints.  He denies any nausea, vomiting or abdominal pain.  He denies any worsening neuropathy.  He denies excessive fatigue.  He denies any hematuria or dysuria.     Medications: Updated on review. Current Outpatient Medications  Medication Sig Dispense Refill   acetaminophen (TYLENOL) 650 MG CR tablet Take 1,300 mg by mouth every morning.     amLODipine (NORVASC) 10 MG tablet Take 1 tablet (10 mg total) by mouth daily. 90 tablet 1   lidocaine-prilocaine (EMLA) cream Apply 1 application topically as needed. 30 g 0   omeprazole (PRILOSEC) 40 MG capsule Take 40 mg by mouth daily.     pravastatin (PRAVACHOL)  40 MG tablet TAKE 1 TABLET(40 MG) BY MOUTH DAILY 90 tablet 1   prednisoLONE acetate (PRED FORTE) 1 % ophthalmic suspension Place 1 drop into the left eye 2 (two) times a week. Instill 1 drop into left eye Monday and Friday.     prochlorperazine (COMPAZINE) 10 MG tablet TAKE 1 TABLET(10 MG) BY MOUTH EVERY 6 HOURS AS NEEDED FOR NAUSEA OR VOMITING 30 tablet 3   sertraline (ZOLOFT) 50 MG tablet Take 1 tablet (50 mg total) by mouth daily. 90 tablet 1   No current facility-administered medications for this visit.   Facility-Administered Medications Ordered in Other Visits  Medication Dose Route Frequency Provider Last Rate Last Admin   acetaminophen (TYLENOL) 325 MG tablet            diphenhydrAMINE (BENADRYL) 25 mg capsule            gemcitabine (GEMZAR) chemo syringe for bladder instillation 2,000 mg  2,000 mg Bladder Instillation Once Franchot Gallo, MD       gemcitabine [facility]) chemo syringe for bladder instillation 2,000 mg  2,000 mg Bladder Instillation Once Franchot Gallo, MD         Allergies:  Allergies  Allergen Reactions   Ace Inhibitors Cough   Diflucan [Fluconazole] Other (See Comments)    Irritated ulcers      Physical Exam: Blood pressure 125/80, pulse 75, temperature 98.1 F (36.7 C), temperature source Oral, resp. rate 18, height '5\' 11"'$  (1.803 Gibson), weight 214 lb 14.4 oz (97.5 kg), SpO2 99 %.   ECOG: 0    General appearance: Alert, awake without any distress.  Head: Atraumatic without abnormalities Oropharynx: Without any thrush or ulcers. Eyes: No scleral icterus. Lymph nodes: No lymphadenopathy noted in the cervical, supraclavicular, or axillary nodes Heart:regular rate and rhythm, without any murmurs or gallops.   Lung: Clear to auscultation without any rhonchi, wheezes or dullness to percussion. Abdomin: Soft, nontender without any shifting dullness or ascites. Musculoskeletal: No clubbing or cyanosis. Neurological: No motor or sensory deficits. Skin:  No rashes or lesions. Psychiatric: Mood and affect appeared normal.      Lab Results: Lab Results  Component Value Date   WBC 6.9 07/06/2021   HGB 11.8 (L) 07/06/2021   HCT 35.1 (L) 07/06/2021   MCV 88.2 07/06/2021   PLT 355 07/06/2021     Chemistry      Component Value Date/Time   NA 141 07/06/2021 1419   NA 136 10/06/2020 1011   K 4.0 07/06/2021 1419   CL 108 07/06/2021 1419   CO2 24 07/06/2021 1419   BUN 20 07/06/2021 1419   BUN 20 10/06/2020 1011   CREATININE 1.18 07/06/2021 1419   CREATININE 1.05 11/25/2014 0826      Component Value Date/Time   CALCIUM 8.8 (L) 07/06/2021 1419   ALKPHOS 82 07/06/2021 1419   AST 20 07/06/2021 1419   ALT 18 07/06/2021 1419   BILITOT <0.2 (L) 07/06/2021 1419         Impression and Plan:  60 year old man with:  1.  Bladder cancer diagnosed in January 2022.  He developed stage IV high-grade urothelial carcinoma with recurrent pelvic disease June 2022.   Risks and benefits of continuing systemic chemotherapy were reviewed at this time.  Complications that include nausea, vomiting, mild suppression, neutropenia, neuropathy and renal dysfunction as well as electrolyte imbalance.  The plan is to complete 4 cycles of therapy and update his staging scans.  Based on these results switching to maintenance immunotherapy would be recommended pending his response.  Laboratory data personally reviewed today and showed adequate hematological parameters and the plan is to proceed without any dose reduction or delay.     2.  IV access: Port-A-Cath currently in use without any issues.   3.  Antiemetics: No nausea or vomiting reported Compazine is available to him.   4.  Renal function surveillance: Kidney function remains within normal range on platinum based therapy.  We will continue to monitor.   5.  Goals of care: Therapy remains palliative although aggressive measures are warranted given his excellent performance status.   6.   Follow-up: He will return in 1 week to complete cycle 3 and 3 weeks from the start of cycle 4.   30  minutes were dedicated to this encounter.  The time was spent on reviewing laboratory data, disease status update, reviewing treatment choices and future plan of care discussion.   Zola Button, MD 9/2/20228:11 AM

## 2021-07-20 NOTE — Patient Instructions (Signed)
[facility]  Discharge Instructions: Thank you for choosing [facility] to provide your oncology and hematology care.   If you have a lab appointment with the [facility], please go directly to the [facility] and check in at the registration area.   Wear comfortable clothing and clothing appropriate for easy access to any Portacath or PICC line.   We strive to give you quality time with your provider. You may need to reschedule your appointment if you arrive late (15 or more minutes).  Arriving late affects you and other patients whose appointments are after yours.  Also, if you miss three or more appointments without notifying the office, you may be dismissed from the clinic at the provider's discretion.      For prescription refill requests, have your pharmacy contact our office and allow 72 hours for refills to be completed.    Today you received the following chemotherapy and/or immunotherapy agents Gemzar & Cisplatin     To help prevent nausea and vomiting after your treatment, we encourage you to take your nausea medication as directed.  BELOW ARE SYMPTOMS THAT SHOULD BE REPORTED IMMEDIATELY: *FEVER GREATER THAN 100.4 F (38 C) OR HIGHER *CHILLS OR SWEATING *NAUSEA AND VOMITING THAT IS NOT CONTROLLED WITH YOUR NAUSEA MEDICATION *UNUSUAL SHORTNESS OF BREATH *UNUSUAL BRUISING OR BLEEDING *URINARY PROBLEMS (pain or burning when urinating, or frequent urination) *BOWEL PROBLEMS (unusual diarrhea, constipation, pain near the anus) TENDERNESS IN MOUTH AND THROAT WITH OR WITHOUT PRESENCE OF ULCERS (sore throat, sores in mouth, or a toothache) UNUSUAL RASH, SWELLING OR PAIN  UNUSUAL VAGINAL DISCHARGE OR ITCHING   Items with * indicate a potential emergency and should be followed up as soon as possible or go to the Emergency Department if any problems should occur.  Please show the CHEMOTHERAPY ALERT CARD or IMMUNOTHERAPY ALERT CARD at  check-in to the Emergency Department and triage nurse.  Should you have questions after your visit or need to cancel or reschedule your appointment, please contact [facility]  Dept: [phone]  and follow the prompts.  Office hours are 8:00 a.m. to 4:30 p.m. Monday - Friday. Please note that voicemails left after 4:00 p.m. may not be returned until the following business day.  We are closed weekends and major holidays. You have access to a nurse at all times for urgent questions. Please call the main number to the clinic Dept: [phone] and follow the prompts.   For any non-urgent questions, you may also contact your provider using MyChart. We now offer e-Visits for anyone 48 and older to request care online for non-urgent symptoms. For details visit [email or web address].   Also download the MyChart app! Go to the app store, search "MyChart", open the app, select [facility], and log in with your MyChart username and password.  Due to Covid, a mask is required upon entering the hospital/clinic. If you do not have a mask, one will be given to you upon arrival. For doctor visits, patients may have 1 support person aged 19 or older with them. For treatment visits, patients cannot have anyone with them due to current Covid guidelines and our immunocompromised population.   Gemcitabine injection What is this medication? GEMCITABINE (jem SYE ta been) is a chemotherapy drug. This medicine is used to treat many types of cancer like breast cancer, lung cancer, pancreatic cancer, and ovarian cancer. This medicine may be used for other purposes; ask your health care provider  or pharmacist if you have questions. COMMON BRAND NAME(S): Gemzar, Infugem What should I tell my care team before I take this medication? They need to know if you have any of these conditions: blood disorders infection kidney disease liver disease lung or breathing disease, like asthma recent or  ongoing radiation therapy an unusual or allergic reaction to gemcitabine, other chemotherapy, other medicines, foods, dyes, or preservatives pregnant or trying to get pregnant breast-feeding How should I use this medication? This drug is given as an infusion into a vein. It is administered in a hospital or clinic by a specially trained health care professional. Talk to your pediatrician regarding the use of this medicine in children. Special care may be needed. Overdosage: If you think you have taken too much of this medicine contact a poison control center or emergency room at once. NOTE: This medicine is only for you. Do not share this medicine with others. What if I miss a dose? It is important not to miss your dose. Call your doctor or health care professional if you are unable to keep an appointment. What may interact with this medication? medicines to increase blood counts like filgrastim, pegfilgrastim, sargramostim some other chemotherapy drugs like cisplatin vaccines Talk to your doctor or health care professional before taking any of these medicines: acetaminophen aspirin ibuprofen ketoprofen naproxen This list may not describe all possible interactions. Give your health care provider a list of all the medicines, herbs, non-prescription drugs, or dietary supplements you use. Also tell them if you smoke, drink alcohol, or use illegal drugs. Some items may interact with your medicine. What should I watch for while using this medication? Visit your doctor for checks on your progress. This drug may make you feel generally unwell. This is not uncommon, as chemotherapy can affect healthy cells as well as cancer cells. Report any side effects. Continue your course of treatment even though you feel ill unless your doctor tells you to stop. In some cases, you may be given additional medicines to help with side effects. Follow all directions for their use. Call your doctor or health care  professional for advice if you get a fever, chills or sore throat, or other symptoms of a cold or flu. Do not treat yourself. This drug decreases your body's ability to fight infections. Try to avoid being around people who are sick. This medicine may increase your risk to bruise or bleed. Call your doctor or health care professional if you notice any unusual bleeding. Be careful brushing and flossing your teeth or using a toothpick because you may get an infection or bleed more easily. If you have any dental work done, tell your dentist you are receiving this medicine. Avoid taking products that contain aspirin, acetaminophen, ibuprofen, naproxen, or ketoprofen unless instructed by your doctor. These medicines may hide a fever. Do not become pregnant while taking this medicine or for 6 months after stopping it. Women should inform their doctor if they wish to become pregnant or think they might be pregnant. Men should not father a child while taking this medicine and for 3 months after stopping it. There is a potential for serious side effects to an unborn child. Talk to your health care professional or pharmacist for more information. Do not breast-feed an infant while taking this medicine or for at least 1 week after stopping it. Men should inform their doctors if they wish to father a child. This medicine may lower sperm counts. Talk with your doctor or health  care professional if you are concerned about your fertility. What side effects may I notice from receiving this medication? Side effects that you should report to your doctor or health care professional as soon as possible: allergic reactions like skin rash, itching or hives, swelling of the face, lips, or tongue breathing problems pain, redness, or irritation at site where injected signs and symptoms of a dangerous change in heartbeat or heart rhythm like chest pain; dizziness; fast or irregular heartbeat; palpitations; feeling faint or  lightheaded, falls; breathing problems signs of decreased platelets or bleeding - bruising, pinpoint red spots on the skin, black, tarry stools, blood in the urine signs of decreased red blood cells - unusually weak or tired, feeling faint or lightheaded, falls signs of infection - fever or chills, cough, sore throat, pain or difficulty passing urine signs and symptoms of kidney injury like trouble passing urine or change in the amount of urine signs and symptoms of liver injury like dark yellow or brown urine; general ill feeling or flu-like symptoms; light-colored stools; loss of appetite; nausea; right upper belly pain; unusually weak or tired; yellowing of the eyes or skin swelling of ankles, feet, hands Side effects that usually do not require medical attention (report to your doctor or health care professional if they continue or are bothersome): constipation diarrhea hair loss loss of appetite nausea rash vomiting This list may not describe all possible side effects. Call your doctor for medical advice about side effects. You may report side effects to FDA at 1-800-FDA-1088. Where should I keep my medication? This drug is given in a hospital or clinic and will not be stored at home. NOTE: This sheet is a summary. It may not cover all possible information. If you have questions about this medicine, talk to your doctor, pharmacist, or health care provider.  2022 Elsevier/Gold Standard (2018-01-28 18:06:11)  Cisplatin injection What is this medication? CISPLATIN (SIS pla tin) is a chemotherapy drug. It targets fast dividing cells, like cancer cells, and causes these cells to die. This medicine is used to treat many types of cancer like bladder, ovarian, and testicular cancers. This medicine may be used for other purposes; ask your health care provider or pharmacist if you have questions. COMMON BRAND NAME(S): Platinol, Platinol -AQ What should I tell my care team before I take this  medication? They need to know if you have any of these conditions: eye disease, vision problems hearing problems kidney disease low blood counts, like white cells, platelets, or red blood cells tingling of the fingers or toes, or other nerve disorder an unusual or allergic reaction to cisplatin, carboplatin, oxaliplatin, other medicines, foods, dyes, or preservatives pregnant or trying to get pregnant breast-feeding How should I use this medication? This drug is given as an infusion into a vein. It is administered in a hospital or clinic by a specially trained health care professional. Talk to your pediatrician regarding the use of this medicine in children. Special care may be needed. Overdosage: If you think you have taken too much of this medicine contact a poison control center or emergency room at once. NOTE: This medicine is only for you. Do not share this medicine with others. What if I miss a dose? It is important not to miss a dose. Call your doctor or health care professional if you are unable to keep an appointment. What may interact with this medication? This medicine may interact with the following medications: foscarnet certain antibiotics like amikacin, gentamicin, neomycin, polymyxin B,  streptomycin, tobramycin, vancomycin This list may not describe all possible interactions. Give your health care provider a list of all the medicines, herbs, non-prescription drugs, or dietary supplements you use. Also tell them if you smoke, drink alcohol, or use illegal drugs. Some items may interact with your medicine. What should I watch for while using this medication? Your condition will be monitored carefully while you are receiving this medicine. You will need important blood work done while you are taking this medicine. This drug may make you feel generally unwell. This is not uncommon, as chemotherapy can affect healthy cells as well as cancer cells. Report any side effects. Continue  your course of treatment even though you feel ill unless your doctor tells you to stop. This medicine may increase your risk of getting an infection. Call your healthcare professional for advice if you get a fever, chills, or sore throat, or other symptoms of a cold or flu. Do not treat yourself. Try to avoid being around people who are sick. Avoid taking medicines that contain aspirin, acetaminophen, ibuprofen, naproxen, or ketoprofen unless instructed by your healthcare professional. These medicines may hide a fever. This medicine may increase your risk to bruise or bleed. Call your doctor or health care professional if you notice any unusual bleeding. Be careful brushing and flossing your teeth or using a toothpick because you may get an infection or bleed more easily. If you have any dental work done, tell your dentist you are receiving this medicine. Do not become pregnant while taking this medicine or for 14 months after stopping it. Women should inform their healthcare professional if they wish to become pregnant or think they might be pregnant. Men should not father a child while taking this medicine and for 11 months after stopping it. There is potential for serious side effects to an unborn child. Talk to your healthcare professional for more information. Do not breast-feed an infant while taking this medicine. This medicine has caused ovarian failure in some women. This medicine may make it more difficult to get pregnant. Talk to your healthcare professional if you are concerned about your fertility. This medicine has caused decreased sperm counts in some men. This may make it more difficult to father a child. Talk to your healthcare professional if you are concerned about your fertility. Drink fluids as directed while you are taking this medicine. This will help protect your kidneys. Call your doctor or health care professional if you get diarrhea. Do not treat yourself. What side effects may  I notice from receiving this medication? Side effects that you should report to your doctor or health care professional as soon as possible: allergic reactions like skin rash, itching or hives, swelling of the face, lips, or tongue blurred vision changes in vision decreased hearing or ringing of the ears nausea, vomiting pain, redness, or irritation at site where injected pain, tingling, numbness in the hands or feet signs and symptoms of bleeding such as bloody or black, tarry stools; red or dark brown urine; spitting up blood or brown material that looks like coffee grounds; red spots on the skin; unusual bruising or bleeding from the eyes, gums, or nose signs and symptoms of infection like fever; chills; cough; sore throat; pain or trouble passing urine signs and symptoms of kidney injury like trouble passing urine or change in the amount of urine signs and symptoms of low red blood cells or anemia such as unusually weak or tired; feeling faint or lightheaded; falls; breathing problems  Side effects that usually do not require medical attention (report to your doctor or health care professional if they continue or are bothersome): loss of appetite mouth sores muscle cramps This list may not describe all possible side effects. Call your doctor for medical advice about side effects. You may report side effects to FDA at 1-800-FDA-1088. Where should I keep my medication? This drug is given in a hospital or clinic and will not be stored at home. NOTE: This sheet is a summary. It may not cover all possible information. If you have questions about this medicine, talk to your doctor, pharmacist, or health care provider.  2022 Elsevier/Gold Standard (2018-10-30 15:59:17)

## 2021-07-24 ENCOUNTER — Other Ambulatory Visit: Payer: Self-pay | Admitting: *Deleted

## 2021-07-24 ENCOUNTER — Telehealth: Payer: Self-pay | Admitting: Oncology

## 2021-07-24 MED ORDER — PROCHLORPERAZINE MALEATE 10 MG PO TABS
ORAL_TABLET | ORAL | 2 refills | Status: DC
Start: 2021-07-24 — End: 2022-07-30

## 2021-07-24 NOTE — Telephone Encounter (Signed)
Scheduled follow-up appointment per 9/2 los. Patient's wife is aware.

## 2021-07-27 ENCOUNTER — Other Ambulatory Visit: Payer: Self-pay

## 2021-07-27 ENCOUNTER — Inpatient Hospital Stay: Payer: BC Managed Care – PPO

## 2021-07-27 VITALS — BP 146/91 | HR 70 | Temp 97.7°F | Resp 16 | Wt 213.0 lb

## 2021-07-27 DIAGNOSIS — C670 Malignant neoplasm of trigone of bladder: Secondary | ICD-10-CM | POA: Diagnosis not present

## 2021-07-27 DIAGNOSIS — Z95828 Presence of other vascular implants and grafts: Secondary | ICD-10-CM | POA: Insufficient documentation

## 2021-07-27 DIAGNOSIS — C679 Malignant neoplasm of bladder, unspecified: Secondary | ICD-10-CM

## 2021-07-27 LAB — CBC WITH DIFFERENTIAL (CANCER CENTER ONLY)
Abs Immature Granulocytes: 0.13 10*3/uL — ABNORMAL HIGH (ref 0.00–0.07)
Basophils Absolute: 0.1 10*3/uL (ref 0.0–0.1)
Basophils Relative: 2 %
Eosinophils Absolute: 0.1 10*3/uL (ref 0.0–0.5)
Eosinophils Relative: 1 %
HCT: 30.2 % — ABNORMAL LOW (ref 39.0–52.0)
Hemoglobin: 10.3 g/dL — ABNORMAL LOW (ref 13.0–17.0)
Immature Granulocytes: 2 %
Lymphocytes Relative: 22 %
Lymphs Abs: 1.4 10*3/uL (ref 0.7–4.0)
MCH: 30.2 pg (ref 26.0–34.0)
MCHC: 34.1 g/dL (ref 30.0–36.0)
MCV: 88.6 fL (ref 80.0–100.0)
Monocytes Absolute: 0.6 10*3/uL (ref 0.1–1.0)
Monocytes Relative: 8 %
Neutro Abs: 4.3 10*3/uL (ref 1.7–7.7)
Neutrophils Relative %: 65 %
Platelet Count: 290 10*3/uL (ref 150–400)
RBC: 3.41 MIL/uL — ABNORMAL LOW (ref 4.22–5.81)
RDW: 15.5 % (ref 11.5–15.5)
WBC Count: 6.6 10*3/uL (ref 4.0–10.5)
nRBC: 0 % (ref 0.0–0.2)

## 2021-07-27 LAB — CMP (CANCER CENTER ONLY)
ALT: 11 U/L (ref 0–44)
AST: 15 U/L (ref 15–41)
Albumin: 3.2 g/dL — ABNORMAL LOW (ref 3.5–5.0)
Alkaline Phosphatase: 85 U/L (ref 38–126)
Anion gap: 10 (ref 5–15)
BUN: 20 mg/dL (ref 6–20)
CO2: 23 mmol/L (ref 22–32)
Calcium: 9 mg/dL (ref 8.9–10.3)
Chloride: 108 mmol/L (ref 98–111)
Creatinine: 1.24 mg/dL (ref 0.61–1.24)
GFR, Estimated: >60 mL/min (ref >60)
Glucose, Bld: 96 mg/dL (ref 70–99)
Potassium: 4.2 mmol/L (ref 3.5–5.1)
Sodium: 141 mmol/L (ref 135–145)
Total Bilirubin: <0.2 mg/dL — ABNORMAL LOW (ref 0.3–1.2)
Total Protein: 7.1 g/dL (ref 6.5–8.1)

## 2021-07-27 MED ORDER — SODIUM CHLORIDE 0.9% FLUSH
10.0000 mL | Freq: Once | INTRAVENOUS | Status: AC
Start: 2021-07-27 — End: 2021-07-27
  Administered 2021-07-27: 10 mL

## 2021-07-27 MED ORDER — HEPARIN SOD (PORK) LOCK FLUSH 100 UNIT/ML IV SOLN
500.0000 [IU] | Freq: Once | INTRAVENOUS | Status: AC | PRN
  Administered 2021-07-27: 500 [IU]

## 2021-07-27 MED ORDER — PROCHLORPERAZINE MALEATE 10 MG PO TABS
10.0000 mg | ORAL_TABLET | Freq: Once | ORAL | Status: AC
  Administered 2021-07-27: 10 mg via ORAL
  Filled 2021-07-27: qty 1

## 2021-07-27 MED ORDER — SODIUM CHLORIDE 0.9% FLUSH
10.0000 mL | INTRAVENOUS | Status: DC | PRN
  Administered 2021-07-27: 10 mL

## 2021-07-27 MED ORDER — SODIUM CHLORIDE 0.9 % IV SOLN
1000.0000 mg/m2 | Freq: Once | INTRAVENOUS | Status: AC
  Administered 2021-07-27: 2204 mg via INTRAVENOUS
  Filled 2021-07-27: qty 57.97

## 2021-07-27 MED ORDER — SODIUM CHLORIDE 0.9 % IV SOLN: Freq: Once | INTRAVENOUS | Status: AC

## 2021-07-27 NOTE — Patient Instructions (Signed)
[facility]  Discharge Instructions: Thank you for choosing [facility] to provide your oncology and hematology care.   If you have a lab appointment with the Cancer Center, please go directly to the Cancer Center and check in at the registration area.   Wear comfortable clothing and clothing appropriate for easy access to any Portacath or PICC line.   We strive to give you quality time with your provider. You may need to reschedule your appointment if you arrive late (15 or more minutes).  Arriving late affects you and other patients whose appointments are after yours.  Also, if you miss three or more appointments without notifying the office, you may be dismissed from the clinic at the provider's discretion.      For prescription refill requests, have your pharmacy contact our office and allow 72 hours for refills to be completed.    Today you received the following chemotherapy and/or immunotherapy agents Gemzar      To help prevent nausea and vomiting after your treatment, we encourage you to take your nausea medication as directed.  BELOW ARE SYMPTOMS THAT SHOULD BE REPORTED IMMEDIATELY: *FEVER GREATER THAN 100.4 F (38 C) OR HIGHER *CHILLS OR SWEATING *NAUSEA AND VOMITING THAT IS NOT CONTROLLED WITH YOUR NAUSEA MEDICATION *UNUSUAL SHORTNESS OF BREATH *UNUSUAL BRUISING OR BLEEDING *URINARY PROBLEMS (pain or burning when urinating, or frequent urination) *BOWEL PROBLEMS (unusual diarrhea, constipation, pain near the anus) TENDERNESS IN MOUTH AND THROAT WITH OR WITHOUT PRESENCE OF ULCERS (sore throat, sores in mouth, or a toothache) UNUSUAL RASH, SWELLING OR PAIN  UNUSUAL VAGINAL DISCHARGE OR ITCHING   Items with * indicate a potential emergency and should be followed up as soon as possible or go to the Emergency Department if any problems should occur.  Please show the CHEMOTHERAPY ALERT CARD or IMMUNOTHERAPY ALERT CARD at check-in to the  Emergency Department and triage nurse.  Should you have questions after your visit or need to cancel or reschedule your appointment, please contact [facility]  Dept: [phone]  and follow the prompts.  Office hours are 8:00 a.m. to 4:30 p.m. Monday - Friday. Please note that voicemails left after 4:00 p.m. may not be returned until the following business day.  We are closed weekends and major holidays. You have access to a nurse at all times for urgent questions. Please call the main number to the clinic Dept: [phone] and follow the prompts.   For any non-urgent questions, you may also contact your provider using MyChart. We now offer e-Visits for anyone 18 and older to request care online for non-urgent symptoms. For details visit [email or web address].   Also download the MyChart app! Go to the app store, search "MyChart", open the app, select [facility], and log in with your MyChart username and password.  Due to Covid, a mask is required upon entering the hospital/clinic. If you do not have a mask, one will be given to you upon arrival. For doctor visits, patients may have 1 support person aged 18 or older with them. For treatment visits, patients cannot have anyone with them due to current Covid guidelines and our immunocompromised population.   

## 2021-07-31 ENCOUNTER — Other Ambulatory Visit: Payer: Self-pay | Admitting: Oncology

## 2021-07-31 DIAGNOSIS — C679 Malignant neoplasm of bladder, unspecified: Secondary | ICD-10-CM

## 2021-08-04 ENCOUNTER — Other Ambulatory Visit: Payer: Self-pay | Admitting: Family Medicine

## 2021-08-04 DIAGNOSIS — F419 Anxiety disorder, unspecified: Secondary | ICD-10-CM

## 2021-08-04 DIAGNOSIS — F32A Depression, unspecified: Secondary | ICD-10-CM

## 2021-08-09 MED FILL — Dexamethasone Sodium Phosphate Inj 100 MG/10ML: INTRAMUSCULAR | Qty: 1 | Status: AC

## 2021-08-09 MED FILL — Fosaprepitant Dimeglumine For IV Infusion 150 MG (Base Eq): INTRAVENOUS | Qty: 5 | Status: AC

## 2021-08-10 ENCOUNTER — Inpatient Hospital Stay: Payer: BC Managed Care – PPO

## 2021-08-10 ENCOUNTER — Other Ambulatory Visit: Payer: Self-pay

## 2021-08-10 ENCOUNTER — Inpatient Hospital Stay (HOSPITAL_BASED_OUTPATIENT_CLINIC_OR_DEPARTMENT_OTHER): Payer: BC Managed Care – PPO | Admitting: Oncology

## 2021-08-10 VITALS — BP 124/80 | HR 75 | Temp 96.5°F | Resp 17 | Ht 71.0 in | Wt 211.2 lb

## 2021-08-10 DIAGNOSIS — C670 Malignant neoplasm of trigone of bladder: Secondary | ICD-10-CM

## 2021-08-10 DIAGNOSIS — Z95828 Presence of other vascular implants and grafts: Secondary | ICD-10-CM

## 2021-08-10 DIAGNOSIS — C679 Malignant neoplasm of bladder, unspecified: Secondary | ICD-10-CM

## 2021-08-10 LAB — CMP (CANCER CENTER ONLY)
ALT: 7 U/L (ref 0–44)
AST: 11 U/L — ABNORMAL LOW (ref 15–41)
Albumin: 3 g/dL — ABNORMAL LOW (ref 3.5–5.0)
Alkaline Phosphatase: 79 U/L (ref 38–126)
Anion gap: 9 (ref 5–15)
BUN: 17 mg/dL (ref 6–20)
CO2: 23 mmol/L (ref 22–32)
Calcium: 9.2 mg/dL (ref 8.9–10.3)
Chloride: 109 mmol/L (ref 98–111)
Creatinine: 1.21 mg/dL (ref 0.61–1.24)
GFR, Estimated: >60 mL/min (ref >60)
Glucose, Bld: 122 mg/dL — ABNORMAL HIGH (ref 70–99)
Potassium: 3.7 mmol/L (ref 3.5–5.1)
Sodium: 141 mmol/L (ref 135–145)
Total Bilirubin: 0.2 mg/dL — ABNORMAL LOW (ref 0.3–1.2)
Total Protein: 6.8 g/dL (ref 6.5–8.1)

## 2021-08-10 LAB — CBC WITH DIFFERENTIAL (CANCER CENTER ONLY)
Abs Immature Granulocytes: 0.18 10*3/uL — ABNORMAL HIGH (ref 0.00–0.07)
Basophils Absolute: 0.1 10*3/uL (ref 0.0–0.1)
Basophils Relative: 1 %
Eosinophils Absolute: 0.3 10*3/uL (ref 0.0–0.5)
Eosinophils Relative: 4 %
HCT: 28.3 % — ABNORMAL LOW (ref 39.0–52.0)
Hemoglobin: 9.3 g/dL — ABNORMAL LOW (ref 13.0–17.0)
Immature Granulocytes: 3 %
Lymphocytes Relative: 19 %
Lymphs Abs: 1.3 10*3/uL (ref 0.7–4.0)
MCH: 30.2 pg (ref 26.0–34.0)
MCHC: 32.9 g/dL (ref 30.0–36.0)
MCV: 91.9 fL (ref 80.0–100.0)
Monocytes Absolute: 0.9 10*3/uL (ref 0.1–1.0)
Monocytes Relative: 14 %
Neutro Abs: 4.1 10*3/uL (ref 1.7–7.7)
Neutrophils Relative %: 59 %
Platelet Count: 436 10*3/uL — ABNORMAL HIGH (ref 150–400)
RBC: 3.08 MIL/uL — ABNORMAL LOW (ref 4.22–5.81)
RDW: 17.5 % — ABNORMAL HIGH (ref 11.5–15.5)
WBC Count: 6.8 10*3/uL (ref 4.0–10.5)
nRBC: 0 % (ref 0.0–0.2)

## 2021-08-10 MED ORDER — SODIUM CHLORIDE 0.9% FLUSH
10.0000 mL | Freq: Once | INTRAVENOUS | Status: AC
  Administered 2021-08-10: 10 mL

## 2021-08-10 MED ORDER — SODIUM CHLORIDE 0.9 % IV SOLN: Freq: Once | INTRAVENOUS | Status: AC

## 2021-08-10 MED ORDER — POTASSIUM CHLORIDE IN NACL 20-0.9 MEQ/L-% IV SOLN
Freq: Once | INTRAVENOUS | Status: AC
Start: 2021-08-10 — End: 2021-08-10
  Filled 2021-08-10: qty 1000

## 2021-08-10 MED ORDER — SODIUM CHLORIDE 0.9 % IV SOLN
150.0000 mg | Freq: Once | INTRAVENOUS | Status: AC
  Administered 2021-08-10: 150 mg via INTRAVENOUS
  Filled 2021-08-10: qty 150

## 2021-08-10 MED ORDER — SODIUM CHLORIDE 0.9 % IV SOLN
1000.0000 mg/m2 | Freq: Once | INTRAVENOUS | Status: AC
  Administered 2021-08-10: 2204 mg via INTRAVENOUS
  Filled 2021-08-10: qty 57.97

## 2021-08-10 MED ORDER — SODIUM CHLORIDE 0.9 % IV SOLN
10.0000 mg | Freq: Once | INTRAVENOUS | Status: AC
  Administered 2021-08-10: 10 mg via INTRAVENOUS
  Filled 2021-08-10: qty 10

## 2021-08-10 MED ORDER — PALONOSETRON HCL INJECTION 0.25 MG/5ML
0.2500 mg | Freq: Once | INTRAVENOUS | Status: AC
  Administered 2021-08-10: 0.25 mg via INTRAVENOUS
  Filled 2021-08-10: qty 5

## 2021-08-10 MED ORDER — SODIUM CHLORIDE 0.9 % IV SOLN
70.0000 mg/m2 | Freq: Once | INTRAVENOUS | Status: AC
  Administered 2021-08-10: 155 mg via INTRAVENOUS
  Filled 2021-08-10: qty 155

## 2021-08-10 MED ORDER — MAGNESIUM SULFATE 2 GM/50ML IV SOLN
2.0000 g | Freq: Once | INTRAVENOUS | Status: AC
  Administered 2021-08-10: 2 g via INTRAVENOUS
  Filled 2021-08-10: qty 50

## 2021-08-10 MED ORDER — SODIUM CHLORIDE 0.9% FLUSH
10.0000 mL | INTRAVENOUS | Status: DC | PRN
  Administered 2021-08-10: 10 mL

## 2021-08-10 MED ORDER — HEPARIN SOD (PORK) LOCK FLUSH 100 UNIT/ML IV SOLN
500.0000 [IU] | Freq: Once | INTRAVENOUS | Status: AC | PRN
  Administered 2021-08-10: 500 [IU]

## 2021-08-10 NOTE — Progress Notes (Signed)
Hematology and Oncology Follow Up Visit  Barry Gibson [id number] 10/16/1961 60 y.o. 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     Prior Therapy:   He underwent robotic assisted laparoscopic radical cystectomy and lymphadenectomy January 2022.  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  He had 4 out of 7 lymph nodes involved with the final pathological staging is T4N2 disease.     He underwent staging scans on April 19, 2021 which showed interval progression of abdominal pelvic adenopathy consistent with worsening nodal metastasis.   Current therapy:  Chemotherapy utilizing gemcitabine and cisplatin started on June 08, 2021 receiving day 1 cisplatin and gemcitabine with day 8 gemcitabine alone.  He is here for day 1 of cycle 4 of therapy  Interim History: [name] presents today for a follow-up visit.  Since the last visit, he reports no major changes in his health.  He did report some more complications associated with the last treatment of chemotherapy.  Did have some nausea and vomiting on few occasions.  Still able to eat and maintain hydration at this time.  His energy is back to baseline prior to this current cycle.  He denies any worsening neuropathy or excessive fatigue.     Medications: Reviewed without changes. Current Outpatient Medications  Medication Sig Dispense Refill   acetaminophen (TYLENOL) 650 MG CR tablet Take 1,300 mg by mouth every morning.     amLODipine (NORVASC) 10 MG tablet Take 1 tablet (10 mg total) by mouth daily. 90 tablet 1   lidocaine-prilocaine (EMLA) cream Apply 1 application topically as needed. 30 g 0   omeprazole (PRILOSEC) 40 MG capsule Take 40 mg by mouth daily.     pravastatin  (PRAVACHOL) 40 MG tablet TAKE 1 TABLET(40 MG) BY MOUTH DAILY 90 tablet 1   prednisoLONE acetate (PRED FORTE) 1 % ophthalmic suspension Place 1 drop into the left eye 2 (two) times a week. Instill 1 drop into left eye Monday and Friday.     prochlorperazine (COMPAZINE) 10 MG tablet TAKE 1 TABLET(10 MG) BY MOUTH EVERY 6 HOURS AS NEEDED FOR NAUSEA OR VOMITING 90 tablet 2   sertraline (ZOLOFT) 50 MG tablet TAKE 1 TABLET(50 MG) BY MOUTH DAILY 90 tablet 0   No current facility-administered medications for this visit.   Facility-Administered Medications Ordered in Other Visits  Medication Dose Route Frequency Provider Last Rate Last Admin   acetaminophen (TYLENOL) 325 MG tablet            diphenhydrAMINE (BENADRYL) 25 mg capsule            gemcitabine (GEMZAR) chemo syringe for bladder instillation 2,000 mg  2,000 mg Bladder Instillation Once Franchot Gallo, MD       gemcitabine [facility]) chemo syringe for bladder instillation 2,000 mg  2,000 mg Bladder Instillation Once Franchot Gallo, MD         Allergies:  Allergies  Allergen Reactions   Ace Inhibitors Cough   Diflucan [Fluconazole] Other (See Comments)    Irritated ulcers      Physical Exam:  Blood pressure 124/80, pulse 75, temperature (!) 96.5 F (35.8 C), temperature source Tympanic, resp. rate 17, height 5\' 11"  (1.803 Gibson), weight 211 lb 3.2 oz (95.8 kg), SpO2 100 %.   ECOG: 0  General appearance: Comfortable appearing without any discomfort Head: Normocephalic without any trauma Oropharynx: Mucous membranes are moist and pink without any thrush or ulcers. Eyes: Pupils are equal and round reactive to light. Lymph nodes: No cervical, supraclavicular, inguinal or axillary lymphadenopathy.   Heart:regular rate and rhythm.  S1 and S2 without leg edema. Lung: Clear without any rhonchi or wheezes.  No dullness to percussion. Abdomin: Soft, nontender, nondistended with good bowel sounds.  No  hepatosplenomegaly. Musculoskeletal: No joint deformity or effusion.  Full range of motion noted. Neurological: No deficits noted on motor, sensory and deep tendon reflex exam. Skin: No petechial rash or dryness.  Appeared moist.        Lab Results: Lab Results  Component Value Date   WBC 6.6 07/27/2021   HGB 10.3 (L) 07/27/2021   HCT 30.2 (L) 07/27/2021   MCV 88.6 07/27/2021   PLT 290 07/27/2021     Chemistry      Component Value Date/Time   NA 141 07/27/2021 1411   NA 136 10/06/2020 1011   K 4.2 07/27/2021 1411   CL 108 07/27/2021 1411   CO2 23 07/27/2021 1411   BUN 20 07/27/2021 1411   BUN 20 10/06/2020 1011   CREATININE 1.24 07/27/2021 1411   CREATININE 1.05 11/25/2014 0826      Component Value Date/Time   CALCIUM 9.0 07/27/2021 1411   ALKPHOS 85 07/27/2021 1411   AST 15 07/27/2021 1411   ALT 11 07/27/2021 1411   BILITOT <0.2 (L) 07/27/2021 1411         Impression and Plan:  60 year old man with:  1.  Stage IV high-grade urothelial carcinoma with adenopathy diagnosed in June 2022.   He continues to receive palliative platinum based chemotherapy without any major complications.  Risks and benefits of continuing this treatment were reviewed at this time.  Plan is to proceed with cycle 4 and update his staging scans subsequently.  Switch maintenance to immunotherapy will be considered subsequently.   2.  IV access: Port-A-Cath remains in use without any issues.   3.  Antiemetics: Compazine is available to him.  We will consider additional antiemetics if he has any further issues.   4.  Renal function surveillance: Creatinine clearance remains within normal range and will continue to monitor on platinum therapy.   5.  Goals of care: Aggressive measures are warranted given his excellent performance status but treatment remains palliative.   6.  Follow-up: In 1 week to complete the current cycle and in 3 weeks for the start of the next cycle of treatment.    30  minutes were spent on this visit.  The time was dedicated to reviewing laboratory data, disease status update, treatment choices and future plan of care discussion.   Zola Button, MD 9/23/20228:05 AM

## 2021-08-10 NOTE — Progress Notes (Signed)
DISCONTINUE ON PATHWAY REGIMEN - Bladder     A cycle is every 21 days:     Gemcitabine      Cisplatin   **Always confirm dose/schedule in your pharmacy ordering system**  REASON: Toxicities / Adverse Event PRIOR TREATMENT: [id number]: Gemcitabine 1,000 mg/m2 D1, 8 + Cisplatin 70 mg/m2 D1 q21 Days for a Maximum of 6 Cycles TREATMENT RESPONSE: Partial Response (PR)  START OFF PATHWAY REGIMEN - Bladder   [id number]:Pembrolizumab 200 mg IV D1 q21 Days:   A cycle is every 21 days:     Pembrolizumab   **Always confirm dose/schedule in your pharmacy ordering system**  Patient Characteristics: Advanced/Metastatic Disease, Maintenance Therapy Therapeutic Status: Advanced/Metastatic Disease Line of Therapy: Maintenance Therapy  Intent of Therapy: Non-Curative / Palliative Intent, Discussed with Patient

## 2021-08-10 NOTE — Patient Instructions (Signed)
[facility]ICAL [facility]COL[facility]  Discharge Instructions: Thank you for choosing [facility] to provide your oncology and hematology care.   If you have a lab appointment with the Cancer Center, please go directly to the Cancer Center and check in at the registration area.   Wear comfortable clothing and clothing appropriate for easy access to any Portacath or PICC line.   We strive to give you quality time with your provider. You may need to reschedule your appointment if you arrive late (15 or more minutes).  Arriving late affects you and other patients whose appointments are after yours.  Also, if you miss three or more appointments without notifying the office, you may be dismissed from the clinic at the provider's discretion.      For prescription refill requests, have your pharmacy contact our office and allow 72 hours for refills to be completed.    Today you received the following chemotherapy and/or immunotherapy agents gemzar, cisplatin      To help prevent nausea and vomiting after your treatment, we encourage you to take your nausea medication as directed.  BELOW ARE SYMPTOMS THAT SHOULD BE REPORTED IMMEDIATELY: *FEVER GREATER THAN 100.4 F (38 C) OR HIGHER *CHILLS OR SWEATING *NAUSEA AND VOMITING THAT IS NOT CONTROLLED WITH YOUR NAUSEA MEDICATION *UNUSUAL SHORTNESS OF BREATH *UNUSUAL BRUISING OR BLEEDING *URINARY PROBLEMS (pain or burning when urinating, or frequent urination) *BOWEL PROBLEMS (unusual diarrhea, constipation, pain near the anus) TENDERNESS IN MOUTH AND THROAT WITH OR WITHOUT PRESENCE OF ULCERS (sore throat, sores in mouth, or a toothache) UNUSUAL RASH, SWELLING OR PAIN  UNUSUAL VAGINAL DISCHARGE OR ITCHING   Items with * indicate a potential emergency and should be followed up as soon as possible or go to the Emergency Department if any problems should occur.  Please show the CHEMOTHERAPY ALERT CARD or IMMUNOTHERAPY ALERT CARD at  check-in to the Emergency Department and triage nurse.  Should you have questions after your visit or need to cancel or reschedule your appointment, please contact [facility]  Dept: [phone]  and follow the prompts.  Office hours are 8:00 a.m. to 4:30 p.m. Monday - Friday. Please note that voicemails left after 4:00 p.m. may not be returned until the following business day.  We are closed weekends and major holidays. You have access to a nurse at all times for urgent questions. Please call the main number to the clinic Dept: [phone] and follow the prompts.   For any non-urgent questions, you may also contact your provider using MyChart. We now offer e-Visits for anyone 18 and older to request care online for non-urgent symptoms. For details visit [email or web address].   Also download the MyChart app! Go to the app store, search "MyChart", open the app, select [facility], and log in with your MyChart username and password.  Due to Covid, a mask is required upon entering the hospital/clinic. If you do not have a mask, one will be given to you upon arrival. For doctor visits, patients may have 1 support person aged 18 or older with them. For treatment visits, patients cannot have anyone with them due to current Covid guidelines and our immunocompromised population.   

## 2021-08-16 ENCOUNTER — Telehealth: Payer: Self-pay | Admitting: Oncology

## 2021-08-16 NOTE — Telephone Encounter (Signed)
Scheduled appointment per 09/29 los. Patient is aware.

## 2021-08-17 ENCOUNTER — Other Ambulatory Visit: Payer: Self-pay

## 2021-08-17 ENCOUNTER — Inpatient Hospital Stay: Payer: BC Managed Care – PPO

## 2021-08-17 ENCOUNTER — Ambulatory Visit: Payer: BC Managed Care – PPO

## 2021-08-17 ENCOUNTER — Other Ambulatory Visit: Payer: BC Managed Care – PPO

## 2021-08-17 VITALS — BP 131/85 | HR 73 | Temp 98.5°F | Resp 18 | Ht 71.0 in | Wt 210.5 lb

## 2021-08-17 DIAGNOSIS — Z95828 Presence of other vascular implants and grafts: Secondary | ICD-10-CM

## 2021-08-17 DIAGNOSIS — C670 Malignant neoplasm of trigone of bladder: Secondary | ICD-10-CM | POA: Diagnosis not present

## 2021-08-17 DIAGNOSIS — C679 Malignant neoplasm of bladder, unspecified: Secondary | ICD-10-CM

## 2021-08-17 LAB — CMP (CANCER CENTER ONLY)
ALT: 11 U/L (ref 0–44)
AST: 17 U/L (ref 15–41)
Albumin: 3.3 g/dL — ABNORMAL LOW (ref 3.5–5.0)
Alkaline Phosphatase: 82 U/L (ref 38–126)
Anion gap: 10 (ref 5–15)
BUN: 20 mg/dL (ref 6–20)
CO2: 24 mmol/L (ref 22–32)
Calcium: 8.9 mg/dL (ref 8.9–10.3)
Chloride: 107 mmol/L (ref 98–111)
Creatinine: 1.23 mg/dL (ref 0.61–1.24)
GFR, Estimated: >60 mL/min (ref >60)
Glucose, Bld: 126 mg/dL — ABNORMAL HIGH (ref 70–99)
Potassium: 3.8 mmol/L (ref 3.5–5.1)
Sodium: 141 mmol/L (ref 135–145)
Total Bilirubin: 0.3 mg/dL (ref 0.3–1.2)
Total Protein: 6.9 g/dL (ref 6.5–8.1)

## 2021-08-17 LAB — CBC WITH DIFFERENTIAL (CANCER CENTER ONLY)
Abs Immature Granulocytes: 0.12 10*3/uL — ABNORMAL HIGH (ref 0.00–0.07)
Basophils Absolute: 0.1 10*3/uL (ref 0.0–0.1)
Basophils Relative: 2 %
Eosinophils Absolute: 0.1 10*3/uL (ref 0.0–0.5)
Eosinophils Relative: 1 %
HCT: 28.7 % — ABNORMAL LOW (ref 39.0–52.0)
Hemoglobin: 9.5 g/dL — ABNORMAL LOW (ref 13.0–17.0)
Immature Granulocytes: 2 %
Lymphocytes Relative: 27 %
Lymphs Abs: 1.5 10*3/uL (ref 0.7–4.0)
MCH: 30.3 pg (ref 26.0–34.0)
MCHC: 33.1 g/dL (ref 30.0–36.0)
MCV: 91.4 fL (ref 80.0–100.0)
Monocytes Absolute: 0.4 10*3/uL (ref 0.1–1.0)
Monocytes Relative: 6 %
Neutro Abs: 3.4 10*3/uL (ref 1.7–7.7)
Neutrophils Relative %: 62 %
Platelet Count: 311 10*3/uL (ref 150–400)
RBC: 3.14 MIL/uL — ABNORMAL LOW (ref 4.22–5.81)
RDW: 17.5 % — ABNORMAL HIGH (ref 11.5–15.5)
WBC Count: 5.5 10*3/uL (ref 4.0–10.5)
nRBC: 0 % (ref 0.0–0.2)

## 2021-08-17 MED ORDER — SODIUM CHLORIDE 0.9% FLUSH
10.0000 mL | INTRAVENOUS | Status: DC | PRN
  Administered 2021-08-17: 10 mL

## 2021-08-17 MED ORDER — SODIUM CHLORIDE 0.9 % IV SOLN
1000.0000 mg/m2 | Freq: Once | INTRAVENOUS | Status: AC
  Administered 2021-08-17: 2204 mg via INTRAVENOUS
  Filled 2021-08-17: qty 57.97

## 2021-08-17 MED ORDER — SODIUM CHLORIDE 0.9% FLUSH
10.0000 mL | Freq: Once | INTRAVENOUS | Status: AC
  Administered 2021-08-17: 10 mL

## 2021-08-17 MED ORDER — PROCHLORPERAZINE MALEATE 10 MG PO TABS
10.0000 mg | ORAL_TABLET | Freq: Once | ORAL | Status: AC
  Administered 2021-08-17: 10 mg via ORAL
  Filled 2021-08-17: qty 1

## 2021-08-17 MED ORDER — SODIUM CHLORIDE 0.9 % IV SOLN: Freq: Once | INTRAVENOUS | Status: AC

## 2021-08-17 MED ORDER — HEPARIN SOD (PORK) LOCK FLUSH 100 UNIT/ML IV SOLN
500.0000 [IU] | Freq: Once | INTRAVENOUS | Status: AC | PRN
  Administered 2021-08-17: 500 [IU]

## 2021-08-17 NOTE — Patient Instructions (Signed)
[facility]COL[facility]   Discharge Instructions: Thank you for choosing [facility] to provide your oncology and hematology care.   If you have a lab appointment with the Cancer Center, please go directly to the Cancer Center and check in at the registration area.   Wear comfortable clothing and clothing appropriate for easy access to any Portacath or PICC line.   We strive to give you quality time with your provider. You may need to reschedule your appointment if you arrive late (15 or more minutes).  Arriving late affects you and other patients whose appointments are after yours.  Also, if you miss three or more appointments without notifying the office, you may be dismissed from the clinic at the provider's discretion.      For prescription refill requests, have your pharmacy contact our office and allow 72 hours for refills to be completed.    Today you received the following chemotherapy and/or immunotherapy agents: Gemcitabine (Gemzar)      To help prevent nausea and vomiting after your treatment, we encourage you to take your nausea medication as directed.  BELOW ARE SYMPTOMS THAT SHOULD BE REPORTED IMMEDIATELY: *FEVER GREATER THAN 100.4 F (38 C) OR HIGHER *CHILLS OR SWEATING *NAUSEA AND VOMITING THAT IS NOT CONTROLLED WITH YOUR NAUSEA MEDICATION *UNUSUAL SHORTNESS OF BREATH *UNUSUAL BRUISING OR BLEEDING *URINARY PROBLEMS (pain or burning when urinating, or frequent urination) *BOWEL PROBLEMS (unusual diarrhea, constipation, pain near the anus) TENDERNESS IN MOUTH AND THROAT WITH OR WITHOUT PRESENCE OF ULCERS (sore throat, sores in mouth, or a toothache) UNUSUAL RASH, SWELLING OR PAIN  UNUSUAL VAGINAL DISCHARGE OR ITCHING   Items with * indicate a potential emergency and should be followed up as soon as possible or go to the Emergency Department if any problems should occur.  Please show the CHEMOTHERAPY ALERT CARD or IMMUNOTHERAPY ALERT CARD at  check-in to the Emergency Department and triage nurse.  Should you have questions after your visit or need to cancel or reschedule your appointment, please contact [facility]  Dept: [phone]  and follow the prompts.  Office hours are 8:00 a.m. to 4:30 p.m. Monday - Friday. Please note that voicemails left after 4:00 p.m. may not be returned until the following business day.  We are closed weekends and major holidays. You have access to a nurse at all times for urgent questions. Please call the main number to the clinic Dept: [phone] and follow the prompts.   For any non-urgent questions, you may also contact your provider using MyChart. We now offer e-Visits for anyone 18 and older to request care online for non-urgent symptoms. For details visit [email or web address].   Also download the MyChart app! Go to the app store, search "MyChart", open the app, select [facility], and log in with your MyChart username and password.  Due to Covid, a mask is required upon entering the hospital/clinic. If you do not have a mask, one will be given to you upon arrival. For doctor visits, patients may have 1 support person aged 18 or older with them. For treatment visits, patients cannot have anyone with them due to current Covid guidelines and our immunocompromised population.   

## 2021-08-31 ENCOUNTER — Other Ambulatory Visit: Payer: Self-pay

## 2021-08-31 ENCOUNTER — Telehealth: Payer: Self-pay | Admitting: Oncology

## 2021-08-31 ENCOUNTER — Ambulatory Visit (HOSPITAL_COMMUNITY)
Admission: RE | Admit: 2021-08-31 | Discharge: 2021-08-31 | Disposition: A | Discharge status: Home / Self Care | Payer: BC Managed Care – PPO | Source: Ambulatory Visit | Attending: Oncology | Admitting: Oncology

## 2021-08-31 DIAGNOSIS — C679 Malignant neoplasm of bladder, unspecified: Secondary | ICD-10-CM | POA: Insufficient documentation

## 2021-08-31 MED ORDER — SODIUM CHLORIDE 0.9 % IV SOLN
INTRAVENOUS | Status: AC
  Filled 2021-08-31: qty 250

## 2021-08-31 MED ORDER — IOHEXOL 350 MG/ML SOLN
100.0000 mL | Freq: Once | INTRAVENOUS | Status: AC | PRN
  Administered 2021-08-31: 100 mL via INTRAVENOUS

## 2021-08-31 NOTE — Telephone Encounter (Signed)
Called patient regarding upcoming October and November appointments, patient will be notified.

## 2021-09-07 ENCOUNTER — Inpatient Hospital Stay: Payer: BC Managed Care – PPO

## 2021-09-07 ENCOUNTER — Other Ambulatory Visit: Payer: Self-pay

## 2021-09-07 ENCOUNTER — Inpatient Hospital Stay: Payer: BC Managed Care – PPO | Attending: Oncology

## 2021-09-07 ENCOUNTER — Ambulatory Visit: Payer: BC Managed Care – PPO

## 2021-09-07 ENCOUNTER — Ambulatory Visit: Payer: BC Managed Care – PPO | Admitting: Oncology

## 2021-09-07 ENCOUNTER — Other Ambulatory Visit: Payer: BC Managed Care – PPO

## 2021-09-07 ENCOUNTER — Inpatient Hospital Stay (HOSPITAL_BASED_OUTPATIENT_CLINIC_OR_DEPARTMENT_OTHER): Payer: BC Managed Care – PPO | Admitting: Oncology

## 2021-09-07 VITALS — BP 128/83 | HR 85 | Temp 97.8°F | Resp 16 | Ht 71.0 in | Wt 206.4 lb

## 2021-09-07 DIAGNOSIS — C679 Malignant neoplasm of bladder, unspecified: Secondary | ICD-10-CM

## 2021-09-07 DIAGNOSIS — Z5112 Encounter for antineoplastic immunotherapy: Secondary | ICD-10-CM | POA: Insufficient documentation

## 2021-09-07 DIAGNOSIS — I700 Atherosclerosis of aorta: Secondary | ICD-10-CM | POA: Diagnosis not present

## 2021-09-07 DIAGNOSIS — C670 Malignant neoplasm of trigone of bladder: Secondary | ICD-10-CM | POA: Diagnosis present

## 2021-09-07 DIAGNOSIS — M25519 Pain in unspecified shoulder: Secondary | ICD-10-CM | POA: Insufficient documentation

## 2021-09-07 DIAGNOSIS — K8020 Calculus of gallbladder without cholecystitis without obstruction: Secondary | ICD-10-CM | POA: Diagnosis not present

## 2021-09-07 DIAGNOSIS — Z95828 Presence of other vascular implants and grafts: Secondary | ICD-10-CM

## 2021-09-07 DIAGNOSIS — M069 Rheumatoid arthritis, unspecified: Secondary | ICD-10-CM | POA: Diagnosis not present

## 2021-09-07 LAB — CMP (CANCER CENTER ONLY)
ALT: 10 U/L (ref 0–44)
AST: 14 U/L — ABNORMAL LOW (ref 15–41)
Albumin: 3.1 g/dL — ABNORMAL LOW (ref 3.5–5.0)
Alkaline Phosphatase: 83 U/L (ref 38–126)
Anion gap: 7 (ref 5–15)
BUN: 21 mg/dL — ABNORMAL HIGH (ref 6–20)
CO2: 22 mmol/L (ref 22–32)
Calcium: 8.5 mg/dL — ABNORMAL LOW (ref 8.9–10.3)
Chloride: 109 mmol/L (ref 98–111)
Creatinine: 1.42 mg/dL — ABNORMAL HIGH (ref 0.61–1.24)
GFR, Estimated: 57 mL/min — ABNORMAL LOW
Glucose, Bld: 133 mg/dL — ABNORMAL HIGH (ref 70–99)
Potassium: 4 mmol/L (ref 3.5–5.1)
Sodium: 138 mmol/L (ref 135–145)
Total Bilirubin: 0.3 mg/dL (ref 0.3–1.2)
Total Protein: 7.3 g/dL (ref 6.5–8.1)

## 2021-09-07 LAB — CBC WITH DIFFERENTIAL (CANCER CENTER ONLY)
Abs Immature Granulocytes: 0.93 K/uL — ABNORMAL HIGH (ref 0.00–0.07)
Basophils Absolute: 0.2 K/uL — ABNORMAL HIGH (ref 0.0–0.1)
Basophils Relative: 1 %
Eosinophils Absolute: 0.1 K/uL (ref 0.0–0.5)
Eosinophils Relative: 1 %
HCT: 28.2 % — ABNORMAL LOW (ref 39.0–52.0)
Hemoglobin: 9.1 g/dL — ABNORMAL LOW (ref 13.0–17.0)
Immature Granulocytes: 9 %
Lymphocytes Relative: 23 %
Lymphs Abs: 2.5 K/uL (ref 0.7–4.0)
MCH: 30.3 pg (ref 26.0–34.0)
MCHC: 32.3 g/dL (ref 30.0–36.0)
MCV: 94 fL (ref 80.0–100.0)
Monocytes Absolute: 1.1 K/uL — ABNORMAL HIGH (ref 0.1–1.0)
Monocytes Relative: 10 %
Neutro Abs: 6.2 K/uL (ref 1.7–7.7)
Neutrophils Relative %: 56 %
Platelet Count: 538 K/uL — ABNORMAL HIGH (ref 150–400)
RBC: 3 MIL/uL — ABNORMAL LOW (ref 4.22–5.81)
RDW: 19.9 % — ABNORMAL HIGH (ref 11.5–15.5)
WBC Count: 10.9 K/uL — ABNORMAL HIGH (ref 4.0–10.5)
nRBC: 0 % (ref 0.0–0.2)

## 2021-09-07 MED ORDER — HEPARIN SOD (PORK) LOCK FLUSH 100 UNIT/ML IV SOLN
500.0000 [IU] | Freq: Once | INTRAVENOUS | Status: AC | PRN
  Administered 2021-09-07: 500 [IU]

## 2021-09-07 MED ORDER — SODIUM CHLORIDE 0.9% FLUSH
10.0000 mL | Freq: Once | INTRAVENOUS | Status: AC
  Administered 2021-09-07: 10 mL

## 2021-09-07 MED ORDER — SODIUM CHLORIDE 0.9% FLUSH
10.0000 mL | INTRAVENOUS | Status: DC | PRN
  Administered 2021-09-07: 10 mL

## 2021-09-07 MED ORDER — SODIUM CHLORIDE 0.9 % IV SOLN: Freq: Once | INTRAVENOUS | Status: AC

## 2021-09-07 MED ORDER — SODIUM CHLORIDE 0.9 % IV SOLN
200.0000 mg | Freq: Once | INTRAVENOUS | Status: AC
  Administered 2021-09-07: 200 mg via INTRAVENOUS
  Filled 2021-09-07: qty 8

## 2021-09-07 NOTE — Progress Notes (Signed)
Hematology and Oncology Follow Up Visit  Barry Gibson [id number] 1961/09/13 60 y.o. 09/07/2021 12:39 PM [name], DOTaylor, Malena M, DO   Principle Diagnosis: 60 year old man with bladder cancer diagnosed in 2022.  He was found to have stage IV (T4N2) high-grade urothelial carcinoma with adenopathy.   Prior Therapy:   He underwent robotic assisted laparoscopic radical cystectomy and lymphadenectomy January 2022.  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  He had 4 out of 7 lymph nodes involved with the final pathological staging is T4N2 disease.     He underwent staging scans on April 19, 2021 which showed interval progression of abdominal pelvic adenopathy consistent with worsening nodal metastasis.    Chemotherapy utilizing gemcitabine and cisplatin started on June 08, 2021 receiving day 1 cisplatin and gemcitabine with day 8 gemcitabine alone.  He completed cycle 4 on August 17, 2021.   Current therapy: Pembrolizumab 200 mg every 3 weeks tentatively to start on September 07, 2021.    Interim History: Barry Gibson returns today for repeat evaluation.  Since the last visit, he reports a few complaints related to chemotherapy.  He has reported some mild nausea no vomiting or abdominal pain.  He reported alteration in his taste and of lost some weight.  He denies any recent hospitalizations or illnesses.  He denies any difficulty breathing.  He does report mild shoulder pain related to his rheumatoid arthritis which is manageable at this time.     Medications: Reviewed without changes. Current Outpatient Medications  Medication Sig Dispense Refill   acetaminophen (TYLENOL) 650 MG CR tablet Take 1,300 mg by mouth every morning.     amLODipine (NORVASC) 10 MG tablet Take 1 tablet (10 mg total) by mouth daily. 90 tablet 1   lidocaine-prilocaine (EMLA)  cream Apply 1 application topically as needed. 30 g 0   omeprazole (PRILOSEC) 40 MG capsule Take 40 mg by mouth daily.     pravastatin (PRAVACHOL) 40 MG tablet TAKE 1 TABLET(40 MG) BY MOUTH DAILY 90 tablet 1   prednisoLONE acetate (PRED FORTE) 1 % ophthalmic suspension Place 1 drop into the left eye 2 (two) times a week. Instill 1 drop into left eye Monday and Friday.     prochlorperazine (COMPAZINE) 10 MG tablet TAKE 1 TABLET(10 MG) BY MOUTH EVERY 6 HOURS AS NEEDED FOR NAUSEA OR VOMITING 90 tablet 2   sertraline (ZOLOFT) 50 MG tablet TAKE 1 TABLET(50 MG) BY MOUTH DAILY 90 tablet 0   No current facility-administered medications for this visit.   Facility-Administered Medications Ordered in Other Visits  Medication Dose Route Frequency Provider Last Rate Last Admin   acetaminophen (TYLENOL) 325 MG tablet            diphenhydrAMINE (BENADRYL) 25 mg capsule            gemcitabine (GEMZAR) chemo syringe for bladder instillation 2,000 mg  2,000 mg Bladder Instillation Once Franchot Gallo, MD       gemcitabine [facility]) chemo syringe for bladder instillation 2,000 mg  2,000 mg Bladder Instillation Once Franchot Gallo, MD       sodium chloride flush (NS) 0.9 % injection 10 mL  10 mL Intracatheter Once Wyatt Portela, MD         Allergies:  Allergies  Allergen Reactions   Ace Inhibitors Cough   Diflucan [Fluconazole] Other (See Comments)    Irritated ulcers  Physical Exam:  Blood pressure 128/83, pulse 85, temperature 97.8 F (36.6 C), temperature source Tympanic, resp. rate 16, height 5\' 11"  (1.803 m), weight 206 lb 6.4 oz (93.6 kg), SpO2 100 %. 6   ECOG: 0    General appearance: Alert, awake without any distress. Head: Atraumatic without abnormalities Oropharynx: Without any thrush or ulcers. Eyes: No scleral icterus. Lymph nodes: No lymphadenopathy noted in the cervical, supraclavicular, or axillary nodes Heart:regular rate and rhythm, without any murmurs or  gallops.   Lung: Clear to auscultation without any rhonchi, wheezes or dullness to percussion. Abdomin: Soft, nontender without any shifting dullness or ascites. Musculoskeletal: No clubbing or cyanosis. Neurological: No motor or sensory deficits. Skin: No rashes or lesions.        Lab Results: Lab Results  Component Value Date   WBC 5.5 08/17/2021   HGB 9.5 (L) 08/17/2021   HCT 28.7 (L) 08/17/2021   MCV 91.4 08/17/2021   PLT 311 08/17/2021     Chemistry      Component Value Date/Time   NA 141 08/17/2021 1212   NA 136 10/06/2020 1011   K 3.8 08/17/2021 1212   CL 107 08/17/2021 1212   CO2 24 08/17/2021 1212   BUN 20 08/17/2021 1212   BUN 20 10/06/2020 1011   CREATININE 1.23 08/17/2021 1212   CREATININE 1.05 11/25/2014 0826      Component Value Date/Time   CALCIUM 8.9 08/17/2021 1212   ALKPHOS 82 08/17/2021 1212   AST 17 08/17/2021 1212   ALT 11 08/17/2021 1212   BILITOT 0.3 08/17/2021 1212      IMPRESSION: Marked interval response to therapy as above without new or progressive findings.   No evidence of metastatic disease to the chest.   Post cystoprostatectomy and creation of urinary diversion.   Reflux of air into the collecting systems with prone positioning may be of little clinical significance, more likely related to positioning and reflux of air from the ostomy bag into the urinary diversion. Correlate with any urinary symptoms and with urinalysis as warranted.   Slight increase in size of and soft plaque within an infrarenal abdominal aortic aneurysm. Recommend follow-up every 2 years. This recommendation follows ACR consensus guidelines: White Paper of the ACR Incidental Findings Committee II on Vascular Findings. J Am Coll Radiol 2013; 10:789-794.   Cholelithiasis.   Aortic Atherosclerosis (ICD10-I70.0).   Impression and Plan:  60 year old man with:  1.  Bladder cancer diagnosed in June 2022.  He was found to have stage IV high-grade  urothelial carcinoma with pelvic adenopathy.   He completed 4 cycles of gemcitabine and cisplatin with excellent therapy response.  His lymphadenopathy has normalized at this time without any evidence of clear-cut residual disease.  Risks and benefits of switching to Pembrolizumab were discussed at this time.  Complications that include autoimmune complications, GI toxicity among others were reiterated.  He is agreeable to proceed and we will potentially extend to every 6 weeks pending his tolerance.  The duration of therapy depending on his tolerance and response likely close to a year.   2.  IV access: Port-A-Cath will continue to be in use at this time.   3.  Antiemetics: No nausea or vomiting reported at this time Compazine is available to him.   4.  Renal function surveillance: Kidney function remains stable after platinum therapy.   5.  Goals of care: Therapy remains palliative although aggressive measures are warranted given his excellent response to therapy.  6.  Immune  mediated complications: We will continue to monitor at this time including pneumonitis, colitis and thyroid disease.  He does have history of rheumatoid arthritis that is manageable at this time.  Flareup of rheumatoid arthritis were discussed at this time and management choices were reviewed and currently appears to be doing well without any need for any additional treatment.    7.  Follow-up: In 3 weeks for the next cycle of therapy.   30  minutes were dedicated to this encounter.  The time was spent on reviewing laboratory data, disease status update and outlining future plan of care discussion.   Zola Button, MD 10/21/202212:39 PM

## 2021-09-07 NOTE — Patient Instructions (Signed)
[facility]   Discharge Instructions: Thank you for choosing [facility] to provide your oncology and hematology care.   If you have a lab appointment with the [facility], please go directly to the [facility] and check in at the registration area.   Wear comfortable clothing and clothing appropriate for easy access to any Portacath or PICC line.   We strive to give you quality time with your provider. You may need to reschedule your appointment if you arrive late (15 or more minutes).  Arriving late affects you and other patients whose appointments are after yours.  Also, if you miss three or more appointments without notifying the office, you may be dismissed from the clinic at the provider's discretion.      For prescription refill requests, have your pharmacy contact our office and allow 72 hours for refills to be completed.    Today you received the following chemotherapy and/or immunotherapy agents: pembrolizumab.      To help prevent nausea and vomiting after your treatment, we encourage you to take your nausea medication as directed.  BELOW ARE SYMPTOMS THAT SHOULD BE REPORTED IMMEDIATELY: *FEVER GREATER THAN 100.4 F (38 C) OR HIGHER *CHILLS OR SWEATING *NAUSEA AND VOMITING THAT IS NOT CONTROLLED WITH YOUR NAUSEA MEDICATION *UNUSUAL SHORTNESS OF BREATH *UNUSUAL BRUISING OR BLEEDING *URINARY PROBLEMS (pain or burning when urinating, or frequent urination) *BOWEL PROBLEMS (unusual diarrhea, constipation, pain near the anus) TENDERNESS IN MOUTH AND THROAT WITH OR WITHOUT PRESENCE OF ULCERS (sore throat, sores in mouth, or a toothache) UNUSUAL RASH, SWELLING OR PAIN  UNUSUAL VAGINAL DISCHARGE OR ITCHING   Items with * indicate a potential emergency and should be followed up as soon as possible or go to the Emergency Department if any problems should occur.  Please show the CHEMOTHERAPY ALERT CARD or IMMUNOTHERAPY ALERT CARD at  check-in to the Emergency Department and triage nurse.  Should you have questions after your visit or need to cancel or reschedule your appointment, please contact [facility]  Dept: [phone]  and follow the prompts.  Office hours are 8:00 a.m. to 4:30 p.m. Monday - Friday. Please note that voicemails left after 4:00 p.m. may not be returned until the following business day.  We are closed weekends and major holidays. You have access to a nurse at all times for urgent questions. Please call the main number to the clinic Dept: [phone] and follow the prompts.   For any non-urgent questions, you may also contact your provider using MyChart. We now offer e-Visits for anyone 58 and older to request care online for non-urgent symptoms. For details visit [email or web address].   Also download the MyChart app! Go to the app store, search "MyChart", open the app, select [facility], and log in with your MyChart username and password.  Due to Covid, a mask is required upon entering the hospital/clinic. If you do not have a mask, one will be given to you upon arrival. For doctor visits, patients may have 1 support person aged 76 or older with them. For treatment visits, patients cannot have anyone with them due to current Covid guidelines and our immunocompromised population.   Pembrolizumab injection What is this medication? PEMBROLIZUMAB (pem broe liz ue mab) is a monoclonal antibody. It is used to treat certain types of cancer. This medicine may be used for other purposes; ask your health care provider or pharmacist if you have questions. COMMON BRAND NAME(S): [employer]  What should I tell my care team before I take this medication? They need to know if you have any of these conditions: autoimmune diseases like Crohn's disease, ulcerative colitis, or lupus have had or planning to have an allogeneic stem cell transplant (uses someone else's stem cells) history of  organ transplant history of chest radiation nervous system problems like myasthenia gravis or Guillain-Barre syndrome an unusual or allergic reaction to pembrolizumab, other medicines, foods, dyes, or preservatives pregnant or trying to get pregnant breast-feeding How should I use this medication? This medicine is for infusion into a vein. It is given by a health care professional in a hospital or clinic setting. A special MedGuide will be given to you before each treatment. Be sure to read this information carefully each time. Talk to your pediatrician regarding the use of this medicine in children. While this drug may be prescribed for children as young as 6 months for selected conditions, precautions do apply. Overdosage: If you think you have taken too much of this medicine contact a poison control center or emergency room at once. NOTE: This medicine is only for you. Do not share this medicine with others. What if I miss a dose? It is important not to miss your dose. Call your doctor or health care professional if you are unable to keep an appointment. What may interact with this medication? Interactions have not been studied. This list may not describe all possible interactions. Give your health care provider a list of all the medicines, herbs, non-prescription drugs, or dietary supplements you use. Also tell them if you smoke, drink alcohol, or use illegal drugs. Some items may interact with your medicine. What should I watch for while using this medication? Your condition will be monitored carefully while you are receiving this medicine. You may need blood work done while you are taking this medicine. Do not become pregnant while taking this medicine or for 4 months after stopping it. Women should inform their doctor if they wish to become pregnant or think they might be pregnant. There is a potential for serious side effects to an unborn child. Talk to your health care professional or  pharmacist for more information. Do not breast-feed an infant while taking this medicine or for 4 months after the last dose. What side effects may I notice from receiving this medication? Side effects that you should report to your doctor or health care professional as soon as possible: allergic reactions like skin rash, itching or hives, swelling of the face, lips, or tongue bloody or black, tarry breathing problems changes in vision chest pain chills confusion constipation cough diarrhea dizziness or feeling faint or lightheaded fast or irregular heartbeat fever flushing joint pain low blood counts - this medicine may decrease the number of white blood cells, red blood cells and platelets. You may be at increased risk for infections and bleeding. muscle pain muscle weakness pain, tingling, numbness in the hands or feet persistent headache redness, blistering, peeling or loosening of the skin, including inside the mouth signs and symptoms of high blood sugar such as dizziness; dry mouth; dry skin; fruity breath; nausea; stomach pain; increased hunger or thirst; increased urination signs and symptoms of kidney injury like trouble passing urine or change in the amount of urine signs and symptoms of liver injury like dark urine, light-colored stools, loss of appetite, nausea, right upper belly pain, yellowing of the eyes or skin sweating swollen lymph nodes weight loss Side effects that usually do not require  medical attention (report to your doctor or health care professional if they continue or are bothersome): decreased appetite hair loss tiredness This list may not describe all possible side effects. Call your doctor for medical advice about side effects. You may report side effects to FDA at 1-800-FDA-1088. Where should I keep my medication? This drug is given in a hospital or clinic and will not be stored at home. NOTE: This sheet is a summary. It may not cover all possible  information. If you have questions about this medicine, talk to your doctor, pharmacist, or health care provider.  2022 Elsevier/Gold Standard (2019-10-06 21:44:53)

## 2021-09-07 NOTE — Progress Notes (Signed)
Per Dr Alen Blew, no Gemzar today; only first time Keytruda.

## 2021-09-12 ENCOUNTER — Telehealth: Payer: Self-pay | Admitting: *Deleted

## 2021-09-12 ENCOUNTER — Inpatient Hospital Stay: Payer: BC Managed Care – PPO

## 2021-09-12 ENCOUNTER — Other Ambulatory Visit: Payer: Self-pay | Admitting: *Deleted

## 2021-09-12 ENCOUNTER — Inpatient Hospital Stay (HOSPITAL_BASED_OUTPATIENT_CLINIC_OR_DEPARTMENT_OTHER): Payer: BC Managed Care – PPO | Admitting: Physician Assistant

## 2021-09-12 ENCOUNTER — Other Ambulatory Visit: Payer: Self-pay

## 2021-09-12 ENCOUNTER — Inpatient Hospital Stay (HOSPITAL_COMMUNITY)
Admission: AD | Admit: 2021-09-12 | Discharge: 2021-09-17 | DRG: 698 | Disposition: A | Discharge status: Home / Self Care | Payer: BC Managed Care – PPO | Attending: Internal Medicine | Admitting: Internal Medicine

## 2021-09-12 ENCOUNTER — Ambulatory Visit (HOSPITAL_COMMUNITY)
Admission: RE | Admit: 2021-09-12 | Discharge: 2021-09-12 | Disposition: A | Discharge status: Home / Self Care | Payer: BC Managed Care – PPO | Source: Ambulatory Visit | Attending: Physician Assistant | Admitting: Physician Assistant

## 2021-09-12 VITALS — BP 92/60 | HR 100 | Temp 97.2°F | Resp 18 | Wt 199.5 lb

## 2021-09-12 DIAGNOSIS — Z8616 Personal history of COVID-19: Secondary | ICD-10-CM | POA: Diagnosis not present

## 2021-09-12 DIAGNOSIS — M069 Rheumatoid arthritis, unspecified: Secondary | ICD-10-CM | POA: Diagnosis present

## 2021-09-12 DIAGNOSIS — E785 Hyperlipidemia, unspecified: Secondary | ICD-10-CM | POA: Diagnosis present

## 2021-09-12 DIAGNOSIS — Z95828 Presence of other vascular implants and grafts: Secondary | ICD-10-CM

## 2021-09-12 DIAGNOSIS — R0902 Hypoxemia: Secondary | ICD-10-CM

## 2021-09-12 DIAGNOSIS — E876 Hypokalemia: Secondary | ICD-10-CM | POA: Diagnosis present

## 2021-09-12 DIAGNOSIS — N3000 Acute cystitis without hematuria: Secondary | ICD-10-CM

## 2021-09-12 DIAGNOSIS — N390 Urinary tract infection, site not specified: Secondary | ICD-10-CM | POA: Diagnosis present

## 2021-09-12 DIAGNOSIS — E872 Acidosis, unspecified: Secondary | ICD-10-CM | POA: Diagnosis present

## 2021-09-12 DIAGNOSIS — Z87891 Personal history of nicotine dependence: Secondary | ICD-10-CM

## 2021-09-12 DIAGNOSIS — J189 Pneumonia, unspecified organism: Secondary | ICD-10-CM | POA: Diagnosis not present

## 2021-09-12 DIAGNOSIS — A419 Sepsis, unspecified organism: Secondary | ICD-10-CM | POA: Diagnosis present

## 2021-09-12 DIAGNOSIS — R509 Fever, unspecified: Secondary | ICD-10-CM

## 2021-09-12 DIAGNOSIS — F419 Anxiety disorder, unspecified: Secondary | ICD-10-CM | POA: Diagnosis present

## 2021-09-12 DIAGNOSIS — C670 Malignant neoplasm of trigone of bladder: Secondary | ICD-10-CM

## 2021-09-12 DIAGNOSIS — C679 Malignant neoplasm of bladder, unspecified: Secondary | ICD-10-CM | POA: Diagnosis present

## 2021-09-12 DIAGNOSIS — F32A Depression, unspecified: Secondary | ICD-10-CM | POA: Diagnosis present

## 2021-09-12 DIAGNOSIS — D849 Immunodeficiency, unspecified: Secondary | ICD-10-CM | POA: Diagnosis present

## 2021-09-12 DIAGNOSIS — Y833 Surgical operation with formation of external stoma as the cause of abnormal reaction of the patient, or of later complication, without mention of misadventure at the time of the procedure: Secondary | ICD-10-CM | POA: Diagnosis present

## 2021-09-12 DIAGNOSIS — D649 Anemia, unspecified: Secondary | ICD-10-CM | POA: Diagnosis present

## 2021-09-12 DIAGNOSIS — Z8551 Personal history of malignant neoplasm of bladder: Secondary | ICD-10-CM

## 2021-09-12 DIAGNOSIS — N179 Acute kidney failure, unspecified: Secondary | ICD-10-CM | POA: Diagnosis present

## 2021-09-12 DIAGNOSIS — E860 Dehydration: Secondary | ICD-10-CM | POA: Diagnosis present

## 2021-09-12 DIAGNOSIS — N99521 Infection of other external stoma of urinary tract: Secondary | ICD-10-CM | POA: Diagnosis present

## 2021-09-12 DIAGNOSIS — I700 Atherosclerosis of aorta: Secondary | ICD-10-CM | POA: Diagnosis present

## 2021-09-12 DIAGNOSIS — Z20822 Contact with and (suspected) exposure to covid-19: Secondary | ICD-10-CM | POA: Diagnosis present

## 2021-09-12 DIAGNOSIS — R6520 Severe sepsis without septic shock: Secondary | ICD-10-CM | POA: Diagnosis present

## 2021-09-12 DIAGNOSIS — I10 Essential (primary) hypertension: Secondary | ICD-10-CM | POA: Diagnosis present

## 2021-09-12 DIAGNOSIS — Z79899 Other long term (current) drug therapy: Secondary | ICD-10-CM

## 2021-09-12 DIAGNOSIS — K219 Gastro-esophageal reflux disease without esophagitis: Secondary | ICD-10-CM | POA: Diagnosis present

## 2021-09-12 DIAGNOSIS — I959 Hypotension, unspecified: Secondary | ICD-10-CM | POA: Diagnosis present

## 2021-09-12 DIAGNOSIS — Z888 Allergy status to other drugs, medicaments and biological substances status: Secondary | ICD-10-CM

## 2021-09-12 DIAGNOSIS — J180 Bronchopneumonia, unspecified organism: Secondary | ICD-10-CM | POA: Diagnosis present

## 2021-09-12 LAB — CMP (CANCER CENTER ONLY)
ALT: 7 U/L (ref 0–44)
AST: 13 U/L — ABNORMAL LOW (ref 15–41)
Albumin: 3 g/dL — ABNORMAL LOW (ref 3.5–5.0)
Alkaline Phosphatase: 78 U/L (ref 38–126)
Anion gap: 13 (ref 5–15)
BUN: 19 mg/dL (ref 6–20)
CO2: 19 mmol/L — ABNORMAL LOW (ref 22–32)
Calcium: 9.1 mg/dL (ref 8.9–10.3)
Chloride: 104 mmol/L (ref 98–111)
Creatinine: 1.81 mg/dL — ABNORMAL HIGH (ref 0.61–1.24)
GFR, Estimated: 43 mL/min — ABNORMAL LOW (ref >60)
Glucose, Bld: 130 mg/dL — ABNORMAL HIGH (ref 70–99)
Potassium: 4.3 mmol/L (ref 3.5–5.1)
Sodium: 136 mmol/L (ref 135–145)
Total Bilirubin: 1.1 mg/dL (ref 0.3–1.2)
Total Protein: 7.6 g/dL (ref 6.5–8.1)

## 2021-09-12 LAB — URINALYSIS, COMPLETE (UACMP) WITH MICROSCOPIC
Bilirubin Urine: NEGATIVE
Glucose, UA: NEGATIVE mg/dL
Ketones, ur: NEGATIVE mg/dL
Nitrite: NEGATIVE
Protein, ur: 100 mg/dL — AB
Specific Gravity, Urine: 1.016 (ref 1.005–1.030)
WBC, UA: >50 WBC/hpf — ABNORMAL HIGH (ref 0–5)
pH: 6 (ref 5.0–8.0)

## 2021-09-12 LAB — CBC WITH DIFFERENTIAL (CANCER CENTER ONLY)
Abs Immature Granulocytes: 1.48 10*3/uL — ABNORMAL HIGH (ref 0.00–0.07)
Basophils Absolute: 0.2 10*3/uL — ABNORMAL HIGH (ref 0.0–0.1)
Basophils Relative: 0 %
Eosinophils Absolute: 0 10*3/uL (ref 0.0–0.5)
Eosinophils Relative: 0 %
HCT: 31.4 % — ABNORMAL LOW (ref 39.0–52.0)
Hemoglobin: 10.3 g/dL — ABNORMAL LOW (ref 13.0–17.0)
Immature Granulocytes: 3 %
Lymphocytes Relative: 2 %
Lymphs Abs: 1.2 10*3/uL (ref 0.7–4.0)
MCH: 30.7 pg (ref 26.0–34.0)
MCHC: 32.8 g/dL (ref 30.0–36.0)
MCV: 93.7 fL (ref 80.0–100.0)
Monocytes Absolute: 2.6 10*3/uL — ABNORMAL HIGH (ref 0.1–1.0)
Monocytes Relative: 4 %
Neutro Abs: 54.9 10*3/uL — ABNORMAL HIGH (ref 1.7–7.7)
Neutrophils Relative %: 91 %
Platelet Count: 471 10*3/uL — ABNORMAL HIGH (ref 150–400)
RBC: 3.35 MIL/uL — ABNORMAL LOW (ref 4.22–5.81)
RDW: 20.3 % — ABNORMAL HIGH (ref 11.5–15.5)
WBC Count: 60.4 10*3/uL (ref 4.0–10.5)
nRBC: 0 % (ref 0.0–0.2)

## 2021-09-12 LAB — RESP PANEL BY RT-PCR (FLU A&B, COVID) ARPGX2
Influenza A by PCR: NEGATIVE
Influenza B by PCR: NEGATIVE
SARS Coronavirus 2 by RT PCR: NEGATIVE

## 2021-09-12 LAB — TSH: TSH: 1.325 u[IU]/mL (ref 0.320–4.118)

## 2021-09-12 MED ORDER — SODIUM CHLORIDE 0.9 % IV SOLN
2.0000 g | INTRAVENOUS | Status: DC
  Administered 2021-09-12: 2 g via INTRAVENOUS
  Filled 2021-09-12: qty 20

## 2021-09-12 MED ORDER — LACTATED RINGERS IV BOLUS
500.0000 mL | Freq: Once | INTRAVENOUS | Status: AC
  Administered 2021-09-12: 500 mL via INTRAVENOUS

## 2021-09-12 MED ORDER — SODIUM CHLORIDE 0.9% FLUSH
10.0000 mL | Freq: Once | INTRAVENOUS | Status: AC
  Administered 2021-09-12: 10 mL

## 2021-09-12 MED ORDER — ONDANSETRON HCL 4 MG/2ML IJ SOLN
4.0000 mg | Freq: Four times a day (QID) | INTRAMUSCULAR | Status: DC | PRN
  Administered 2021-09-15 – 2021-09-16 (×2): 4 mg via INTRAVENOUS
  Filled 2021-09-12 (×2): qty 2

## 2021-09-12 MED ORDER — ACETAMINOPHEN 650 MG RE SUPP: 650.0000 mg | Freq: Four times a day (QID) | RECTAL | Status: DC | PRN

## 2021-09-12 MED ORDER — ACETAMINOPHEN 325 MG PO TABS
650.0000 mg | ORAL_TABLET | Freq: Four times a day (QID) | ORAL | Status: DC | PRN
  Administered 2021-09-12 – 2021-09-13 (×2): 650 mg via ORAL
  Filled 2021-09-12 (×2): qty 2

## 2021-09-12 MED ORDER — SERTRALINE HCL 50 MG PO TABS
50.0000 mg | ORAL_TABLET | Freq: Every day | ORAL | Status: DC
  Administered 2021-09-13 – 2021-09-17 (×5): 50 mg via ORAL
  Filled 2021-09-12 (×5): qty 1

## 2021-09-12 MED ORDER — SODIUM CHLORIDE 0.9 % IV SOLN: Freq: Once | INTRAVENOUS | Status: AC

## 2021-09-12 MED ORDER — LACTATED RINGERS IV SOLN: INTRAVENOUS | Status: AC

## 2021-09-12 MED ORDER — PROCHLORPERAZINE MALEATE 10 MG PO TABS
10.0000 mg | ORAL_TABLET | Freq: Four times a day (QID) | ORAL | Status: AC | PRN
Start: 2021-09-12 — End: 2021-09-13
  Administered 2021-09-12 – 2021-09-13 (×2): 10 mg via ORAL
  Filled 2021-09-12 (×2): qty 1

## 2021-09-12 MED ORDER — ENOXAPARIN SODIUM 40 MG/0.4ML IJ SOSY
40.0000 mg | PREFILLED_SYRINGE | INTRAMUSCULAR | Status: DC
  Administered 2021-09-12 – 2021-09-16 (×4): 40 mg via SUBCUTANEOUS
  Filled 2021-09-12 (×4): qty 0.4

## 2021-09-12 MED ORDER — PRAVASTATIN SODIUM 20 MG PO TABS
40.0000 mg | ORAL_TABLET | Freq: Every day | ORAL | Status: DC
  Administered 2021-09-13 – 2021-09-17 (×5): 40 mg via ORAL
  Filled 2021-09-12 (×5): qty 2

## 2021-09-12 MED ORDER — IBUPROFEN 100 MG/5ML PO SUSP: 200.0000 mg | Freq: Once | ORAL | Status: DC

## 2021-09-12 MED ORDER — IBUPROFEN 100 MG/5ML PO SUSP
200.0000 mg | Freq: Four times a day (QID) | ORAL | Status: DC
  Administered 2021-09-12: 200 mg via ORAL
  Filled 2021-09-12 (×2): qty 10

## 2021-09-12 MED ORDER — SENNOSIDES-DOCUSATE SODIUM 8.6-50 MG PO TABS: 1.0000 | ORAL_TABLET | Freq: Every evening | ORAL | Status: DC | PRN

## 2021-09-12 MED ORDER — ONDANSETRON HCL 4 MG PO TABS
4.0000 mg | ORAL_TABLET | Freq: Four times a day (QID) | ORAL | Status: DC | PRN
  Administered 2021-09-14 – 2021-09-16 (×2): 4 mg via ORAL
  Filled 2021-09-12 (×2): qty 1

## 2021-09-12 MED ORDER — PANTOPRAZOLE SODIUM 40 MG PO TBEC
40.0000 mg | DELAYED_RELEASE_TABLET | Freq: Every day | ORAL | Status: DC
  Administered 2021-09-13 – 2021-09-17 (×5): 40 mg via ORAL
  Filled 2021-09-12 (×5): qty 1

## 2021-09-12 NOTE — Plan of Care (Signed)
Consult for direct admission from [facility] to [facility] hospital. Diagnosis of sepsis, present on admission likely secondary to UTI. Patient with history of 24 hours of fever, chills, nausea and vomiting. Complicated by ileal conduit. Labs most significant for leukocytosis of 60,400, ANC of 54,900, creatinine of 1.81, urinalysis with positive leukocyte, many bacteria, +RBC/WBC. Given 1 L of NS via IV bolus in the cancer center. Blood cultures obtained x2.  Vitals:   97.2 F (36.2 C) Abnormal  97.2 F (36.2 C) Abnormal   Pulse Rate 100 100  Resp 18 18  BP: Systolic 92 92  BP: Diastolic 60 60  SpO2 98 % 98 %   Recommended 2 g dose of IV/IM Ceftriaxone prior to transfer to the floor if possible and a urine culture. Admit to med-surg for sepsis workup with concern for possible gram negative bacteremia.  Cordelia Poche, MD [facility] 09/12/2021, 5:30 PM

## 2021-09-12 NOTE — Telephone Encounter (Signed)
-----   Message from Wyatt Portela, MD sent at 09/12/2021  9:42 AM EDT ----- Symptom management clinic. Thanks  ----- Message ----- From: Rolene Course, RN Sent: 09/12/2021   9:22 AM EDT To: Wyatt Portela, MD  Ms Niblett called & said that patient missed 3 days of work last week due to weakness, nausea & vomiting, he is taking his compazine which does help.  Last night he started vomiting, having hot & cold flashes & had a temp of 102 during the night.  She gave him tylenol at 0530 & his temp is 100 currently.  I told her you will probably want him to be seen.  What is your advice?  Thanks, Bethena Roys

## 2021-09-12 NOTE — H&P (Signed)
History and Physical    UNIQUE SEARFOSS ALP:[id number] DOB: 06-26-1961 DOA: 09/12/2021  PCP: [name], DO  Patient coming from: Direct admission from [facility] center  I have personally briefly reviewed patient's old medical records in [facility]  Chief Complaint: Fevers, leukocytosis  HPI: Barry Gibson is a 60 y.o. male with medical history significant for stage IV bladder cancer s/p robotic assisted laparoscopic radical cystectomy and lymphadenectomy January 2022 with ileal conduit, hypertension, rheumatoid arthritis who presented to the cancer center for evaluation of fevers, nausea, vomiting.  Patient states 1 week ago he had several days of nausea, vomiting, fatigue which caused him to miss work.  Symptoms were improving and he was beginning to feel better until evening of 10/25.  He had developed fevers up to 102 F at home, chills, diaphoresis, nausea/vomiting with inability to maintain adequate oral intake, and decreased urine output from his ileal conduit.  He was not having any abdominal pain, loose stools, or noticed any skin changes.  He is taking negative home COVID test daily since start of his symptoms.  He recently completed chemotherapy with gemcitabine and cisplatin and received first dose of immunotherapy with Keytruda on 10/21. He went to his oncology clinic for further evaluation of his symptoms.  He was noted to be tachycardic and hypotensive.  Labs were obtained and significant for WBC 60.4, hemoglobin 10.3, platelets 471,000, sodium 136, potassium 4.3, bicarb 19, BUN 19, creatinine 1.81 (baseline 1.2), serum glucose 130, AST 13, LT 7, alk phos 78, total bilirubin 1.1, TSH 1.325.  Urinalysis showed negative nitrates, large leukocytes, 11-20 RBC/hpf, >50 WBC/hpf, many bacteria microscopy.  Blood and urine cultures were obtained and in process.  2 view chest x-ray showed unchanged right chest wall catheter without focal consolidation, edema,  effusion.  Patient was given 1 L normal saline.  The hospitalist service was consulted for direct admission for further evaluation and management.   Review of Systems: All systems reviewed and are negative except as documented in history of present illness above.   Past Medical History:  Diagnosis Date   Cancer [facility])    bladder   Cataract    COVID-19 11/13/2019   h/a, chills, fatigue, loss of taste and smell, all symptoms resolved in a few weeks   Frequent urination    Fuchs' corneal dystrophy    Full dentures    GERD (gastroesophageal reflux disease)    Hypertension    Rheumatoid arthritis ([place])     Past Surgical History:  Procedure Laterality Date   CORNEAL TRANSPLANT Left 2013   CYSTOSCOPY WITH BIOPSY N/A 09/14/2019   Procedure: CYSTOSCOPY WITH BIOPSY;  Surgeon: Franchot Gallo, MD;  Location: [facility];  Service: Urology;  Laterality: N/A;   CYSTOSCOPY WITH INJECTION N/A 11/29/2020   Procedure: CYSTOSCOPY WITH INJECTION OF INDOCYANINE GREEN DYE;  Surgeon: Alexis Frock, MD;  Location: [facility];  Service: Urology;  Laterality: N/A;  6 HRS   FULGURATION OF BLADDER TUMOR N/A 09/14/2019   Procedure: FULGURATION OF BLADDER TUMOR;  Surgeon: Franchot Gallo, MD;  Location: [facility];  Service: Urology;  Laterality: N/A;   IR IMAGING GUIDED PORT INSERTION  06/07/2021   IR NEPHROSTOMY EXCHANGE LEFT  10/30/2020   IR NEPHROSTOMY EXCHANGE LEFT  11/19/2020   IR NEPHROSTOMY EXCHANGE RIGHT  10/30/2020   IR NEPHROSTOMY EXCHANGE RIGHT  11/19/2020   IR NEPHROSTOMY PLACEMENT LEFT  09/27/2020   IR NEPHROSTOMY PLACEMENT RIGHT  09/27/2020   ROBOT ASSISTED LAPAROSCOPIC COMPLETE CYSTECT  ILEAL CONDUIT N/A 11/29/2020   Procedure: XI ROBOTIC ASSISTED LAPAROSCOPIC COMPLETE CYSTECT ILEAL CONDUIT/RADICAL PROSTATECTOMY/LYMPHADENECTOMY;  Surgeon: Alexis Frock, MD;  Location: [facility];  Service: Urology;  Laterality: N/A;   TRANSURETHRAL RESECTION OF BLADDER TUMOR WITH MITOMYCIN-C N/A 08/05/2019   Procedure:  TRANSURETHRAL RESECTION OF BLADDER TUMOR;  Surgeon: Franchot Gallo, MD;  Location: [facility];  Service: Urology;  Laterality: N/A;  1 HR   TRANSURETHRAL RESECTION OF BLADDER TUMOR WITH MITOMYCIN-C N/A 04/24/2020   Procedure: TRANSURETHRAL RESECTION OF BLADDER TUMOR WITH GEMCIDABINE;  Surgeon: Franchot Gallo, MD;  Location: [facility];  Service: Urology;  Laterality: N/A;    Social History:  reports that he has quit smoking. His smoking use included cigarettes. He has a 10.00 pack-year smoking history. He has never used smokeless tobacco. He reports current alcohol use. He reports that he does not use drugs.  Allergies  Allergen Reactions   Ace Inhibitors Cough   Diflucan [Fluconazole] Other (See Comments)    Irritated ulcers    No family history on file.   Prior to Admission medications   Medication Sig Start Date End Date Taking? Authorizing Provider  acetaminophen (TYLENOL) 650 MG CR tablet Take 1,300 mg by mouth every morning.    [provider]  amLODipine (NORVASC) 10 MG tablet Take 1 tablet (10 mg total) by mouth daily. 05/07/21   Elvia Collum M, DO  lidocaine-prilocaine (EMLA) cream Apply 1 application topically as needed. 05/18/21   Wyatt Portela, MD  omeprazole (PRILOSEC) 40 MG capsule Take 40 mg by mouth daily.    [provider]  pravastatin (PRAVACHOL) 40 MG tablet TAKE 1 TABLET(40 MG) BY MOUTH DAILY 05/07/21   Lovena Le, Malena M, DO  prednisoLONE acetate (PRED FORTE) 1 % ophthalmic suspension Place 1 drop into the left eye 2 (two) times a week. Instill 1 drop into left eye Monday and Friday.    [provider]  prochlorperazine (COMPAZINE) 10 MG tablet TAKE 1 TABLET(10 MG) BY MOUTH EVERY 6 HOURS AS NEEDED FOR NAUSEA OR VOMITING 07/24/21   Wyatt Portela, MD  sertraline (ZOLOFT) 50 MG tablet TAKE 1 TABLET(50 MG) BY MOUTH DAILY 08/06/21   [name], DO    Physical Exam: Vitals:   09/12/21 1830 09/12/21  1842  BP:  137/67  Pulse:  (!) 103  Resp:  16  Temp:  98.4 F (36.9 C)  TempSrc:  Oral  SpO2:  98%  Weight: 89.2 kg   Height: '5\' 11"'  (1.803 m)    Constitutional: Resting supine in bed, NAD, calm, comfortable Eyes: PERRL, lids and conjunctivae normal ENMT: Mucous membranes are dry. Posterior pharynx clear of any exudate or lesions.Normal dentition.  Neck: normal, supple, no masses. Respiratory: clear to auscultation bilaterally, no wheezing, no crackles. Normal respiratory effort. No accessory muscle use.  Cardiovascular: Tachycardic, no murmurs / rubs / gallops. No extremity edema. 2+ pedal pulses.  Port-A-Cath in place right upper chest wall without surrounding erythema, swelling, or tenderness. Abdomen: no tenderness, no masses palpated. No hepatosplenomegaly. Bowel sounds positive.  Ileal conduit with ostomy in place without surrounding signs of infection, amber-colored urine in bag. Musculoskeletal: no clubbing / cyanosis. No joint deformity upper and lower extremities. Good ROM, no contractures. Normal muscle tone.  Skin: Diaphoretic, no rashes, lesions, ulcers. No induration Neurologic: CN 2-12 grossly intact. Sensation intact. Strength 5/5 in all 4.  Psychiatric: Normal judgment and insight. Alert and oriented x 3. Normal mood.   Labs on Admission: I  have personally reviewed following labs and imaging studies  CBC: Recent Labs  Lab 09/07/21 1240 09/12/21 1254  WBC 10.9* 60.4*  NEUTROABS 6.2 54.9*  HGB 9.1* 10.3*  HCT 28.2* 31.4*  MCV 94.0 93.7  PLT 538* 093*   Basic Metabolic Panel: Recent Labs  Lab 09/07/21 1240 09/12/21 1254  NA 138 136  K 4.0 4.3  CL 109 104  CO2 22 19*  GLUCOSE 133* 130*  BUN 21* 19  CREATININE 1.42* 1.81*  CALCIUM 8.5* 9.1   GFR: Estimated Creatinine Clearance: 47.4 mL/min (A) (by C-G formula based on SCr of 1.81 mg/dL (H)). Liver Function Tests: Recent Labs  Lab 09/07/21 1240 09/12/21 1254  AST 14* 13*  ALT 10 7  ALKPHOS 83 78   BILITOT 0.3 1.1  PROT 7.3 7.6  ALBUMIN 3.1* 3.0*   No results for input(s): LIPASE, AMYLASE in the last 168 hours. No results for input(s): AMMONIA in the last 168 hours. Coagulation Profile: No results for input(s): INR, PROTIME in the last 168 hours. Cardiac Enzymes: No results for input(s): CKTOTAL, CKMB, CKMBINDEX, TROPONINI in the last 168 hours. BNP (last 3 results) No results for input(s): PROBNP in the last 8760 hours. HbA1C: No results for input(s): HGBA1C in the last 72 hours. CBG: No results for input(s): GLUCAP in the last 168 hours. Lipid Profile: No results for input(s): CHOL, HDL, LDLCALC, TRIG, CHOLHDL, LDLDIRECT in the last 72 hours. Thyroid Function Tests: Recent Labs    09/12/21 1254  TSH 1.325   Anemia Panel: No results for input(s): VITAMINB12, FOLATE, FERRITIN, TIBC, IRON, RETICCTPCT in the last 72 hours. Urine analysis:    Component Value Date/Time   COLORURINE YELLOW 09/12/2021 1254   APPEARANCEUR CLOUDY (A) 09/12/2021 1254   APPEARANCEUR Hazy (A) 09/22/2020 1521   LABSPEC 1.016 09/12/2021 1254   PHURINE 6.0 09/12/2021 1254   GLUCOSEU NEGATIVE 09/12/2021 1254   HGBUR SMALL (A) 09/12/2021 1254   BILIRUBINUR NEGATIVE 09/12/2021 1254   BILIRUBINUR Negative 09/22/2020 1521   KETONESUR NEGATIVE 09/12/2021 1254   PROTEINUR 100 (A) 09/12/2021 1254   UROBILINOGEN 0.2 06/09/2020 1536   NITRITE NEGATIVE 09/12/2021 1254   LEUKOCYTESUR LARGE (A) 09/12/2021 1254    Radiological Exams on Admission: DG Chest 2 View  Result Date: 09/12/2021 CLINICAL DATA:  Fever.  Cancer patient. EXAM: CHEST - 2 VIEW COMPARISON:  CT chest dated August 31, 2021. FINDINGS: Unchanged right chest wall catheter. The heart size and mediastinal contours are within normal limits. Normal pulmonary vascularity. No focal consolidation, pleural effusion, or pneumothorax. No acute osseous abnormality. IMPRESSION: No active cardiopulmonary disease. Electronically Signed   By: Titus Dubin M.D.   On: 09/12/2021 14:05    EKG: Not performed.  Assessment/Plan Principal Problem:   Severe sepsis with acute organ dysfunction [facility]) Active Problems:   Bladder cancer ([place])   AKI (acute kidney injury) ([place])   Acute lower UTI   Barry Gibson is a 60 y.o. male with medical history significant for stage IV bladder cancer s/p robotic assisted laparoscopic radical cystectomy and lymphadenectomy January 2022 with ileal conduit, hypertension, rheumatoid arthritis who is admitted with severe sepsis suspected secondary to UTI.  Severe sepsis secondary to UTI: Patient presenting with tachycardia (pulse 103), leukocytosis (WBC 60.4), and evidence of acute organ dysfunction with AKI with urinalysis consistent with UTI. -Start empiric IV ceftriaxone -Follow blood and urine cultures collected in 10/26 -Start on maintenance IV fluids overnight -Check COVID/influenza  Acute kidney injury: Likely prerenal secondary to GI  losses and sepsis/UTI.  Continue IV fluids and repeat labs in AM.  Stage IV bladder cancer s/p radical cystectomy and lymphadenectomy January 2022 with ileal conduit: Follows with oncology, Dr. Alen Blew.  Recently started on Keytruda.  Continue ostomy care.  Hypertension: Holding home meds due to initially low blood pressures.  Hyperlipidemia: Continue statin.  Depression/anxiety: Continue sertraline.  DVT prophylaxis: Lovenox Code Status: Full code, confirmed with patient on admission Family Communication: Discussed with patient's spouse at bedside Disposition Plan: From home and likely discharge to home pending clinical progress Consults called: None Level of care: Med-Surg Admission status:  Status is: Inpatient  Remains inpatient appropriate because: Requiring IV antibiotics, fluids and close monitoring of vitals/labs in the setting of severe sepsis pending further cultural data.  Zada Finders MD [facility]  If 7PM-7AM, please contact  night-coverage [email or web address]  09/12/2021, 7:51 PM

## 2021-09-12 NOTE — Telephone Encounter (Signed)
PC to patient's wife Vergia Alcon, informed her Dr. Alen Blew wants patient seen in [facility].  Informed her CXR & lab tests have been ordered, he is to go to [facility] for CXR, then here to [facility] for flush & lab appointment & then [facility] appointment at 1:00.  She verbalizes understanding.

## 2021-09-12 NOTE — Progress Notes (Signed)
Symptom Management Consult note [facility]   Telephone:(336) [phone] [phone]    Patient Care Team: [name], DO as PCP - General (Family Medicine)    Name of the patient: Barry Gibson  [id number]  [birth year]   Date of visit: 09/12/2021    Chief complaint/ Reason for visit- fever and chills, nausea, vomiting  Oncology History  Bladder cancer ([place])  03/06/2020 Initial Diagnosis   Bladder cancer ([place])   05/18/2021 Cancer Staging   Staging form: Urinary Bladder, AJCC 8th Edition - Clinical: Stage IVB (cT4, cN2, cM1b) - Signed by Wyatt Portela, MD on 05/18/2021 WHO/ISUP grade (low/high): High Grade Histologic grading system: 2 grade system    06/08/2021 - 08/17/2021 Chemotherapy   Patient is on Treatment Plan : BLADDER Cisplatin D1 + Gemcitabine D1,8 q21d x 6 Cycles     09/07/2021 -  Chemotherapy   Patient is on Treatment Plan : HEAD/NECK Pembrolizumab Q21D       Current Therapy: Pembrolizumab cycle 1, last infusion 09/07/21. Followed by oncologist Dr. Alen Blew.  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  Patient with T-max of 102 last night.  Vomiting and chills started last night as well.  He took Tylenol this morning just prior to arrival.  Patient has noticed increase urine output in ileal conduit and that urine is dark in color. He admits to approximately 10 episodes of nonbloody nonbilious emesis today.  He has been unable to tolerate p.o. intake besides minimal sips of water.  Denies any sick contacts or known COVID exposures.  Has taken negative home COVID tests daily since symptoms started.     Review of Systems  Constitutional:  Positive for chills and fever.  HENT:  Negative for ear pain and sinus pain.   Respiratory:  Negative for cough, shortness of breath  and wheezing.   Cardiovascular:  Negative for chest pain and leg swelling.  Gastrointestinal:  Positive for nausea and vomiting. Negative for abdominal pain and diarrhea.  Genitourinary:  Positive for frequency. Negative for flank pain, hematuria and urgency.  Musculoskeletal:  Negative for joint pain and myalgias.  Skin:  Negative for rash.  Neurological:  Negative for dizziness and weakness.  Endo/Heme/Allergies:  Does not bruise/bleed easily.  Psychiatric/Behavioral:  The patient is not nervous/anxious.       Allergies  Allergen Reactions   Ace Inhibitors Cough   Diflucan [Fluconazole] Other (See Comments)    Irritated ulcers     Past Medical History:  Diagnosis Date   Cancer ([place])    bladder   Cataract    COVID-19 11/13/2019   h/a, chills, fatigue, loss of taste and smell, all symptoms resolved in a few weeks   Frequent urination    Fuchs' corneal dystrophy    Full dentures    GERD (gastroesophageal reflux disease)    Hypertension    Rheumatoid arthritis ([place])      Past Surgical History:  Procedure Laterality Date   CORNEAL TRANSPLANT Left 2013   CYSTOSCOPY WITH BIOPSY N/A 09/14/2019   Procedure: CYSTOSCOPY WITH BIOPSY;  Surgeon: Franchot Gallo, MD;  Location: [facility];  Service: Urology;  Laterality: N/A;   CYSTOSCOPY WITH INJECTION N/A 11/29/2020   Procedure: CYSTOSCOPY WITH INJECTION OF INDOCYANINE GREEN DYE;  Surgeon: Alexis Frock, MD;  Location: [facility];  Service: Urology;  Laterality:  N/A;  6 HRS   FULGURATION OF BLADDER TUMOR N/A 09/14/2019   Procedure: FULGURATION OF BLADDER TUMOR;  Surgeon: Franchot Gallo, MD;  Location: [facility];  Service: Urology;  Laterality: N/A;   IR IMAGING GUIDED PORT INSERTION  06/07/2021   IR NEPHROSTOMY EXCHANGE LEFT  10/30/2020   IR NEPHROSTOMY EXCHANGE LEFT  11/19/2020   IR NEPHROSTOMY EXCHANGE RIGHT  10/30/2020   IR NEPHROSTOMY EXCHANGE RIGHT  11/19/2020   IR NEPHROSTOMY PLACEMENT LEFT  09/27/2020   IR NEPHROSTOMY PLACEMENT  RIGHT  09/27/2020   ROBOT ASSISTED LAPAROSCOPIC COMPLETE CYSTECT ILEAL CONDUIT N/A 11/29/2020   Procedure: XI ROBOTIC ASSISTED LAPAROSCOPIC COMPLETE CYSTECT ILEAL CONDUIT/RADICAL PROSTATECTOMY/LYMPHADENECTOMY;  Surgeon: Alexis Frock, MD;  Location: [facility];  Service: Urology;  Laterality: N/A;   TRANSURETHRAL RESECTION OF BLADDER TUMOR WITH MITOMYCIN-C N/A 08/05/2019   Procedure: TRANSURETHRAL RESECTION OF BLADDER TUMOR;  Surgeon: Franchot Gallo, MD;  Location: [facility];  Service: Urology;  Laterality: N/A;  1 HR   TRANSURETHRAL RESECTION OF BLADDER TUMOR WITH MITOMYCIN-C N/A 04/24/2020   Procedure: TRANSURETHRAL RESECTION OF BLADDER TUMOR WITH GEMCIDABINE;  Surgeon: Franchot Gallo, MD;  Location: [facility];  Service: Urology;  Laterality: N/A;    Social History   Socioeconomic History   Marital status: Married    Spouse name: Not on file   Number of children: Not on file   Years of education: Not on file   Highest education level: Not on file  Occupational History   Not on file  Tobacco Use   Smoking status: Former    Packs/day: 0.50    Years: 20.00    Pack years: 10.00    Types: Cigarettes   Smokeless tobacco: Never   Tobacco comments:    quit 2010  Vaping Use   Vaping Use: Never used  Substance and Sexual Activity   Alcohol use: Yes    Comment: occ  2 x month   Drug use: No   Sexual activity: Yes  Other Topics Concern   Not on file  Social History Narrative   Not on file   Social Determinants of Health   Financial Resource Strain: Not on file  Food Insecurity: Not on file  Transportation Needs: Not on file  Physical Activity: Not on file  Stress: Not on file  Social Connections: Not on file  Intimate Partner Violence: Not on file    No family history on file.   Current Outpatient Medications:    acetaminophen (TYLENOL) 650 MG CR tablet, Take 1,300 mg by mouth every morning., Disp: , Rfl:    amLODipine (NORVASC) 10 MG  tablet, Take 1 tablet (10 mg total) by mouth daily., Disp: 90 tablet, Rfl: 1   lidocaine-prilocaine (EMLA) cream, Apply 1 application topically as needed., Disp: 30 g, Rfl: 0   omeprazole (PRILOSEC) 40 MG capsule, Take 40 mg by mouth daily., Disp: , Rfl:    pravastatin (PRAVACHOL) 40 MG tablet, TAKE 1 TABLET(40 MG) BY MOUTH DAILY, Disp: 90 tablet, Rfl: 1   prednisoLONE acetate (PRED FORTE) 1 % ophthalmic suspension, Place 1 drop into the left eye 2 (two) times a week. Instill 1 drop into left eye Monday and Friday., Disp: , Rfl:    prochlorperazine (COMPAZINE) 10 MG tablet, TAKE 1 TABLET(10 MG) BY MOUTH EVERY 6 HOURS AS NEEDED FOR NAUSEA OR VOMITING, Disp: 90 tablet, Rfl: 2   sertraline (ZOLOFT) 50 MG tablet, TAKE 1 TABLET(50 MG) BY MOUTH DAILY, Disp: 90 tablet, Rfl: 0 No current  facility-administered medications for this visit.  Facility-Administered Medications Ordered in Other Visits:    acetaminophen (TYLENOL) 325 MG tablet, , , ,    diphenhydrAMINE (BENADRYL) 25 mg capsule, , , ,    gemcitabine (GEMZAR) chemo syringe for bladder instillation 2,000 mg, 2,000 mg, Bladder Instillation, Once, Franchot Gallo, MD   gemcitabine [facility]) chemo syringe for bladder instillation 2,000 mg, 2,000 mg, Bladder Instillation, Once, Franchot Gallo, MD  PHYSICAL EXAM: ECOG FS:1 - Symptomatic but completely ambulatory    Vitals:   09/12/21 1443  BP: 92/60  Pulse: 100  Resp: 18  Temp: (!) 97.2 F (36.2 C)  TempSrc: Oral  SpO2: 98%  Weight: 199 lb 8 oz (90.5 kg)   Physical Exam Vitals and nursing note reviewed.  Constitutional:      General: He is not in acute distress.    Appearance: He is ill-appearing and diaphoretic.  HENT:     Head: Normocephalic and atraumatic.     Right Ear: External ear normal.     Left Ear: External ear normal.     Nose: Nose normal.     Mouth/Throat:     Mouth: Mucous membranes are dry.     Pharynx: No oropharyngeal exudate or posterior oropharyngeal  erythema.  Eyes:     General: No scleral icterus.    Conjunctiva/sclera: Conjunctivae normal.     Pupils: Pupils are equal, round, and reactive to light.  Cardiovascular:     Rate and Rhythm: Regular rhythm. Tachycardia present.     Pulses: Normal pulses.     Heart sounds: Normal heart sounds.     Comments: Heart rate 104 at time of exam Pulmonary:     Effort: Pulmonary effort is normal.     Breath sounds: Normal breath sounds.  Chest:     Comments: Port in right upper chest without signs of infection Abdominal:     Comments: Ileal conduit present. No surrounding signs of infection. Dark urine seen in bag.   Abdomen is soft, non distended. No CVA tenderness. No peritoneal signs. Normoactive bowel sounds.  Musculoskeletal:        General: Normal range of motion.     Cervical back: Normal range of motion.  Lymphadenopathy:     Cervical: No cervical adenopathy.  Skin:    Capillary Refill: Capillary refill takes less than 2 seconds.     Coloration: Skin is pale.  Neurological:     General: No focal deficit present.     Mental Status: He is oriented to person, place, and time.  Psychiatric:        Mood and Affect: Mood normal.       LABORATORY DATA: I have reviewed the data as listed CBC Latest Ref Rng & Units 09/12/2021 09/07/2021 08/17/2021  WBC 4.0 - 10.5 K/uL 60.4(HH) 10.9(H) 5.5  Hemoglobin 13.0 - 17.0 g/dL 10.3(L) 9.1(L) 9.5(L)  Hematocrit 39.0 - 52.0 % 31.4(L) 28.2(L) 28.7(L)  Platelets 150 - 400 K/uL 471(H) 538(H) 311     CMP Latest Ref Rng & Units 09/12/2021 09/07/2021 08/17/2021  Glucose 70 - 99 mg/dL 130(H) 133(H) 126(H)  BUN 6 - 20 mg/dL 19 21(H) 20  Creatinine 0.61 - 1.24 mg/dL 1.81(H) 1.42(H) 1.23  Sodium 135 - 145 mmol/L 136 138 141  Potassium 3.5 - 5.1 mmol/L 4.3 4.0 3.8  Chloride 98 - 111 mmol/L 104 109 107  CO2 22 - 32 mmol/L 19(L) 22 24  Calcium 8.9 - 10.3 mg/dL 9.1 8.5(L) 8.9  Total Protein 6.5 -  8.1 g/dL 7.6 7.3 6.9  Total Bilirubin 0.3 - 1.2  mg/dL 1.1 0.3 0.3  Alkaline Phos 38 - 126 U/L 78 83 82  AST 15 - 41 U/L 13(L) 14(L) 17  ALT 0 - 44 U/L 7 10 11        RADIOGRAPHIC STUDIES: I have personally reviewed the radiological images as listed and agreed with the findings in the report. No images are attached to the encounter. DG Chest 2 View  Result Date: 09/12/2021 CLINICAL DATA:  Fever.  Cancer patient. EXAM: CHEST - 2 VIEW COMPARISON:  CT chest dated August 31, 2021. FINDINGS: Unchanged right chest wall catheter. The heart size and mediastinal contours are within normal limits. Normal pulmonary vascularity. No focal consolidation, pleural effusion, or pneumothorax. No acute osseous abnormality. IMPRESSION: No active cardiopulmonary disease. Electronically Signed   By: Titus Dubin M.D.   On: 09/12/2021 14:05   CT Chest W Contrast  Result Date: 09/03/2021 CLINICAL DATA:  Urologic cancer, assess treatment response in a 60 year old male. EXAM: CT CHEST, ABDOMEN, AND PELVIS WITH CONTRAST TECHNIQUE: Multidetector CT imaging of the chest, abdomen and pelvis was performed following the standard protocol during bolus administration of intravenous contrast. CONTRAST:  153mL OMNIPAQUE IOHEXOL 350 MG/ML SOLN COMPARISON:  April 19, 2021. FINDINGS: CT CHEST FINDINGS Cardiovascular: RIGHT-sided Port-A-Cath in situ terminates at the caval to atrial junction. Normal heart size without substantial pericardial effusion. Calcified coronary artery disease. Calcified aortic atherosclerosis without aneurysm in the chest. Central pulmonary vasculature is normal caliber. Mediastinum/Nodes: No thoracic inlet, axillary, mediastinal or hilar adenopathy. Esophagus grossly normal. Lungs/Pleura: Lungs are clear.  Airways are patent. Musculoskeletal: See below for full musculoskeletal details. CT ABDOMEN PELVIS FINDINGS Hepatobiliary: No focal, suspicious hepatic lesion. No pericholecystic stranding. No biliary duct dilation. Portal vein is patent. Cholelithiasis  as before. Pancreas: Normal, without mass, inflammation or ductal dilatation. Spleen: Normal spleen. Adrenals/Urinary Tract: Adrenal glands are normal. Symmetric renal enhancement with small cyst in the upper pole the RIGHT kidney. No hydronephrosis. Post cysto prostatectomy. Small cyst in the interpolar LEFT kidney and another arising from the lower pole. Reflux of air into the collecting systems of the kidneys by way of the conduit and ureters likely related to prone positioning and retrograde passage of gas from the patient's ostomy bag into the urinary diversion. No perinephric stranding. Stomach/Bowel: Normal appendix. Colonic diverticulosis. Signs of small bowel resection for creation of ileal conduit as before. Vascular/Lymphatic: Resolution of adenopathy since the prior study in the retroperitoneum. (Image 83/3) 5 mm intra-aortocaval lymph node previously 15 mm. Similar change in size of a second intra-aortocaval lymph node adjacent to a retroaortic LEFT renal vein on image 84/3, currently approximately 4 mm previously approximately 15 mm. Small LEFT retrocrural lymph node (image 72/3) 5 mm previously 10 mm. Other lymph nodes with enlargement on the prior study show similar response to therapy without new signs of nodal disease. Aortic atherosclerotic changes with soft plaque in the infrarenal abdominal aorta which shows aneurysmal dilation measuring approximately 3.7 x 3.7 cm on the current study and 3.7 x 3.6 cm when measured by this observer in a similar fashion on the prior exam. Atherosclerotic plaque extends into branch vessels in the abdomen extending also in the iliac vessels. Note that the abdominal aorta measured approximally 3.6 x 3.4 cm greatest caliber in November of 2021. The IVC shows a smooth contour. Reproductive: Post cystoprostatectomy as above. Other: No ascites. Musculoskeletal: No acute musculoskeletal process. Spinal degenerative changes. IMPRESSION: Marked interval response to therapy  as above without new or progressive findings. No evidence of metastatic disease to the chest. Post cystoprostatectomy and creation of urinary diversion. Reflux of air into the collecting systems with prone positioning may be of little clinical significance, more likely related to positioning and reflux of air from the ostomy bag into the urinary diversion. Correlate with any urinary symptoms and with urinalysis as warranted. Slight increase in size of and soft plaque within an infrarenal abdominal aortic aneurysm. Recommend follow-up every 2 years. This recommendation follows ACR consensus guidelines: White Paper of the ACR Incidental Findings Committee II on Vascular Findings. J Am Coll Radiol 2013; 10:789-794. Cholelithiasis. Aortic Atherosclerosis (ICD10-I70.0). Electronically Signed   By: Zetta Bills M.D.   On: 09/03/2021 19:11   CT Abdomen Pelvis W Contrast  Result Date: 09/03/2021 CLINICAL DATA:  Urologic cancer, assess treatment response in a 60 year old male. EXAM: CT CHEST, ABDOMEN, AND PELVIS WITH CONTRAST TECHNIQUE: Multidetector CT imaging of the chest, abdomen and pelvis was performed following the standard protocol during bolus administration of intravenous contrast. CONTRAST:  114mL OMNIPAQUE IOHEXOL 350 MG/ML SOLN COMPARISON:  April 19, 2021. FINDINGS: CT CHEST FINDINGS Cardiovascular: RIGHT-sided Port-A-Cath in situ terminates at the caval to atrial junction. Normal heart size without substantial pericardial effusion. Calcified coronary artery disease. Calcified aortic atherosclerosis without aneurysm in the chest. Central pulmonary vasculature is normal caliber. Mediastinum/Nodes: No thoracic inlet, axillary, mediastinal or hilar adenopathy. Esophagus grossly normal. Lungs/Pleura: Lungs are clear.  Airways are patent. Musculoskeletal: See below for full musculoskeletal details. CT ABDOMEN PELVIS FINDINGS Hepatobiliary: No focal, suspicious hepatic lesion. No pericholecystic stranding. No  biliary duct dilation. Portal vein is patent. Cholelithiasis as before. Pancreas: Normal, without mass, inflammation or ductal dilatation. Spleen: Normal spleen. Adrenals/Urinary Tract: Adrenal glands are normal. Symmetric renal enhancement with small cyst in the upper pole the RIGHT kidney. No hydronephrosis. Post cysto prostatectomy. Small cyst in the interpolar LEFT kidney and another arising from the lower pole. Reflux of air into the collecting systems of the kidneys by way of the conduit and ureters likely related to prone positioning and retrograde passage of gas from the patient's ostomy bag into the urinary diversion. No perinephric stranding. Stomach/Bowel: Normal appendix. Colonic diverticulosis. Signs of small bowel resection for creation of ileal conduit as before. Vascular/Lymphatic: Resolution of adenopathy since the prior study in the retroperitoneum. (Image 83/3) 5 mm intra-aortocaval lymph node previously 15 mm. Similar change in size of a second intra-aortocaval lymph node adjacent to a retroaortic LEFT renal vein on image 84/3, currently approximately 4 mm previously approximately 15 mm. Small LEFT retrocrural lymph node (image 72/3) 5 mm previously 10 mm. Other lymph nodes with enlargement on the prior study show similar response to therapy without new signs of nodal disease. Aortic atherosclerotic changes with soft plaque in the infrarenal abdominal aorta which shows aneurysmal dilation measuring approximately 3.7 x 3.7 cm on the current study and 3.7 x 3.6 cm when measured by this observer in a similar fashion on the prior exam. Atherosclerotic plaque extends into branch vessels in the abdomen extending also in the iliac vessels. Note that the abdominal aorta measured approximally 3.6 x 3.4 cm greatest caliber in November of 2021. The IVC shows a smooth contour. Reproductive: Post cystoprostatectomy as above. Other: No ascites. Musculoskeletal: No acute musculoskeletal process. Spinal  degenerative changes. IMPRESSION: Marked interval response to therapy as above without new or progressive findings. No evidence of metastatic disease to the chest. Post cystoprostatectomy and creation of urinary diversion. Reflux of air  into the collecting systems with prone positioning may be of little clinical significance, more likely related to positioning and reflux of air from the ostomy bag into the urinary diversion. Correlate with any urinary symptoms and with urinalysis as warranted. Slight increase in size of and soft plaque within an infrarenal abdominal aortic aneurysm. Recommend follow-up every 2 years. This recommendation follows ACR consensus guidelines: White Paper of the ACR Incidental Findings Committee II on Vascular Findings. J Am Coll Radiol 2013; 10:789-794. Cholelithiasis. Aortic Atherosclerosis (ICD10-I70.0). Electronically Signed   By: Zetta Bills M.D.   On: 09/03/2021 19:11     ASSESSMENT & PLAN: Patient is a 60 y.o. male with stage IV (T4N2) high-grade urothelial carcinoma of the bladder diagnosed in 2022 followed by Dr. Alen Blew.   #fever and chills- Patient afebrile on arrival suspect because he had taken tylenol PTA. Labs collected prior to evaluation in conjunction in vitals are concerning for sepsis. Patient has acute leukocytosis 60. CMP shows no severe electrolyte derangement, does have bump in creatinine at 1.81 compared to baseline appearing to be around 1.2, BUN normal at 19. TSH normal. Chest xray does not show acute infectious process. I viewed image and agree with radiologist impression. UA shows signs of infection with large leukocytes, >50 WBC and many bacteria. Blood cultures collected. Patient started on 1L IVF. Primary oncologist Dr. Alen Blew agrees with plan for admission.   Consulted hospitalist to admit. Case discussed with Dr. Lonny Prude who accepts patient for admission. Unfortunately the infusion center pharmacy is closed therefore ceftriazxone was unable to be  started here in [facility].   Visit Diagnosis: 1. Sepsis, due to unspecified organism, unspecified whether acute organ dysfunction present ([place])   2. Acute cystitis without hematuria      No orders of the defined types were placed in this encounter.   All questions were answered. The patient knows to call the clinic with any problems, questions or concerns. No barriers to learning was detected.  I have spent a total of 35 minutes minutes of face-to-face and non-face-to-face time, preparing to see the patient, obtaining and/or reviewing separately obtained history, performing a medically appropriate examination, counseling and educating the patient, ordering tests,  documenting clinical information in the electronic health record, and care coordination.     Thank you for allowing me to participate in the care of this patient.    Barrie Folk, PA-C Department of Hematology/Oncology [place] [facility] Phone: [phone]   09/12/2021 5:46 PM

## 2021-09-12 NOTE — Progress Notes (Signed)
   09/12/21 2334  Assess: MEWS Score  Temp (!) 102.8 F (39.3 C)  BP (!) 147/82  Pulse Rate (!) 136  Resp (!) 32  SpO2 93 %  Assess: MEWS Score  MEWS Temp 2  MEWS Systolic 0  MEWS Pulse 3  MEWS RR 2  MEWS LOC 0  MEWS Score 7  MEWS Score Color Red  Assess: if the MEWS score is Yellow or Red  Were vital signs taken at a resting state? Yes  Focused Assessment Change from prior assessment (see assessment flowsheet)  Does the patient meet 2 or more of the SIRS criteria? Yes  Does the patient have a confirmed or suspected source of infection? Yes  Provider and Rapid Response Notified? Yes  MEWS guidelines implemented *See Row Information* Yes  Treat  MEWS Interventions Escalated (See documentation below)  Pain Score 0  Take Vital Signs  Increase Vital Sign Frequency  Red: Q 1hr X 4 then Q 4hr X 4, if remains red, continue Q 4hrs  Escalate  MEWS: Escalate Red: discuss with charge nurse/RN and provider, consider discussing with RRT  Notify: Charge Nurse/RN  Name of Charge Nurse/RN Notified Hortencia Conradi, RN  Date Charge Nurse/RN Notified 09/12/21  Time Charge Nurse/RN Notified 2340  Notify: Provider  Provider Name/Title Clarene Essex  Date Provider Notified 09/12/21  Time Provider Notified 2341  Notification Type Page  Notification Reason Other (Comment) (RED MEWS)  Provider response See new orders  Date of Provider Response 09/12/21  Time of Provider Response 2348  Notify: Rapid Response  Name of Rapid Response RN Notified Mandy, RN  Date Rapid Response Notified 09/12/21  Time Rapid Response Notified 2340  Document  Patient Outcome Other (Comment) (pending.)  Progress note created (see row info) Yes  Assess: SIRS CRITERIA  SIRS Temperature  1  SIRS Pulse 1  SIRS Respirations  1  SIRS WBC 0  SIRS Score Sum  3

## 2021-09-13 ENCOUNTER — Inpatient Hospital Stay (HOSPITAL_COMMUNITY): Payer: BC Managed Care – PPO

## 2021-09-13 ENCOUNTER — Encounter (HOSPITAL_COMMUNITY): Payer: Self-pay | Admitting: Internal Medicine

## 2021-09-13 DIAGNOSIS — A419 Sepsis, unspecified organism: Secondary | ICD-10-CM | POA: Diagnosis not present

## 2021-09-13 DIAGNOSIS — R6520 Severe sepsis without septic shock: Secondary | ICD-10-CM

## 2021-09-13 LAB — CBC
HCT: 29.9 % — ABNORMAL LOW (ref 39.0–52.0)
Hemoglobin: 9.9 g/dL — ABNORMAL LOW (ref 13.0–17.0)
MCH: 31.2 pg (ref 26.0–34.0)
MCHC: 33.1 g/dL (ref 30.0–36.0)
MCV: 94.3 fL (ref 80.0–100.0)
Platelets: 397 10*3/uL (ref 150–400)
RBC: 3.17 MIL/uL — ABNORMAL LOW (ref 4.22–5.81)
RDW: 20 % — ABNORMAL HIGH (ref 11.5–15.5)
WBC: 54.7 10*3/uL (ref 4.0–10.5)
nRBC: 0 % (ref 0.0–0.2)

## 2021-09-13 LAB — LACTIC ACID, PLASMA
Lactic Acid, Venous: 1.3 mmol/L (ref 0.5–1.9)
Lactic Acid, Venous: 0.8 mmol/L (ref 0.5–1.9)

## 2021-09-13 LAB — BASIC METABOLIC PANEL
Anion gap: 10 (ref 5–15)
BUN: 29 mg/dL — ABNORMAL HIGH (ref 6–20)
CO2: 20 mmol/L — ABNORMAL LOW (ref 22–32)
Calcium: 7.9 mg/dL — ABNORMAL LOW (ref 8.9–10.3)
Chloride: 102 mmol/L (ref 98–111)
Creatinine, Ser: 2.01 mg/dL — ABNORMAL HIGH (ref 0.61–1.24)
GFR, Estimated: 38 mL/min — ABNORMAL LOW (ref >60)
Glucose, Bld: 115 mg/dL — ABNORMAL HIGH (ref 70–99)
Potassium: 4.2 mmol/L (ref 3.5–5.1)
Sodium: 132 mmol/L — ABNORMAL LOW (ref 135–145)

## 2021-09-13 LAB — PROCALCITONIN: Procalcitonin: 42.71 ng/mL

## 2021-09-13 LAB — MRSA NEXT GEN BY PCR, NASAL: MRSA by PCR Next Gen: NOT DETECTED

## 2021-09-13 LAB — URINE CULTURE

## 2021-09-13 MED ORDER — SODIUM CHLORIDE 0.9 % IV SOLN
1.0000 g | Freq: Two times a day (BID) | INTRAVENOUS | Status: DC
  Administered 2021-09-13 – 2021-09-14 (×3): 1 g via INTRAVENOUS
  Filled 2021-09-13 (×4): qty 1

## 2021-09-13 MED ORDER — ACETAMINOPHEN 500 MG PO TABS
1000.0000 mg | ORAL_TABLET | Freq: Four times a day (QID) | ORAL | Status: DC | PRN
  Administered 2021-09-13 – 2021-09-16 (×10): 1000 mg via ORAL
  Filled 2021-09-13 (×11): qty 2

## 2021-09-13 MED ORDER — CHLORHEXIDINE GLUCONATE CLOTH 2 % EX PADS
6.0000 | MEDICATED_PAD | Freq: Every day | CUTANEOUS | Status: DC
  Administered 2021-09-13 – 2021-09-16 (×4): 6 via TOPICAL

## 2021-09-13 MED ORDER — CHLORHEXIDINE GLUCONATE CLOTH 2 % EX PADS
6.0000 | MEDICATED_PAD | Freq: Every day | CUTANEOUS | Status: DC
  Administered 2021-09-13: 6 via TOPICAL

## 2021-09-13 MED ORDER — LACTATED RINGERS IV SOLN: INTRAVENOUS | Status: DC

## 2021-09-13 MED ORDER — SODIUM CHLORIDE 0.9 % IV BOLUS
1000.0000 mL | Freq: Once | INTRAVENOUS | Status: AC
  Administered 2021-09-13: 1000 mL via INTRAVENOUS

## 2021-09-13 NOTE — Progress Notes (Signed)
PROGRESS NOTE    KEYSTON Gibson   NFA:[id number]  DOB: [birth year]  DOA: 09/12/2021 PCP: [name], DO   Brief Narrative:  Barry Gibson is a 60 y.o. male with medical history significant for stage IV bladder cancer s/p robotic assisted laparoscopic radical cystectomy and lymphadenectomy January 2022 with ileal conduit, hypertension, rheumatoid arthritis who presented to the cancer center for evaluation of fevers, nausea, vomiting.  The patient states that last week on around Monday he developed a low-grade fever fatigue and generalized body aches.  This resolved within 3 days and for the next 5 days he felt well.  Unfortunately this past Tuesday, he developed severe nausea and subsequently began to have vomiting.  He was having fevers chills and sweats as well.    Subjective: He is feeling feverish and having chills.    Assessment & Plan:   Principal Problem:   Severe sepsis with acute organ dysfunction in setting of bladder cancer and immune compromised state - secondary to UTI - lactic acid 1.3- improved to 0.8 - Procalcitonin 42.71 - he continues to have rigors and fevers of 102.7 - suspected UTI with bacteremia - Have changed Ceftriaxone to Ertapenem to cover for ESBL considering ongoing fevers - f/u cultures  Active Problems:   Bladder cancer ([place]) - receiving chemotherapy as outpt     AKI (acute kidney injury) with metabolic acidosis ([facility]) - secondary to sepsis - Cr rising cont IVF- given another 1 L of NS today- follow urine output closely - renal CT performed today- no signs of obstruction or abscess at this time   Time spent in minutes: 35 DVT prophylaxis: enoxaparin (LOVENOX) injection 40 mg Start: 09/12/21 2200  Code Status: Full code Family Communication:  Level of Care: Level of care: Med-Surg Disposition Plan:  Status is: Inpatient  Remains inpatient appropriate because: sepsis requiring IV antibiotics    Consultants:  none Procedures:   none Antimicrobials:  Anti-infectives (From admission, onward)    Start     Dose/Rate Route Frequency Ordered Stop   09/13/21 0900  meropenem (MERREM) 1 g in sodium chloride 0.9 % 100 mL IVPB        1 g 200 mL/hr over 30 Minutes Intravenous Every 12 hours 09/13/21 0833     09/12/21 2000  cefTRIAXone (ROCEPHIN) 2 g in sodium chloride 0.9 % 100 mL IVPB  Status:  Discontinued        2 g 200 mL/hr over 30 Minutes Intravenous Every 24 hours 09/12/21 1900 09/13/21 0816        Objective: Vitals:   09/13/21 0230 09/13/21 0351 09/13/21 0753 09/13/21 1100  BP: 116/82 108/78 123/73 114/74  Pulse: 100 92 (!) 139 (!) 114  Resp: (!) 24 (!) 22 16 20   Temp: 99.7 F (37.6 C) 99.2 F (37.3 C) (!) 101.1 F (38.4 C) 99.1 F (37.3 C)  TempSrc: Oral Oral Axillary Axillary  SpO2: 94% 95% 92% 94%  Weight:      Height:        Intake/Output Summary (Last 24 hours) at 09/13/2021 1401 Last data filed at 09/13/2021 1208 Gross per 24 hour  Intake 1736.39 ml  Output 1225 ml  Net 511.39 ml   Filed Weights   09/12/21 1830  Weight: 89.2 kg    Examination: General exam: Appears comfortable  HEENT: PERRLA, oral mucosa moist, no sclera icterus or thrush Respiratory system: Clear to auscultation. Respiratory effort normal. Cardiovascular system: S1 & S2 heard, RRR.   Gastrointestinal system: Abdomen  soft, non-tender, nondistended. Normal bowel sounds. GU: ileostomy present  Central nervous system: Alert and oriented. No focal neurological deficits. Extremities: No cyanosis, clubbing or edema Skin: No rashes or ulcers Psychiatry:  Mood & affect appropriate.     Data Reviewed: I have personally reviewed following labs and imaging studies  CBC: Recent Labs  Lab 09/07/21 1240 09/12/21 1254 09/13/21 0604  WBC 10.9* 60.4* 54.7*  NEUTROABS 6.2 54.9*  --   HGB 9.1* 10.3* 9.9*  HCT 28.2* 31.4* 29.9*  MCV 94.0 93.7 94.3  PLT 538* 471* 614   Basic Metabolic Panel: Recent Labs  Lab  09/07/21 1240 09/12/21 1254 09/13/21 0604  NA 138 136 132*  K 4.0 4.3 4.2  CL 109 104 102  CO2 22 19* 20*  GLUCOSE 133* 130* 115*  BUN 21* 19 29*  CREATININE 1.42* 1.81* 2.01*  CALCIUM 8.5* 9.1 7.9*   GFR: Estimated Creatinine Clearance: 42.7 mL/min (A) (by C-G formula based on SCr of 2.01 mg/dL (H)). Liver Function Tests: Recent Labs  Lab 09/07/21 1240 09/12/21 1254  AST 14* 13*  ALT 10 7  ALKPHOS 83 78  BILITOT 0.3 1.1  PROT 7.3 7.6  ALBUMIN 3.1* 3.0*   No results for input(s): LIPASE, AMYLASE in the last 168 hours. No results for input(s): AMMONIA in the last 168 hours. Coagulation Profile: No results for input(s): INR, PROTIME in the last 168 hours. Cardiac Enzymes: No results for input(s): CKTOTAL, CKMB, CKMBINDEX, TROPONINI in the last 168 hours. BNP (last 3 results) No results for input(s): PROBNP in the last 8760 hours. HbA1C: No results for input(s): HGBA1C in the last 72 hours. CBG: No results for input(s): GLUCAP in the last 168 hours. Lipid Profile: No results for input(s): CHOL, HDL, LDLCALC, TRIG, CHOLHDL, LDLDIRECT in the last 72 hours. Thyroid Function Tests: Recent Labs    09/12/21 1254  TSH 1.325   Anemia Panel: No results for input(s): VITAMINB12, FOLATE, FERRITIN, TIBC, IRON, RETICCTPCT in the last 72 hours. Urine analysis:    Component Value Date/Time   COLORURINE YELLOW 09/12/2021 1254   APPEARANCEUR CLOUDY (A) 09/12/2021 1254   APPEARANCEUR Hazy (A) 09/22/2020 1521   LABSPEC 1.016 09/12/2021 1254   PHURINE 6.0 09/12/2021 1254   GLUCOSEU NEGATIVE 09/12/2021 1254   HGBUR SMALL (A) 09/12/2021 1254   BILIRUBINUR NEGATIVE 09/12/2021 1254   BILIRUBINUR Negative 09/22/2020 1521   KETONESUR NEGATIVE 09/12/2021 1254   PROTEINUR 100 (A) 09/12/2021 1254   UROBILINOGEN 0.2 06/09/2020 1536   NITRITE NEGATIVE 09/12/2021 1254   LEUKOCYTESUR LARGE (A) 09/12/2021 1254   Sepsis Labs: @LABRCNTIP (procalcitonin:4,lacticidven:4) ) Recent Results  (from the past 240 hour(s))  Culture, Blood     Status: None (Preliminary result)   Collection Time: 09/12/21 12:54 PM   Specimen: BLOOD  Result Value Ref Range Status   Specimen Description BLOOD SITE NOT SPECIFIED  Final   Special Requests   Final    BOTTLES DRAWN AEROBIC AND ANAEROBIC Blood Culture adequate volume   Culture   Final    NO GROWTH < 24 HOURS Performed at [facility], [place] [address] [place], [place] [address]    Report Status PENDING  Incomplete  Culture, Blood     Status: None (Preliminary result)   Collection Time: 09/12/21 12:56 PM   Specimen: BLOOD  Result Value Ref Range Status   Specimen Description BLOOD LEFT ANTECUBITAL  Final   Special Requests   Final    BOTTLES DRAWN AEROBIC AND ANAEROBIC Blood Culture adequate  volume   Culture   Final    NO GROWTH < 24 HOURS Performed at [facility], [place] [address] [place], [place] [address]    Report Status PENDING  Incomplete  Resp Panel by RT-PCR (Flu A&B, Covid) Nasopharyngeal Swab     Status: None   Collection Time: 09/12/21  8:19 PM   Specimen: Nasopharyngeal Swab; Nasopharyngeal(NP) swabs in vial transport medium  Result Value Ref Range Status   SARS Coronavirus 2 by RT PCR NEGATIVE NEGATIVE Final    Comment: (NOTE) SARS-CoV-2 target nucleic acids are NOT DETECTED.  The SARS-CoV-2 RNA is generally detectable in upper respiratory specimens during the acute phase of infection. The lowest concentration of SARS-CoV-2 viral copies this assay can detect is 138 copies/mL. A negative result does not preclude SARS-Cov-2 infection and should not be used as the sole basis for treatment or other patient management decisions. A negative result may occur with  improper specimen collection/handling, submission of specimen other than nasopharyngeal swab, presence of viral mutation(s) within the areas targeted by this assay, and inadequate number of viral copies(<138 copies/mL). A negative result must be  combined with clinical observations, patient history, and epidemiological information. The expected result is Negative.  Fact Sheet for Patients:  EntrepreneurPulse.com.au  Fact Sheet for Healthcare Providers:  IncredibleEmployment.be  This test is no t yet approved or cleared by the [place] FDA and  has been authorized for detection and/or diagnosis of SARS-CoV-2 by FDA under an Emergency Use Authorization (EUA). This EUA will remain  in effect (meaning this test can be used) for the duration of the COVID-19 declaration under Section 564(b)(1) of the Act, 21 U.S.C.section 360bbb-3(b)(1), unless the authorization is terminated  or revoked sooner.       Influenza A by PCR NEGATIVE NEGATIVE Final   Influenza B by PCR NEGATIVE NEGATIVE Final    Comment: (NOTE) The Xpert Xpress SARS-CoV-2/FLU/RSV plus assay is intended as an aid in the diagnosis of influenza from Nasopharyngeal swab specimens and should not be used as a sole basis for treatment. Nasal washings and aspirates are unacceptable for Xpert Xpress SARS-CoV-2/FLU/RSV testing.  Fact Sheet for Patients: EntrepreneurPulse.com.au  Fact Sheet for Healthcare Providers: IncredibleEmployment.be  This test is not yet approved or cleared by the [place] FDA and has been authorized for detection and/or diagnosis of SARS-CoV-2 by FDA under an Emergency Use Authorization (EUA). This EUA will remain in effect (meaning this test can be used) for the duration of the COVID-19 declaration under Section 564(b)(1) of the Act, 21 U.S.C. section 360bbb-3(b)(1), unless the authorization is terminated or revoked.  Performed at [facility], [place] [address], [place], [place] [address]          Radiology Studies: DG Chest 2 View  Result Date: 09/12/2021 CLINICAL DATA:  Fever.  Cancer patient. EXAM: CHEST - 2 VIEW COMPARISON:  CT chest  dated August 31, 2021. FINDINGS: Unchanged right chest wall catheter. The heart size and mediastinal contours are within normal limits. Normal pulmonary vascularity. No focal consolidation, pleural effusion, or pneumothorax. No acute osseous abnormality. IMPRESSION: No active cardiopulmonary disease. Electronically Signed   By: Titus Dubin M.D.   On: 09/12/2021 14:05   CT RENAL STONE STUDY  Result Date: 09/13/2021 CLINICAL DATA:  Acute renal failure with rising creatinine EXAM: CT ABDOMEN AND PELVIS WITHOUT CONTRAST TECHNIQUE: Multidetector CT imaging of the abdomen and pelvis was performed following the standard protocol without IV contrast. COMPARISON:  Thirteen days ago FINDINGS:  Lower chest: Subpleural opacity with mild volume loss in the left lower lobe. Coronary atherosclerosis. Hepatobiliary: No focal liver abnormality.Calcified gallstone. No right upper quadrant inflammation Pancreas: Unremarkable. Spleen: Unremarkable. Adrenals/Urinary Tract: Negative adrenals. Bilateral perinephric stranding which is nonspecific, although more prominent on the left. Nearly 14 cm long left kidney, previously 11.5 cm. Bilateral heterogeneous density of the kidneys. No hyperdense or expanded renal vein. Cystectomy with conduit. No hydronephrosis. Stomach/Bowel: Distal colonic diverticulosis. No evidence of bowel obstruction or inflammation. Vascular/Lymphatic: No acute vascular abnormality. Atherosclerosis and abdominal aortic aneurysm measuring up to 3.4 cm. Recommend follow-up every 3 years. This recommendation follows ACR consensus guidelines: White Paper of the ACR Incidental Findings Committee II on Vascular Findings. J Am Coll Radiol 2013; 10:789-794. No mass or adenopathy. Reproductive:No pathologic findings. Other: No ascites or pneumoperitoneum. Small fatty midline hernia above the umbilicus. Musculoskeletal: No acute abnormalities. IMPRESSION: 1. Heterogeneous density of the kidneys with perinephric  stranding and increased renal size, suspect pyelonephritis. No hydronephrosis. 2. Left lower lobe opacity with volume loss suggesting atelectasis. 3. Cholelithiasis and colonic diverticulosis. Electronically Signed   By: Jorje Guild M.D.   On: 09/13/2021 10:28      Scheduled Meds:  Chlorhexidine Gluconate Cloth  6 each Topical Daily   enoxaparin (LOVENOX) injection  40 mg Subcutaneous Q24H   ibuprofen  200 mg Oral Once   pantoprazole  40 mg Oral Daily   pravastatin  40 mg Oral Daily   sertraline  50 mg Oral Daily   Continuous Infusions:  meropenem (MERREM) IV 1 g (09/13/21 0945)     LOS: 1 day      Debbe Odea, MD [facility] Pager: [email or web address] 09/13/2021, 2:01 PM

## 2021-09-13 NOTE — Progress Notes (Signed)
   09/13/21 0753 09/13/21 1735  Assess: MEWS Score  Temp (!) 101.1 F (38.4 C) (!) 102.7 F (39.3 C)  BP 123/73 (!) 157/90  Pulse Rate (!) 139 (!) 128  SpO2  --  91 %  O2 Device  --  Room Air  Assess: MEWS Score  MEWS Temp 1 2  MEWS Systolic 0 0  MEWS Pulse 3 2  MEWS RR 0 0  MEWS LOC 0 0  MEWS Score 4 4  MEWS Score Color Red Red  Assess: if the MEWS score is Yellow or Red  Were vital signs taken at a resting state?  --  Yes  Focused Assessment  --  Change from prior assessment (see assessment flowsheet)  Does the patient meet 2 or more of the SIRS criteria?  --  Yes  Does the patient have a confirmed or suspected source of infection?  --  Yes  Provider and Rapid Response Notified?  --  Yes  MEWS guidelines implemented *See Row Information*  --  Yes  Treat  MEWS Interventions  --  Escalated (See documentation below)  Pain Score  --  0  Take Vital Signs  Increase Vital Sign Frequency   --  Red: Q 1hr X 4 then Q 4hr X 4, if remains red, continue Q 4hrs  Escalate  MEWS: Escalate  --  Red: discuss with charge nurse/RN and provider, consider discussing with RRT  Notify: Charge Nurse/RN  Name of Charge Nurse/RN Notified  --  Maudie Mercury F  Date Charge Nurse/RN Notified  --  09/13/21  Time Charge Nurse/RN Notified  --  1740  Notify: Provider  Provider Name/Title  --  Wynelle Cleveland  Date Provider Notified  --  09/13/21  Time Provider Notified  --  1745  Notification Type  --  Page  Notification Reason  --  Change in status  Provider response  --  See new orders  Date of Provider Response  --  09/13/21  Time of Provider Response  --  1750  Notify: Rapid Response  Name of Rapid Response RN Notified  --  Neoma Laming RN  Date Rapid Response Notified  --  09/13/21  Time Rapid Response Notified  --  6767  Document  Patient Outcome  --  Other (Comment)  Progress note created (see row info)  --  Yes  Assess: SIRS CRITERIA  SIRS Temperature  1 1  SIRS Pulse 1 1  SIRS Respirations  0 0  SIRS WBC 0 0   SIRS Score Sum  2 2

## 2021-09-13 NOTE — Progress Notes (Signed)
Transferred into room 1237.

## 2021-09-13 NOTE — Progress Notes (Signed)
   09/13/21 0753  Assess: MEWS Score  Temp (!) 101.1 F (38.4 C)  BP 123/73  Pulse Rate (!) 139  Resp 16  SpO2 92 %  O2 Device Room Air  Assess: MEWS Score  MEWS Temp 1  MEWS Systolic 0  MEWS Pulse 3  MEWS RR 0  MEWS LOC 0  MEWS Score 4  MEWS Score Color Red  Assess: if the MEWS score is Yellow or Red  Were vital signs taken at a resting state? Yes  Focused Assessment Change from prior assessment (see assessment flowsheet)  Does the patient meet 2 or more of the SIRS criteria? Yes  Does the patient have a confirmed or suspected source of infection? Yes  Provider and Rapid Response Notified? Yes  MEWS guidelines implemented *See Row Information* Yes  Treat  MEWS Interventions Escalated (See documentation below)  Take Vital Signs  Increase Vital Sign Frequency  Red: Q 1hr X 4 then Q 4hr X 4, if remains red, continue Q 4hrs  Escalate  MEWS: Escalate Red: discuss with charge nurse/RN and provider, consider discussing with RRT  Notify: Charge Nurse/RN  Name of Charge Nurse/RN Notified Co Charge Maudie Mercury F  Date Charge Nurse/RN Notified 09/13/21  Time Charge Nurse/RN Notified 0755  Notify: Provider  Provider Name/Title Rizwan  Date Provider Notified 09/13/21  Time Provider Notified 0800  Notification Type Page  Notification Reason Change in status  Provider response See new orders  Date of Provider Response 09/13/21  Time of Provider Response 0810  Notify: Rapid Response  Name of Rapid Response RN Notified Deborah  Date Rapid Response Notified 09/13/21  Time Rapid Response Notified 0800  Document  Patient Outcome Other (Comment)  Progress note created (see row info) Yes  Assess: SIRS CRITERIA  SIRS Temperature  1  SIRS Pulse 1  SIRS Respirations  0  SIRS WBC 0  SIRS Score Sum  2

## 2021-09-13 NOTE — Progress Notes (Signed)
Critical WBC count called at 0625. WBC 54.7.  This is acually lower than previous results.  Consistent lab values, and MD already aware. Barry Gibson

## 2021-09-13 NOTE — Progress Notes (Signed)
PHARMACY NOTE -  Meropenem  Pharmacy has been assisting with dosing of meropenem for UTI. Dosage remains stable at 1g IV q12 hr and further renal adjustments per institutional Pharmacy antibiotic protocol  Pharmacy will sign off, following peripherally for culture results or dose adjustments. Please reconsult if a change in clinical status warrants re-evaluation of dosage.  Reuel Boom, PharmD, BCPS [phone] 09/13/2021, 8:34 AM

## 2021-09-13 NOTE — Progress Notes (Signed)
IP PROGRESS NOTE  Subjective:   Events overnight noted.  [name] feels better with occasional fevers and chills overnight.  He is able to rest for the most part without any issues.  He denies any shortness of breath or difficulty breathing.  He denies any nausea or vomiting.  Objective:  Vital signs in last 24 hours: Temp:  [97.2 F (36.2 C)-102.8 F (39.3 C)] 99.2 F (37.3 C) (10/27 0351) Pulse Rate:  [92-136] 92 (10/27 0351) Resp:  [16-32] 22 (10/27 0351) BP: (92-147)/(60-82) 108/78 (10/27 0351) SpO2:  [92 %-98 %] 95 % (10/27 0351) Weight:  [196 lb 9.6 oz (89.2 kg)-199 lb 8 oz (90.5 kg)] 196 lb 9.6 oz (89.2 kg) (10/26 1830) Weight change:  Last BM Date: 09/12/21  Intake/Output from previous day: 10/26 0701 - 10/27 0700 In: 1736.4 [P.O.:120; I.V.:1008.3; IV Piggyback:608.1] Out: 650 [Urine:650] General: Alert, awake without distress. Head: Normocephalic atraumatic. Mouth: mucous membranes moist, pharynx normal without lesions Eyes: No scleral icterus.  Pupils are equal and round reactive to light. Resp: clear to auscultation bilaterally without rhonchi or wheezes or dullness to percussion. Cardio: regular rate and rhythm, S1, S2 normal, no murmur, click, rub or gallop GI: soft, non-tender; bowel sounds normal; no masses,  no organomegaly Musculoskeletal: No joint deformity or effusion. Neurological: No motor, sensory deficits.  Intact deep tendon reflexes. Skin: No rashes or lesions.  Portacath without erythema  Lab Results: Recent Labs    09/12/21 1254 09/13/21 0604  WBC 60.4* 54.7*  HGB 10.3* 9.9*  HCT 31.4* 29.9*  PLT 471* 397    BMET Recent Labs    09/12/21 1254 09/13/21 0604  NA 136 132*  K 4.3 4.2  CL 104 102  CO2 19* 20*  GLUCOSE 130* 115*  BUN 19 29*  CREATININE 1.81* 2.01*  CALCIUM 9.1 7.9*    Studies/Results: DG Chest 2 View  Result Date: 09/12/2021 CLINICAL DATA:  Fever.  Cancer patient. EXAM: CHEST - 2 VIEW COMPARISON:  CT chest  dated August 31, 2021. FINDINGS: Unchanged right chest wall catheter. The heart size and mediastinal contours are within normal limits. Normal pulmonary vascularity. No focal consolidation, pleural effusion, or pneumothorax. No acute osseous abnormality. IMPRESSION: No active cardiopulmonary disease. Electronically Signed   By: Titus Dubin M.D.   On: 09/12/2021 14:05    Medications: I have reviewed the patient's current medications.  Assessment/Plan:  60 year old with:  1.  Sepsis with fever and hypotension: Etiology is unclear at this time with cultures currently pending.  His white cell count has dropped overnight currently at 54.7.  He is afebrile currently but did have a temperature of 102.8.  For the time being, I agree with the current management with broad-spectrum antibiotics and supportive management.  2.  Bladder cancer diagnosed in June 2022 with stage IV presentation and pelvic adenopathy.  He is currently receiving Pembrolizumab which should not be myelosuppressive at this time.  His next treatment is in the next 2 weeks or so.  3.  Acute renal failure: Likely related to his recent sepsis episode, dehydration possible acute kidney injury from platinum based therapy.  Function is overall stable.  4.  Follow-up in disposition: We will continue to follow his progress while he is hospitalized.  I have no objections to discharge once it was felt safe by the primary team.  25  minutes were dedicated to this visit.  50% of the time was face-to-face and the time was spent on reviewing laboratory data, imaging studies,discussing differential  diagnosis and answering questions regarding future plan.    LOS: 1 day   Zola Button 09/13/2021, 7:38 AM

## 2021-09-13 NOTE — Significant Event (Signed)
Rapid Response Event Note   Reason for Call :  Received call from nursing assistant on behalf of bedside RN requesting rapid response nurse urgently come to see patient.  Initial Focused Assessment:  Arrived to find patient alert, oriented, following commands, with fever of 102.8 F, tachycardic in 120's, stable sBP 120-140's, all of which triggered "RED MEWS" score. Staff placing ice packs on patient when RR RN arrived. Lung sounds diminished, but clear. No abnormal heart sounds. Patient reported mild headache.   Interventions:  Bedside RN provided and educated patient about use of incentive spirometry and ice packs to help decrease fever. She also administered anti-pyretics and antibiotics as prescribed.   Plan of Care:  Continue current plan of care with MEWS protocol for reassessing vitals. Provider to order morning labs.   Event Summary:   MD Notified: Clarene Essex, NP  Call Time: [place] Time: 2350 End Time: 0030  Selinda Michaels, RN

## 2021-09-14 ENCOUNTER — Inpatient Hospital Stay (HOSPITAL_COMMUNITY): Payer: BC Managed Care – PPO

## 2021-09-14 DIAGNOSIS — R6520 Severe sepsis without septic shock: Secondary | ICD-10-CM | POA: Diagnosis not present

## 2021-09-14 DIAGNOSIS — J180 Bronchopneumonia, unspecified organism: Secondary | ICD-10-CM | POA: Diagnosis not present

## 2021-09-14 DIAGNOSIS — A419 Sepsis, unspecified organism: Secondary | ICD-10-CM | POA: Diagnosis not present

## 2021-09-14 LAB — BASIC METABOLIC PANEL
Anion gap: 9 (ref 5–15)
BUN: 28 mg/dL — ABNORMAL HIGH (ref 6–20)
CO2: 20 mmol/L — ABNORMAL LOW (ref 22–32)
Calcium: 7.7 mg/dL — ABNORMAL LOW (ref 8.9–10.3)
Chloride: 103 mmol/L (ref 98–111)
Creatinine, Ser: 1.71 mg/dL — ABNORMAL HIGH (ref 0.61–1.24)
GFR, Estimated: 46 mL/min — ABNORMAL LOW (ref >60)
Glucose, Bld: 101 mg/dL — ABNORMAL HIGH (ref 70–99)
Potassium: 3.6 mmol/L (ref 3.5–5.1)
Sodium: 132 mmol/L — ABNORMAL LOW (ref 135–145)

## 2021-09-14 LAB — CBC
HCT: 24.9 % — ABNORMAL LOW (ref 39.0–52.0)
Hemoglobin: 8 g/dL — ABNORMAL LOW (ref 13.0–17.0)
MCH: 30.9 pg (ref 26.0–34.0)
MCHC: 32.1 g/dL (ref 30.0–36.0)
MCV: 96.1 fL (ref 80.0–100.0)
Platelets: 294 10*3/uL (ref 150–400)
RBC: 2.59 MIL/uL — ABNORMAL LOW (ref 4.22–5.81)
RDW: 19.8 % — ABNORMAL HIGH (ref 11.5–15.5)
WBC: 38.4 10*3/uL — ABNORMAL HIGH (ref 4.0–10.5)
nRBC: 0 % (ref 0.0–0.2)

## 2021-09-14 MED ORDER — ADULT MULTIVITAMIN W/MINERALS CH
1.0000 | ORAL_TABLET | Freq: Every day | ORAL | Status: DC
  Administered 2021-09-14 – 2021-09-17 (×4): 1 via ORAL
  Filled 2021-09-14 (×3): qty 1

## 2021-09-14 MED ORDER — SODIUM CHLORIDE 0.9 % IV SOLN
1.0000 g | Freq: Three times a day (TID) | INTRAVENOUS | Status: DC
  Administered 2021-09-14: 1 g via INTRAVENOUS
  Filled 2021-09-14 (×2): qty 1

## 2021-09-14 MED ORDER — SODIUM CHLORIDE 0.9 % IV SOLN
1.0000 g | Freq: Three times a day (TID) | INTRAVENOUS | Status: DC
  Administered 2021-09-15: 1 g via INTRAVENOUS
  Filled 2021-09-14 (×2): qty 1

## 2021-09-14 MED ORDER — SODIUM CHLORIDE 0.9 % IV SOLN
500.0000 mg | INTRAVENOUS | Status: DC
  Administered 2021-09-14 – 2021-09-16 (×3): 500 mg via INTRAVENOUS
  Filled 2021-09-14 (×4): qty 500

## 2021-09-14 MED ORDER — ENSURE ENLIVE PO LIQD
237.0000 mL | Freq: Two times a day (BID) | ORAL | Status: DC
  Administered 2021-09-14 – 2021-09-17 (×3): 237 mL via ORAL

## 2021-09-14 MED ORDER — DIPHENOXYLATE-ATROPINE 2.5-0.025 MG PO TABS
1.0000 | ORAL_TABLET | Freq: Four times a day (QID) | ORAL | Status: DC | PRN
  Administered 2021-09-14 – 2021-09-15 (×3): 1 via ORAL
  Filled 2021-09-14 (×3): qty 1

## 2021-09-14 NOTE — Progress Notes (Signed)
PHARMACY NOTE:  ANTIMICROBIAL RENAL DOSAGE ADJUSTMENT  Current antimicrobial regimen includes a mismatch between antimicrobial dosage and estimated renal function.  As per policy approved by the Pharmacy & Therapeutics and Medical Executive Committees, the antimicrobial dosage will be adjusted accordingly.  Current antimicrobial dosage:  Meropenem 1g IV q12h  Indication: UTI  Renal Function:  Estimated Creatinine Clearance: 50.2 mL/min (A) (by C-G formula based on SCr of 1.71 mg/dL (H)). []      On intermittent HD, scheduled: []      On CRRT    Antimicrobial dosage has been changed to:  1g IV q8h  Additional comments:   Thank you for allowing pharmacy to be a part of this patient's care.  Peggyann Juba, PharmD, BCPS Pharmacy: [phone] 09/14/2021 11:59 AM

## 2021-09-14 NOTE — Progress Notes (Addendum)
PROGRESS NOTE    Barry Gibson   OMV:[id number]  DOB: 25-Aug-1961  DOA: 09/12/2021 PCP: [name], DO   Brief Narrative:  Barry Gibson is a 60 y.o. male with medical history significant for stage IV bladder cancer s/p robotic assisted laparoscopic radical cystectomy and lymphadenectomy January 2022 with ileal conduit, hypertension, rheumatoid arthritis who presented to the cancer center for evaluation of fevers, nausea, vomiting.  The patient states that last week on around Monday he developed a low-grade fever fatigue and generalized body aches.  This resolved within 3 days and for the next 5 days he felt well.  Unfortunately this past Tuesday, he developed severe nausea and subsequently began to have vomiting.  He was having fevers chills and sweats as well.    Subjective: He feels weak and has no appetite.   Assessment & Plan:   Principal Problem:   Severe sepsis with acute organ dysfunction in setting of bladder cancer and immune compromised state - secondary to UTI - lactic acid 1.3- improved to 0.8 - Procalcitonin 42.71 - he continues to have rigors and fevers of 102.7 - suspected UTI  - unfortunately urine culture is showing multiple flora and this is likey because he has a urostomy - Du to ongoing fevers of 102, I changed Ceftriaxone to Ertapenem to cover for ESBL considering he is immune compromised and susceptible to resistant organisms - he is beginning to feel better- WBC improved from 60 to 38.4 - blood cultures remain negative  Active Problems: Bronchopneumonia- CAP - LLL infiltrate noted on CXR today which was not previously present no a 2 view CXR on 10/26 - start Azithromycin - cont Imipenem    Bladder cancer ([facility]) - receiving chemotherapy as outpt     AKI (acute kidney injury) with metabolic acidosis ([facility]) - secondary to sepsis - Cr was rising cont IVF- given a 1 L of NS on 10/27 and then continuous IVF- Cr improving now - cont NS at 100  cc/hr - renal CT performed to 10/27-  no signs of obstruction or abscess     Time spent in minutes: 35 DVT prophylaxis: enoxaparin (LOVENOX) injection 40 mg Start: 09/12/21 2200  Code Status: Full code Family Communication:  Level of Care: Level of care: Stepdown Disposition Plan:  Status is: Inpatient  Remains inpatient appropriate because: sepsis requiring IV antibiotics    Consultants:  none Procedures:  none Antimicrobials:  Anti-infectives (From admission, onward)    Start     Dose/Rate Route Frequency Ordered Stop   09/13/21 0900  meropenem (MERREM) 1 g in sodium chloride 0.9 % 100 mL IVPB        1 g 200 mL/hr over 30 Minutes Intravenous Every 12 hours 09/13/21 0833     09/12/21 2000  cefTRIAXone (ROCEPHIN) 2 g in sodium chloride 0.9 % 100 mL IVPB  Status:  Discontinued        2 g 200 mL/hr over 30 Minutes Intravenous Every 24 hours 09/12/21 1900 09/13/21 0816        Objective: Vitals:   09/14/21 0000 09/14/21 0010 09/14/21 0400 09/14/21 0839  BP:  (!) 149/77 121/68   Pulse:  (!) 107 87   Resp:  (!) 25 20   Temp: 97.9 F (36.6 C)  98.5 F (36.9 C) (!) 102.1 F (38.9 C)  TempSrc: Oral  Oral Axillary  SpO2:  93% 96%   Weight:      Height:        Intake/Output Summary (  Last 24 hours) at 09/14/2021 1102 Last data filed at 09/14/2021 1052 Gross per 24 hour  Intake 3289.26 ml  Output 1450 ml  Net 1839.26 ml    Filed Weights   09/12/21 1830  Weight: 89.2 kg    Examination: General exam: Appears comfortable  HEENT: PERRLA, oral mucosa moist, no sclera icterus or thrush Respiratory system: Clear to auscultation. Respiratory effort normal. Cardiovascular system: S1 & S2 heard, regular rate and rhythm Gastrointestinal system: Abdomen soft, non-tender, nondistended. Normal bowel sounds  - urosotomy present Central nervous system: Alert and oriented. No focal neurological deficits. Extremities: No cyanosis, clubbing or edema Skin: No rashes or  ulcers Psychiatry:  Mood & affect appropriate.      Data Reviewed: I have personally reviewed following labs and imaging studies  CBC: Recent Labs  Lab 09/07/21 1240 09/12/21 1254 09/13/21 0604 09/14/21 0245  WBC 10.9* 60.4* 54.7* 38.4*  NEUTROABS 6.2 54.9*  --   --   HGB 9.1* 10.3* 9.9* 8.0*  HCT 28.2* 31.4* 29.9* 24.9*  MCV 94.0 93.7 94.3 96.1  PLT 538* 471* 397 678    Basic Metabolic Panel: Recent Labs  Lab 09/07/21 1240 09/12/21 1254 09/13/21 0604 09/14/21 0245  NA 138 136 132* 132*  K 4.0 4.3 4.2 3.6  CL 109 104 102 103  CO2 22 19* 20* 20*  GLUCOSE 133* 130* 115* 101*  BUN 21* 19 29* 28*  CREATININE 1.42* 1.81* 2.01* 1.71*  CALCIUM 8.5* 9.1 7.9* 7.7*    GFR: Estimated Creatinine Clearance: 50.2 mL/min (A) (by C-G formula based on SCr of 1.71 mg/dL (H)). Liver Function Tests: Recent Labs  Lab 09/07/21 1240 09/12/21 1254  AST 14* 13*  ALT 10 7  ALKPHOS 83 78  BILITOT 0.3 1.1  PROT 7.3 7.6  ALBUMIN 3.1* 3.0*    No results for input(s): LIPASE, AMYLASE in the last 168 hours. No results for input(s): AMMONIA in the last 168 hours. Coagulation Profile: No results for input(s): INR, PROTIME in the last 168 hours. Cardiac Enzymes: No results for input(s): CKTOTAL, CKMB, CKMBINDEX, TROPONINI in the last 168 hours. BNP (last 3 results) No results for input(s): PROBNP in the last 8760 hours. HbA1C: No results for input(s): HGBA1C in the last 72 hours. CBG: No results for input(s): GLUCAP in the last 168 hours. Lipid Profile: No results for input(s): CHOL, HDL, LDLCALC, TRIG, CHOLHDL, LDLDIRECT in the last 72 hours. Thyroid Function Tests: Recent Labs    09/12/21 1254  TSH 1.325    Anemia Panel: No results for input(s): VITAMINB12, FOLATE, FERRITIN, TIBC, IRON, RETICCTPCT in the last 72 hours. Urine analysis:    Component Value Date/Time   COLORURINE YELLOW 09/12/2021 1254   APPEARANCEUR CLOUDY (A) 09/12/2021 1254   APPEARANCEUR Hazy (A)  09/22/2020 1521   LABSPEC 1.016 09/12/2021 1254   PHURINE 6.0 09/12/2021 1254   GLUCOSEU NEGATIVE 09/12/2021 1254   HGBUR SMALL (A) 09/12/2021 1254   BILIRUBINUR NEGATIVE 09/12/2021 1254   BILIRUBINUR Negative 09/22/2020 1521   KETONESUR NEGATIVE 09/12/2021 1254   PROTEINUR 100 (A) 09/12/2021 1254   UROBILINOGEN 0.2 06/09/2020 1536   NITRITE NEGATIVE 09/12/2021 1254   LEUKOCYTESUR LARGE (A) 09/12/2021 1254   Sepsis Labs: @LABRCNTIP (procalcitonin:4,lacticidven:4) ) Recent Results (from the past 240 hour(s))  Culture, Blood     Status: None (Preliminary result)   Collection Time: 09/12/21 12:54 PM   Specimen: BLOOD  Result Value Ref Range Status   Specimen Description BLOOD SITE NOT SPECIFIED  Final   Special  Requests   Final    BOTTLES DRAWN AEROBIC AND ANAEROBIC Blood Culture adequate volume   Culture   Final    NO GROWTH < 24 HOURS Performed at [facility], [place] [address], [place], [place] [address]    Report Status PENDING  Incomplete  Urine Culture     Status: Abnormal   Collection Time: 09/12/21 12:54 PM   Specimen: Urine, Clean Catch  Result Value Ref Range Status   Specimen Description   Final    URINE, CLEAN CATCH Performed at [facility], [place] [address], [place], [place] [address]    Special Requests   Final    Immunocompromised Performed at [facility], [place] [address], [place], [place] [address]    Culture MULTIPLE SPECIES PRESENT, SUGGEST RECOLLECTION (A)  Final   Report Status 09/13/2021 FINAL  Final  Culture, Blood     Status: None (Preliminary result)   Collection Time: 09/12/21 12:56 PM   Specimen: BLOOD  Result Value Ref Range Status   Specimen Description BLOOD LEFT ANTECUBITAL  Final   Special Requests   Final    BOTTLES DRAWN AEROBIC AND ANAEROBIC Blood Culture adequate volume   Culture   Final    NO GROWTH < 24 HOURS Performed at [facility], [place] [address] [place], [place]  [address]    Report Status PENDING  Incomplete  Resp Panel by RT-PCR (Flu A&B, Covid) Nasopharyngeal Swab     Status: None   Collection Time: 09/12/21  8:19 PM   Specimen: Nasopharyngeal Swab; Nasopharyngeal(NP) swabs in vial transport medium  Result Value Ref Range Status   SARS Coronavirus 2 by RT PCR NEGATIVE NEGATIVE Final    Comment: (NOTE) SARS-CoV-2 target nucleic acids are NOT DETECTED.  The SARS-CoV-2 RNA is generally detectable in upper respiratory specimens during the acute phase of infection. The lowest concentration of SARS-CoV-2 viral copies this assay can detect is 138 copies/mL. A negative result does not preclude SARS-Cov-2 infection and should not be used as the sole basis for treatment or other patient management decisions. A negative result may occur with  improper specimen collection/handling, submission of specimen other than nasopharyngeal swab, presence of viral mutation(s) within the areas targeted by this assay, and inadequate number of viral copies(<138 copies/mL). A negative result must be combined with clinical observations, patient history, and epidemiological information. The expected result is Negative.  Fact Sheet for Patients:  EntrepreneurPulse.com.au  Fact Sheet for Healthcare Providers:  IncredibleEmployment.be  This test is no t yet approved or cleared by the [place] FDA and  has been authorized for detection and/or diagnosis of SARS-CoV-2 by FDA under an Emergency Use Authorization (EUA). This EUA will remain  in effect (meaning this test can be used) for the duration of the COVID-19 declaration under Section 564(b)(1) of the Act, 21 U.S.C.section 360bbb-3(b)(1), unless the authorization is terminated  or revoked sooner.       Influenza A by PCR NEGATIVE NEGATIVE Final   Influenza B by PCR NEGATIVE NEGATIVE Final    Comment: (NOTE) The Xpert Xpress SARS-CoV-2/FLU/RSV plus assay is intended as an  aid in the diagnosis of influenza from Nasopharyngeal swab specimens and should not be used as a sole basis for treatment. Nasal washings and aspirates are unacceptable for Xpert Xpress SARS-CoV-2/FLU/RSV testing.  Fact Sheet for Patients: EntrepreneurPulse.com.au  Fact Sheet for Healthcare Providers: IncredibleEmployment.be  This test is not yet approved or cleared by the [place]  FDA and has been authorized for detection and/or diagnosis of SARS-CoV-2 by FDA under an Emergency Use Authorization (EUA). This EUA will remain in effect (meaning this test can be used) for the duration of the COVID-19 declaration under Section 564(b)(1) of the Act, 21 U.S.C. section 360bbb-3(b)(1), unless the authorization is terminated or revoked.  Performed at [facility], [place] [address] [place], [place] [address]   MRSA Next Gen by PCR, Nasal     Status: None   Collection Time: 09/13/21  8:00 PM   Specimen: Nasal Mucosa; Nasal Swab  Result Value Ref Range Status   MRSA by PCR Next Gen NOT DETECTED NOT DETECTED Final    Comment: (NOTE) The GeneXpert MRSA Assay (FDA approved for NASAL specimens only), is one component of a comprehensive MRSA colonization surveillance program. It is not intended to diagnose MRSA infection nor to guide or monitor treatment for MRSA infections. Test performance is not FDA approved in patients less than 70 years old. Performed at [facility], [place] [address] [place], [place] [address]          Radiology Studies: DG Chest 2 View  Result Date: 09/12/2021 CLINICAL DATA:  Fever.  Cancer patient. EXAM: CHEST - 2 VIEW COMPARISON:  CT chest dated August 31, 2021. FINDINGS: Unchanged right chest wall catheter. The heart size and mediastinal contours are within normal limits. Normal pulmonary vascularity. No focal consolidation, pleural effusion, or pneumothorax. No acute osseous  abnormality. IMPRESSION: No active cardiopulmonary disease. Electronically Signed   By: Titus Dubin M.D.   On: 09/12/2021 14:05   CT RENAL STONE STUDY  Result Date: 09/13/2021 CLINICAL DATA:  Acute renal failure with rising creatinine EXAM: CT ABDOMEN AND PELVIS WITHOUT CONTRAST TECHNIQUE: Multidetector CT imaging of the abdomen and pelvis was performed following the standard protocol without IV contrast. COMPARISON:  Thirteen days ago FINDINGS: Lower chest: Subpleural opacity with mild volume loss in the left lower lobe. Coronary atherosclerosis. Hepatobiliary: No focal liver abnormality.Calcified gallstone. No right upper quadrant inflammation Pancreas: Unremarkable. Spleen: Unremarkable. Adrenals/Urinary Tract: Negative adrenals. Bilateral perinephric stranding which is nonspecific, although more prominent on the left. Nearly 14 cm long left kidney, previously 11.5 cm. Bilateral heterogeneous density of the kidneys. No hyperdense or expanded renal vein. Cystectomy with conduit. No hydronephrosis. Stomach/Bowel: Distal colonic diverticulosis. No evidence of bowel obstruction or inflammation. Vascular/Lymphatic: No acute vascular abnormality. Atherosclerosis and abdominal aortic aneurysm measuring up to 3.4 cm. Recommend follow-up every 3 years. This recommendation follows ACR consensus guidelines: White Paper of the ACR Incidental Findings Committee II on Vascular Findings. J Am Coll Radiol 2013; 10:789-794. No mass or adenopathy. Reproductive:No pathologic findings. Other: No ascites or pneumoperitoneum. Small fatty midline hernia above the umbilicus. Musculoskeletal: No acute abnormalities. IMPRESSION: 1. Heterogeneous density of the kidneys with perinephric stranding and increased renal size, suspect pyelonephritis. No hydronephrosis. 2. Left lower lobe opacity with volume loss suggesting atelectasis. 3. Cholelithiasis and colonic diverticulosis. Electronically Signed   By: Jorje Guild M.D.   On:  09/13/2021 10:28      Scheduled Meds:  Chlorhexidine Gluconate Cloth  6 each Topical Q2000   enoxaparin (LOVENOX) injection  40 mg Subcutaneous Q24H   feeding supplement  237 mL Oral BID BM   ibuprofen  200 mg Oral Once   pantoprazole  40 mg Oral Daily   pravastatin  40 mg Oral Daily   sertraline  50 mg Oral Daily   Continuous Infusions:  lactated ringers 125 mL/hr at 09/14/21  1052   meropenem (MERREM) IV Stopped (09/14/21 8003)     LOS: 2 days      Debbe Odea, MD [facility] Pager: [email or web address] 09/14/2021, 11:02 AM

## 2021-09-14 NOTE — Progress Notes (Signed)
Initial Nutrition Assessment  INTERVENTION:   -Ensure Enlive po BID, each supplement provides 350 kcal and 20 grams of protein   -Multivitamin with minerals daily  NUTRITION DIAGNOSIS:   Increased nutrient needs related to cancer and cancer related treatments as evidenced by estimated needs.  GOAL:   Patient will meet greater than or equal to 90% of their needs  MONITOR:   PO intake, Supplement acceptance, Labs, Weight trends, I & O's  REASON FOR ASSESSMENT:   Malnutrition Screening Tool    ASSESSMENT:   60 y.o. male with medical history significant for stage IV bladder cancer s/p robotic assisted laparoscopic radical cystectomy and lymphadenectomy January 2022 with ileal conduit, hypertension, rheumatoid arthritis who presented to the cancer center for evaluation of fevers, nausea, vomiting.  Patient in room, no family at bedside, waiting on assistance to the bathroom. Pt states he was eating well with good appetite prior to 10/26. Completed chemotherapy for bladder cancer on 9/30. States his main complaint has been some taste changes which worsening over the past few days where he couldn't drink soda or tea.  Pt started immunotherapy on 10/21 and initially was doing well. Pt has not eaten anything but a piece of sausage and an Ensure this morning. Denies issues with chewing or swallowing.   Pt reports weight loss of ~10 lbs. Per weight records, pt has lost 22 lbs since 8/12 (10% wt loss x 2.5 months, significant for time frame). Pt at risk of malnutrition if PO doesn't improve.  Medications: Lactated ringers  Labs reviewed:  Low Na  NUTRITION - FOCUSED PHYSICAL EXAM:  Flowsheet Row Most Recent Value  Orbital Region No depletion  Upper Arm Region No depletion  Thoracic and Lumbar Region No depletion  Buccal Region No depletion  [place] Region Mild depletion  Clavicle Bone Region No depletion  Clavicle and Acromion Bone Region No depletion  Scapular Bone Region No  depletion  Dorsal Hand No depletion  Patellar Region No depletion  Anterior Thigh Region No depletion  Posterior Calf Region No depletion  Edema (RD Assessment) None  Hair Reviewed  Eyes Reviewed  Mouth Reviewed  Skin Reviewed  Nails Reviewed       Diet Order:   Diet Order             Diet regular Room service appropriate? Yes; Fluid consistency: Thin  Diet effective now                   EDUCATION NEEDS:   No education needs have been identified at this time  Skin:  Skin Assessment: Reviewed RN Assessment  Last BM:  10/27  Height:   Ht Readings from Last 1 Encounters:  09/12/21 5\' 11"  (1.803 m)    Weight:   Wt Readings from Last 1 Encounters:  09/12/21 89.2 kg    BMI:  Body mass index is 27.42 kg/m.  Estimated Nutritional Needs:   Kcal:  2500-2700  Protein:  120-130g  Fluid:  2.5L/day  Clayton Bibles, MS, RD, LDN Inpatient Clinical Dietitian Contact information available via Amion

## 2021-09-14 NOTE — Progress Notes (Signed)
Dr. Wynelle Cleveland notified of temp 103.1 with rigors, increased HR 130s, decreased spo2 84% on room air, and increasing BP 45 min after tylenol administered. Pt placed on 6L [place] - spo2 91%. Ice packs placed in armpits and groin.

## 2021-09-14 NOTE — Plan of Care (Signed)
  Problem: Education: Goal: Knowledge of General Education information will improve Description: Including pain rating scale, medication(s)/side effects and non-pharmacologic comfort measures Outcome: Progressing   Problem: Clinical Measurements: Goal: Ability to maintain clinical measurements within normal limits will improve Outcome: Progressing Goal: Will remain free from infection Outcome: Progressing Goal: Diagnostic test results will improve Outcome: Progressing   Problem: Activity: Goal: Risk for activity intolerance will decrease Outcome: Progressing   Problem: Nutrition: Goal: Adequate nutrition will be maintained Outcome: Progressing   Problem: Elimination: Goal: Will not experience complications related to bowel motility Outcome: Progressing   Problem: Pain Managment: Goal: General experience of comfort will improve Outcome: Progressing   Problem: Safety: Goal: Ability to remain free from injury will improve Outcome: Progressing   Problem: Skin Integrity: Goal: Risk for impaired skin integrity will decrease Outcome: Progressing

## 2021-09-15 DIAGNOSIS — R6520 Severe sepsis without septic shock: Secondary | ICD-10-CM | POA: Diagnosis not present

## 2021-09-15 DIAGNOSIS — A419 Sepsis, unspecified organism: Secondary | ICD-10-CM | POA: Diagnosis not present

## 2021-09-15 LAB — CBC
HCT: 24.8 % — ABNORMAL LOW (ref 39.0–52.0)
Hemoglobin: 7.9 g/dL — ABNORMAL LOW (ref 13.0–17.0)
MCH: 31.1 pg (ref 26.0–34.0)
MCHC: 31.9 g/dL (ref 30.0–36.0)
MCV: 97.6 fL (ref 80.0–100.0)
Platelets: 258 10*3/uL (ref 150–400)
RBC: 2.54 MIL/uL — ABNORMAL LOW (ref 4.22–5.81)
RDW: 19.8 % — ABNORMAL HIGH (ref 11.5–15.5)
WBC: 21.7 10*3/uL — ABNORMAL HIGH (ref 4.0–10.5)
nRBC: 0 % (ref 0.0–0.2)

## 2021-09-15 LAB — BASIC METABOLIC PANEL
Anion gap: 7 (ref 5–15)
BUN: 28 mg/dL — ABNORMAL HIGH (ref 6–20)
CO2: 21 mmol/L — ABNORMAL LOW (ref 22–32)
Calcium: 7.6 mg/dL — ABNORMAL LOW (ref 8.9–10.3)
Chloride: 102 mmol/L (ref 98–111)
Creatinine, Ser: 1.47 mg/dL — ABNORMAL HIGH (ref 0.61–1.24)
GFR, Estimated: 55 mL/min — ABNORMAL LOW (ref >60)
Glucose, Bld: 102 mg/dL — ABNORMAL HIGH (ref 70–99)
Potassium: 4 mmol/L (ref 3.5–5.1)
Sodium: 130 mmol/L — ABNORMAL LOW (ref 135–145)

## 2021-09-15 LAB — STREP PNEUMONIAE URINARY ANTIGEN: Strep Pneumo Urinary Antigen: NEGATIVE

## 2021-09-15 MED ORDER — SODIUM CHLORIDE 0.9 % IV SOLN
1.0000 g | INTRAVENOUS | Status: DC
  Administered 2021-09-15 – 2021-09-16 (×2): 1 g via INTRAVENOUS
  Filled 2021-09-15 (×3): qty 10

## 2021-09-15 NOTE — Progress Notes (Addendum)
PROGRESS NOTE    EMIR NACK   STM:[id number]  DOB: 1960/11/20  DOA: 09/12/2021 PCP: [name], DO   Brief Narrative:  Barry Gibson is a 60 y.o. male with medical history significant for stage IV bladder cancer s/p robotic assisted laparoscopic radical cystectomy and lymphadenectomy January 2022 with ileal conduit, hypertension, rheumatoid arthritis who presented to the cancer center for evaluation of fevers, nausea, vomiting.  The patient states that last week on around Monday he developed a low-grade fever fatigue and generalized body aches.  This resolved within 3 days and for the next 5 days he felt well.  Unfortunately this past Tuesday, he developed severe nausea and subsequently began to have vomiting.  He was having fevers chills and sweats as well.    Subjective: He states he is feeling slightly better today. He is unable to specify how he feels better.   Assessment & Plan:   Principal Problem:   Severe sepsis with acute organ dysfunction in setting of bladder cancer and immune compromised state - secondary to UTI - lactic acid 1.3- improved to 0.8 - Procalcitonin 42.71 - he continues to have rigors and fevers of 102.7 - suspected UTI  - unfortunately urine culture is showing multiple flora and this is likey because he has a urostomy - Du to ongoing fevers of 102, I changed Ceftriaxone to Ertapenem to cover for ESBL considering he is immune compromised and susceptible to resistant organisms - he is beginning to feel better- WBC improved from 60 to 38.4 - as urine culture is not consistent with a UTI, will change Ertapenem back to Ceftriaxone to cover for CAP - blood cultures remain negative  Active Problems: Bronchopneumonia- CAP - LLL infiltrate noted on CXR on 10/28 which was not previously present on a 2 view CXR on 10/26 - started Azithromycin - fever curve and WBC counts improving - cont Imipenem    Bladder cancer ([place]) - receiving chemotherapy as  outpt     AKI (acute kidney injury) with metabolic acidosis ([place]) - secondary to sepsis - Cr was rising cont IVF- given a 1 L of NS on 10/27 and then continuous IVF- Cr improving now and IVF discontinued - renal CT performed to 10/27-  no signs of obstruction or abscess     Time spent in minutes: 35 DVT prophylaxis: enoxaparin (LOVENOX) injection 40 mg Start: 09/12/21 2200  Code Status: Full code Family Communication:  Level of Care: Level of care: Stepdown Disposition Plan:  Status is: Inpatient  Remains inpatient appropriate because: sepsis requiring IV antibiotics    Consultants:  none Procedures:  none Antimicrobials:  Anti-infectives (From admission, onward)    Start     Dose/Rate Route Frequency Ordered Stop   09/15/21 0400  meropenem (MERREM) 1 g in sodium chloride 0.9 % 100 mL IVPB        1 g 200 mL/hr over 30 Minutes Intravenous Every 8 hours 09/14/21 2111     09/14/21 2000  azithromycin (ZITHROMAX) 500 mg in sodium chloride 0.9 % 250 mL IVPB        500 mg 250 mL/hr over 60 Minutes Intravenous Every 24 hours 09/14/21 1813     09/14/21 1800  meropenem (MERREM) 1 g in sodium chloride 0.9 % 100 mL IVPB  Status:  Discontinued        1 g 200 mL/hr over 30 Minutes Intravenous Every 8 hours 09/14/21 1200 09/14/21 2111   09/13/21 0900  meropenem (MERREM) 1 g in sodium  chloride 0.9 % 100 mL IVPB  Status:  Discontinued        1 g 200 mL/hr over 30 Minutes Intravenous Every 12 hours 09/13/21 0833 09/14/21 1200   09/12/21 2000  cefTRIAXone (ROCEPHIN) 2 g in sodium chloride 0.9 % 100 mL IVPB  Status:  Discontinued        2 g 200 mL/hr over 30 Minutes Intravenous Every 24 hours 09/12/21 1900 09/13/21 0816        Objective: Vitals:   09/15/21 0400 09/15/21 0600 09/15/21 0759 09/15/21 0800  BP: (!) 122/59 129/78  135/70  Pulse: 79 84  92  Resp: 19 19  15   Temp:   98.3 F (36.8 C)   TempSrc:   Oral   SpO2: 99% 98%  93%  Weight:      Height:        Intake/Output  Summary (Last 24 hours) at 09/15/2021 1138 Last data filed at 09/15/2021 5784 Gross per 24 hour  Intake 1681.29 ml  Output 1480 ml  Net 201.29 ml    Filed Weights   09/12/21 1830  Weight: 89.2 kg    Examination: General exam: Appears comfortable  HEENT: PERRLA, oral mucosa moist, no sclera icterus or thrush Respiratory system: b/l rhonchi- Respiratory effort normal. Cardiovascular system: S1 & S2 heard, regular rate and rhythm Gastrointestinal system: Abdomen soft, non-tender, nondistended. Normal bowel sounds  - urostomy present Central nervous system: Alert and oriented. No focal neurological deficits. Extremities: No cyanosis, clubbing or edema Skin: No rashes or ulcers Psychiatry:  Mood & affect appropriate.        Data Reviewed: I have personally reviewed following labs and imaging studies  CBC: Recent Labs  Lab 09/12/21 1254 09/13/21 0604 09/14/21 0245 09/15/21 0353  WBC 60.4* 54.7* 38.4* 21.7*  NEUTROABS 54.9*  --   --   --   HGB 10.3* 9.9* 8.0* 7.9*  HCT 31.4* 29.9* 24.9* 24.8*  MCV 93.7 94.3 96.1 97.6  PLT 471* 397 294 696    Basic Metabolic Panel: Recent Labs  Lab 09/12/21 1254 09/13/21 0604 09/14/21 0245 09/15/21 0353  NA 136 132* 132* 130*  K 4.3 4.2 3.6 4.0  CL 104 102 103 102  CO2 19* 20* 20* 21*  GLUCOSE 130* 115* 101* 102*  BUN 19 29* 28* 28*  CREATININE 1.81* 2.01* 1.71* 1.47*  CALCIUM 9.1 7.9* 7.7* 7.6*    GFR: Estimated Creatinine Clearance: 58.3 mL/min (A) (by C-G formula based on SCr of 1.47 mg/dL (H)). Liver Function Tests: Recent Labs  Lab 09/12/21 1254  AST 13*  ALT 7  ALKPHOS 78  BILITOT 1.1  PROT 7.6  ALBUMIN 3.0*    No results for input(s): LIPASE, AMYLASE in the last 168 hours. No results for input(s): AMMONIA in the last 168 hours. Coagulation Profile: No results for input(s): INR, PROTIME in the last 168 hours. Cardiac Enzymes: No results for input(s): CKTOTAL, CKMB, CKMBINDEX, TROPONINI in the last 168  hours. BNP (last 3 results) No results for input(s): PROBNP in the last 8760 hours. HbA1C: No results for input(s): HGBA1C in the last 72 hours. CBG: No results for input(s): GLUCAP in the last 168 hours. Lipid Profile: No results for input(s): CHOL, HDL, LDLCALC, TRIG, CHOLHDL, LDLDIRECT in the last 72 hours. Thyroid Function Tests: Recent Labs    09/12/21 1254  TSH 1.325    Anemia Panel: No results for input(s): VITAMINB12, FOLATE, FERRITIN, TIBC, IRON, RETICCTPCT in the last 72 hours. Urine analysis:  Component Value Date/Time   COLORURINE YELLOW 09/12/2021 1254   APPEARANCEUR CLOUDY (A) 09/12/2021 1254   APPEARANCEUR Hazy (A) 09/22/2020 1521   LABSPEC 1.016 09/12/2021 1254   PHURINE 6.0 09/12/2021 1254   GLUCOSEU NEGATIVE 09/12/2021 1254   HGBUR SMALL (A) 09/12/2021 1254   BILIRUBINUR NEGATIVE 09/12/2021 1254   BILIRUBINUR Negative 09/22/2020 1521   KETONESUR NEGATIVE 09/12/2021 1254   PROTEINUR 100 (A) 09/12/2021 1254   UROBILINOGEN 0.2 06/09/2020 1536   NITRITE NEGATIVE 09/12/2021 1254   LEUKOCYTESUR LARGE (A) 09/12/2021 1254   Sepsis Labs: @LABRCNTIP (procalcitonin:4,lacticidven:4) ) Recent Results (from the past 240 hour(s))  Culture, Blood     Status: None (Preliminary result)   Collection Time: 09/12/21 12:54 PM   Specimen: BLOOD  Result Value Ref Range Status   Specimen Description BLOOD SITE NOT SPECIFIED  Final   Special Requests   Final    BOTTLES DRAWN AEROBIC AND ANAEROBIC Blood Culture adequate volume   Culture   Final    NO GROWTH 3 DAYS Performed at [facility], [address] [place], [place] [address]    Report Status PENDING  Incomplete  Urine Culture     Status: Abnormal   Collection Time: 09/12/21 12:54 PM   Specimen: Urine, Clean Catch  Result Value Ref Range Status   Specimen Description   Final    URINE, CLEAN CATCH Performed at [facility], [place] [address] [place], [place] [address]    Special  Requests   Final    Immunocompromised Performed at [facility], [place] [address] [place], [place] [address]    Culture MULTIPLE SPECIES PRESENT, SUGGEST RECOLLECTION (A)  Final   Report Status 09/13/2021 FINAL  Final  Culture, Blood     Status: None (Preliminary result)   Collection Time: 09/12/21 12:56 PM   Specimen: BLOOD  Result Value Ref Range Status   Specimen Description BLOOD LEFT ANTECUBITAL  Final   Special Requests   Final    BOTTLES DRAWN AEROBIC AND ANAEROBIC Blood Culture adequate volume   Culture   Final    NO GROWTH 3 DAYS Performed at [facility], [place] [address], [place], [place] [address]    Report Status PENDING  Incomplete  Resp Panel by RT-PCR (Flu A&B, Covid) Nasopharyngeal Swab     Status: None   Collection Time: 09/12/21  8:19 PM   Specimen: Nasopharyngeal Swab; Nasopharyngeal(NP) swabs in vial transport medium  Result Value Ref Range Status   SARS Coronavirus 2 by RT PCR NEGATIVE NEGATIVE Final    Comment: (NOTE) SARS-CoV-2 target nucleic acids are NOT DETECTED.  The SARS-CoV-2 RNA is generally detectable in upper respiratory specimens during the acute phase of infection. The lowest concentration of SARS-CoV-2 viral copies this assay can detect is 138 copies/mL. A negative result does not preclude SARS-Cov-2 infection and should not be used as the sole basis for treatment or other patient management decisions. A negative result may occur with  improper specimen collection/handling, submission of specimen other than nasopharyngeal swab, presence of viral mutation(s) within the areas targeted by this assay, and inadequate number of viral copies(<138 copies/mL). A negative result must be combined with clinical observations, patient history, and epidemiological information. The expected result is Negative.  Fact Sheet for Patients:  EntrepreneurPulse.com.au  Fact Sheet for Healthcare Providers:   IncredibleEmployment.be  This test is no t yet approved or cleared by the [place] FDA and  has been authorized for detection and/or  diagnosis of SARS-CoV-2 by FDA under an Emergency Use Authorization (EUA). This EUA will remain  in effect (meaning this test can be used) for the duration of the COVID-19 declaration under Section 564(b)(1) of the Act, 21 U.S.C.section 360bbb-3(b)(1), unless the authorization is terminated  or revoked sooner.       Influenza A by PCR NEGATIVE NEGATIVE Final   Influenza B by PCR NEGATIVE NEGATIVE Final    Comment: (NOTE) The Xpert Xpress SARS-CoV-2/FLU/RSV plus assay is intended as an aid in the diagnosis of influenza from Nasopharyngeal swab specimens and should not be used as a sole basis for treatment. Nasal washings and aspirates are unacceptable for Xpert Xpress SARS-CoV-2/FLU/RSV testing.  Fact Sheet for Patients: EntrepreneurPulse.com.au  Fact Sheet for Healthcare Providers: IncredibleEmployment.be  This test is not yet approved or cleared by the [place] FDA and has been authorized for detection and/or diagnosis of SARS-CoV-2 by FDA under an Emergency Use Authorization (EUA). This EUA will remain in effect (meaning this test can be used) for the duration of the COVID-19 declaration under Section 564(b)(1) of the Act, 21 U.S.C. section 360bbb-3(b)(1), unless the authorization is terminated or revoked.  Performed at [facility], [place] [address], [place], [place] [address]   MRSA Next Gen by PCR, Nasal     Status: None   Collection Time: 09/13/21  8:00 PM   Specimen: Nasal Mucosa; Nasal Swab  Result Value Ref Range Status   MRSA by PCR Next Gen NOT DETECTED NOT DETECTED Final    Comment: (NOTE) The GeneXpert MRSA Assay (FDA approved for NASAL specimens only), is one component of a comprehensive MRSA colonization surveillance program. It is not intended  to diagnose MRSA infection nor to guide or monitor treatment for MRSA infections. Test performance is not FDA approved in patients less than 86 years old. Performed at [facility], [place] [address], [place], [place] [address]          Radiology Studies: US RENAL  Result Date: 09/14/2021 CLINICAL DATA:  Fever cystectomy EXAM: RENAL / URINARY TRACT ULTRASOUND COMPLETE COMPARISON:  CT 09/13/2021 FINDINGS: Right Kidney: Renal measurements: 13.1 x 7.3 x 7.5 cm = volume: 371.1 mL. Echogenicity within normal limits. No mass or hydronephrosis visualized. Left Kidney: Renal measurements: 13.7 x 6.6 x 7.9 cm = volume: 372.2 mL. Echogenicity within normal limits. No mass or hydronephrosis visualized. Bladder: Surgically absent Other: None. IMPRESSION: 1. Generous sized kidneys which are otherwise normal in ultrasound appearance. Electronically Signed   By: Donavan Foil M.D.   On: 09/14/2021 18:47   DG Chest Port 1 View  Result Date: 09/14/2021 CLINICAL DATA:  Hypoxia, fever EXAM: PORTABLE CHEST 1 VIEW COMPARISON:  09/12/2021, 09/13/2021 FINDINGS: Single frontal view of the chest demonstrates stable right chest wall port. The cardiac silhouette is unremarkable. Progressive left infrahilar airspace disease consistent with bronchopneumonia. No effusion or pneumothorax. No acute bony abnormalities. IMPRESSION: 1. Left lower lobe bronchopneumonia. Electronically Signed   By: Randa Ngo M.D.   On: 09/14/2021 17:56      Scheduled Meds:  Chlorhexidine Gluconate Cloth  6 each Topical Q2000   enoxaparin (LOVENOX) injection  40 mg Subcutaneous Q24H   feeding supplement  237 mL Oral BID BM   ibuprofen  200 mg Oral Once   multivitamin with minerals  1 tablet Oral Daily   pantoprazole  40 mg Oral Daily   pravastatin  40 mg Oral Daily   sertraline  50 mg Oral Daily   Continuous  Infusions:  azithromycin Stopped (09/14/21 2001)   meropenem (MERREM) IV Stopped (09/15/21 0426)      LOS: 3 days      Debbe Odea, MD [facility] Pager: [email or web address] 09/15/2021, 11:38 AM

## 2021-09-15 NOTE — Plan of Care (Signed)

## 2021-09-16 DIAGNOSIS — A419 Sepsis, unspecified organism: Secondary | ICD-10-CM | POA: Diagnosis not present

## 2021-09-16 DIAGNOSIS — R6520 Severe sepsis without septic shock: Secondary | ICD-10-CM | POA: Diagnosis not present

## 2021-09-16 LAB — CBC
HCT: 23.5 % — ABNORMAL LOW (ref 39.0–52.0)
Hemoglobin: 7.3 g/dL — ABNORMAL LOW (ref 13.0–17.0)
MCH: 30.2 pg (ref 26.0–34.0)
MCHC: 31.1 g/dL (ref 30.0–36.0)
MCV: 97.1 fL (ref 80.0–100.0)
Platelets: 243 10*3/uL (ref 150–400)
RBC: 2.42 MIL/uL — ABNORMAL LOW (ref 4.22–5.81)
RDW: 19.6 % — ABNORMAL HIGH (ref 11.5–15.5)
WBC: 13.4 10*3/uL — ABNORMAL HIGH (ref 4.0–10.5)
nRBC: 0 % (ref 0.0–0.2)

## 2021-09-16 LAB — BASIC METABOLIC PANEL
Anion gap: 7 (ref 5–15)
BUN: 24 mg/dL — ABNORMAL HIGH (ref 6–20)
CO2: 21 mmol/L — ABNORMAL LOW (ref 22–32)
Calcium: 7.7 mg/dL — ABNORMAL LOW (ref 8.9–10.3)
Chloride: 103 mmol/L (ref 98–111)
Creatinine, Ser: 1.32 mg/dL — ABNORMAL HIGH (ref 0.61–1.24)
GFR, Estimated: >60 mL/min (ref >60)
Glucose, Bld: 105 mg/dL — ABNORMAL HIGH (ref 70–99)
Potassium: 3.4 mmol/L — ABNORMAL LOW (ref 3.5–5.1)
Sodium: 131 mmol/L — ABNORMAL LOW (ref 135–145)

## 2021-09-16 MED ORDER — LIP MEDEX EX OINT
TOPICAL_OINTMENT | CUTANEOUS | Status: AC
  Filled 2021-09-16: qty 7

## 2021-09-16 MED ORDER — POTASSIUM CHLORIDE CRYS ER 20 MEQ PO TBCR
40.0000 meq | EXTENDED_RELEASE_TABLET | ORAL | Status: AC
  Administered 2021-09-16 (×2): 40 meq via ORAL
  Filled 2021-09-16 (×2): qty 2

## 2021-09-16 MED ORDER — POTASSIUM CHLORIDE CRYS ER 20 MEQ PO TBCR: 40.0000 meq | EXTENDED_RELEASE_TABLET | Freq: Once | ORAL | Status: AC

## 2021-09-16 MED ORDER — TRAZODONE HCL 50 MG PO TABS
50.0000 mg | ORAL_TABLET | Freq: Every evening | ORAL | Status: DC | PRN
  Administered 2021-09-16: 50 mg via ORAL
  Filled 2021-09-16: qty 1

## 2021-09-16 NOTE — Progress Notes (Addendum)
PROGRESS NOTE    Barry Gibson   ASN:[id number]  DOB: 08/06/1961  DOA: 09/12/2021 PCP: [name], DO   Brief Narrative:  Barry Gibson is a 60 y.o. male with medical history significant for stage IV bladder cancer s/p robotic assisted laparoscopic radical cystectomy and lymphadenectomy January 2022 with ileal conduit, hypertension, rheumatoid arthritis who presented to the cancer center for evaluation of fevers, nausea, vomiting.  The patient states that last week on around Monday he developed a low-grade fever fatigue and generalized body aches.  This resolved within 3 days and for the next 5 days he felt well.  Unfortunately this past Tuesday, he developed severe nausea and subsequently began to have vomiting.  He was having fevers chills and sweats as well.    Subjective: He states he is feeling slightly better today. He is unable to specify how he feels better.   Assessment & Plan:   Principal Problem:   Severe sepsis with acute organ dysfunction in setting of bladder cancer and immune compromised state - secondary to UTI - lactic acid 1.3- improved to 0.8 - Procalcitonin 42.71 - suspected UTI  - unfortunately urine culture is showing multiple flora and this is likey because he has a urostomy - Due to ongoing fevers of 102, I changed Ceftriaxone to Ertapenem to cover for ESBL considering he is immune compromised and susceptible to resistant organisms - he is beginning to feel better- WBC improved from 60 to 38.4 - as urine culture is not consistent with a UTI, have changed Ertapenem back to Ceftriaxone to cover for CAP - blood cultures remain negative  Active Problems: Bronchopneumonia- CAP - LLL infiltrate noted on CXR on 10/28 which was not previously present on a 2 view CXR on 10/26 - started Azithromycin - fever curve and WBC counts improving - cont Imipenem  Hypokalemia - replacing and following  Anemia - Hb 7.3- follow for transfusion    Bladder  cancer ([place]) - receiving chemotherapy as outpt     AKI (acute kidney injury) with metabolic acidosis ([place]) - secondary to sepsis - Cr was rising cont IVF- given a 1 L of NS on 10/27 and then continuous IVF- Cr improving now and IVF discontinued - renal CT performed to 10/27-  no signs of obstruction or abscess     Time spent in minutes: 35 DVT prophylaxis: enoxaparin (LOVENOX) injection 40 mg Start: 09/12/21 2200  Code Status: Full code Family Communication:  Level of Care: Level of care: Med-Surg Disposition Plan:  Status is: Inpatient  Remains inpatient appropriate because: sepsis requiring IV antibiotics    Consultants:  none Procedures:  none Antimicrobials:  Anti-infectives (From admission, onward)    Start     Dose/Rate Route Frequency Ordered Stop   09/15/21 1400  cefTRIAXone (ROCEPHIN) 1 g in sodium chloride 0.9 % 100 mL IVPB        1 g 200 mL/hr over 30 Minutes Intravenous Every 24 hours 09/15/21 1142     09/15/21 0400  meropenem (MERREM) 1 g in sodium chloride 0.9 % 100 mL IVPB  Status:  Discontinued        1 g 200 mL/hr over 30 Minutes Intravenous Every 8 hours 09/14/21 2111 09/15/21 1142   09/14/21 2000  azithromycin (ZITHROMAX) 500 mg in sodium chloride 0.9 % 250 mL IVPB        500 mg 250 mL/hr over 60 Minutes Intravenous Every 24 hours 09/14/21 1813     09/14/21 1800  meropenem (MERREM)  1 g in sodium chloride 0.9 % 100 mL IVPB  Status:  Discontinued        1 g 200 mL/hr over 30 Minutes Intravenous Every 8 hours 09/14/21 1200 09/14/21 2111   09/13/21 0900  meropenem (MERREM) 1 g in sodium chloride 0.9 % 100 mL IVPB  Status:  Discontinued        1 g 200 mL/hr over 30 Minutes Intravenous Every 12 hours 09/13/21 0833 09/14/21 1200   09/12/21 2000  cefTRIAXone (ROCEPHIN) 2 g in sodium chloride 0.9 % 100 mL IVPB  Status:  Discontinued        2 g 200 mL/hr over 30 Minutes Intravenous Every 24 hours 09/12/21 1900 09/13/21 0816        Objective: Vitals:    09/16/21 0900 09/16/21 1000 09/16/21 1100 09/16/21 1200  BP:  (!) 149/84    Pulse: 88 91 93 90  Resp: 20 (!) 22 (!) 22 (!) 24  Temp:    98.1 F (36.7 C)  TempSrc:    Oral  SpO2: 96% 96% 95% 94%  Weight:      Height:        Intake/Output Summary (Last 24 hours) at 09/16/2021 1308 Last data filed at 09/16/2021 1200 Gross per 24 hour  Intake 1090 ml  Output 2225 ml  Net -1135 ml    Filed Weights   09/12/21 1830  Weight: 89.2 kg    Examination: General exam: Appears comfortable  HEENT: PERRLA, oral mucosa moist, no sclera icterus or thrush Respiratory system: Clear to auscultation. Respiratory effort normal. Cardiovascular system: S1 & S2 heard, regular rate and rhythm Gastrointestinal system: Abdomen soft, non-tender, nondistended. Normal bowel sounds- urostomy present Central nervous system: Alert and oriented. No focal neurological deficits. Extremities: No cyanosis, clubbing or edema Skin: No rashes or ulcers Psychiatry:  Mood & affect appropriate.        Data Reviewed: I have personally reviewed following labs and imaging studies  CBC: Recent Labs  Lab 09/12/21 1254 09/13/21 0604 09/14/21 0245 09/15/21 0353 09/16/21 0500  WBC 60.4* 54.7* 38.4* 21.7* 13.4*  NEUTROABS 54.9*  --   --   --   --   HGB 10.3* 9.9* 8.0* 7.9* 7.3*  HCT 31.4* 29.9* 24.9* 24.8* 23.5*  MCV 93.7 94.3 96.1 97.6 97.1  PLT 471* 397 294 258 657    Basic Metabolic Panel: Recent Labs  Lab 09/12/21 1254 09/13/21 0604 09/14/21 0245 09/15/21 0353 09/16/21 0500  NA 136 132* 132* 130* 131*  K 4.3 4.2 3.6 4.0 3.4*  CL 104 102 103 102 103  CO2 19* 20* 20* 21* 21*  GLUCOSE 130* 115* 101* 102* 105*  BUN 19 29* 28* 28* 24*  CREATININE 1.81* 2.01* 1.71* 1.47* 1.32*  CALCIUM 9.1 7.9* 7.7* 7.6* 7.7*    GFR: Estimated Creatinine Clearance: 65 mL/min (A) (by C-G formula based on SCr of 1.32 mg/dL (H)). Liver Function Tests: Recent Labs  Lab 09/12/21 1254  AST 13*  ALT 7  ALKPHOS 78   BILITOT 1.1  PROT 7.6  ALBUMIN 3.0*    No results for input(s): LIPASE, AMYLASE in the last 168 hours. No results for input(s): AMMONIA in the last 168 hours. Coagulation Profile: No results for input(s): INR, PROTIME in the last 168 hours. Cardiac Enzymes: No results for input(s): CKTOTAL, CKMB, CKMBINDEX, TROPONINI in the last 168 hours. BNP (last 3 results) No results for input(s): PROBNP in the last 8760 hours. HbA1C: No results for input(s): HGBA1C in the  last 72 hours. CBG: No results for input(s): GLUCAP in the last 168 hours. Lipid Profile: No results for input(s): CHOL, HDL, LDLCALC, TRIG, CHOLHDL, LDLDIRECT in the last 72 hours. Thyroid Function Tests: No results for input(s): TSH, T4TOTAL, FREET4, T3FREE, THYROIDAB in the last 72 hours.  Anemia Panel: No results for input(s): VITAMINB12, FOLATE, FERRITIN, TIBC, IRON, RETICCTPCT in the last 72 hours. Urine analysis:    Component Value Date/Time   COLORURINE YELLOW 09/12/2021 1254   APPEARANCEUR CLOUDY (A) 09/12/2021 1254   APPEARANCEUR Hazy (A) 09/22/2020 1521   LABSPEC 1.016 09/12/2021 1254   PHURINE 6.0 09/12/2021 1254   GLUCOSEU NEGATIVE 09/12/2021 1254   HGBUR SMALL (A) 09/12/2021 1254   BILIRUBINUR NEGATIVE 09/12/2021 1254   BILIRUBINUR Negative 09/22/2020 1521   KETONESUR NEGATIVE 09/12/2021 1254   PROTEINUR 100 (A) 09/12/2021 1254   UROBILINOGEN 0.2 06/09/2020 1536   NITRITE NEGATIVE 09/12/2021 1254   LEUKOCYTESUR LARGE (A) 09/12/2021 1254   Sepsis Labs: @LABRCNTIP (procalcitonin:4,lacticidven:4) ) Recent Results (from the past 240 hour(s))  Culture, Blood     Status: None (Preliminary result)   Collection Time: 09/12/21 12:54 PM   Specimen: BLOOD  Result Value Ref Range Status   Specimen Description BLOOD SITE NOT SPECIFIED  Final   Special Requests   Final    BOTTLES DRAWN AEROBIC AND ANAEROBIC Blood Culture adequate volume   Culture   Final    NO GROWTH 4 DAYS Performed at [facility], [address] [place], [place] [address]    Report Status PENDING  Incomplete  Urine Culture     Status: Abnormal   Collection Time: 09/12/21 12:54 PM   Specimen: Urine, Clean Catch  Result Value Ref Range Status   Specimen Description   Final    URINE, CLEAN CATCH Performed at [facility], [place] [address] [place], [place] [address]    Special Requests   Final    Immunocompromised Performed at [facility], [place] [address], [place], [place] [address]    Culture MULTIPLE SPECIES PRESENT, SUGGEST RECOLLECTION (A)  Final   Report Status 09/13/2021 FINAL  Final  Culture, Blood     Status: None (Preliminary result)   Collection Time: 09/12/21 12:56 PM   Specimen: BLOOD  Result Value Ref Range Status   Specimen Description BLOOD LEFT ANTECUBITAL  Final   Special Requests   Final    BOTTLES DRAWN AEROBIC AND ANAEROBIC Blood Culture adequate volume   Culture   Final    NO GROWTH 4 DAYS Performed at [facility], [place] [address] [place],  [address]    Report Status PENDING  Incomplete  Resp Panel by RT-PCR (Flu A&B, Covid) Nasopharyngeal Swab     Status: None   Collection Time: 09/12/21  8:19 PM   Specimen: Nasopharyngeal Swab; Nasopharyngeal(NP) swabs in vial transport medium  Result Value Ref Range Status   SARS Coronavirus 2 by RT PCR NEGATIVE NEGATIVE Final    Comment: (NOTE) SARS-CoV-2 target nucleic acids are NOT DETECTED.  The SARS-CoV-2 RNA is generally detectable in upper respiratory specimens during the acute phase of infection. The lowest concentration of SARS-CoV-2 viral copies this assay can detect is 138 copies/mL. A negative result does not preclude SARS-Cov-2 infection and should not be used as the sole basis for treatment or other patient management decisions. A negative result may occur with  improper specimen collection/handling, submission of specimen other than nasopharyngeal swab,  presence of viral mutation(s)  within the areas targeted by this assay, and inadequate number of viral copies(<138 copies/mL). A negative result must be combined with clinical observations, patient history, and epidemiological information. The expected result is Negative.  Fact Sheet for Patients:  EntrepreneurPulse.com.au  Fact Sheet for Healthcare Providers:  IncredibleEmployment.be  This test is no t yet approved or cleared by the [place] FDA and  has been authorized for detection and/or diagnosis of SARS-CoV-2 by FDA under an Emergency Use Authorization (EUA). This EUA will remain  in effect (meaning this test can be used) for the duration of the COVID-19 declaration under Section 564(b)(1) of the Act, 21 U.S.C.section 360bbb-3(b)(1), unless the authorization is terminated  or revoked sooner.       Influenza A by PCR NEGATIVE NEGATIVE Final   Influenza B by PCR NEGATIVE NEGATIVE Final    Comment: (NOTE) The Xpert Xpress SARS-CoV-2/FLU/RSV plus assay is intended as an aid in the diagnosis of influenza from Nasopharyngeal swab specimens and should not be used as a sole basis for treatment. Nasal washings and aspirates are unacceptable for Xpert Xpress SARS-CoV-2/FLU/RSV testing.  Fact Sheet for Patients: EntrepreneurPulse.com.au  Fact Sheet for Healthcare Providers: IncredibleEmployment.be  This test is not yet approved or cleared by the [place] FDA and has been authorized for detection and/or diagnosis of SARS-CoV-2 by FDA under an Emergency Use Authorization (EUA). This EUA will remain in effect (meaning this test can be used) for the duration of the COVID-19 declaration under Section 564(b)(1) of the Act, 21 U.S.C. section 360bbb-3(b)(1), unless the authorization is terminated or revoked.  Performed at [facility], [place] [address], [place], [place] [address]   MRSA  Next Gen by PCR, Nasal     Status: None   Collection Time: 09/13/21  8:00 PM   Specimen: Nasal Mucosa; Nasal Swab  Result Value Ref Range Status   MRSA by PCR Next Gen NOT DETECTED NOT DETECTED Final    Comment: (NOTE) The GeneXpert MRSA Assay (FDA approved for NASAL specimens only), is one component of a comprehensive MRSA colonization surveillance program. It is not intended to diagnose MRSA infection nor to guide or monitor treatment for MRSA infections. Test performance is not FDA approved in patients less than 4 years old. Performed at [facility], [place] [address] [place], [place] [address]          Radiology Studies: US RENAL  Result Date: 09/14/2021 CLINICAL DATA:  Fever cystectomy EXAM: RENAL / URINARY TRACT ULTRASOUND COMPLETE COMPARISON:  CT 09/13/2021 FINDINGS: Right Kidney: Renal measurements: 13.1 x 7.3 x 7.5 cm = volume: 371.1 mL. Echogenicity within normal limits. No mass or hydronephrosis visualized. Left Kidney: Renal measurements: 13.7 x 6.6 x 7.9 cm = volume: 372.2 mL. Echogenicity within normal limits. No mass or hydronephrosis visualized. Bladder: Surgically absent Other: None. IMPRESSION: 1. Generous sized kidneys which are otherwise normal in ultrasound appearance. Electronically Signed   By: Donavan Foil M.D.   On: 09/14/2021 18:47   DG Chest Port 1 View  Result Date: 09/14/2021 CLINICAL DATA:  Hypoxia, fever EXAM: PORTABLE CHEST 1 VIEW COMPARISON:  09/12/2021, 09/13/2021 FINDINGS: Single frontal view of the chest demonstrates stable right chest wall port. The cardiac silhouette is unremarkable. Progressive left infrahilar airspace disease consistent with bronchopneumonia. No effusion or pneumothorax. No acute bony abnormalities. IMPRESSION: 1. Left lower lobe bronchopneumonia. Electronically Signed   By: Randa Ngo M.D.   On: 09/14/2021 17:56      Scheduled Meds:  Chlorhexidine Gluconate  Cloth  6 each Topical Q2000   enoxaparin  (LOVENOX) injection  40 mg Subcutaneous Q24H   feeding supplement  237 mL Oral BID BM   ibuprofen  200 mg Oral Once   multivitamin with minerals  1 tablet Oral Daily   pantoprazole  40 mg Oral Daily   pravastatin  40 mg Oral Daily   sertraline  50 mg Oral Daily   Continuous Infusions:  azithromycin Stopped (09/15/21 2142)   cefTRIAXone (ROCEPHIN)  IV Stopped (09/15/21 1440)     LOS: 4 days      Debbe Odea, MD [facility] Pager: [email or web address] 09/16/2021, 1:08 PM

## 2021-09-16 NOTE — Plan of Care (Signed)
  Problem: Education: Goal: Knowledge of General Education information will improve Description: Including pain rating scale, medication(s)/side effects and non-pharmacologic comfort measures Outcome: Progressing   Problem: Clinical Measurements: Goal: Ability to maintain clinical measurements within normal limits will improve Outcome: Progressing Goal: Will remain free from infection Outcome: Progressing   

## 2021-09-17 DIAGNOSIS — J189 Pneumonia, unspecified organism: Secondary | ICD-10-CM

## 2021-09-17 DIAGNOSIS — R6520 Severe sepsis without septic shock: Secondary | ICD-10-CM | POA: Diagnosis not present

## 2021-09-17 DIAGNOSIS — A419 Sepsis, unspecified organism: Secondary | ICD-10-CM | POA: Diagnosis not present

## 2021-09-17 DIAGNOSIS — C679 Malignant neoplasm of bladder, unspecified: Secondary | ICD-10-CM

## 2021-09-17 LAB — CULTURE, BLOOD (SINGLE)
Culture: NO GROWTH
Culture: NO GROWTH
Special Requests: ADEQUATE
Special Requests: ADEQUATE

## 2021-09-17 LAB — BASIC METABOLIC PANEL
Anion gap: 7 (ref 5–15)
BUN: 20 mg/dL (ref 6–20)
CO2: 23 mmol/L (ref 22–32)
Calcium: 8 mg/dL — ABNORMAL LOW (ref 8.9–10.3)
Chloride: 101 mmol/L (ref 98–111)
Creatinine, Ser: 1.21 mg/dL (ref 0.61–1.24)
GFR, Estimated: >60 mL/min (ref >60)
Glucose, Bld: 98 mg/dL (ref 70–99)
Potassium: 3.9 mmol/L (ref 3.5–5.1)
Sodium: 131 mmol/L — ABNORMAL LOW (ref 135–145)

## 2021-09-17 LAB — HEMOGLOBIN AND HEMATOCRIT, BLOOD
HCT: 23.4 % — ABNORMAL LOW (ref 39.0–52.0)
Hemoglobin: 7.4 g/dL — ABNORMAL LOW (ref 13.0–17.0)

## 2021-09-17 LAB — CBC
HCT: 22.4 % — ABNORMAL LOW (ref 39.0–52.0)
Hemoglobin: 7.2 g/dL — ABNORMAL LOW (ref 13.0–17.0)
MCH: 30.4 pg (ref 26.0–34.0)
MCHC: 32.1 g/dL (ref 30.0–36.0)
MCV: 94.5 fL (ref 80.0–100.0)
Platelets: 237 10*3/uL (ref 150–400)
RBC: 2.37 MIL/uL — ABNORMAL LOW (ref 4.22–5.81)
RDW: 19.4 % — ABNORMAL HIGH (ref 11.5–15.5)
WBC: 11 10*3/uL — ABNORMAL HIGH (ref 4.0–10.5)
nRBC: 0 % (ref 0.0–0.2)

## 2021-09-17 LAB — LEGIONELLA PNEUMOPHILA SEROGP 1 UR AG: L. pneumophila Serogp 1 Ur Ag: NEGATIVE

## 2021-09-17 LAB — PREPARE RBC (CROSSMATCH)

## 2021-09-17 LAB — ABO/RH: ABO/RH(D): O POS

## 2021-09-17 MED ORDER — AZITHROMYCIN 500 MG PO TABS: 500.0000 mg | ORAL_TABLET | Freq: Every day | ORAL | 0 refills | Status: AC

## 2021-09-17 MED ORDER — ADULT MULTIVITAMIN W/MINERALS CH: 1.0000 | ORAL_TABLET | Freq: Every day | ORAL | Status: AC

## 2021-09-17 MED ORDER — SODIUM CHLORIDE 0.9% IV SOLUTION: Freq: Once | INTRAVENOUS | Status: AC

## 2021-09-17 MED ORDER — SODIUM CHLORIDE 0.9 % IV SOLN
1.0000 g | INTRAVENOUS | Status: DC
  Administered 2021-09-17: 1 g via INTRAVENOUS
  Filled 2021-09-17: qty 10

## 2021-09-17 MED ORDER — HEPARIN SOD (PORK) LOCK FLUSH 100 UNIT/ML IV SOLN
500.0000 [IU] | Freq: Once | INTRAVENOUS | Status: AC
  Administered 2021-09-17: 500 [IU] via INTRAVENOUS
  Filled 2021-09-17: qty 5

## 2021-09-17 MED ORDER — CEFUROXIME AXETIL 500 MG PO TABS: 500.0000 mg | ORAL_TABLET | Freq: Two times a day (BID) | ORAL | 0 refills | Status: DC

## 2021-09-17 MED ORDER — ENSURE ENLIVE PO LIQD: 237.0000 mL | Freq: Two times a day (BID) | ORAL | 12 refills | Status: DC

## 2021-09-17 NOTE — Discharge Summary (Signed)
Physician Discharge Summary  Barry Gibson QIO:[id number] DOB: Sep 21, 1961 DOA: 09/12/2021  PCP: Barry Colla, DO  Admit date: 09/12/2021 Discharge date: 09/17/2021  Admitted From: home  Disposition:  home   Recommendations for Outpatient Follow-up:  Legionella antigen is pending  Home Health:  not required  Discharge Condition:  stable   CODE STATUS:  Full code   Diet recommendation:  regular diet Consultations: none  Procedures/Studies: none   Discharge Diagnoses:  Principal Problem:   Severe sepsis with acute organ dysfunction ([place]) Active Problems:   CAP (community acquired pneumonia)   AKI (acute kidney injury) ([place])   Port-A-Cath in place   Essential hypertension, benign   Bladder cancer ([place])  Brief Summary: Barry Gibson is a 60 y.o. male with medical history significant for stage IV bladder cancer s/p robotic assisted laparoscopic radical cystectomy and lymphadenectomy January 2022 with ileal conduit, hypertension, rheumatoid arthritis who presented to the cancer center for evaluation of fevers, nausea, vomiting.  The patient states that last week on around Monday he developed a low-grade fever fatigue and generalized body aches.  This resolved within 3 days and for the next 5 days he felt well.  Unfortunately this past Tuesday, he developed severe nausea and subsequently began to have vomiting.  He was having fevers chills and sweats as well.   Hospital Course:  Principal Problem:   Severe sepsis with acute organ dysfunction in setting of bladder cancer and immune compromised state - secondary to UTI - lactic acid 1.3- improved to 0.8 - Procalcitonin 42.71 - suspected UTI  - unfortunately urine culture is showing multiple flora and this is likey because he has a urostomy - Due to ongoing fevers of 102, I changed Ceftriaxone to Ertapenem to cover for ESBL considering he is immune compromised and susceptible to resistant organisms - he is beginning to feel  better- WBC improved from 60 to 38.4 - as urine culture was not consistent with a UTI, have changed Ertapenem back to Ceftriaxone to cover for CAP - blood cultures remain negative - WBC count has improved to 11.4- fevers resolved- he is overall much improved and stable to dc home for Ceftin and Azithromycin to complete a 10 day course   Active Problems: Bronchopneumonia- CAP - LLL infiltrate noted on CXR on 10/28 which was not previously present on a 2 view CXR on 10/26 - started Azithromycin - fever curve and WBC counts improving - see above in regards to discharge plan   Hypokalemia - replacing and following   Anemia - Hb 7.3- follow for transfusion     Bladder cancer ([place]) - receiving chemotherapy as outpt      AKI (acute kidney injury) with metabolic acidosis ([place]) - secondary to sepsis - Cr was rising cont IVF- given a 1 L of NS on 10/27 and then continuous IVF- Cr improving now and IVF discontinued - renal CT performed to 10/27-  no signs of obstruction or abscess         Discharge Exam: Vitals:   09/17/21 1409 09/17/21 1435  BP: (!) 144/87 133/73  Pulse: 88 99  Resp: 18 15  Temp: 98.1 F (36.7 C)   SpO2: 92% 93%   Vitals:   09/17/21 0444 09/17/21 1324 09/17/21 1409 09/17/21 1435  BP: (!) 139/92 136/77 (!) 144/87 133/73  Pulse: 84 91 88 99  Resp: 20 17 18 15   Temp: 98.7 F (37.1 C) 98.1 F (36.7 C) 98.1 F (36.7 C)   TempSrc: Oral Oral  Oral Oral  SpO2: 93% 95% 92% 93%  Weight:      Height:        General: Pt is alert, awake, not in acute distress Cardiovascular: RRR, S1/S2 +, no rubs, no gallops Respiratory: CTA bilaterally, no wheezing, no rhonchi Abdominal: Soft, NT, ND, bowel sounds + Extremities: no edema, no cyanosis   Discharge Instructions  Discharge Instructions     Diet - low sodium heart healthy   Complete by: As directed    Increase activity slowly   Complete by: As directed       Allergies as of 09/17/2021       Reactions    Ace Inhibitors Cough   Diflucan [fluconazole] Other (See Comments)   Irritated ulcers        Medication List     TAKE these medications    acetaminophen 650 MG CR tablet Commonly known as: TYLENOL Take 1,300 mg by mouth every morning.   amLODipine 10 MG tablet Commonly known as: NORVASC Take 1 tablet (10 mg total) by mouth daily.   azithromycin 500 MG tablet Commonly known as: Zithromax Take 1 tablet (500 mg total) by mouth daily for 5 days. Take 1 tablet daily for 3 days.   cefUROXime 500 MG tablet Commonly known as: CEFTIN Take 1 tablet (500 mg total) by mouth 2 (two) times daily with a meal.   feeding supplement Liqd Take 237 mLs by mouth 2 (two) times daily between meals. Start taking on: September 18, 2021   lidocaine-prilocaine cream Commonly known as: EMLA Apply 1 application topically as needed.   multivitamin with minerals Tabs tablet Take 1 tablet by mouth daily. Start taking on: September 18, 2021   omeprazole 40 MG capsule Commonly known as: PRILOSEC Take 40 mg by mouth daily.   pravastatin 40 MG tablet Commonly known as: PRAVACHOL TAKE 1 TABLET(40 MG) BY MOUTH DAILY   prednisoLONE acetate 1 % ophthalmic suspension Commonly known as: PRED FORTE Place 1 drop into the left eye 2 (two) times a week. Instill 1 drop into left eye Monday and Friday.   prochlorperazine 10 MG tablet Commonly known as: COMPAZINE TAKE 1 TABLET(10 MG) BY MOUTH EVERY 6 HOURS AS NEEDED FOR NAUSEA OR VOMITING   sertraline 50 MG tablet Commonly known as: ZOLOFT TAKE 1 TABLET(50 MG) BY MOUTH DAILY        Allergies  Allergen Reactions   Ace Inhibitors Cough   Diflucan [Fluconazole] Other (See Comments)    Irritated ulcers      DG Chest 2 View  Result Date: 09/12/2021 CLINICAL DATA:  Fever.  Cancer patient. EXAM: CHEST - 2 VIEW COMPARISON:  CT chest dated August 31, 2021. FINDINGS: Unchanged right chest wall catheter. The heart size and mediastinal contours are  within normal limits. Normal pulmonary vascularity. No focal consolidation, pleural effusion, or pneumothorax. No acute osseous abnormality. IMPRESSION: No active cardiopulmonary disease. Electronically Signed   By: Titus Dubin M.D.   On: 09/12/2021 14:05   CT Chest W Contrast  Result Date: 09/03/2021 CLINICAL DATA:  Urologic cancer, assess treatment response in a 60 year old male. EXAM: CT CHEST, ABDOMEN, AND PELVIS WITH CONTRAST TECHNIQUE: Multidetector CT imaging of the chest, abdomen and pelvis was performed following the standard protocol during bolus administration of intravenous contrast. CONTRAST:  125mL OMNIPAQUE IOHEXOL 350 MG/ML SOLN COMPARISON:  April 19, 2021. FINDINGS: CT CHEST FINDINGS Cardiovascular: RIGHT-sided Port-A-Cath in situ terminates at the caval to atrial junction. Normal heart size without substantial pericardial effusion. Calcified  coronary artery disease. Calcified aortic atherosclerosis without aneurysm in the chest. Central pulmonary vasculature is normal caliber. Mediastinum/Nodes: No thoracic inlet, axillary, mediastinal or hilar adenopathy. Esophagus grossly normal. Lungs/Pleura: Lungs are clear.  Airways are patent. Musculoskeletal: See below for full musculoskeletal details. CT ABDOMEN PELVIS FINDINGS Hepatobiliary: No focal, suspicious hepatic lesion. No pericholecystic stranding. No biliary duct dilation. Portal vein is patent. Cholelithiasis as before. Pancreas: Normal, without mass, inflammation or ductal dilatation. Spleen: Normal spleen. Adrenals/Urinary Tract: Adrenal glands are normal. Symmetric renal enhancement with small cyst in the upper pole the RIGHT kidney. No hydronephrosis. Post cysto prostatectomy. Small cyst in the interpolar LEFT kidney and another arising from the lower pole. Reflux of air into the collecting systems of the kidneys by way of the conduit and ureters likely related to prone positioning and retrograde passage of gas from the patient's  ostomy bag into the urinary diversion. No perinephric stranding. Stomach/Bowel: Normal appendix. Colonic diverticulosis. Signs of small bowel resection for creation of ileal conduit as before. Vascular/Lymphatic: Resolution of adenopathy since the prior study in the retroperitoneum. (Image 83/3) 5 mm intra-aortocaval lymph node previously 15 mm. Similar change in size of a second intra-aortocaval lymph node adjacent to a retroaortic LEFT renal vein on image 84/3, currently approximately 4 mm previously approximately 15 mm. Small LEFT retrocrural lymph node (image 72/3) 5 mm previously 10 mm. Other lymph nodes with enlargement on the prior study show similar response to therapy without new signs of nodal disease. Aortic atherosclerotic changes with soft plaque in the infrarenal abdominal aorta which shows aneurysmal dilation measuring approximately 3.7 x 3.7 cm on the current study and 3.7 x 3.6 cm when measured by this observer in a similar fashion on the prior exam. Atherosclerotic plaque extends into branch vessels in the abdomen extending also in the iliac vessels. Note that the abdominal aorta measured approximally 3.6 x 3.4 cm greatest caliber in November of 2021. The IVC shows a smooth contour. Reproductive: Post cystoprostatectomy as above. Other: No ascites. Musculoskeletal: No acute musculoskeletal process. Spinal degenerative changes. IMPRESSION: Marked interval response to therapy as above without new or progressive findings. No evidence of metastatic disease to the chest. Post cystoprostatectomy and creation of urinary diversion. Reflux of air into the collecting systems with prone positioning may be of little clinical significance, more likely related to positioning and reflux of air from the ostomy bag into the urinary diversion. Correlate with any urinary symptoms and with urinalysis as warranted. Slight increase in size of and soft plaque within an infrarenal abdominal aortic aneurysm. Recommend  follow-up every 2 years. This recommendation follows ACR consensus guidelines: White Paper of the ACR Incidental Findings Committee II on Vascular Findings. J Am Coll Radiol 2013; 10:789-794. Cholelithiasis. Aortic Atherosclerosis (ICD10-I70.0). Electronically Signed   By: Zetta Bills M.D.   On: 09/03/2021 19:11   CT Abdomen Pelvis W Contrast  Result Date: 09/03/2021 CLINICAL DATA:  Urologic cancer, assess treatment response in a 60 year old male. EXAM: CT CHEST, ABDOMEN, AND PELVIS WITH CONTRAST TECHNIQUE: Multidetector CT imaging of the chest, abdomen and pelvis was performed following the standard protocol during bolus administration of intravenous contrast. CONTRAST:  125mL OMNIPAQUE IOHEXOL 350 MG/ML SOLN COMPARISON:  April 19, 2021. FINDINGS: CT CHEST FINDINGS Cardiovascular: RIGHT-sided Port-A-Cath in situ terminates at the caval to atrial junction. Normal heart size without substantial pericardial effusion. Calcified coronary artery disease. Calcified aortic atherosclerosis without aneurysm in the chest. Central pulmonary vasculature is normal caliber. Mediastinum/Nodes: No thoracic inlet, axillary, mediastinal or hilar  adenopathy. Esophagus grossly normal. Lungs/Pleura: Lungs are clear.  Airways are patent. Musculoskeletal: See below for full musculoskeletal details. CT ABDOMEN PELVIS FINDINGS Hepatobiliary: No focal, suspicious hepatic lesion. No pericholecystic stranding. No biliary duct dilation. Portal vein is patent. Cholelithiasis as before. Pancreas: Normal, without mass, inflammation or ductal dilatation. Spleen: Normal spleen. Adrenals/Urinary Tract: Adrenal glands are normal. Symmetric renal enhancement with small cyst in the upper pole the RIGHT kidney. No hydronephrosis. Post cysto prostatectomy. Small cyst in the interpolar LEFT kidney and another arising from the lower pole. Reflux of air into the collecting systems of the kidneys by way of the conduit and ureters likely related to prone  positioning and retrograde passage of gas from the patient's ostomy bag into the urinary diversion. No perinephric stranding. Stomach/Bowel: Normal appendix. Colonic diverticulosis. Signs of small bowel resection for creation of ileal conduit as before. Vascular/Lymphatic: Resolution of adenopathy since the prior study in the retroperitoneum. (Image 83/3) 5 mm intra-aortocaval lymph node previously 15 mm. Similar change in size of a second intra-aortocaval lymph node adjacent to a retroaortic LEFT renal vein on image 84/3, currently approximately 4 mm previously approximately 15 mm. Small LEFT retrocrural lymph node (image 72/3) 5 mm previously 10 mm. Other lymph nodes with enlargement on the prior study show similar response to therapy without new signs of nodal disease. Aortic atherosclerotic changes with soft plaque in the infrarenal abdominal aorta which shows aneurysmal dilation measuring approximately 3.7 x 3.7 cm on the current study and 3.7 x 3.6 cm when measured by this observer in a similar fashion on the prior exam. Atherosclerotic plaque extends into branch vessels in the abdomen extending also in the iliac vessels. Note that the abdominal aorta measured approximally 3.6 x 3.4 cm greatest caliber in November of 2021. The IVC shows a smooth contour. Reproductive: Post cystoprostatectomy as above. Other: No ascites. Musculoskeletal: No acute musculoskeletal process. Spinal degenerative changes. IMPRESSION: Marked interval response to therapy as above without new or progressive findings. No evidence of metastatic disease to the chest. Post cystoprostatectomy and creation of urinary diversion. Reflux of air into the collecting systems with prone positioning may be of little clinical significance, more likely related to positioning and reflux of air from the ostomy bag into the urinary diversion. Correlate with any urinary symptoms and with urinalysis as warranted. Slight increase in size of and soft plaque  within an infrarenal abdominal aortic aneurysm. Recommend follow-up every 2 years. This recommendation follows ACR consensus guidelines: White Paper of the ACR Incidental Findings Committee II on Vascular Findings. J Am Coll Radiol 2013; 10:789-794. Cholelithiasis. Aortic Atherosclerosis (ICD10-I70.0). Electronically Signed   By: Zetta Bills M.D.   On: 09/03/2021 19:11   US RENAL  Result Date: 09/14/2021 CLINICAL DATA:  Fever cystectomy EXAM: RENAL / URINARY TRACT ULTRASOUND COMPLETE COMPARISON:  CT 09/13/2021 FINDINGS: Right Kidney: Renal measurements: 13.1 x 7.3 x 7.5 cm = volume: 371.1 mL. Echogenicity within normal limits. No mass or hydronephrosis visualized. Left Kidney: Renal measurements: 13.7 x 6.6 x 7.9 cm = volume: 372.2 mL. Echogenicity within normal limits. No mass or hydronephrosis visualized. Bladder: Surgically absent Other: None. IMPRESSION: 1. Generous sized kidneys which are otherwise normal in ultrasound appearance. Electronically Signed   By: Donavan Foil M.D.   On: 09/14/2021 18:47   DG Chest Port 1 View  Result Date: 09/14/2021 CLINICAL DATA:  Hypoxia, fever EXAM: PORTABLE CHEST 1 VIEW COMPARISON:  09/12/2021, 09/13/2021 FINDINGS: Single frontal view of the chest demonstrates stable right chest wall port.  The cardiac silhouette is unremarkable. Progressive left infrahilar airspace disease consistent with bronchopneumonia. No effusion or pneumothorax. No acute bony abnormalities. IMPRESSION: 1. Left lower lobe bronchopneumonia. Electronically Signed   By: Randa Ngo M.D.   On: 09/14/2021 17:56   CT RENAL STONE STUDY  Result Date: 09/13/2021 CLINICAL DATA:  Acute renal failure with rising creatinine EXAM: CT ABDOMEN AND PELVIS WITHOUT CONTRAST TECHNIQUE: Multidetector CT imaging of the abdomen and pelvis was performed following the standard protocol without IV contrast. COMPARISON:  Thirteen days ago FINDINGS: Lower chest: Subpleural opacity with mild volume loss in the  left lower lobe. Coronary atherosclerosis. Hepatobiliary: No focal liver abnormality.Calcified gallstone. No right upper quadrant inflammation Pancreas: Unremarkable. Spleen: Unremarkable. Adrenals/Urinary Tract: Negative adrenals. Bilateral perinephric stranding which is nonspecific, although more prominent on the left. Nearly 14 cm long left kidney, previously 11.5 cm. Bilateral heterogeneous density of the kidneys. No hyperdense or expanded renal vein. Cystectomy with conduit. No hydronephrosis. Stomach/Bowel: Distal colonic diverticulosis. No evidence of bowel obstruction or inflammation. Vascular/Lymphatic: No acute vascular abnormality. Atherosclerosis and abdominal aortic aneurysm measuring up to 3.4 cm. Recommend follow-up every 3 years. This recommendation follows ACR consensus guidelines: White Paper of the ACR Incidental Findings Committee II on Vascular Findings. J Am Coll Radiol 2013; 10:789-794. No mass or adenopathy. Reproductive:No pathologic findings. Other: No ascites or pneumoperitoneum. Small fatty midline hernia above the umbilicus. Musculoskeletal: No acute abnormalities. IMPRESSION: 1. Heterogeneous density of the kidneys with perinephric stranding and increased renal size, suspect pyelonephritis. No hydronephrosis. 2. Left lower lobe opacity with volume loss suggesting atelectasis. 3. Cholelithiasis and colonic diverticulosis. Electronically Signed   By: Jorje Guild M.D.   On: 09/13/2021 10:28     The results of significant diagnostics from this hospitalization (including imaging, microbiology, ancillary and laboratory) are listed below for reference.     Microbiology: Recent Results (from the past 240 hour(s))  Culture, Blood     Status: None   Collection Time: 09/12/21 12:54 PM   Specimen: BLOOD  Result Value Ref Range Status   Specimen Description BLOOD SITE NOT SPECIFIED  Final   Special Requests   Final    BOTTLES DRAWN AEROBIC AND ANAEROBIC Blood Culture adequate volume    Culture   Final    NO GROWTH 5 DAYS Performed at [facility], [address] [place], [place] [address]    Report Status 09/17/2021 FINAL  Final  Urine Culture     Status: Abnormal   Collection Time: 09/12/21 12:54 PM   Specimen: Urine, Clean Catch  Result Value Ref Range Status   Specimen Description   Final    URINE, CLEAN CATCH Performed at [facility], [place] [address], [place], [place] [address]    Special Requests   Final    Immunocompromised Performed at [facility], [place] [address], [place], [place] [address]    Culture MULTIPLE SPECIES PRESENT, SUGGEST RECOLLECTION (A)  Final   Report Status 09/13/2021 FINAL  Final  Culture, Blood     Status: None   Collection Time: 09/12/21 12:56 PM   Specimen: BLOOD  Result Value Ref Range Status   Specimen Description BLOOD LEFT ANTECUBITAL  Final   Special Requests   Final    BOTTLES DRAWN AEROBIC AND ANAEROBIC Blood Culture adequate volume   Culture   Final    NO GROWTH 5 DAYS Performed at [facility], [place] [address] [place], [place] [address]    Report  Status 09/17/2021 FINAL  Final  Resp Panel by RT-PCR (Flu A&B, Covid) Nasopharyngeal Swab     Status: None   Collection Time: 09/12/21  8:19 PM   Specimen: Nasopharyngeal Swab; Nasopharyngeal(NP) swabs in vial transport medium  Result Value Ref Range Status   SARS Coronavirus 2 by RT PCR NEGATIVE NEGATIVE Final    Comment: (NOTE) SARS-CoV-2 target nucleic acids are NOT DETECTED.  The SARS-CoV-2 RNA is generally detectable in upper respiratory specimens during the acute phase of infection. The lowest concentration of SARS-CoV-2 viral copies this assay can detect is 138 copies/mL. A negative result does not preclude SARS-Cov-2 infection and should not be used as the sole basis for treatment or other patient management decisions. A negative result may occur with  improper specimen collection/handling, submission  of specimen other than nasopharyngeal swab, presence of viral mutation(s) within the areas targeted by this assay, and inadequate number of viral copies(<138 copies/mL). A negative result must be combined with clinical observations, patient history, and epidemiological information. The expected result is Negative.  Fact Sheet for Patients:  EntrepreneurPulse.com.au  Fact Sheet for Healthcare Providers:  IncredibleEmployment.be  This test is no t yet approved or cleared by the [place] FDA and  has been authorized for detection and/or diagnosis of SARS-CoV-2 by FDA under an Emergency Use Authorization (EUA). This EUA will remain  in effect (meaning this test can be used) for the duration of the COVID-19 declaration under Section 564(b)(1) of the Act, 21 U.S.C.section 360bbb-3(b)(1), unless the authorization is terminated  or revoked sooner.       Influenza A by PCR NEGATIVE NEGATIVE Final   Influenza B by PCR NEGATIVE NEGATIVE Final    Comment: (NOTE) The Xpert Xpress SARS-CoV-2/FLU/RSV plus assay is intended as an aid in the diagnosis of influenza from Nasopharyngeal swab specimens and should not be used as a sole basis for treatment. Nasal washings and aspirates are unacceptable for Xpert Xpress SARS-CoV-2/FLU/RSV testing.  Fact Sheet for Patients: EntrepreneurPulse.com.au  Fact Sheet for Healthcare Providers: IncredibleEmployment.be  This test is not yet approved or cleared by the [place] FDA and has been authorized for detection and/or diagnosis of SARS-CoV-2 by FDA under an Emergency Use Authorization (EUA). This EUA will remain in effect (meaning this test can be used) for the duration of the COVID-19 declaration under Section 564(b)(1) of the Act, 21 U.S.C. section 360bbb-3(b)(1), unless the authorization is terminated or revoked.  Performed at [facility], [place]  [address], [place], [place] [address]   MRSA Next Gen by PCR, Nasal     Status: None   Collection Time: 09/13/21  8:00 PM   Specimen: Nasal Mucosa; Nasal Swab  Result Value Ref Range Status   MRSA by PCR Next Gen NOT DETECTED NOT DETECTED Final    Comment: (NOTE) The GeneXpert MRSA Assay (FDA approved for NASAL specimens only), is one component of a comprehensive MRSA colonization surveillance program. It is not intended to diagnose MRSA infection nor to guide or monitor treatment for MRSA infections. Test performance is not FDA approved in patients less than 32 years old. Performed at [facility], [place] [address], [place], [place] [address]      Labs: BNP (last 3 results) No results for input(s): BNP in the last 8760 hours. Basic Metabolic Panel: Recent Labs  Lab 09/13/21 0604 09/14/21 0245 09/15/21 0353 09/16/21 0500 09/17/21 0803  NA 132* 132* 130* 131* 131*  K 4.2 3.6 4.0 3.4* 3.9  CL 102  103 102 103 101  CO2 20* 20* 21* 21* 23  GLUCOSE 115* 101* 102* 105* 98  BUN 29* 28* 28* 24* 20  CREATININE 2.01* 1.71* 1.47* 1.32* 1.21  CALCIUM 7.9* 7.7* 7.6* 7.7* 8.0*   Liver Function Tests: Recent Labs  Lab 09/12/21 1254  AST 13*  ALT 7  ALKPHOS 78  BILITOT 1.1  PROT 7.6  ALBUMIN 3.0*   No results for input(s): LIPASE, AMYLASE in the last 168 hours. No results for input(s): AMMONIA in the last 168 hours. CBC: Recent Labs  Lab 09/12/21 1254 09/12/21 1254 09/13/21 0604 09/14/21 0245 09/15/21 0353 09/16/21 0500 09/17/21 0803 09/17/21 1058  WBC 60.4*  --  54.7* 38.4* 21.7* 13.4* 11.0*  --   NEUTROABS 54.9*  --   --   --   --   --   --   --   HGB 10.3*   < > 9.9* 8.0* 7.9* 7.3* 7.2* 7.4*  HCT 31.4*  --  29.9* 24.9* 24.8* 23.5* 22.4* 23.4*  MCV 93.7  --  94.3 96.1 97.6 97.1 94.5  --   PLT 471*  --  397 294 258 243 237  --    < > = values in this interval not displayed.   Cardiac Enzymes: No results for input(s): CKTOTAL, CKMB, CKMBINDEX,  TROPONINI in the last 168 hours. BNP: Invalid input(s): POCBNP CBG: No results for input(s): GLUCAP in the last 168 hours. D-Dimer No results for input(s): DDIMER in the last 72 hours. Hgb A1c No results for input(s): HGBA1C in the last 72 hours. Lipid Profile No results for input(s): CHOL, HDL, LDLCALC, TRIG, CHOLHDL, LDLDIRECT in the last 72 hours. Thyroid function studies No results for input(s): TSH, T4TOTAL, T3FREE, THYROIDAB in the last 72 hours.  Invalid input(s): FREET3 Anemia work up No results for input(s): VITAMINB12, FOLATE, FERRITIN, TIBC, IRON, RETICCTPCT in the last 72 hours. Urinalysis    Component Value Date/Time   COLORURINE YELLOW 09/12/2021 1254   APPEARANCEUR CLOUDY (A) 09/12/2021 1254   APPEARANCEUR Hazy (A) 09/22/2020 1521   LABSPEC 1.016 09/12/2021 1254   PHURINE 6.0 09/12/2021 1254   GLUCOSEU NEGATIVE 09/12/2021 1254   HGBUR SMALL (A) 09/12/2021 1254   BILIRUBINUR NEGATIVE 09/12/2021 1254   BILIRUBINUR Negative 09/22/2020 1521   KETONESUR NEGATIVE 09/12/2021 1254   PROTEINUR 100 (A) 09/12/2021 1254   UROBILINOGEN 0.2 06/09/2020 1536   NITRITE NEGATIVE 09/12/2021 1254   LEUKOCYTESUR LARGE (A) 09/12/2021 1254   Sepsis Labs Invalid input(s): PROCALCITONIN,  WBC,  LACTICIDVEN Microbiology Recent Results (from the past 240 hour(s))  Culture, Blood     Status: None   Collection Time: 09/12/21 12:54 PM   Specimen: BLOOD  Result Value Ref Range Status   Specimen Description BLOOD SITE NOT SPECIFIED  Final   Special Requests   Final    BOTTLES DRAWN AEROBIC AND ANAEROBIC Blood Culture adequate volume   Culture   Final    NO GROWTH 5 DAYS Performed at [facility], [address], [place], [place] [address]    Report Status 09/17/2021 FINAL  Final  Urine Culture     Status: Abnormal   Collection Time: 09/12/21 12:54 PM   Specimen: Urine, Clean Catch  Result Value Ref Range Status   Specimen Description   Final    URINE, CLEAN  CATCH Performed at [facility], [place] [address] [place], [place] [address]    Special Requests   Final    Immunocompromised Performed at  [facility], [place] [address] [place], [place] [address]    Culture MULTIPLE SPECIES PRESENT, SUGGEST RECOLLECTION (A)  Final   Report Status 09/13/2021 FINAL  Final  Culture, Blood     Status: None   Collection Time: 09/12/21 12:56 PM   Specimen: BLOOD  Result Value Ref Range Status   Specimen Description BLOOD LEFT ANTECUBITAL  Final   Special Requests   Final    BOTTLES DRAWN AEROBIC AND ANAEROBIC Blood Culture adequate volume   Culture   Final    NO GROWTH 5 DAYS Performed at [facility], [place] [address], [place], [place] [address]    Report Status 09/17/2021 FINAL  Final  Resp Panel by RT-PCR (Flu A&B, Covid) Nasopharyngeal Swab     Status: None   Collection Time: 09/12/21  8:19 PM   Specimen: Nasopharyngeal Swab; Nasopharyngeal(NP) swabs in vial transport medium  Result Value Ref Range Status   SARS Coronavirus 2 by RT PCR NEGATIVE NEGATIVE Final    Comment: (NOTE) SARS-CoV-2 target nucleic acids are NOT DETECTED.  The SARS-CoV-2 RNA is generally detectable in upper respiratory specimens during the acute phase of infection. The lowest concentration of SARS-CoV-2 viral copies this assay can detect is 138 copies/mL. A negative result does not preclude SARS-Cov-2 infection and should not be used as the sole basis for treatment or other patient management decisions. A negative result may occur with  improper specimen collection/handling, submission of specimen other than nasopharyngeal swab, presence of viral mutation(s) within the areas targeted by this assay, and inadequate number of viral copies(<138 copies/mL). A negative result must be combined with clinical observations, patient history, and epidemiological information. The expected result is Negative.  Fact Sheet for  Patients:  EntrepreneurPulse.com.au  Fact Sheet for Healthcare Providers:  IncredibleEmployment.be  This test is no t yet approved or cleared by the [place] FDA and  has been authorized for detection and/or diagnosis of SARS-CoV-2 by FDA under an Emergency Use Authorization (EUA). This EUA will remain  in effect (meaning this test can be used) for the duration of the COVID-19 declaration under Section 564(b)(1) of the Act, 21 U.S.C.section 360bbb-3(b)(1), unless the authorization is terminated  or revoked sooner.       Influenza A by PCR NEGATIVE NEGATIVE Final   Influenza B by PCR NEGATIVE NEGATIVE Final    Comment: (NOTE) The Xpert Xpress SARS-CoV-2/FLU/RSV plus assay is intended as an aid in the diagnosis of influenza from Nasopharyngeal swab specimens and should not be used as a sole basis for treatment. Nasal washings and aspirates are unacceptable for Xpert Xpress SARS-CoV-2/FLU/RSV testing.  Fact Sheet for Patients: EntrepreneurPulse.com.au  Fact Sheet for Healthcare Providers: IncredibleEmployment.be  This test is not yet approved or cleared by the [place] FDA and has been authorized for detection and/or diagnosis of SARS-CoV-2 by FDA under an Emergency Use Authorization (EUA). This EUA will remain in effect (meaning this test can be used) for the duration of the COVID-19 declaration under Section 564(b)(1) of the Act, 21 U.S.C. section 360bbb-3(b)(1), unless the authorization is terminated or revoked.  Performed at [facility], [place] [address] [place], [place] [address]   MRSA Next Gen by PCR, Nasal     Status: None   Collection Time: 09/13/21  8:00 PM   Specimen: Nasal Mucosa; Nasal Swab  Result Value Ref Range Status   MRSA by PCR Next Gen NOT DETECTED NOT DETECTED Final    Comment: (NOTE) The GeneXpert  MRSA Assay (FDA approved for NASAL specimens only), is  one component of a comprehensive MRSA colonization surveillance program. It is not intended to diagnose MRSA infection nor to guide or monitor treatment for MRSA infections. Test performance is not FDA approved in patients less than 43 years old. Performed at [facility], [place] [address], [place],  [address]      Time coordinating discharge in minutes: 65  SIGNED:   Debbe Odea, MD  [facility] 09/17/2021, 2:59 PM

## 2021-09-17 NOTE — Progress Notes (Signed)
Blood consent obtained and placed in pts chart.

## 2021-09-18 ENCOUNTER — Telehealth: Payer: Self-pay

## 2021-09-18 DIAGNOSIS — Z79899 Other long term (current) drug therapy: Secondary | ICD-10-CM

## 2021-09-18 DIAGNOSIS — I10 Essential (primary) hypertension: Secondary | ICD-10-CM

## 2021-09-18 LAB — TYPE AND SCREEN
ABO/RH(D): O POS
Antibody Screen: NEGATIVE
Unit division: 0

## 2021-09-18 LAB — BPAM RBC
Blood Product Expiration Date: 202212012359
ISSUE DATE / TIME: 202210311409
Unit Type and Rh: 5100

## 2021-09-18 NOTE — Telephone Encounter (Signed)
Do I need to call patient? Please let me know. Thank you!

## 2021-09-18 NOTE — Telephone Encounter (Signed)
Called patient to do TCM call. Spoke with patient's wife, Barnett Applebaum. Pt was discharged from [facility] on 10/31 with dx of severe sepsis with acute organ dysfunction. Pt was advised to be seen this Friday by Hospitalist for follow up and blood work. Barnett Applebaum asks if pt could possibly be seen by you? Arjuna is a former pt of Dr. Tanna Furry. The earliest appointment Dr. Lacinda Axon has is 09/26/2021. Just let me know the best way to proceed. Thank you.

## 2021-09-18 NOTE — Telephone Encounter (Signed)
Unfortunately I am unable to accommodate due to full schedule I would recommend following up with the hospitalist as planned and follow-up with Dr. Lacinda Axon next week Recommend setting appointment thank you

## 2021-09-18 NOTE — Telephone Encounter (Signed)
Now that we have that clarification I would recommend the patient on Thursday due to CBC, metabolic 7, liver  Then the patient can do a follow-up visit on Friday at 9:20 AM then we will do a follow-up visit after that with Dr. Lacinda Axon who will be his regular family physician

## 2021-09-19 ENCOUNTER — Telehealth: Payer: Self-pay | Admitting: *Deleted

## 2021-09-19 NOTE — Telephone Encounter (Signed)
Lab orders placed and pt is aware. Pt scheduled for 9:20 am Friday with Dr.Scott. pt wife aware that in the future Dr.Cook will be patient PCP; pt wife verbalized understanding.

## 2021-09-19 NOTE — Addendum Note (Signed)
Addended by: Vicente Males on: 09/19/2021 09:07 AM   Modules accepted: Orders

## 2021-09-19 NOTE — Telephone Encounter (Signed)
Notified that Dr Alen Blew is OK with Barry Gibson receiving steroids. To contact rheumatologist. Verbalized understanding

## 2021-09-19 NOTE — Telephone Encounter (Signed)
Wife states Barry Gibson is having a flair up of RA in his shoulders, cannot raise arms and he is not able to do anything for himself. He is doing much better with everything else post discharge. They are asking for a round of steroids to help him with his shoulders, he is very frustrated.

## 2021-09-20 ENCOUNTER — Telehealth: Payer: Self-pay

## 2021-09-20 NOTE — Telephone Encounter (Signed)
Transition Care Management Follow-up Telephone Call Date of discharge and from where: 09/17/2021 / [facility] How have you been since you were released from the hospital? " Getting stronger every day" Any questions or concerns? No  Items Reviewed: Did the pt receive and understand the discharge instructions provided? Yes  Medications obtained and verified? Yes  Other? No  Any new allergies since your discharge? No  Dietary orders reviewed? Yes Do you have support at home? Yes   Home Care and Equipment/Supplies: Were home health services ordered? no If so, what is the name of the agency? N/a  Has the agency set up a time to come to the patient's home? not applicable Were any new equipment or medical supplies ordered?  No What is the name of the medical supply agency? N/a Were you able to get the supplies/equipment? not applicable Do you have any questions related to the use of the equipment or supplies? No  Functional Questionnaire: (I = Independent and D = Dependent) ADLs: I  Bathing/Dressing- I  Meal Prep- I  Eating- I  Maintaining continence- I  Transferring/Ambulation- I  Managing Meds- I  Follow up appointments reviewed:  PCP Hospital f/u appt confirmed? Yes  Scheduled to see Dr. Sallee Lange  on 09/21/2021 @ 9:20 am. [facility] f/u appt confirmed? Yes  Scheduled to see Dr. Reina Fuse on 09/28/2021 @ 11:00 am. Are transportation arrangements needed? Yes  If their condition worsens, is the pt aware to call PCP or go to the Emergency Dept.? Yes Was the patient provided with contact information for the PCP's office or ED? Yes Was to pt encouraged to call back with questions or concerns? Yes   Quinn Plowman RN,BSN,CCM RN Case Manager [place]   [phone]

## 2021-09-21 ENCOUNTER — Encounter: Payer: Self-pay | Admitting: Oncology

## 2021-09-21 ENCOUNTER — Encounter: Payer: Self-pay | Admitting: Family Medicine

## 2021-09-21 ENCOUNTER — Other Ambulatory Visit: Payer: Self-pay

## 2021-09-21 ENCOUNTER — Ambulatory Visit (INDEPENDENT_AMBULATORY_CARE_PROVIDER_SITE_OTHER): Payer: BC Managed Care – PPO | Admitting: Family Medicine

## 2021-09-21 VITALS — BP 126/74 | HR 90 | Temp 97.2°F | Ht 71.0 in | Wt 200.0 lb

## 2021-09-21 DIAGNOSIS — J189 Pneumonia, unspecified organism: Secondary | ICD-10-CM | POA: Diagnosis not present

## 2021-09-21 DIAGNOSIS — I7143 Infrarenal abdominal aortic aneurysm, without rupture: Secondary | ICD-10-CM

## 2021-09-21 LAB — HEPATIC FUNCTION PANEL
ALT: 16 IU/L (ref 0–44)
AST: 11 IU/L (ref 0–40)
Albumin: 3.1 g/dL — ABNORMAL LOW (ref 3.8–4.9)
Alkaline Phosphatase: 82 IU/L (ref 44–121)
Bilirubin Total: <0.2 mg/dL (ref 0.0–1.2)
Bilirubin, Direct: <0.1 mg/dL (ref 0.00–0.40)
Total Protein: 6.7 g/dL (ref 6.0–8.5)

## 2021-09-21 LAB — CBC WITH DIFFERENTIAL/PLATELET
Basophils Absolute: 0.1 10*3/uL (ref 0.0–0.2)
Basos: 1 %
EOS (ABSOLUTE): 0.2 10*3/uL (ref 0.0–0.4)
Eos: 2 %
Hematocrit: 28.1 % — ABNORMAL LOW (ref 37.5–51.0)
Hemoglobin: 9.1 g/dL — ABNORMAL LOW (ref 13.0–17.7)
Immature Grans (Abs): 0.2 10*3/uL — ABNORMAL HIGH (ref 0.0–0.1)
Immature Granulocytes: 1 %
Lymphocytes Absolute: 2 10*3/uL (ref 0.7–3.1)
Lymphs: 15 %
MCH: 30.1 pg (ref 26.6–33.0)
MCHC: 32.4 g/dL (ref 31.5–35.7)
MCV: 93 fL (ref 79–97)
Monocytes Absolute: 0.8 10*3/uL (ref 0.1–0.9)
Monocytes: 6 %
Neutrophils Absolute: 10.3 10*3/uL — ABNORMAL HIGH (ref 1.4–7.0)
Neutrophils: 75 %
Platelets: 452 10*3/uL — ABNORMAL HIGH (ref 150–450)
RBC: 3.02 x10E6/uL — ABNORMAL LOW (ref 4.14–5.80)
RDW: 16.9 % — ABNORMAL HIGH (ref 11.6–15.4)
WBC: 13.5 10*3/uL — ABNORMAL HIGH (ref 3.4–10.8)

## 2021-09-21 LAB — BASIC METABOLIC PANEL
BUN/Creatinine Ratio: 17 (ref 9–20)
BUN: 21 mg/dL (ref 6–24)
CO2: 21 mmol/L (ref 20–29)
Calcium: 8.7 mg/dL (ref 8.7–10.2)
Chloride: 104 mmol/L (ref 96–106)
Creatinine, Ser: 1.27 mg/dL (ref 0.76–1.27)
Glucose: 95 mg/dL (ref 70–99)
Potassium: 4.7 mmol/L (ref 3.5–5.2)
Sodium: 138 mmol/L (ref 134–144)
eGFR: 65 mL/min/{1.73_m2} (ref >59)

## 2021-09-21 MED ORDER — ROSUVASTATIN CALCIUM 20 MG PO TABS
20.0000 mg | ORAL_TABLET | Freq: Every day | ORAL | 2 refills | Status: DC
Start: 2021-09-21 — End: 2022-03-18

## 2021-09-21 NOTE — Progress Notes (Signed)
Subjective:    Patient ID: Barry Gibson, male    DOB: 01-19-1961, 60 y.o.   MRN: [id number]  HPI  Hospitalization follow up  Patient was in the hospital with sepsis as well as having significant setback to his overall health he is in the process of battling cancer but became septic at first it was thought to be due to urinary symptoms but then felt to be due to pneumonia could have seeded the lung from the bladder hard to tell.  Nonetheless he was treated with IV antibiotics did have some electrolyte problems while in the hospital pneumonia showed on the CAT scan not on the chest x-ray no additional issues were seen other than a mild aneurysm but it is not at the level that needs surgery.  But does need following. Hospital notes scans labs reviewed with patient questions answered Review of Systems     Objective:   Physical Exam General-in no acute distress Eyes-no discharge Lungs-respiratory rate normal, CTA CV-no murmurs,RRR Extremities skin warm dry no edema Neuro grossly normal Behavior normal, alert  Results for orders placed or performed in visit on 09/18/21  CBC with Differential  Result Value Ref Range   WBC 13.5 (H) 3.4 - 10.8 x10E3/uL   RBC 3.02 (L) 4.14 - 5.80 x10E6/uL   Hemoglobin 9.1 (L) 13.0 - 17.7 g/dL   Hematocrit 28.1 (L) 37.5 - 51.0 %   MCV 93 79 - 97 fL   MCH 30.1 26.6 - 33.0 pg   MCHC 32.4 31.5 - 35.7 g/dL   RDW 16.9 (H) 11.6 - 15.4 %   Platelets 452 (H) 150 - 450 x10E3/uL   Neutrophils 75 Not Estab. %   Lymphs 15 Not Estab. %   Monocytes 6 Not Estab. %   Eos 2 Not Estab. %   Basos 1 Not Estab. %   Neutrophils Absolute 10.3 (H) 1.4 - 7.0 x10E3/uL   Lymphocytes Absolute 2.0 0.7 - 3.1 x10E3/uL   Monocytes Absolute 0.8 0.1 - 0.9 x10E3/uL   EOS (ABSOLUTE) 0.2 0.0 - 0.4 x10E3/uL   Basophils Absolute 0.1 0.0 - 0.2 x10E3/uL   Immature Granulocytes 1 Not Estab. %   Immature Grans (Abs) 0.2 (H) 0.0 - 0.1 F35K5/GY  Basic Metabolic Panel (BMET)  Result  Value Ref Range   Glucose 95 70 - 99 mg/dL   BUN 21 6 - 24 mg/dL   Creatinine, Ser 1.27 0.76 - 1.27 mg/dL   eGFR 65 >59 mL/min/1.73   BUN/Creatinine Ratio 17 9 - 20   Sodium 138 134 - 144 mmol/L   Potassium 4.7 3.5 - 5.2 mmol/L   Chloride 104 96 - 106 mmol/L   CO2 21 20 - 29 mmol/L   Calcium 8.7 8.7 - 10.2 mg/dL  Hepatic function panel  Result Value Ref Range   Total Protein 6.7 6.0 - 8.5 g/dL   Albumin 3.1 (L) 3.8 - 4.9 g/dL   Bilirubin Total <0.2 0.0 - 1.2 mg/dL   Bilirubin, Direct <0.10 0.00 - 0.40 mg/dL   Alkaline Phosphatase 82 44 - 121 IU/L   AST 11 0 - 40 IU/L   ALT 16 0 - 44 IU/L         Assessment & Plan:  We will put into the reminder file for the patient to have a ultrasound of the aorta in October 2020 for more than likely will have repetitive scans between then with his cancer doctor  We will touch base with his cancer doctor to see  if flu vaccine and pneumonia vaccine are permissible for his situation currently with the treatments he is having  Recommend a follow-up chest x-ray in 3 to 4 weeks  Follow-up with Dr. Lacinda Axon in approximately 6 weeks  Follow-up lab work overall looked good.  Cancer center will do follow-up CBCs in the future which will monitor his white count  If patient has any setbacks, symptoms of sepsis were reviewed in detail, follow-up ASAP

## 2021-09-21 NOTE — Patient Instructions (Addendum)
Please do your chest xray at the hospital in 4 weeks  See Dr Lacinda Axon in 6 weeks  Stop pravastatin Start crestor 20 mg daily  We will recheck the aorta in 2 years But will see with your other scans If problems please call or send [place] a message

## 2021-09-25 ENCOUNTER — Telehealth: Payer: Self-pay | Admitting: Family Medicine

## 2021-09-25 NOTE — Telephone Encounter (Signed)
Nurses  To clarify previous statement  The patient is due for a repeat abdominal CT scan in 2 years time in order to see how the aneurysm is doing. Please put this into the reminder file

## 2021-09-25 NOTE — Telephone Encounter (Signed)
Clarification  Ultrasound of the abdominal aneurysm 2 years time please put into the reminder file

## 2021-09-25 NOTE — Telephone Encounter (Deleted)
-----   Message from Dairl Ponder, RN sent at 09/25/2021  8:34 AM EST ----- Please clarify when aorta u/s to be repeated? ----- Message ----- From: Kathyrn Drown, MD Sent: 09/22/2021   9:38 AM EST To: [facility] Clinical Pool  Nurses-patient needs to do a follow-up chest x-ray in 3 to 4 weeks, please send him a MyChart message reminding him to do this, reason is follow-up on pneumonia, also put into the reminder file for October 2020 for to have a ultrasound of the aorta due to aneurysm

## 2021-09-26 ENCOUNTER — Inpatient Hospital Stay: Payer: BC Managed Care – PPO | Admitting: Family Medicine

## 2021-09-26 NOTE — Telephone Encounter (Signed)
Per Dr Nicki Reaper:  Clarification   Ultrasound of the abdominal aneurysm 2 years time please put into the reminder file

## 2021-09-26 NOTE — Telephone Encounter (Signed)
Repeat ultrasound added to reminder file

## 2021-09-28 ENCOUNTER — Other Ambulatory Visit: Payer: Self-pay

## 2021-09-28 ENCOUNTER — Inpatient Hospital Stay: Payer: BC Managed Care – PPO | Admitting: Dietician

## 2021-09-28 ENCOUNTER — Inpatient Hospital Stay: Payer: BC Managed Care – PPO | Attending: Oncology

## 2021-09-28 ENCOUNTER — Inpatient Hospital Stay (HOSPITAL_BASED_OUTPATIENT_CLINIC_OR_DEPARTMENT_OTHER): Payer: BC Managed Care – PPO | Admitting: Oncology

## 2021-09-28 ENCOUNTER — Inpatient Hospital Stay: Payer: BC Managed Care – PPO

## 2021-09-28 VITALS — BP 128/79 | HR 68 | Temp 97.1°F | Resp 17 | Ht 71.0 in | Wt 197.7 lb

## 2021-09-28 DIAGNOSIS — C778 Secondary and unspecified malignant neoplasm of lymph nodes of multiple regions: Secondary | ICD-10-CM | POA: Insufficient documentation

## 2021-09-28 DIAGNOSIS — Z79899 Other long term (current) drug therapy: Secondary | ICD-10-CM | POA: Insufficient documentation

## 2021-09-28 DIAGNOSIS — Z5112 Encounter for antineoplastic immunotherapy: Secondary | ICD-10-CM | POA: Diagnosis present

## 2021-09-28 DIAGNOSIS — C670 Malignant neoplasm of trigone of bladder: Secondary | ICD-10-CM

## 2021-09-28 DIAGNOSIS — M069 Rheumatoid arthritis, unspecified: Secondary | ICD-10-CM | POA: Diagnosis not present

## 2021-09-28 DIAGNOSIS — Z95828 Presence of other vascular implants and grafts: Secondary | ICD-10-CM

## 2021-09-28 DIAGNOSIS — C679 Malignant neoplasm of bladder, unspecified: Secondary | ICD-10-CM

## 2021-09-28 LAB — CBC WITH DIFFERENTIAL (CANCER CENTER ONLY)
Abs Immature Granulocytes: 0.22 10*3/uL — ABNORMAL HIGH (ref 0.00–0.07)
Basophils Absolute: 0.1 10*3/uL (ref 0.0–0.1)
Basophils Relative: 1 %
Eosinophils Absolute: 0.1 10*3/uL (ref 0.0–0.5)
Eosinophils Relative: 1 %
HCT: 32.2 % — ABNORMAL LOW (ref 39.0–52.0)
Hemoglobin: 9.9 g/dL — ABNORMAL LOW (ref 13.0–17.0)
Immature Granulocytes: 2 %
Lymphocytes Relative: 10 %
Lymphs Abs: 1.5 10*3/uL (ref 0.7–4.0)
MCH: 30.1 pg (ref 26.0–34.0)
MCHC: 30.7 g/dL (ref 30.0–36.0)
MCV: 97.9 fL (ref 80.0–100.0)
Monocytes Absolute: 0.8 10*3/uL (ref 0.1–1.0)
Monocytes Relative: 6 %
Neutro Abs: 12.1 10*3/uL — ABNORMAL HIGH (ref 1.7–7.7)
Neutrophils Relative %: 80 %
Platelet Count: 565 10*3/uL — ABNORMAL HIGH (ref 150–400)
RBC: 3.29 MIL/uL — ABNORMAL LOW (ref 4.22–5.81)
RDW: 18.9 % — ABNORMAL HIGH (ref 11.5–15.5)
WBC Count: 14.8 10*3/uL — ABNORMAL HIGH (ref 4.0–10.5)
nRBC: 0 % (ref 0.0–0.2)

## 2021-09-28 LAB — TSH: TSH: 0.85 u[IU]/mL (ref 0.320–4.118)

## 2021-09-28 LAB — CMP (CANCER CENTER ONLY)
ALT: 16 U/L (ref 0–44)
AST: 14 U/L — ABNORMAL LOW (ref 15–41)
Albumin: 3 g/dL — ABNORMAL LOW (ref 3.5–5.0)
Alkaline Phosphatase: 76 U/L (ref 38–126)
Anion gap: 8 (ref 5–15)
BUN: 24 mg/dL — ABNORMAL HIGH (ref 6–20)
CO2: 25 mmol/L (ref 22–32)
Calcium: 8.8 mg/dL — ABNORMAL LOW (ref 8.9–10.3)
Chloride: 105 mmol/L (ref 98–111)
Creatinine: 1.2 mg/dL (ref 0.61–1.24)
GFR, Estimated: >60 mL/min (ref >60)
Glucose, Bld: 94 mg/dL (ref 70–99)
Potassium: 4.7 mmol/L (ref 3.5–5.1)
Sodium: 138 mmol/L (ref 135–145)
Total Bilirubin: 0.2 mg/dL — ABNORMAL LOW (ref 0.3–1.2)
Total Protein: 7.2 g/dL (ref 6.5–8.1)

## 2021-09-28 MED ORDER — SODIUM CHLORIDE 0.9 % IV SOLN
400.0000 mg | Freq: Once | INTRAVENOUS | Status: AC
  Administered 2021-09-28: 400 mg via INTRAVENOUS
  Filled 2021-09-28: qty 16

## 2021-09-28 MED ORDER — HEPARIN SOD (PORK) LOCK FLUSH 100 UNIT/ML IV SOLN
500.0000 [IU] | Freq: Once | INTRAVENOUS | Status: AC | PRN
  Administered 2021-09-28: 500 [IU]

## 2021-09-28 MED ORDER — SODIUM CHLORIDE 0.9% FLUSH
10.0000 mL | Freq: Once | INTRAVENOUS | Status: AC
  Administered 2021-09-28: 10 mL

## 2021-09-28 MED ORDER — SODIUM CHLORIDE 0.9 % IV SOLN: Freq: Once | INTRAVENOUS | Status: AC

## 2021-09-28 MED ORDER — SODIUM CHLORIDE 0.9% FLUSH
10.0000 mL | INTRAVENOUS | Status: DC | PRN
  Administered 2021-09-28: 10 mL

## 2021-09-28 NOTE — Progress Notes (Signed)
Nutrition Assessment   Reason for Assessment: MST (+wt loss)   ASSESSMENT: 60 year old male with stage IV bladder cancer. He is currently receiving Keytruda. Patient is followed by Dr. Alen Blew  Past medical history includes RA, HLD, HTN.  -Noted recent hospitalization 73/73-66/81 with complicated UTI/ureosepsis  Met with patient during infusion. Patient reports decreased appetite, eating very little a few days prior and throughout admission due to feeling poorly. Patient reports feeling much better, energy levels have improved, his appetite is "great" and "eating all the time" since returning home from hospital. Patient recalls 3 meals and frequent snacking (fruity pepples, captain crunch, oatmeal, brunswick stew, grilled cheese, chips, BBQ, steak, baked potato, salad, fudge). He is drinking mostly water and some sweet tea.  He denies nutrition impact symptoms.    Nutrition Focused Physical Exam: deferred   Medications: MVI, Compazine, Ceftin   Labs: BUN 24   Anthropometrics:   Height: 5'11" Weight: 197 lb 11.2 oz UBW: 206-210 lb (per pt) BMI: 27.57   NUTRITION DIAGNOSIS: No nutrition diagnosis at this time   INTERVENTION:  Encouraged high calorie, high protein foods for weight maintenance  Suggested oral nutrition supplement as needed with decreased appetite Contact information provided   Next Visit: No follow-up scheduled, pt encouraged to contact with nutrition questions or concerns

## 2021-09-28 NOTE — Patient Instructions (Signed)
[facility]ICAL [facility]COLOGY  Discharge Instructions: ?Thank you for choosing [facility] to provide your oncology and hematology care.  ? ?If you have a lab appointment with the Cancer Center, please go directly to the Cancer Center and check in at the registration area. ?  ?Wear comfortable clothing and clothing appropriate for easy access to any Portacath or PICC line.  ? ?We strive to give you quality time with your provider. You may need to reschedule your appointment if you arrive late (15 or more minutes).  Arriving late affects you and other patients whose appointments are after yours.  Also, if you miss three or more appointments without notifying the office, you may be dismissed from the clinic at the provider?s discretion.    ?  ?For prescription refill requests, have your pharmacy contact our office and allow 72 hours for refills to be completed.   ? ?Today you received the following chemotherapy and/or immunotherapy agents: Keytruda ?  ?To help prevent nausea and vomiting after your treatment, we encourage you to take your nausea medication as directed. ? ?BELOW ARE SYMPTOMS THAT SHOULD BE REPORTED IMMEDIATELY: ?*FEVER GREATER THAN 100.4 F (38 ?C) OR HIGHER ?*CHILLS OR SWEATING ?*NAUSEA AND VOMITING THAT IS NOT CONTROLLED WITH YOUR NAUSEA MEDICATION ?*UNUSUAL SHORTNESS OF BREATH ?*UNUSUAL BRUISING OR BLEEDING ?*URINARY PROBLEMS (pain or burning when urinating, or frequent urination) ?*BOWEL PROBLEMS (unusual diarrhea, constipation, pain near the anus) ?TENDERNESS IN MOUTH AND THROAT WITH OR WITHOUT PRESENCE OF ULCERS (sore throat, sores in mouth, or a toothache) ?UNUSUAL RASH, SWELLING OR PAIN  ?UNUSUAL VAGINAL DISCHARGE OR ITCHING  ? ?Items with * indicate a potential emergency and should be followed up as soon as possible or go to the Emergency Department if any problems should occur. ? ?Please show the CHEMOTHERAPY ALERT CARD or IMMUNOTHERAPY ALERT CARD at check-in to the  Emergency Department and triage nurse. ? ?Should you have questions after your visit or need to cancel or reschedule your appointment, please contact [facility]  Dept: [phone]  and follow the prompts.  Office hours are 8:00 a.m. to 4:30 p.m. Monday - Friday. Please note that voicemails left after 4:00 p.m. may not be returned until the following business day.  We are closed weekends and major holidays. You have access to a nurse at all times for urgent questions. Please call the main number to the clinic Dept: [phone] and follow the prompts. ? ? ?For any non-urgent questions, you may also contact your provider using MyChart. We now offer e-Visits for anyone 18 and older to request care online for non-urgent symptoms. For details visit [email or web address]. ?  ?Also download the MyChart app! Go to the app store, search "MyChart", open the app, select [facility], and log in with your MyChart username and password. ? ?Due to Covid, a mask is required upon entering the hospital/clinic. If you do not have a mask, one will be given to you upon arrival. For doctor visits, patients may have 1 support person aged 18 or older with them. For treatment visits, patients cannot have anyone with them due to current Covid guidelines and our immunocompromised population.  ? ?

## 2021-09-28 NOTE — Progress Notes (Signed)
Hematology and Oncology Follow Up Visit  Barry Gibson [id number] 03-24-1961 60 y.o. 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   Prior Therapy:   He underwent robotic assisted laparoscopic radical cystectomy and lymphadenectomy January 2022.  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  He had 4 out of 7 lymph nodes involved with the final pathological staging is T4N2 disease.     He underwent staging scans on April 19, 2021 which showed interval progression of abdominal pelvic adenopathy consistent with worsening nodal metastasis.    Chemotherapy utilizing gemcitabine and cisplatin started on June 08, 2021 receiving day 1 cisplatin and gemcitabine with day 8 gemcitabine alone.  He completed cycle 4 on August 17, 2021.   Current therapy: Pembrolizumab 200 mg every 3 weeks started on September 07, 2021.    Interim History: Barry Gibson presents today for a follow-up visit.  Since the last visit, he was hospitalized between October 26 and September 17, 7901 for complicated UTI and urosepsis.  He was treated with broad-spectrum antibiotics and was discharged with improvement in his clinical status and normalization of his kidney function.  Since his discharge, he reports feeling well without any major complaints.  He denies any nausea, vomiting or abdominal pain.  His arthralgias and myalgias have improved after receiving prednisone taper.  He is currently on 10 mg of prednisone currently.     Medications: Updated on review. Current Outpatient Medications  Medication Sig Dispense Refill   acetaminophen (TYLENOL) 650 MG CR tablet Take 1,300 mg by mouth every morning.     amLODipine (NORVASC) 10 MG tablet Take 1  tablet (10 mg total) by mouth daily. 90 tablet 1   cefUROXime (CEFTIN) 500 MG tablet Take 1 tablet (500 mg total) by mouth 2 (two) times daily with a meal. 11 tablet 0   feeding supplement (ENSURE ENLIVE / ENSURE PLUS) LIQD Take 237 mLs by mouth 2 (two) times daily between meals. 237 mL 12   lidocaine-prilocaine (EMLA) cream Apply 1 application topically as needed. (Patient not taking: Reported on 09/12/2021) 30 g 0   Multiple Vitamin (MULTIVITAMIN WITH MINERALS) TABS tablet Take 1 tablet by mouth daily.     omeprazole (PRILOSEC) 40 MG capsule Take 40 mg by mouth daily.     prednisoLONE acetate (PRED FORTE) 1 % ophthalmic suspension Place 1 drop into the left eye 2 (two) times a week. Instill 1 drop into left eye Monday and Friday.     prochlorperazine (COMPAZINE) 10 MG tablet TAKE 1 TABLET(10 MG) BY MOUTH EVERY 6 HOURS AS NEEDED FOR NAUSEA OR VOMITING 90 tablet 2   rosuvastatin (CRESTOR) 20 MG tablet Take 1 tablet (20 mg total) by mouth daily. 90 tablet 2   sertraline (ZOLOFT) 50 MG tablet TAKE 1 TABLET(50 MG) BY MOUTH DAILY 90 tablet 0   No current facility-administered medications for this visit.   Facility-Administered Medications Ordered in Other Visits  Medication Dose Route Frequency Provider Last Rate Last Admin   acetaminophen (TYLENOL) 325 MG tablet            diphenhydrAMINE (BENADRYL) 25 mg capsule            gemcitabine (GEMZAR) chemo syringe for bladder instillation 2,000 mg  2,000 mg Bladder Instillation Once Dahlstedt,  Annie Main, MD       gemcitabine [facility]) chemo syringe for bladder instillation 2,000 mg  2,000 mg Bladder Instillation Once Franchot Gallo, MD         Allergies:  Allergies  Allergen Reactions   Ace Inhibitors Cough   Diflucan [Fluconazole] Other (See Comments)    Irritated ulcers      Physical Exam:  Blood pressure 128/79, pulse 68, temperature (!) 97.1 F (36.2 C), temperature source Temporal, resp. rate 17, height 5\' 11"  (1.803 m), weight 197 lb  11.2 oz (89.7 kg), SpO2 100 %.    ECOG: 0     General appearance: Comfortable appearing without any discomfort Head: Normocephalic without any trauma Oropharynx: Mucous membranes are moist and pink without any thrush or ulcers. Eyes: Pupils are equal and round reactive to light. Lymph nodes: No cervical, supraclavicular, inguinal or axillary lymphadenopathy.   Heart:regular rate and rhythm.  S1 and S2 without leg edema. Lung: Clear without any rhonchi or wheezes.  No dullness to percussion. Abdomin: Soft, nontender, nondistended with good bowel sounds.  No hepatosplenomegaly. Musculoskeletal: No joint deformity or effusion.  Full range of motion noted. Neurological: No deficits noted on motor, sensory and deep tendon reflex exam. Skin: No petechial rash or dryness.  Appeared moist.          Lab Results: Lab Results  Component Value Date   WBC 13.5 (H) 09/20/2021   HGB 9.1 (L) 09/20/2021   HCT 28.1 (L) 09/20/2021   MCV 93 09/20/2021   PLT 452 (H) 09/20/2021     Chemistry      Component Value Date/Time   NA 138 09/20/2021 1011   K 4.7 09/20/2021 1011   CL 104 09/20/2021 1011   CO2 21 09/20/2021 1011   BUN 21 09/20/2021 1011   CREATININE 1.27 09/20/2021 1011   CREATININE 1.81 (H) 09/12/2021 1254   CREATININE 1.05 11/25/2014 0826      Component Value Date/Time   CALCIUM 8.7 09/20/2021 1011   ALKPHOS 82 09/20/2021 1011   AST 11 09/20/2021 1011   AST 13 (L) 09/12/2021 1254   ALT 16 09/20/2021 1011   ALT 7 09/12/2021 1254   BILITOT <0.2 09/20/2021 1011   BILITOT 1.1 09/12/2021 1254         Impression and Plan:  60 year old man with:  1.  Stage IV high-grade urothelial carcinoma with pelvic adenopathy diagnosed in 2022.   He has completed the first cycles of Pembrolizumab and developed a few complications that are unlikely related including urosepsis.  Risks and benefits of resuming immunotherapy were discussed at this time.  He is agreeable to proceed  with 400 mg dosing which will be repeated in 6 weeks.   2.  IV access: Port-A-Cath remains in use without any issues.  3.  Antiemetics: Compazine is available to him without any nausea or vomiting.   4.  Renal function surveillance: Creatinine clearance remains at baseline   5.  Goals of care: Aggressive measures are warranted but his disease might not be curable.  6.  Immune mediated complications: He did have slight exacerbation with rheumatoid arthritis but appears to be improved at this time.  We will continue to monitor for any other autoimmune issues including pneumonitis, colitis and thyroid disease.    7.  Follow-up: In 6 weeks for the next cycle of therapy.   30  minutes were dedicated to this visit.  The time was spent on reviewing laboratory data, disease status update and addressing complication related  to cancer and cancer therapy.   Zola Button, MD 11/11/202210:54 AM

## 2021-09-30 ENCOUNTER — Telehealth: Payer: Self-pay | Admitting: Family Medicine

## 2021-09-30 NOTE — Telephone Encounter (Signed)
Nurses-please let the know that I did communicate with his oncologist.  His oncologist said that the patient could do both pneumonia vaccine pneumococcal 20 and flu vaccine If he is interested in getting this done please go ahead with setting up nurse visit thank you

## 2021-10-01 NOTE — Telephone Encounter (Signed)
Patient notified and stated he will get them at his visit next month. Patient will call for nurse visit if he decides too et them sooner.

## 2021-10-08 ENCOUNTER — Other Ambulatory Visit: Payer: Self-pay

## 2021-10-08 ENCOUNTER — Telehealth: Payer: Self-pay | Admitting: Family Medicine

## 2021-10-08 DIAGNOSIS — J189 Pneumonia, unspecified organism: Secondary | ICD-10-CM

## 2021-10-08 NOTE — Telephone Encounter (Signed)
Patient has been informed xr orders have been ordered .

## 2021-10-08 NOTE — Telephone Encounter (Signed)
Patient is needing orders for chest x-ray at [facility] sent over ,he was seen 11/4 and suppose to have it done before his follow up in December . He is off work this week and would like to have it done.

## 2021-10-15 ENCOUNTER — Telehealth: Payer: Self-pay | Admitting: *Deleted

## 2021-10-15 ENCOUNTER — Other Ambulatory Visit: Payer: Self-pay | Admitting: Oncology

## 2021-10-15 MED ORDER — DIPHENOXYLATE-ATROPINE 2.5-0.025 MG PO TABS: 1.0000 | ORAL_TABLET | Freq: Four times a day (QID) | ORAL | 0 refills | Status: DC | PRN

## 2021-10-15 NOTE — Telephone Encounter (Signed)
PC to patient's wife Vergia Alcon, informed her rx for lomotil has been sent to pharmacy.  She also states she has a call into patient's rheumatologist regarding his joint pain.  She verbalizes understanding regarding prescription.

## 2021-10-15 NOTE — Telephone Encounter (Signed)
-----   Message from Wyatt Portela, MD sent at 10/15/2021 10:48 AM EST ----- Lomotil sent to his pharmacy. Thanks ----- Message ----- From: Rolene Course, RN Sent: 10/15/2021   8:48 AM EST To: Wyatt Portela, MD  His wife called this morning, he has recently seen his rheumatologist, has had two rounds of prednisone & injections in his knees, continues to have pain in numerous joints & is unable to lift his left arm this morning.  I advised her to call the rheumatologist.  He also has had recent weight loss despite eating well.  He lost 11 lbs in 2 weeks.  He continues to have issues with diarrhea despite taking immodium.  She is wondering if he needs a different medication for the diarrhea.  Please advise.  Thanks, Bethena Roys

## 2021-10-22 ENCOUNTER — Other Ambulatory Visit: Payer: Self-pay

## 2021-10-22 ENCOUNTER — Ambulatory Visit (HOSPITAL_COMMUNITY)
Admission: RE | Admit: 2021-10-22 | Discharge: 2021-10-22 | Disposition: A | Discharge status: Home / Self Care | Payer: BC Managed Care – PPO | Source: Ambulatory Visit | Attending: Family Medicine | Admitting: Family Medicine

## 2021-10-22 DIAGNOSIS — J189 Pneumonia, unspecified organism: Secondary | ICD-10-CM | POA: Insufficient documentation

## 2021-10-26 ENCOUNTER — Telehealth: Payer: Self-pay | Admitting: Family Medicine

## 2021-10-26 NOTE — Telephone Encounter (Signed)
Patient is needing a letter stating when he was in the hospital 09/12/21  that it wasn't pre-existing condition.Copy of letter from [employer] is in your folder for review needing by 10/30/21 faxed in if possible. Fax-[phone]

## 2021-10-28 NOTE — Telephone Encounter (Signed)
Actually it is not that simple.  The letter that was forwarded to me from the insurance company is requesting information from the hospitalization.  Essentially the insurance company will look up on those records to see if they feel that this was pre-existing or not.  I would suggest the patient have further discussion upon follow-up visit with Dr. Lacinda Axon.  I also recommend that the patient signed a release regarding those records.  More than likely he will have to sign a release with hospital for them to send admission and discharge summary unless we are allowed to do that.  Front staff please handle accordingly thank you

## 2021-11-02 ENCOUNTER — Other Ambulatory Visit: Payer: Self-pay

## 2021-11-02 ENCOUNTER — Ambulatory Visit (INDEPENDENT_AMBULATORY_CARE_PROVIDER_SITE_OTHER): Payer: BC Managed Care – PPO | Admitting: Family Medicine

## 2021-11-02 ENCOUNTER — Encounter: Payer: Self-pay | Admitting: Family Medicine

## 2021-11-02 VITALS — BP 124/72 | HR 84 | Temp 97.8°F | Wt 198.2 lb

## 2021-11-02 DIAGNOSIS — I10 Essential (primary) hypertension: Secondary | ICD-10-CM | POA: Diagnosis not present

## 2021-11-02 DIAGNOSIS — D649 Anemia, unspecified: Secondary | ICD-10-CM

## 2021-11-02 DIAGNOSIS — J189 Pneumonia, unspecified organism: Secondary | ICD-10-CM | POA: Diagnosis not present

## 2021-11-02 NOTE — Patient Instructions (Signed)
Labs today.  Continue your meds.  Follow up in 6 months or sooner if needed.

## 2021-11-03 ENCOUNTER — Encounter: Payer: Self-pay | Admitting: Oncology

## 2021-11-03 ENCOUNTER — Other Ambulatory Visit: Payer: Self-pay

## 2021-11-03 LAB — CBC
Hematocrit: 40 % (ref 37.5–51.0)
Hemoglobin: 13.1 g/dL (ref 13.0–17.7)
MCH: 30.1 pg (ref 26.6–33.0)
MCHC: 32.8 g/dL (ref 31.5–35.7)
MCV: 92 fL (ref 79–97)
Platelets: 266 10*3/uL (ref 150–450)
RBC: 4.35 x10E6/uL (ref 4.14–5.80)
RDW: 15.1 % (ref 11.6–15.4)
WBC: 8 10*3/uL (ref 3.4–10.8)

## 2021-11-03 LAB — VITAMIN B12: Vitamin B-12: 398 pg/mL (ref 232–1245)

## 2021-11-03 LAB — IRON,TIBC AND FERRITIN PANEL
Ferritin: 536 ng/mL — ABNORMAL HIGH (ref 30–400)
Iron Saturation: 19 % (ref 15–55)
Iron: 56 ug/dL (ref 38–169)
Total Iron Binding Capacity: 290 ug/dL (ref 250–450)
UIBC: 234 ug/dL (ref 111–343)

## 2021-11-03 LAB — BASIC METABOLIC PANEL
BUN/Creatinine Ratio: 16 (ref 9–20)
BUN: 23 mg/dL (ref 6–24)
CO2: 22 mmol/L (ref 20–29)
Calcium: 9.3 mg/dL (ref 8.7–10.2)
Chloride: 104 mmol/L (ref 96–106)
Creatinine, Ser: 1.48 mg/dL — ABNORMAL HIGH (ref 0.76–1.27)
Glucose: 78 mg/dL (ref 70–99)
Potassium: 4.3 mmol/L (ref 3.5–5.2)
Sodium: 141 mmol/L (ref 134–144)
eGFR: 54 mL/min/{1.73_m2} — ABNORMAL LOW (ref >59)

## 2021-11-03 LAB — FOLATE: Folate: 14.7 ng/mL (ref >3.0)

## 2021-11-04 DIAGNOSIS — D649 Anemia, unspecified: Secondary | ICD-10-CM | POA: Insufficient documentation

## 2021-11-04 NOTE — Progress Notes (Signed)
Subjective:  Patient ID: Barry Gibson, male    DOB: 03/23/1961  Age: 60 y.o. MRN: [id number]  CC: Chief Complaint  Patient presents with   Follow-up    Pt had pneumonia recently and here for follow up.     HPI:  60 year old male with bladder cancer, recent CAP, HTN, HLD, RA presents for follow up.  Recent hospital admission. CAP now resolved. He is doing well at this time.  Undergoing immunotherapy for bladder cancer.  Hypertension is well controlled on Amlodipine.  Review of the EMR revealed recent anemia (Hb 9.9). No recent lab work up regarding this. Will discuss with patient today.    Patient Active Problem List   Diagnosis Date Noted   Anemia 11/04/2021   CAP (community acquired pneumonia) 09/17/2021   Port-A-Cath in place 07/27/2021   History of peptic ulcer 09/24/2020   Bladder cancer ([place]) 03/06/2020   Rheumatoid arthritis ([place]) 04/07/2018   Essential hypertension, benign 02/05/2013   Hyperlipidemia 02/04/2013    Social Hx   Social History   Socioeconomic History   Marital status: Married    Spouse name: Not on file   Number of children: Not on file   Years of education: Not on file   Highest education level: Not on file  Occupational History   Not on file  Tobacco Use   Smoking status: Former    Packs/day: 0.50    Years: 20.00    Pack years: 10.00    Types: Cigarettes   Smokeless tobacco: Never   Tobacco comments:    quit 2010  Vaping Use   Vaping Use: Never used  Substance and Sexual Activity   Alcohol use: Yes    Comment: occ  2 x month   Drug use: No   Sexual activity: Yes  Other Topics Concern   Not on file  Social History Narrative   Not on file   Social Determinants of Health   Financial Resource Strain: Not on file  Food Insecurity: Not on file  Transportation Needs: Not on file  Physical Activity: Not on file  Stress: Not on file  Social Connections: Not on file    Review of Systems Per HPI  Objective:  BP 124/72     Pulse 84    Temp 97.8 F (36.6 C)    Wt 198 lb 3.2 oz (89.9 kg)    SpO2 99%    BMI 27.64 kg/m   BP/Weight 11/02/2021 09/28/2021 57/0/1779  Systolic BP 390 300 923  Diastolic BP 72 79 74  Wt. (Lbs) 198.2 197.7 200  BMI 27.64 27.57 27.89    Physical Exam Vitals and nursing note reviewed.  Constitutional:      General: He is not in acute distress.    Appearance: Normal appearance. He is not ill-appearing.  HENT:     Head: Normocephalic and atraumatic.  Cardiovascular:     Rate and Rhythm: Normal rate and regular rhythm.  Pulmonary:     Effort: Pulmonary effort is normal.     Breath sounds: Normal breath sounds. No wheezing or rales.  Neurological:     Mental Status: He is alert.  Psychiatric:        Mood and Affect: Mood normal.        Behavior: Behavior normal.    Lab Results  Component Value Date   WBC 8.0 11/02/2021   HGB 13.1 11/02/2021   HCT 40.0 11/02/2021   PLT 266 11/02/2021   GLUCOSE 78 11/02/2021  CHOL 154 11/03/2019   TRIG 60 11/03/2019   HDL 36 (L) 11/03/2019   LDLCALC 106 (H) 11/03/2019   ALT 16 09/28/2021   AST 14 (L) 09/28/2021   NA 141 11/02/2021   K 4.3 11/02/2021   CL 104 11/02/2021   CREATININE 1.48 (H) 11/02/2021   BUN 23 11/02/2021   CO2 22 11/02/2021   TSH 0.850 09/28/2021   PSA 0.53 11/25/2014   INR 1.1 11/19/2020     Assessment & Plan:   Problem List Items Addressed This Visit       Cardiovascular and Mediastinum   Essential hypertension, benign - Primary    Stable. Continue Amlodipine.      Relevant Orders   Basic metabolic panel (Completed)     Respiratory   CAP (community acquired pneumonia)    Now resolved. Xray reviewed today.        Other   Anemia    Labs obtained for further evaluation.       Relevant Orders   CBC (Completed)   Iron, TIBC and Ferritin Panel (Completed)   Vitamin B12 (Completed)   Folate (Completed)    Follow-up:  Return in about 6 months (around 05/03/2022).  [place]

## 2021-11-04 NOTE — Assessment & Plan Note (Signed)
Now resolved. Xray reviewed today.

## 2021-11-04 NOTE — Assessment & Plan Note (Signed)
Stable. Continue Amlodipine. 

## 2021-11-04 NOTE — Assessment & Plan Note (Signed)
Labs obtained for further evaluation.

## 2021-11-09 ENCOUNTER — Other Ambulatory Visit: Payer: Self-pay

## 2021-11-09 ENCOUNTER — Inpatient Hospital Stay: Payer: BC Managed Care – PPO | Attending: Oncology

## 2021-11-09 ENCOUNTER — Inpatient Hospital Stay (HOSPITAL_BASED_OUTPATIENT_CLINIC_OR_DEPARTMENT_OTHER): Payer: BC Managed Care – PPO | Admitting: Oncology

## 2021-11-09 ENCOUNTER — Inpatient Hospital Stay: Payer: BC Managed Care – PPO

## 2021-11-09 VITALS — BP 112/78 | HR 88 | Temp 97.6°F | Resp 18 | Wt 203.1 lb

## 2021-11-09 DIAGNOSIS — C778 Secondary and unspecified malignant neoplasm of lymph nodes of multiple regions: Secondary | ICD-10-CM | POA: Diagnosis not present

## 2021-11-09 DIAGNOSIS — Z5112 Encounter for antineoplastic immunotherapy: Secondary | ICD-10-CM | POA: Insufficient documentation

## 2021-11-09 DIAGNOSIS — C670 Malignant neoplasm of trigone of bladder: Secondary | ICD-10-CM | POA: Insufficient documentation

## 2021-11-09 DIAGNOSIS — Z79899 Other long term (current) drug therapy: Secondary | ICD-10-CM | POA: Insufficient documentation

## 2021-11-09 DIAGNOSIS — Z95828 Presence of other vascular implants and grafts: Secondary | ICD-10-CM

## 2021-11-09 DIAGNOSIS — Z883 Allergy status to other anti-infective agents status: Secondary | ICD-10-CM | POA: Diagnosis not present

## 2021-11-09 DIAGNOSIS — C679 Malignant neoplasm of bladder, unspecified: Secondary | ICD-10-CM

## 2021-11-09 LAB — TSH: TSH: 1.152 u[IU]/mL (ref 0.320–4.118)

## 2021-11-09 LAB — CBC WITH DIFFERENTIAL (CANCER CENTER ONLY)
Abs Immature Granulocytes: 0.03 10*3/uL (ref 0.00–0.07)
Basophils Absolute: 0.1 10*3/uL (ref 0.0–0.1)
Basophils Relative: 1 %
Eosinophils Absolute: 0.2 10*3/uL (ref 0.0–0.5)
Eosinophils Relative: 2 %
HCT: 39.7 % (ref 39.0–52.0)
Hemoglobin: 12.6 g/dL — ABNORMAL LOW (ref 13.0–17.0)
Immature Granulocytes: 0 %
Lymphocytes Relative: 22 %
Lymphs Abs: 1.7 10*3/uL (ref 0.7–4.0)
MCH: 29.9 pg (ref 26.0–34.0)
MCHC: 31.7 g/dL (ref 30.0–36.0)
MCV: 94.1 fL (ref 80.0–100.0)
Monocytes Absolute: 0.5 10*3/uL (ref 0.1–1.0)
Monocytes Relative: 7 %
Neutro Abs: 5.3 10*3/uL (ref 1.7–7.7)
Neutrophils Relative %: 68 %
Platelet Count: 217 10*3/uL (ref 150–400)
RBC: 4.22 MIL/uL (ref 4.22–5.81)
RDW: 15 % (ref 11.5–15.5)
WBC Count: 7.8 10*3/uL (ref 4.0–10.5)
nRBC: 0 % (ref 0.0–0.2)

## 2021-11-09 LAB — CMP (CANCER CENTER ONLY)
ALT: 16 U/L (ref 0–44)
AST: 15 U/L (ref 15–41)
Albumin: 3.8 g/dL (ref 3.5–5.0)
Alkaline Phosphatase: 90 U/L (ref 38–126)
Anion gap: 8 (ref 5–15)
BUN: 21 mg/dL — ABNORMAL HIGH (ref 6–20)
CO2: 25 mmol/L (ref 22–32)
Calcium: 9.2 mg/dL (ref 8.9–10.3)
Chloride: 108 mmol/L (ref 98–111)
Creatinine: 1.39 mg/dL — ABNORMAL HIGH (ref 0.61–1.24)
GFR, Estimated: 58 mL/min — ABNORMAL LOW (ref >60)
Glucose, Bld: 102 mg/dL — ABNORMAL HIGH (ref 70–99)
Potassium: 3.8 mmol/L (ref 3.5–5.1)
Sodium: 141 mmol/L (ref 135–145)
Total Bilirubin: 0.2 mg/dL — ABNORMAL LOW (ref 0.3–1.2)
Total Protein: 7 g/dL (ref 6.5–8.1)

## 2021-11-09 MED ORDER — SODIUM CHLORIDE 0.9% FLUSH
10.0000 mL | Freq: Once | INTRAVENOUS | Status: AC
Start: 2021-11-09 — End: 2021-11-09
  Administered 2021-11-09: 08:00:00 10 mL

## 2021-11-09 MED ORDER — HEPARIN SOD (PORK) LOCK FLUSH 100 UNIT/ML IV SOLN
500.0000 [IU] | Freq: Once | INTRAVENOUS | Status: AC | PRN
  Administered 2021-11-09: 10:00:00 500 [IU]

## 2021-11-09 MED ORDER — SODIUM CHLORIDE 0.9 % IV SOLN
400.0000 mg | Freq: Once | INTRAVENOUS | Status: AC
  Administered 2021-11-09: 10:00:00 400 mg via INTRAVENOUS
  Filled 2021-11-09: qty 16

## 2021-11-09 MED ORDER — SODIUM CHLORIDE 0.9 % IV SOLN: Freq: Once | INTRAVENOUS | Status: AC

## 2021-11-09 MED ORDER — SODIUM CHLORIDE 0.9% FLUSH
10.0000 mL | INTRAVENOUS | Status: DC | PRN
  Administered 2021-11-09: 10:00:00 10 mL

## 2021-11-09 NOTE — Progress Notes (Signed)
Hematology and Oncology Follow Up Visit  Barry Gibson [id number] 1961/10/24 61 y.o. 11/09/2021 8:14 AM Barry Gibson, DOTaylor, Barry M, DO   Principle Diagnosis: 60 year old man with bladder cancer diagnosed in 2022.  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   Prior Therapy:   He underwent robotic assisted laparoscopic radical cystectomy and lymphadenectomy January 2022.  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  He had 4 out of 7 lymph nodes involved with the final pathological staging is T4N2 disease.     He underwent staging scans on April 19, 2021 which showed interval progression of abdominal pelvic adenopathy consistent with worsening nodal metastasis.    Chemotherapy utilizing gemcitabine and cisplatin started on June 08, 2021 receiving day 1 cisplatin and gemcitabine with day 8 gemcitabine alone.  He completed cycle 4 on August 17, 2021.   Current therapy: Pembrolizumab 200 mg every 3 weeks started on September 07, 2021.  He is currently receiving 400 mg every 6 weeks and presents for the next cycle of therapy.    Interim History: Barry Gibson returns today for a follow-up evaluation.  Since the last visit, he reports no major changes in his health.  He has tolerated the last cycle of Pembrolizumab without any major complaints.  He denies any nausea, vomiting or abdominal pain.  He denies any recent hospitalizations or illnesses.  He denies any worsening of neuropathy or arthralgias.  No exacerbation of his rheumatoid arthritis noted.     Medications: Reviewed and updated. Current Outpatient Medications  Medication Sig Dispense Refill   acetaminophen (TYLENOL) 650 MG CR tablet Take 1,300 mg by mouth every morning.     amLODipine (NORVASC) 10 MG tablet Take 1 tablet (10 mg total) by mouth  daily. 90 tablet 1   diphenoxylate-atropine (LOMOTIL) 2.5-0.025 MG tablet Take 1 tablet by mouth 4 (four) times daily as needed for diarrhea or loose stools. 30 tablet 0   feeding supplement (ENSURE ENLIVE / ENSURE PLUS) LIQD Take 237 mLs by mouth 2 (two) times daily between meals. 237 mL 12   lidocaine-prilocaine (EMLA) cream Apply 1 application topically as needed. 30 g 0   Multiple Vitamin (MULTIVITAMIN WITH MINERALS) TABS tablet Take 1 tablet by mouth daily.     omeprazole (PRILOSEC) 40 MG capsule Take 40 mg by mouth daily.     prednisoLONE acetate (PRED FORTE) 1 % ophthalmic suspension Place 1 drop into the left eye 2 (two) times a week. Instill 1 drop into left eye Monday and Friday.     prochlorperazine (COMPAZINE) 10 MG tablet TAKE 1 TABLET(10 MG) BY MOUTH EVERY 6 HOURS AS NEEDED FOR NAUSEA OR VOMITING 90 tablet 2   rosuvastatin (CRESTOR) 20 MG tablet Take 1 tablet (20 mg total) by mouth daily. 90 tablet 2   sertraline (ZOLOFT) 50 MG tablet TAKE 1 TABLET(50 MG) BY MOUTH DAILY 90 tablet 0   No current facility-administered medications for this visit.   Facility-Administered Medications Ordered in Other Visits  Medication Dose Route Frequency Provider Last Rate Last Admin   acetaminophen (TYLENOL) 325 MG tablet            diphenhydrAMINE (BENADRYL) 25 mg capsule            gemcitabine (GEMZAR) chemo syringe for bladder instillation 2,000 mg  2,000 mg Bladder Instillation Once Franchot Gallo, MD  gemcitabine (GEMZAR) chemo syringe for bladder instillation 2,000 mg  2,000 mg Bladder Instillation Once Franchot Gallo, MD       sodium chloride flush (NS) 0.9 % injection 10 mL  10 mL Intracatheter Once Wyatt Portela, MD         Allergies:  Allergies  Allergen Reactions   Ace Inhibitors Cough   Diflucan [Fluconazole] Other (See Comments)    Irritated ulcers      Physical Exam:   Blood pressure 112/78, pulse 88, temperature 97.6 F (36.4 C), temperature source Oral,  resp. rate 18, weight 203 lb 1 oz (92.1 kg), SpO2 100 %.    ECOG: 0   General appearance: Alert, awake without any distress. Head: Atraumatic without abnormalities Oropharynx: Without any thrush or ulcers. Eyes: No scleral icterus. Lymph nodes: No lymphadenopathy noted in the cervical, supraclavicular, or axillary nodes Heart:regular rate and rhythm, without any murmurs or gallops.   Lung: Clear to auscultation without any rhonchi, wheezes or dullness to percussion. Abdomin: Soft, nontender without any shifting dullness or ascites. Musculoskeletal: No clubbing or cyanosis. Neurological: No motor or sensory deficits. Skin: No rashes or lesions.         Lab Results: Lab Results  Component Value Date   WBC 8.0 11/02/2021   HGB 13.1 11/02/2021   HCT 40.0 11/02/2021   MCV 92 11/02/2021   PLT 266 11/02/2021     Chemistry      Component Value Date/Time   NA 141 11/02/2021 1512   K 4.3 11/02/2021 1512   CL 104 11/02/2021 1512   CO2 22 11/02/2021 1512   BUN 23 11/02/2021 1512   CREATININE 1.48 (H) 11/02/2021 1512   CREATININE 1.20 09/28/2021 1037   CREATININE 1.05 11/25/2014 0826      Component Value Date/Time   CALCIUM 9.3 11/02/2021 1512   ALKPHOS 76 09/28/2021 1037   AST 14 (L) 09/28/2021 1037   ALT 16 09/28/2021 1037   BILITOT 0.2 (L) 09/28/2021 1037         Impression and Plan:  61 year old man with:  1.  Bladder cancer diagnosed in 2022.  He presented with stage IV high-grade urothelial carcinoma with pelvic adenopathy.    He is currently on Pembrolizumab which she has tolerated reasonably well without any major complaints.  Risks and benefits of continuing this treatment were reviewed at this time.  Potential complications that include nausea, fatigue and autoimmune complications were reiterated.  The plan is to update his staging scans for the next cycle.  He is agreeable to proceed with this plan.   2.  IV access: Port-A-Cath remains in use without  any issues.  3.  Antiemetics: No nausea or vomiting reported at this time.  Compazine is available to him.   4.  Renal function surveillance: His creatinine clearance is around 60 cc/min we will continue to monitor.   5.  Goals of care: His disease might not be curable but aggressive measures are warranted given his reasonable performance status.  6.  Immune mediated complications: We continue to monitor for exacerbation including worsening arthritis, pneumonitis, hepatitis among others.  No recent exacerbation of rheumatoid arthritis.    7.  Follow-up: He will return in 6 weeks for the next cycle.   30  minutes were spent on this encounter.  The time was dedicated to reviewing laboratory data, disease status update and outlining future plan care.   Zola Button, MD 12/23/20228:14 AM

## 2021-11-09 NOTE — Patient Instructions (Signed)
[facility]  Discharge Instructions: ?Thank you for choosing [facility] to provide your oncology and hematology care.  ? ?If you have a lab appointment with the Cancer Center, please go directly to the Cancer Center and check in at the registration area. ?  ?Wear comfortable clothing and clothing appropriate for easy access to any Portacath or PICC line.  ? ?We strive to give you quality time with your provider. You may need to reschedule your appointment if you arrive late (15 or more minutes).  Arriving late affects you and other patients whose appointments are after yours.  Also, if you miss three or more appointments without notifying the office, you may be dismissed from the clinic at the provider?s discretion.    ?  ?For prescription refill requests, have your pharmacy contact our office and allow 72 hours for refills to be completed.   ? ?Today you received the following chemotherapy and/or immunotherapy agents: Keytruda ?  ?To help prevent nausea and vomiting after your treatment, we encourage you to take your nausea medication as directed. ? ?BELOW ARE SYMPTOMS THAT SHOULD BE REPORTED IMMEDIATELY: ?*FEVER GREATER THAN 100.4 F (38 ?C) OR HIGHER ?*CHILLS OR SWEATING ?*NAUSEA AND VOMITING THAT IS NOT CONTROLLED WITH YOUR NAUSEA MEDICATION ?*UNUSUAL SHORTNESS OF BREATH ?*UNUSUAL BRUISING OR BLEEDING ?*URINARY PROBLEMS (pain or burning when urinating, or frequent urination) ?*BOWEL PROBLEMS (unusual diarrhea, constipation, pain near the anus) ?TENDERNESS IN MOUTH AND THROAT WITH OR WITHOUT PRESENCE OF ULCERS (sore throat, sores in mouth, or a toothache) ?UNUSUAL RASH, SWELLING OR PAIN  ?UNUSUAL VAGINAL DISCHARGE OR ITCHING  ? ?Items with * indicate a potential emergency and should be followed up as soon as possible or go to the Emergency Department if any problems should occur. ? ?Please show the CHEMOTHERAPY ALERT CARD or IMMUNOTHERAPY ALERT CARD at check-in to the  Emergency Department and triage nurse. ? ?Should you have questions after your visit or need to cancel or reschedule your appointment, please contact [facility]  Dept: [phone]  and follow the prompts.  Office hours are 8:00 a.m. to 4:30 p.m. Monday - Friday. Please note that voicemails left after 4:00 p.m. may not be returned until the following business day.  We are closed weekends and major holidays. You have access to a nurse at all times for urgent questions. Please call the main number to the clinic Dept: [phone] and follow the prompts. ? ? ?For any non-urgent questions, you may also contact your provider using MyChart. We now offer e-Visits for anyone 18 and older to request care online for non-urgent symptoms. For details visit [email or web address]. ?  ?Also download the MyChart app! Go to the app store, search "MyChart", open the app, select , and log in with your MyChart username and password. ? ?Due to Covid, a mask is required upon entering the hospital/clinic. If you do not have a mask, one will be given to you upon arrival. For doctor visits, patients may have 1 support person aged 18 or older with them. For treatment visits, patients cannot have anyone with them due to current Covid guidelines and our immunocompromised population.  ? ?

## 2021-11-16 ENCOUNTER — Inpatient Hospital Stay (HOSPITAL_COMMUNITY)
Admission: EM | Admit: 2021-11-16 | Discharge: 2021-11-17 | DRG: 699 | Disposition: A | Discharge status: Home / Self Care | Payer: BC Managed Care – PPO | Attending: Internal Medicine | Admitting: Internal Medicine

## 2021-11-16 ENCOUNTER — Other Ambulatory Visit: Payer: Self-pay

## 2021-11-16 ENCOUNTER — Encounter (HOSPITAL_COMMUNITY): Payer: Self-pay

## 2021-11-16 ENCOUNTER — Emergency Department (HOSPITAL_COMMUNITY): Payer: BC Managed Care – PPO

## 2021-11-16 DIAGNOSIS — I10 Essential (primary) hypertension: Secondary | ICD-10-CM | POA: Diagnosis present

## 2021-11-16 DIAGNOSIS — M069 Rheumatoid arthritis, unspecified: Secondary | ICD-10-CM | POA: Diagnosis present

## 2021-11-16 DIAGNOSIS — N99521 Infection of other external stoma of urinary tract: Secondary | ICD-10-CM | POA: Diagnosis not present

## 2021-11-16 DIAGNOSIS — E876 Hypokalemia: Secondary | ICD-10-CM | POA: Diagnosis present

## 2021-11-16 DIAGNOSIS — Y732 Prosthetic and other implants, materials and accessory gastroenterology and urology devices associated with adverse incidents: Secondary | ICD-10-CM | POA: Diagnosis present

## 2021-11-16 DIAGNOSIS — C679 Malignant neoplasm of bladder, unspecified: Secondary | ICD-10-CM | POA: Diagnosis present

## 2021-11-16 DIAGNOSIS — A419 Sepsis, unspecified organism: Secondary | ICD-10-CM

## 2021-11-16 DIAGNOSIS — Z906 Acquired absence of other parts of urinary tract: Secondary | ICD-10-CM

## 2021-11-16 DIAGNOSIS — Z8616 Personal history of COVID-19: Secondary | ICD-10-CM

## 2021-11-16 DIAGNOSIS — E785 Hyperlipidemia, unspecified: Secondary | ICD-10-CM | POA: Diagnosis present

## 2021-11-16 DIAGNOSIS — N1831 Chronic kidney disease, stage 3a: Secondary | ICD-10-CM | POA: Diagnosis present

## 2021-11-16 DIAGNOSIS — N390 Urinary tract infection, site not specified: Secondary | ICD-10-CM | POA: Diagnosis present

## 2021-11-16 DIAGNOSIS — I129 Hypertensive chronic kidney disease with stage 1 through stage 4 chronic kidney disease, or unspecified chronic kidney disease: Secondary | ICD-10-CM | POA: Diagnosis present

## 2021-11-16 DIAGNOSIS — Z87891 Personal history of nicotine dependence: Secondary | ICD-10-CM

## 2021-11-16 DIAGNOSIS — Z947 Corneal transplant status: Secondary | ICD-10-CM

## 2021-11-16 DIAGNOSIS — Z20822 Contact with and (suspected) exposure to covid-19: Secondary | ICD-10-CM | POA: Diagnosis present

## 2021-11-16 DIAGNOSIS — K219 Gastro-esophageal reflux disease without esophagitis: Secondary | ICD-10-CM | POA: Diagnosis present

## 2021-11-16 DIAGNOSIS — F32A Depression, unspecified: Secondary | ICD-10-CM | POA: Diagnosis present

## 2021-11-16 DIAGNOSIS — Z888 Allergy status to other drugs, medicaments and biological substances status: Secondary | ICD-10-CM

## 2021-11-16 DIAGNOSIS — D72829 Elevated white blood cell count, unspecified: Secondary | ICD-10-CM

## 2021-11-16 LAB — CBC WITH DIFFERENTIAL/PLATELET
Abs Immature Granulocytes: 0.14 10*3/uL — ABNORMAL HIGH (ref 0.00–0.07)
Basophils Absolute: 0.1 10*3/uL (ref 0.0–0.1)
Basophils Relative: 0 %
Eosinophils Absolute: 0 10*3/uL (ref 0.0–0.5)
Eosinophils Relative: 0 %
HCT: 42.7 % (ref 39.0–52.0)
Hemoglobin: 13.9 g/dL (ref 13.0–17.0)
Immature Granulocytes: 1 %
Lymphocytes Relative: 8 %
Lymphs Abs: 1.4 10*3/uL (ref 0.7–4.0)
MCH: 30.2 pg (ref 26.0–34.0)
MCHC: 32.6 g/dL (ref 30.0–36.0)
MCV: 92.6 fL (ref 80.0–100.0)
Monocytes Absolute: 1.2 10*3/uL — ABNORMAL HIGH (ref 0.1–1.0)
Monocytes Relative: 7 %
Neutro Abs: 14.1 10*3/uL — ABNORMAL HIGH (ref 1.7–7.7)
Neutrophils Relative %: 84 %
Platelets: 203 10*3/uL (ref 150–400)
RBC: 4.61 MIL/uL (ref 4.22–5.81)
RDW: 15.2 % (ref 11.5–15.5)
WBC: 16.9 10*3/uL — ABNORMAL HIGH (ref 4.0–10.5)
nRBC: 0 % (ref 0.0–0.2)

## 2021-11-16 LAB — COMPREHENSIVE METABOLIC PANEL
ALT: 17 U/L (ref 0–44)
AST: 15 U/L (ref 15–41)
Albumin: 3.6 g/dL (ref 3.5–5.0)
Alkaline Phosphatase: 66 U/L (ref 38–126)
Anion gap: 9 (ref 5–15)
BUN: 26 mg/dL — ABNORMAL HIGH (ref 6–20)
CO2: 21 mmol/L — ABNORMAL LOW (ref 22–32)
Calcium: 8.9 mg/dL (ref 8.9–10.3)
Chloride: 103 mmol/L (ref 98–111)
Creatinine, Ser: 1.45 mg/dL — ABNORMAL HIGH (ref 0.61–1.24)
GFR, Estimated: 56 mL/min — ABNORMAL LOW (ref >60)
Glucose, Bld: 127 mg/dL — ABNORMAL HIGH (ref 70–99)
Potassium: 3.3 mmol/L — ABNORMAL LOW (ref 3.5–5.1)
Sodium: 133 mmol/L — ABNORMAL LOW (ref 135–145)
Total Bilirubin: 0.7 mg/dL (ref 0.3–1.2)
Total Protein: 7.2 g/dL (ref 6.5–8.1)

## 2021-11-16 LAB — PROTIME-INR
INR: 1.1 (ref 0.8–1.2)
Prothrombin Time: 14.5 seconds (ref 11.4–15.2)

## 2021-11-16 LAB — APTT: aPTT: 32 seconds (ref 24–36)

## 2021-11-16 LAB — LACTIC ACID, PLASMA: Lactic Acid, Venous: 1.4 mmol/L (ref 0.5–1.9)

## 2021-11-16 MED ORDER — SODIUM CHLORIDE 0.9 % IV BOLUS (SEPSIS)
1000.0000 mL | Freq: Once | INTRAVENOUS | Status: AC
  Administered 2021-11-16: 23:00:00 1000 mL via INTRAVENOUS

## 2021-11-16 MED ORDER — SODIUM CHLORIDE 0.9 % IV SOLN
2.0000 g | INTRAVENOUS | Status: DC
  Administered 2021-11-16: 23:00:00 2 g via INTRAVENOUS
  Filled 2021-11-16: qty 20

## 2021-11-16 NOTE — ED Provider Notes (Addendum)
[facility] EMERGENCY DEPARTMENT Provider Note   CSN: [id number] Arrival date & time: 11/16/21  2117     History Chief Complaint  Patient presents with   Emesis    Barry Gibson is a 60 y.o. male.   Emesis  This patient is a 60 year old male, he has a known history of bladder cancer status post cystectomy, he has a diverting urostomy in his right lower abdomen and unfortunately has a history of urosepsis which left him in the intensive care unit in the past.  He presents to the hospital after developing some mild symptoms yesterday and then worsening today with generalized weakness, bit of a headache and a fever as high as 102 at home.  The patient reports not having any diarrhea, no rashes, no coughing or shortness of breath, no sore throat or runny nose, no sick contacts.  He is currently getting Keytruda through a port for ongoing therapy.  His symptoms have been persistent throughout the day, nothing seems to make it better or worse  Past Medical History:  Diagnosis Date   Cancer ([place])    bladder   Cataract    COVID-19 11/13/2019   h/a, chills, fatigue, loss of taste and smell, all symptoms resolved in a few weeks   Frequent urination    Fuchs' corneal dystrophy    Full dentures    GERD (gastroesophageal reflux disease)    Hypertension    Rheumatoid arthritis ([place])     Patient Active Problem List   Diagnosis Date Noted   Anemia 11/04/2021   CAP (community acquired pneumonia) 09/17/2021   Port-A-Cath in place 07/27/2021   History of peptic ulcer 09/24/2020   Bladder cancer ([place]) 03/06/2020   Rheumatoid arthritis ([place]) 04/07/2018   Essential hypertension, benign 02/05/2013   Hyperlipidemia 02/04/2013    Past Surgical History:  Procedure Laterality Date   CORNEAL TRANSPLANT Left 2013   CYSTOSCOPY WITH BIOPSY N/A 09/14/2019   Procedure: CYSTOSCOPY WITH BIOPSY;  Surgeon: Franchot Gallo, MD;  Location: [facility];  Service: Urology;  Laterality: N/A;    CYSTOSCOPY WITH INJECTION N/A 11/29/2020   Procedure: CYSTOSCOPY WITH INJECTION OF INDOCYANINE GREEN DYE;  Surgeon: Alexis Frock, MD;  Location: [facility];  Service: Urology;  Laterality: N/A;  6 HRS   FULGURATION OF BLADDER TUMOR N/A 09/14/2019   Procedure: FULGURATION OF BLADDER TUMOR;  Surgeon: Franchot Gallo, MD;  Location: [facility];  Service: Urology;  Laterality: N/A;   IR IMAGING GUIDED PORT INSERTION  06/07/2021   IR NEPHROSTOMY EXCHANGE LEFT  10/30/2020   IR NEPHROSTOMY EXCHANGE LEFT  11/19/2020   IR NEPHROSTOMY EXCHANGE RIGHT  10/30/2020   IR NEPHROSTOMY EXCHANGE RIGHT  11/19/2020   IR NEPHROSTOMY PLACEMENT LEFT  09/27/2020   IR NEPHROSTOMY PLACEMENT RIGHT  09/27/2020   ROBOT ASSISTED LAPAROSCOPIC COMPLETE CYSTECT ILEAL CONDUIT N/A 11/29/2020   Procedure: XI ROBOTIC ASSISTED LAPAROSCOPIC COMPLETE CYSTECT ILEAL CONDUIT/RADICAL PROSTATECTOMY/LYMPHADENECTOMY;  Surgeon: Alexis Frock, MD;  Location: [facility];  Service: Urology;  Laterality: N/A;   TRANSURETHRAL RESECTION OF BLADDER TUMOR WITH MITOMYCIN-C N/A 08/05/2019   Procedure: TRANSURETHRAL RESECTION OF BLADDER TUMOR;  Surgeon: Franchot Gallo, MD;  Location: [facility];  Service: Urology;  Laterality: N/A;  1 HR   TRANSURETHRAL RESECTION OF BLADDER TUMOR WITH MITOMYCIN-C N/A 04/24/2020   Procedure: TRANSURETHRAL RESECTION OF BLADDER TUMOR WITH GEMCIDABINE;  Surgeon: Franchot Gallo, MD;  Location: [facility];  Service: Urology;  Laterality: N/A;       No family history on  file.  Social History   Tobacco Use   Smoking status: Former    Packs/day: 0.50    Years: 20.00    Pack years: 10.00    Types: Cigarettes   Smokeless tobacco: Never   Tobacco comments:    quit 2010  Vaping Use   Vaping Use: Never used  Substance Use Topics   Alcohol use: Yes    Comment: occ  2 x month   Drug use: No    Home Medications Prior to Admission medications   Medication Sig Start Date End Date Taking?  Authorizing Provider  acetaminophen (TYLENOL) 650 MG CR tablet Take 1,300 mg by mouth every morning.    [provider]  amLODipine (NORVASC) 10 MG tablet Take 1 tablet (10 mg total) by mouth daily. 05/07/21   Lovena Le, Malena M, DO  diphenoxylate-atropine (LOMOTIL) 2.5-0.025 MG tablet Take 1 tablet by mouth 4 (four) times daily as needed for diarrhea or loose stools. 10/15/21   Wyatt Portela, MD  feeding supplement (ENSURE ENLIVE / ENSURE PLUS) LIQD Take 237 mLs by mouth 2 (two) times daily between meals. 09/18/21   Debbe Odea, MD  lidocaine-prilocaine (EMLA) cream Apply 1 application topically as needed. 05/18/21   Wyatt Portela, MD  Multiple Vitamin (MULTIVITAMIN WITH MINERALS) TABS tablet Take 1 tablet by mouth daily. 09/18/21   Debbe Odea, MD  omeprazole (PRILOSEC) 40 MG capsule Take 40 mg by mouth daily.    [provider]  prednisoLONE acetate (PRED FORTE) 1 % ophthalmic suspension Place 1 drop into the left eye 2 (two) times a week. Instill 1 drop into left eye Monday and Friday.    [provider]  prochlorperazine (COMPAZINE) 10 MG tablet TAKE 1 TABLET(10 MG) BY MOUTH EVERY 6 HOURS AS NEEDED FOR NAUSEA OR VOMITING 07/24/21   Wyatt Portela, MD  rosuvastatin (CRESTOR) 20 MG tablet Take 1 tablet (20 mg total) by mouth daily. 09/21/21   Kathyrn Drown, MD  sertraline (ZOLOFT) 50 MG tablet TAKE 1 TABLET(50 MG) BY MOUTH DAILY 08/06/21   Elvia Collum M, DO    Allergies    Ace inhibitors and Diflucan [fluconazole]  Review of Systems   Review of Systems  Gastrointestinal:  Positive for vomiting.  All other systems reviewed and are negative.  Physical Exam Updated Vital Signs BP 125/78 (BP Location: Right Arm)    Pulse 96    Temp 99.2 F (37.3 C) (Oral)    Resp 17    Ht 1.803 m (5\' 11" )    Wt 92.1 kg    SpO2 94%    BMI 28.32 kg/m   Physical Exam Vitals and nursing note reviewed.  Constitutional:      General: He is not in acute distress.    Appearance:  He is well-developed.  HENT:     Head: Normocephalic and atraumatic.     Mouth/Throat:     Pharynx: No oropharyngeal exudate.  Eyes:     General: No scleral icterus.       Right eye: No discharge.        Left eye: No discharge.     Conjunctiva/sclera: Conjunctivae normal.     Pupils: Pupils are equal, round, and reactive to light.  Neck:     Thyroid: No thyromegaly.     Vascular: No JVD.  Cardiovascular:     Rate and Rhythm: Normal rate and regular rhythm.     Heart sounds: Normal heart sounds. No murmur heard.   No  friction rub. No gallop.  Pulmonary:     Effort: Pulmonary effort is normal. No respiratory distress.     Breath sounds: Normal breath sounds. No wheezing or rales.  Abdominal:     General: Bowel sounds are normal. There is no distension.     Palpations: Abdomen is soft. There is no mass.     Tenderness: There is no abdominal tenderness.     Comments: Urostomy bag has yellow urine, no obvious significant sediment or cloudiness, the abdomen is otherwise completely benign and there is no CVA tenderness  Musculoskeletal:        General: No tenderness. Normal range of motion.     Cervical back: Normal range of motion and neck supple.  Lymphadenopathy:     Cervical: No cervical adenopathy.  Skin:    General: Skin is warm and dry.     Findings: No erythema or rash.  Neurological:     Mental Status: He is alert.     Coordination: Coordination normal.  Psychiatric:        Behavior: Behavior normal.    ED Results / Procedures / Treatments   Labs (all labs ordered are listed, but only abnormal results are displayed) Labs Reviewed  COMPREHENSIVE METABOLIC PANEL - Abnormal; Notable for the following components:      Result Value   Sodium 133 (*)    Potassium 3.3 (*)    CO2 21 (*)    Glucose, Bld 127 (*)    BUN 26 (*)    Creatinine, Ser 1.45 (*)    GFR, Estimated 56 (*)    All other components within normal limits  CBC WITH DIFFERENTIAL/PLATELET - Abnormal;  Notable for the following components:   WBC 16.9 (*)    Neutro Abs 14.1 (*)    Monocytes Absolute 1.2 (*)    Abs Immature Granulocytes 0.14 (*)    All other components within normal limits  RESP PANEL BY RT-PCR (FLU A&B, COVID) ARPGX2  CULTURE, BLOOD (ROUTINE X 2)  CULTURE, BLOOD (ROUTINE X 2)  URINE CULTURE  LACTIC ACID, PLASMA  PROTIME-INR  APTT  LACTIC ACID, PLASMA  URINALYSIS, ROUTINE W REFLEX MICROSCOPIC    EKG None  Radiology DG Chest Port 1 View  Result Date: 11/16/2021 CLINICAL DATA:  Fatigue, fever, sepsis, prostate cancer EXAM: PORTABLE CHEST 1 VIEW COMPARISON:  10/22/2021 FINDINGS: Single frontal view of the chest demonstrates a stable right chest wall port. Cardiac silhouette is unremarkable. No acute airspace disease, effusion, or pneumothorax. No acute bony abnormality. IMPRESSION: 1. No acute intrathoracic process. Electronically Signed   By: Randa Ngo M.D.   On: 11/16/2021 23:03    Procedures Procedures   Medications Ordered in ED Medications  sodium chloride 0.9 % bolus 1,000 mL (1,000 mLs Intravenous New Bag/Given 11/16/21 2317)  cefTRIAXone (ROCEPHIN) 2 g in sodium chloride 0.9 % 100 mL IVPB (2 g Intravenous New Bag/Given 11/16/21 2316)    ED Course  I have reviewed the triage vital signs and the nursing notes.  Pertinent labs & imaging results that were available during my care of the patient were reviewed by me and considered in my medical decision making (see chart for details).  Clinical Course as of 11/16/21 2348  Fri Nov 16, 2021  2326 Chest x-ray unremarkable showing no signs of pneumonia [BM]    Clinical Course User Index [BM] Noemi Chapel, MD   MDM Rules/Calculators/A&P  This patient presents to the ED for concern of Possible infection,, this involves an extensive number of treatment options, and is a complaint that carries with it a high risk of complications and morbidity.  The differential diagnosis  includes Sepsis, urinary tract infection, pyelonephritis, would also consider viral illnesses such as COVID-19 or flu, seems less likely to be pulmonary infections given no respiratory symptoms at oxygen level of around 95% on room air   Co morbidities that complicate the patient evaluation  Cancer, immunosuppressive therapy, hypercholesterolemia, hypertension   Additional history obtained:  Additional history obtained from family member and medical record review External records from outside source obtained and reviewed including The patient is getting active treatments with oncology,   Lab Tests:  I Ordered, and personally interpreted labs.  The pertinent results include:  Sepsis work-up   Imaging Studies ordered:   I ordered imaging studies including Portable chest x-ray  I independently visualized and interpreted imaging which showed Pending at the time of change of shift I agree with the radiologist interpretation   Cardiac Monitoring:  The patient was maintained on a cardiac monitor.  I personally viewed and interpreted the cardiac monitored which showed an underlying rhythm of: Normal sinus rhythm, borderline tachycardia   Medicines ordered and prescription drug management:  I ordered medication including Rocephin and fluids  for presumed urosepsis  Reevaluation of the patient after these medicines showed that the patient improved I have reviewed the patients home medicines and have made adjustments as needed   Test Considered:  CT abd - but not needed on initial exam with neg abd ttp and no CVA ttp   Critical Interventions:  Fluids antibiotics   Problem List / ED Course:  Infection likely  Change of shift:  Care signed out to Dr. Wyvonnia Dusky to follow up results and disposition accordingly.     Final Clinical Impression(s) / ED Diagnoses Final diagnoses:  None    Rx / DC Orders ED Discharge Orders     None        Noemi Chapel, MD 11/16/21  Rosey Bath    Noemi Chapel, MD 11/16/21 [phone]

## 2021-11-16 NOTE — ED Triage Notes (Signed)
Pt to ED from home with c/o emesis. Pt also c/o fatigue. These symptoms started last night. Pt has also had fever- 102 at home, given tylenol at home prior to arrival. Pt was hospitalized at [facility] oct 26 for sepsis and PNA. Pt currently on immunotherapy, just recently finished chemo for bladder CA.

## 2021-11-17 DIAGNOSIS — E876 Hypokalemia: Secondary | ICD-10-CM | POA: Diagnosis present

## 2021-11-17 DIAGNOSIS — Z888 Allergy status to other drugs, medicaments and biological substances status: Secondary | ICD-10-CM | POA: Diagnosis not present

## 2021-11-17 DIAGNOSIS — Z906 Acquired absence of other parts of urinary tract: Secondary | ICD-10-CM | POA: Diagnosis not present

## 2021-11-17 DIAGNOSIS — I10 Essential (primary) hypertension: Secondary | ICD-10-CM

## 2021-11-17 DIAGNOSIS — Z8616 Personal history of COVID-19: Secondary | ICD-10-CM | POA: Diagnosis not present

## 2021-11-17 DIAGNOSIS — N390 Urinary tract infection, site not specified: Secondary | ICD-10-CM | POA: Diagnosis present

## 2021-11-17 DIAGNOSIS — E782 Mixed hyperlipidemia: Secondary | ICD-10-CM

## 2021-11-17 DIAGNOSIS — Z20822 Contact with and (suspected) exposure to covid-19: Secondary | ICD-10-CM | POA: Diagnosis present

## 2021-11-17 DIAGNOSIS — Y732 Prosthetic and other implants, materials and accessory gastroenterology and urology devices associated with adverse incidents: Secondary | ICD-10-CM | POA: Diagnosis present

## 2021-11-17 DIAGNOSIS — Z87891 Personal history of nicotine dependence: Secondary | ICD-10-CM | POA: Diagnosis not present

## 2021-11-17 DIAGNOSIS — D72829 Elevated white blood cell count, unspecified: Secondary | ICD-10-CM

## 2021-11-17 DIAGNOSIS — F32A Depression, unspecified: Secondary | ICD-10-CM | POA: Diagnosis present

## 2021-11-17 DIAGNOSIS — N99521 Infection of other external stoma of urinary tract: Secondary | ICD-10-CM | POA: Diagnosis present

## 2021-11-17 DIAGNOSIS — M069 Rheumatoid arthritis, unspecified: Secondary | ICD-10-CM | POA: Diagnosis present

## 2021-11-17 DIAGNOSIS — K219 Gastro-esophageal reflux disease without esophagitis: Secondary | ICD-10-CM

## 2021-11-17 DIAGNOSIS — N1831 Chronic kidney disease, stage 3a: Secondary | ICD-10-CM | POA: Diagnosis present

## 2021-11-17 DIAGNOSIS — Z947 Corneal transplant status: Secondary | ICD-10-CM | POA: Diagnosis not present

## 2021-11-17 DIAGNOSIS — E785 Hyperlipidemia, unspecified: Secondary | ICD-10-CM | POA: Diagnosis present

## 2021-11-17 DIAGNOSIS — C679 Malignant neoplasm of bladder, unspecified: Secondary | ICD-10-CM | POA: Diagnosis present

## 2021-11-17 DIAGNOSIS — I129 Hypertensive chronic kidney disease with stage 1 through stage 4 chronic kidney disease, or unspecified chronic kidney disease: Secondary | ICD-10-CM | POA: Diagnosis present

## 2021-11-17 LAB — CBC
HCT: 40.3 % (ref 39.0–52.0)
Hemoglobin: 12.8 g/dL — ABNORMAL LOW (ref 13.0–17.0)
MCH: 29.6 pg (ref 26.0–34.0)
MCHC: 31.8 g/dL (ref 30.0–36.0)
MCV: 93.1 fL (ref 80.0–100.0)
Platelets: 200 10*3/uL (ref 150–400)
RBC: 4.33 MIL/uL (ref 4.22–5.81)
RDW: 15.5 % (ref 11.5–15.5)
WBC: 15.4 10*3/uL — ABNORMAL HIGH (ref 4.0–10.5)
nRBC: 0 % (ref 0.0–0.2)

## 2021-11-17 LAB — URINALYSIS, ROUTINE W REFLEX MICROSCOPIC
Bilirubin Urine: NEGATIVE
Glucose, UA: NEGATIVE mg/dL
Ketones, ur: NEGATIVE mg/dL
Nitrite: POSITIVE — AB
Protein, ur: 30 mg/dL — AB
Specific Gravity, Urine: 1.016 (ref 1.005–1.030)
WBC, UA: >50 WBC/hpf — ABNORMAL HIGH (ref 0–5)
pH: 5 (ref 5.0–8.0)

## 2021-11-17 LAB — COMPREHENSIVE METABOLIC PANEL
ALT: 15 U/L (ref 0–44)
AST: 13 U/L — ABNORMAL LOW (ref 15–41)
Albumin: 3.2 g/dL — ABNORMAL LOW (ref 3.5–5.0)
Alkaline Phosphatase: 61 U/L (ref 38–126)
Anion gap: 9 (ref 5–15)
BUN: 23 mg/dL — ABNORMAL HIGH (ref 6–20)
CO2: 23 mmol/L (ref 22–32)
Calcium: 8.5 mg/dL — ABNORMAL LOW (ref 8.9–10.3)
Chloride: 102 mmol/L (ref 98–111)
Creatinine, Ser: 1.41 mg/dL — ABNORMAL HIGH (ref 0.61–1.24)
GFR, Estimated: 57 mL/min — ABNORMAL LOW (ref >60)
Glucose, Bld: 110 mg/dL — ABNORMAL HIGH (ref 70–99)
Potassium: 3.4 mmol/L — ABNORMAL LOW (ref 3.5–5.1)
Sodium: 134 mmol/L — ABNORMAL LOW (ref 135–145)
Total Bilirubin: 0.5 mg/dL (ref 0.3–1.2)
Total Protein: 6.8 g/dL (ref 6.5–8.1)

## 2021-11-17 LAB — RESP PANEL BY RT-PCR (FLU A&B, COVID) ARPGX2
Influenza A by PCR: NEGATIVE
Influenza B by PCR: NEGATIVE
SARS Coronavirus 2 by RT PCR: NEGATIVE

## 2021-11-17 LAB — HIV ANTIBODY (ROUTINE TESTING W REFLEX): HIV Screen 4th Generation wRfx: NONREACTIVE

## 2021-11-17 LAB — MAGNESIUM: Magnesium: 1.5 mg/dL — ABNORMAL LOW (ref 1.7–2.4)

## 2021-11-17 LAB — PHOSPHORUS: Phosphorus: 2.6 mg/dL (ref 2.5–4.6)

## 2021-11-17 LAB — LACTIC ACID, PLASMA: Lactic Acid, Venous: 0.9 mmol/L (ref 0.5–1.9)

## 2021-11-17 MED ORDER — AMLODIPINE BESYLATE 5 MG PO TABS
10.0000 mg | ORAL_TABLET | Freq: Every day | ORAL | Status: DC
  Administered 2021-11-17: 10 mg via ORAL
  Filled 2021-11-17: qty 2

## 2021-11-17 MED ORDER — PANTOPRAZOLE SODIUM 40 MG PO TBEC
80.0000 mg | DELAYED_RELEASE_TABLET | Freq: Every day | ORAL | Status: DC
  Administered 2021-11-17: 80 mg via ORAL
  Filled 2021-11-17: qty 2

## 2021-11-17 MED ORDER — MAGNESIUM SULFATE 2 GM/50ML IV SOLN
2.0000 g | Freq: Once | INTRAVENOUS | Status: AC
Start: 2021-11-17 — End: 2021-11-17
  Administered 2021-11-17: 2 g via INTRAVENOUS
  Filled 2021-11-17: qty 50

## 2021-11-17 MED ORDER — ROSUVASTATIN CALCIUM 20 MG PO TABS
20.0000 mg | ORAL_TABLET | Freq: Every day | ORAL | Status: DC
  Administered 2021-11-17: 20 mg via ORAL
  Filled 2021-11-17: qty 1

## 2021-11-17 MED ORDER — CEFDINIR 300 MG PO CAPS: 300.0000 mg | ORAL_CAPSULE | Freq: Two times a day (BID) | ORAL | 0 refills | Status: AC

## 2021-11-17 MED ORDER — POTASSIUM CHLORIDE CRYS ER 20 MEQ PO TBCR
40.0000 meq | EXTENDED_RELEASE_TABLET | Freq: Once | ORAL | Status: AC
  Administered 2021-11-17: 40 meq via ORAL
  Filled 2021-11-17: qty 2

## 2021-11-17 MED ORDER — ENOXAPARIN SODIUM 40 MG/0.4ML IJ SOSY
40.0000 mg | PREFILLED_SYRINGE | INTRAMUSCULAR | Status: DC
  Administered 2021-11-17: 40 mg via SUBCUTANEOUS
  Filled 2021-11-17: qty 0.4

## 2021-11-17 MED ORDER — ACETAMINOPHEN 325 MG PO TABS
650.0000 mg | ORAL_TABLET | Freq: Four times a day (QID) | ORAL | Status: DC | PRN
  Administered 2021-11-17: 650 mg via ORAL
  Filled 2021-11-17: qty 2

## 2021-11-17 MED ORDER — SERTRALINE HCL 50 MG PO TABS
50.0000 mg | ORAL_TABLET | Freq: Every day | ORAL | Status: DC
  Administered 2021-11-17: 50 mg via ORAL
  Filled 2021-11-17: qty 1

## 2021-11-17 MED ORDER — ACETAMINOPHEN 650 MG RE SUPP: 650.0000 mg | Freq: Four times a day (QID) | RECTAL | Status: DC | PRN

## 2021-11-17 NOTE — ED Provider Notes (Signed)
Urinalysis suspicious for infection.  Patient denies any flank pain or abdominal pain.  We will treat with IV fluids and IV antibiotics.  He does meet sepsis criteria with tachycardia, leukocytosis and fever.  Admission discussed with Dr. Josephine Cables.    Ezequiel Essex, MD 11/17/21 [phone]

## 2021-11-17 NOTE — Progress Notes (Signed)
Nsg Discharge Note  Admit Date:  11/16/2021 Discharge date: 11/17/2021   Barry Gibson to be D/C'd Home per MD order.  AVS completed.  Copy for chart, and copy for patient signed, and dated. Patient/caregiver able to verbalize understanding.  Discharge Medication: Allergies as of 11/17/2021       Reactions   Ace Inhibitors Cough   Diflucan [fluconazole] Other (See Comments)   Irritated ulcers        Medication List     TAKE these medications    acetaminophen 650 MG CR tablet Commonly known as: TYLENOL Take 1,300 mg by mouth every morning.   amLODipine 10 MG tablet Commonly known as: NORVASC Take 1 tablet (10 mg total) by mouth daily.   cefdinir 300 MG capsule Commonly known as: OMNICEF Take 1 capsule (300 mg total) by mouth 2 (two) times daily for 7 days.   diphenoxylate-atropine 2.5-0.025 MG tablet Commonly known as: Lomotil Take 1 tablet by mouth 4 (four) times daily as needed for diarrhea or loose stools.   feeding supplement Liqd Take 237 mLs by mouth 2 (two) times daily between meals.   lidocaine-prilocaine cream Commonly known as: EMLA Apply 1 application topically as needed.   multivitamin with minerals Tabs tablet Take 1 tablet by mouth daily.   omeprazole 40 MG capsule Commonly known as: PRILOSEC Take 40 mg by mouth daily.   prednisoLONE acetate 1 % ophthalmic suspension Commonly known as: PRED FORTE Place 1 drop into the left eye 2 (two) times a week. Instill 1 drop into left eye Monday and Friday.   prochlorperazine 10 MG tablet Commonly known as: COMPAZINE TAKE 1 TABLET(10 MG) BY MOUTH EVERY 6 HOURS AS NEEDED FOR NAUSEA OR VOMITING   rosuvastatin 20 MG tablet Commonly known as: Crestor Take 1 tablet (20 mg total) by mouth daily.   sertraline 50 MG tablet Commonly known as: ZOLOFT TAKE 1 TABLET(50 MG) BY MOUTH DAILY        Discharge Assessment: Vitals:   11/17/21 0217 11/17/21 0557  BP: 137/65 108/77  Pulse: (!) 106 94   Resp: 20 18  Temp: 99.8 F (37.7 C) 99.6 F (37.6 C)  SpO2: 98% 97%   Skin clean, dry and intact without evidence of skin break down, no evidence of skin tears noted. IV catheter discontinued intact. Site without signs and symptoms of complications - no redness or edema noted at insertion site, patient denies c/o pain - only slight tenderness at site.  Dressing with slight pressure applied.  D/c Instructions-Education: Discharge instructions given to patient/family with verbalized understanding. D/c education completed with patient/family including follow up instructions, medication list, d/c activities limitations if indicated, with other d/c instructions as indicated by MD - patient able to verbalize understanding, all questions fully answered. Patient instructed to return to ED, call 911, or call MD for any changes in condition.  Patient escorted via [place], and D/C home via private auto.  Clovis Fredrickson, LPN 73/22/0254 27:06 PM

## 2021-11-17 NOTE — Sepsis Progress Note (Signed)
Following for sepsis monitoring ?

## 2021-11-17 NOTE — ED Notes (Signed)
Provider at bedside

## 2021-11-17 NOTE — H&P (Signed)
History and Physical  Barry Gibson NWG:[id number] DOB: 16-Jan-1961 DOA: 11/16/2021  Referring physician: Ezequiel Essex, MD PCP: Coral Spikes, DO  Patient coming from: Home  Chief Complaint: Generalized weakness.  HPI: Barry Gibson is a 60 y.o. male with medical history significant for bladder cancer status post cystectomy and lymphadenectomy (January 2022), and diverting urostomy in right lower abdomen.  Patient complained of 1 day onset of generalized weakness with a temperature as high as 102F at home.  This was associated with nonbloody vomiting, patient states that he had similar symptoms when he had prior UTI, so he decided to go to the ED for further evaluation and management.  He denies chest pain, shortness of breath, diarrhea, cough, sore throat or any other URI symptoms.  Patient is on [facility] for ongoing chemotherapy.  ED Course:  In the emergency department, he was hemodynamically stable.  Work-up in the ED showed normal CBC except for leukocytosis, BMP showed hypokalemia, mild hyponatremia, BUN to creatinine showed 26/1.45 (baseline creatinine at 1.2-1.3), lactic acid was negative, urinalysis was positive for UTI.  Influenza A, B, SARS coronavirus 2 was negative. Chest x-ray showed no acute intrathoracic process Patient was started on IV ceftriaxone, IV hydration was provided.  Hospitalist was asked to admit patient for further evaluation and management.  Review of Systems: A full 10 point Review of Systems was done, except as stated above, all other Review of systems were negative.  Past Medical History:  Diagnosis Date   Cancer [facility])    bladder   Cataract    COVID-19 11/13/2019   h/a, chills, fatigue, loss of taste and smell, all symptoms resolved in a few weeks   Frequent urination    Fuchs' corneal dystrophy    Full dentures    GERD (gastroesophageal reflux disease)    Hypertension    Rheumatoid arthritis ([place])    Past Surgical History:  Procedure  Laterality Date   CORNEAL TRANSPLANT Left 2013   CYSTOSCOPY WITH BIOPSY N/A 09/14/2019   Procedure: CYSTOSCOPY WITH BIOPSY;  Surgeon: Franchot Gallo, MD;  Location: [facility];  Service: Urology;  Laterality: N/A;   CYSTOSCOPY WITH INJECTION N/A 11/29/2020   Procedure: CYSTOSCOPY WITH INJECTION OF INDOCYANINE GREEN DYE;  Surgeon: Barry Frock, MD;  Location: [facility];  Service: Urology;  Laterality: N/A;  6 HRS   FULGURATION OF BLADDER TUMOR N/A 09/14/2019   Procedure: FULGURATION OF BLADDER TUMOR;  Surgeon: Franchot Gallo, MD;  Location: [facility];  Service: Urology;  Laterality: N/A;   IR IMAGING GUIDED PORT INSERTION  06/07/2021   IR NEPHROSTOMY EXCHANGE LEFT  10/30/2020   IR NEPHROSTOMY EXCHANGE LEFT  11/19/2020   IR NEPHROSTOMY EXCHANGE RIGHT  10/30/2020   IR NEPHROSTOMY EXCHANGE RIGHT  11/19/2020   IR NEPHROSTOMY PLACEMENT LEFT  09/27/2020   IR NEPHROSTOMY PLACEMENT RIGHT  09/27/2020   ROBOT ASSISTED LAPAROSCOPIC COMPLETE CYSTECT ILEAL CONDUIT N/A 11/29/2020   Procedure: XI ROBOTIC ASSISTED LAPAROSCOPIC COMPLETE CYSTECT ILEAL CONDUIT/RADICAL PROSTATECTOMY/LYMPHADENECTOMY;  Surgeon: Barry Frock, MD;  Location: [facility];  Service: Urology;  Laterality: N/A;   TRANSURETHRAL RESECTION OF BLADDER TUMOR WITH MITOMYCIN-C N/A 08/05/2019   Procedure: TRANSURETHRAL RESECTION OF BLADDER TUMOR;  Surgeon: Franchot Gallo, MD;  Location[facility];  Service: Urology;  Laterality: N/A;  1 HR   TRANSURETHRAL RESECTION OF BLADDER TUMOR WITH MITOMYCIN-C N/A 04/24/2020   Procedure: TRANSURETHRAL RESECTION OF BLADDER TUMOR WITH GEMCIDABINE;  Surgeon: Franchot Gallo, MD;  Location[facility];  Service: Urology;  Laterality: N/A;    Social History:  reports that he has quit smoking. His smoking use included cigarettes. He has a 10.00 pack-year smoking history. He has never used smokeless tobacco. He reports current alcohol use. He reports that he does not use  drugs.   Allergies  Allergen Reactions   Ace Inhibitors Cough   Diflucan [Fluconazole] Other (See Comments)    Irritated ulcers    No known family history  Prior to Admission medications   Medication Sig Start Date End Date Taking? Authorizing Provider  acetaminophen (TYLENOL) 650 MG CR tablet Take 1,300 mg by mouth every morning.    [provider]  amLODipine (NORVASC) 10 MG tablet Take 1 tablet (10 mg total) by mouth daily. 05/07/21   Lovena Le, Malena M, DO  diphenoxylate-atropine (LOMOTIL) 2.5-0.025 MG tablet Take 1 tablet by mouth 4 (four) times daily as needed for diarrhea or loose stools. 10/15/21   Wyatt Portela, MD  feeding supplement (ENSURE ENLIVE / ENSURE PLUS) LIQD Take 237 mLs by mouth 2 (two) times daily between meals. 09/18/21   Debbe Odea, MD  lidocaine-prilocaine (EMLA) cream Apply 1 application topically as needed. 05/18/21   Wyatt Portela, MD  Multiple Vitamin (MULTIVITAMIN WITH MINERALS) TABS tablet Take 1 tablet by mouth daily. 09/18/21   Debbe Odea, MD  omeprazole (PRILOSEC) 40 MG capsule Take 40 mg by mouth daily.    [provider]  prednisoLONE acetate (PRED FORTE) 1 % ophthalmic suspension Place 1 drop into the left eye 2 (two) times a week. Instill 1 drop into left eye Monday and Friday.    [provider]  prochlorperazine (COMPAZINE) 10 MG tablet TAKE 1 TABLET(10 MG) BY MOUTH EVERY 6 HOURS AS NEEDED FOR NAUSEA OR VOMITING 07/24/21   Wyatt Portela, MD  rosuvastatin (CRESTOR) 20 MG tablet Take 1 tablet (20 mg total) by mouth daily. 09/21/21   Kathyrn Drown, MD  sertraline (ZOLOFT) 50 MG tablet TAKE 1 TABLET(50 MG) BY MOUTH DAILY 08/06/21   [name], DO    Physical Exam: BP 137/65    Pulse (!) 106    Temp 99.8 F (37.7 C)    Resp 20    Ht 5\' 11"  (1.803 m)    Wt 90.7 kg    SpO2 98%    BMI 27.89 kg/m   General: 60 y.o. year-old male well developed well nourished in no acute distress.  Alert and oriented x3. HEENT: NCAT,  EOMI Neck: Supple, trachea medial Cardiovascular: Regular rate and rhythm with no rubs or gallops.  No thyromegaly or JVD noted.  No lower extremity edema. 2/4 pulses in all 4 extremities. Respiratory: Clear to auscultation with no wheezes or rales. Good inspiratory effort. Abdomen: Soft, nontender nondistended with normal bowel sounds x4 quadrants, ostomy bag noted with yellow urine. Muskuloskeletal: Right chest wall port noted.  No cyanosis, clubbing or edema noted bilaterally Neuro: CN II-XII intact, strength 5/5 x 4, sensation, reflexes intact Skin: No ulcerative lesions noted or rashes Psychiatry: Judgement and insight appear normal. Mood is appropriate for condition and setting          Labs on Admission:  Basic Metabolic Panel: Recent Labs  Lab 11/16/21 2303  NA 133*  K 3.3*  CL 103  CO2 21*  GLUCOSE 127*  BUN 26*  CREATININE 1.45*  CALCIUM 8.9   Liver Function Tests: Recent Labs  Lab 11/16/21 2303  AST 15  ALT 17  ALKPHOS 66  BILITOT 0.7  PROT  7.2  ALBUMIN 3.6   No results for input(s): LIPASE, AMYLASE in the last 168 hours. No results for input(s): AMMONIA in the last 168 hours. CBC: Recent Labs  Lab 11/16/21 2303  WBC 16.9*  NEUTROABS 14.1*  HGB 13.9  HCT 42.7  MCV 92.6  PLT 203   Cardiac Enzymes: No results for input(s): CKTOTAL, CKMB, CKMBINDEX, TROPONINI in the last 168 hours.  BNP (last 3 results) No results for input(s): BNP in the last 8760 hours.  ProBNP (last 3 results) No results for input(s): PROBNP in the last 8760 hours.  CBG: No results for input(s): GLUCAP in the last 168 hours.  Radiological Exams on Admission: DG Chest Port 1 View  Result Date: 11/16/2021 CLINICAL DATA:  Fatigue, fever, sepsis, prostate cancer EXAM: PORTABLE CHEST 1 VIEW COMPARISON:  10/22/2021 FINDINGS: Single frontal view of the chest demonstrates a stable right chest wall port. Cardiac silhouette is unremarkable. No acute airspace disease, effusion, or  pneumothorax. No acute bony abnormality. IMPRESSION: 1. No acute intrathoracic process. Electronically Signed   By: Randa Ngo M.D.   On: 11/16/2021 23:03    EKG: I independently viewed the EKG done and my findings are as followed: Normal sinus rhythm at a rate of 90 bpm  Assessment/Plan Present on Admission:  UTI (urinary tract infection)  Bladder cancer ([place])  Essential hypertension, benign  Hyperlipidemia  Principal Problem:   UTI (urinary tract infection) Active Problems:   Hyperlipidemia   Essential hypertension, benign   Bladder cancer ([facility])   Leukocytosis   Hypokalemia   GERD (gastroesophageal reflux disease)   Depression  UTI POA Patient was started on IV ceftriaxone, we shall continue same at this time Continue Tylenol as needed for fever Urinalysis was positive for UTI, urine culture pending  Leukocytosis possibly secondary to above vs reactive process WBC 16.9, continue management as described above  Hypokalemia K+ is 3.3 K+ will be replenished Please monitor for AM K+ for further replenishmemnt  Bladder cancer Patient was diagnosed with 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 Patient currently on chemotherapy Beryle Flock) A follows with Dr. Zola Button  CKD stage 3A BUN to creatinine showed 26/1.45 (baseline creatinine at 1.2-1.3) Renally adjust medications, avoid nephrotoxic agents/dehydration/hypotension  Essential hypertension Continue amlodipine  GERD Continue Protonix  Hyperlipidemia Continue Crestor  Depression Continue Zoloft  DVT prophylaxis: Lovenox  Code Status: Full code  Family Communication: None at bedside  Disposition Plan:  Patient is from:                        home Anticipated DC to:                   SNF or family members home Anticipated DC date:               2-3 days Anticipated DC barriers:          Patient requires inpatient management due to severity of symptoms    Consults called: None  Admission status: Inpatient    [name] MD [facility]  11/17/2021, 3:59 AM

## 2021-11-17 NOTE — Discharge Summary (Signed)
Physician Discharge Summary  Barry Gibson CXK:[id number] DOB: June 29, 1961 DOA: 11/16/2021  PCP: Coral Spikes, DO  Admit date: 11/16/2021  Discharge date: 11/17/2021  Admitted From:Home  Disposition:  Home  Recommendations for Outpatient Follow-up:  Follow up with PCP in 1-2 weeks Continue on cefdinir as prescribed for UTI for 7 days Continue other home medications as previous  Home Health: None  Equipment/Devices: None  Discharge Condition:Stable  CODE STATUS: Full  Diet recommendation: Heart Healthy  Brief/Interim Summary: Barry Gibson is a 60 y.o. male with medical history significant for bladder cancer status post cystectomy and lymphadenectomy (January 2022), and diverting urostomy in right lower abdomen.  Patient complained of 1 day onset of generalized weakness with a temperature as high as 102F at home.  Patient was noted to have signs of UTI on urinalysis and was admitted for treatment.  He was started on Rocephin empirically with urine culture still pending.  Previously he has had multiple species on collection of his urine.  He is overall feeling much better and back to his usual baseline with no further fevers and is eager for discharge.  He may discharge home with cefdinir as prescribed.  No other acute events noted during the course of this admission.  Discharge Diagnoses:  Principal Problem:   UTI (urinary tract infection) Active Problems:   Hyperlipidemia   Essential hypertension, benign   Bladder cancer (HCC)   Leukocytosis   Hypokalemia   GERD (gastroesophageal reflux disease)   Depression  Principal discharge diagnosis: UTI in the setting of urostomy.  Discharge Instructions  Discharge Instructions     Diet - low sodium heart healthy   Complete by: As directed    Increase activity slowly   Complete by: As directed       Allergies as of 11/17/2021       Reactions   Ace Inhibitors Cough   Diflucan [fluconazole] Other (See Comments)    Irritated ulcers        Medication List     TAKE these medications    acetaminophen 650 MG CR tablet Commonly known as: TYLENOL Take 1,300 mg by mouth every morning.   amLODipine 10 MG tablet Commonly known as: NORVASC Take 1 tablet (10 mg total) by mouth daily.   cefdinir 300 MG capsule Commonly known as: OMNICEF Take 1 capsule (300 mg total) by mouth 2 (two) times daily for 7 days.   diphenoxylate-atropine 2.5-0.025 MG tablet Commonly known as: Lomotil Take 1 tablet by mouth 4 (four) times daily as needed for diarrhea or loose stools.   feeding supplement Liqd Take 237 mLs by mouth 2 (two) times daily between meals.   lidocaine-prilocaine cream Commonly known as: EMLA Apply 1 application topically as needed.   multivitamin with minerals Tabs tablet Take 1 tablet by mouth daily.   omeprazole 40 MG capsule Commonly known as: PRILOSEC Take 40 mg by mouth daily.   prednisoLONE acetate 1 % ophthalmic suspension Commonly known as: PRED FORTE Place 1 drop into the left eye 2 (two) times a week. Instill 1 drop into left eye Monday and Friday.   prochlorperazine 10 MG tablet Commonly known as: COMPAZINE TAKE 1 TABLET(10 MG) BY MOUTH EVERY 6 HOURS AS NEEDED FOR NAUSEA OR VOMITING   rosuvastatin 20 MG tablet Commonly known as: Crestor Take 1 tablet (20 mg total) by mouth daily.   sertraline 50 MG tablet Commonly known as: ZOLOFT TAKE 1 TABLET(50 MG) BY MOUTH DAILY  Follow-up Information     Coral Spikes, DO. Schedule an appointment as soon as possible for a visit in 1 week(s).   Specialty: Family Medicine Contact information: [place] [address] [phone]                Allergies  Allergen Reactions   Ace Inhibitors Cough   Diflucan [Fluconazole] Other (See Comments)    Irritated ulcers    Consultations: None   Procedures/Studies: DG Chest 2 View  Result Date: 10/23/2021 CLINICAL DATA:  Pneumonia,  followup. EXAM: CHEST - 2 VIEW COMPARISON:  X-ray chest 09/14/2021. FINDINGS: Chest port catheter tip overlies the superior cavoatrial junction. Unchanged cardiomediastinal silhouette. Nearly resolved left lower lobe airspace disease. No new airspace disease. No pleural effusion. No visible pneumothorax. No acute osseous abnormality. IMPRESSION: Nearly resolved left lower lobe airspace disease. No new airspace disease. Electronically Signed   By: Maurine Simmering M.D.   On: 10/23/2021 13:20   DG Chest Port 1 View  Result Date: 11/16/2021 CLINICAL DATA:  Fatigue, fever, sepsis, prostate cancer EXAM: PORTABLE CHEST 1 VIEW COMPARISON:  10/22/2021 FINDINGS: Single frontal view of the chest demonstrates a stable right chest wall port. Cardiac silhouette is unremarkable. No acute airspace disease, effusion, or pneumothorax. No acute bony abnormality. IMPRESSION: 1. No acute intrathoracic process. Electronically Signed   By: Randa Ngo M.D.   On: 11/16/2021 23:03     Discharge Exam: Vitals:   11/17/21 0217 11/17/21 0557  BP: 137/65 108/77  Pulse: (!) 106 94  Resp: 20 18  Temp: 99.8 F (37.7 C) 99.6 F (37.6 C)  SpO2: 98% 97%   Vitals:   11/17/21 0030 11/17/21 0130 11/17/21 0217 11/17/21 0557  BP: 120/78  137/65 108/77  Pulse: 78  (!) 106 94  Resp: 18  20 18   Temp:  98.9 F (37.2 C) 99.8 F (37.7 C) 99.6 F (37.6 C)  TempSrc:  Oral    SpO2: 93%  98% 97%  Weight:   90.7 kg   Height:   5\' 11"  (1.803 m)     General: Pt is alert, awake, not in acute distress Cardiovascular: RRR, S1/S2 +, no rubs, no gallops Respiratory: CTA bilaterally, no wheezing, no rhonchi Abdominal: Soft, NT, ND, bowel sounds + Extremities: no edema, no cyanosis    The results of significant diagnostics from this hospitalization (including imaging, microbiology, ancillary and laboratory) are listed below for reference.     Microbiology: Recent Results (from the past 240 hour(s))  Blood Culture (routine x 2)      Status: None (Preliminary result)   Collection Time: 11/16/21 11:03 PM   Specimen: BLOOD RIGHT FOREARM  Result Value Ref Range Status   Specimen Description   Final    BLOOD RIGHT FOREARM BOTTLES DRAWN AEROBIC AND ANAEROBIC   Special Requests   Final    Blood Culture results may not be optimal due to an excessive volume of blood received in culture bottles   Culture   Final    NO GROWTH < 12 HOURS Performed at [facility], [address] [place], [place] [address]    Report Status PENDING  Incomplete  Blood Culture (routine x 2)     Status: None (Preliminary result)   Collection Time: 11/16/21 11:03 PM   Specimen: Left Antecubital; Blood  Result Value Ref Range Status   Specimen Description   Final    LEFT ANTECUBITAL BOTTLES DRAWN AEROBIC AND ANAEROBIC   Special Requests Blood Culture  adequate volume  Final   Culture   Final    NO GROWTH < 12 HOURS Performed at [facility], [address] [place], [place] [address]    Report Status PENDING  Incomplete  Resp Panel by RT-PCR (Flu A&B, Covid) Nasopharyngeal Swab     Status: None   Collection Time: 11/16/21 11:20 PM   Specimen: Nasopharyngeal Swab; Nasopharyngeal(NP) swabs in vial transport medium  Result Value Ref Range Status   SARS Coronavirus 2 by RT PCR NEGATIVE NEGATIVE Final    Comment: (NOTE) SARS-CoV-2 target nucleic acids are NOT DETECTED.  The SARS-CoV-2 RNA is generally detectable in upper respiratory specimens during the acute phase of infection. The lowest concentration of SARS-CoV-2 viral copies this assay can detect is 138 copies/mL. A negative result does not preclude SARS-Cov-2 infection and should not be used as the sole basis for treatment or other patient management decisions. A negative result may occur with  improper specimen collection/handling, submission of specimen other than nasopharyngeal swab, presence of viral mutation(s) within the areas targeted by this assay, and inadequate number of  viral copies(<138 copies/mL). A negative result must be combined with clinical observations, patient history, and epidemiological information. The expected result is Negative.  Fact Sheet for Patients:  EntrepreneurPulse.com.au  Fact Sheet for Healthcare Providers:  IncredibleEmployment.be  This test is no t yet approved or cleared by the [place] FDA and  has been authorized for detection and/or diagnosis of SARS-CoV-2 by FDA under an Emergency Use Authorization (EUA). This EUA will remain  in effect (meaning this test can be used) for the duration of the COVID-19 declaration under Section 564(b)(1) of the Act, 21 U.S.C.section 360bbb-3(b)(1), unless the authorization is terminated  or revoked sooner.       Influenza A by PCR NEGATIVE NEGATIVE Final   Influenza B by PCR NEGATIVE NEGATIVE Final    Comment: (NOTE) The Xpert Xpress SARS-CoV-2/FLU/RSV plus assay is intended as an aid in the diagnosis of influenza from Nasopharyngeal swab specimens and should not be used as a sole basis for treatment. Nasal washings and aspirates are unacceptable for Xpert Xpress SARS-CoV-2/FLU/RSV testing.  Fact Sheet for Patients: EntrepreneurPulse.com.au  Fact Sheet for Healthcare Providers: IncredibleEmployment.be  This test is not yet approved or cleared by the [place] FDA and has been authorized for detection and/or diagnosis of SARS-CoV-2 by FDA under an Emergency Use Authorization (EUA). This EUA will remain in effect (meaning this test can be used) for the duration of the COVID-19 declaration under Section 564(b)(1) of the Act, 21 U.S.C. section 360bbb-3(b)(1), unless the authorization is terminated or revoked.  Performed at [facility], [address], [place], [place] [address]      Labs: BNP (last 3 results) No results for input(s): BNP in the last 8760 hours. Basic Metabolic Panel: Recent Labs   Lab 11/16/21 2303 11/17/21 0423  NA 133* 134*  K 3.3* 3.4*  CL 103 102  CO2 21* 23  GLUCOSE 127* 110*  BUN 26* 23*  CREATININE 1.45* 1.41*  CALCIUM 8.9 8.5*  MG  --  1.5*  PHOS  --  2.6   Liver Function Tests: Recent Labs  Lab 11/16/21 2303 11/17/21 0423  AST 15 13*  ALT 17 15  ALKPHOS 66 61  BILITOT 0.7 0.5  PROT 7.2 6.8  ALBUMIN 3.6 3.2*   No results for input(s): LIPASE, AMYLASE in the last 168 hours. No results for input(s): AMMONIA in the last 168 hours. CBC: Recent Labs  Lab  11/16/21 2303 11/17/21 0423  WBC 16.9* 15.4*  NEUTROABS 14.1*  --   HGB 13.9 12.8*  HCT 42.7 40.3  MCV 92.6 93.1  PLT 203 200   Cardiac Enzymes: No results for input(s): CKTOTAL, CKMB, CKMBINDEX, TROPONINI in the last 168 hours. BNP: Invalid input(s): POCBNP CBG: No results for input(s): GLUCAP in the last 168 hours. D-Dimer No results for input(s): DDIMER in the last 72 hours. Hgb A1c No results for input(s): HGBA1C in the last 72 hours. Lipid Profile No results for input(s): CHOL, HDL, LDLCALC, TRIG, CHOLHDL, LDLDIRECT in the last 72 hours. Thyroid function studies No results for input(s): TSH, T4TOTAL, T3FREE, THYROIDAB in the last 72 hours.  Invalid input(s): FREET3 Anemia work up No results for input(s): VITAMINB12, FOLATE, FERRITIN, TIBC, IRON, RETICCTPCT in the last 72 hours. Urinalysis    Component Value Date/Time   COLORURINE YELLOW 11/16/2021 2340   APPEARANCEUR CLOUDY (A) 11/16/2021 2340   APPEARANCEUR Hazy (A) 09/22/2020 1521   LABSPEC 1.016 11/16/2021 2340   PHURINE 5.0 11/16/2021 2340   GLUCOSEU NEGATIVE 11/16/2021 2340   HGBUR SMALL (A) 11/16/2021 2340   BILIRUBINUR NEGATIVE 11/16/2021 2340   BILIRUBINUR Negative 09/22/2020 1521   KETONESUR NEGATIVE 11/16/2021 2340   PROTEINUR 30 (A) 11/16/2021 2340   UROBILINOGEN 0.2 06/09/2020 1536   NITRITE POSITIVE (A) 11/16/2021 2340   LEUKOCYTESUR LARGE (A) 11/16/2021 2340   Sepsis Labs Invalid input(s):  PROCALCITONIN,  WBC,  LACTICIDVEN Microbiology Recent Results (from the past 240 hour(s))  Blood Culture (routine x 2)     Status: None (Preliminary result)   Collection Time: 11/16/21 11:03 PM   Specimen: BLOOD RIGHT FOREARM  Result Value Ref Range Status   Specimen Description   Final    BLOOD RIGHT FOREARM BOTTLES DRAWN AEROBIC AND ANAEROBIC   Special Requests   Final    Blood Culture results may not be optimal due to an excessive volume of blood received in culture bottles   Culture   Final    NO GROWTH < 12 HOURS Performed at [facility], [address], [place], [place] [address]    Report Status PENDING  Incomplete  Blood Culture (routine x 2)     Status: None (Preliminary result)   Collection Time: 11/16/21 11:03 PM   Specimen: Left Antecubital; Blood  Result Value Ref Range Status   Specimen Description   Final    LEFT ANTECUBITAL BOTTLES DRAWN AEROBIC AND ANAEROBIC   Special Requests Blood Culture adequate volume  Final   Culture   Final    NO GROWTH < 12 HOURS Performed at [facility], [address], [place], [place] [address]    Report Status PENDING  Incomplete  Resp Panel by RT-PCR (Flu A&B, Covid) Nasopharyngeal Swab     Status: None   Collection Time: 11/16/21 11:20 PM   Specimen: Nasopharyngeal Swab; Nasopharyngeal(NP) swabs in vial transport medium  Result Value Ref Range Status   SARS Coronavirus 2 by RT PCR NEGATIVE NEGATIVE Final    Comment: (NOTE) SARS-CoV-2 target nucleic acids are NOT DETECTED.  The SARS-CoV-2 RNA is generally detectable in upper respiratory specimens during the acute phase of infection. The lowest concentration of SARS-CoV-2 viral copies this assay can detect is 138 copies/mL. A negative result does not preclude SARS-Cov-2 infection and should not be used as the sole basis for treatment or other patient management decisions. A negative result may occur with  improper specimen collection/handling, submission of specimen other than  nasopharyngeal swab, presence of  viral mutation(s) within the areas targeted by this assay, and inadequate number of viral copies(<138 copies/mL). A negative result must be combined with clinical observations, patient history, and epidemiological information. The expected result is Negative.  Fact Sheet for Patients:  EntrepreneurPulse.com.au  Fact Sheet for Healthcare Providers:  IncredibleEmployment.be  This test is no t yet approved or cleared by the [place] FDA and  has been authorized for detection and/or diagnosis of SARS-CoV-2 by FDA under an Emergency Use Authorization (EUA). This EUA will remain  in effect (meaning this test can be used) for the duration of the COVID-19 declaration under Section 564(b)(1) of the Act, 21 U.S.C.section 360bbb-3(b)(1), unless the authorization is terminated  or revoked sooner.       Influenza A by PCR NEGATIVE NEGATIVE Final   Influenza B by PCR NEGATIVE NEGATIVE Final    Comment: (NOTE) The Xpert Xpress SARS-CoV-2/FLU/RSV plus assay is intended as an aid in the diagnosis of influenza from Nasopharyngeal swab specimens and should not be used as a sole basis for treatment. Nasal washings and aspirates are unacceptable for Xpert Xpress SARS-CoV-2/FLU/RSV testing.  Fact Sheet for Patients: EntrepreneurPulse.com.au  Fact Sheet for Healthcare Providers: IncredibleEmployment.be  This test is not yet approved or cleared by the [place] FDA and has been authorized for detection and/or diagnosis of SARS-CoV-2 by FDA under an Emergency Use Authorization (EUA). This EUA will remain in effect (meaning this test can be used) for the duration of the COVID-19 declaration under Section 564(b)(1) of the Act, 21 U.S.C. section 360bbb-3(b)(1), unless the authorization is terminated or revoked.  Performed at [facility], [address] [place], [place] [address]       Time coordinating discharge: 35 minutes  SIGNED:   Rodena Goldmann, DO [facility] 11/17/2021, 12:11 PM  If 7PM-7AM, please contact night-coverage [email or web address]

## 2021-11-18 LAB — URINE CULTURE

## 2021-11-20 ENCOUNTER — Telehealth: Payer: Self-pay | Admitting: *Deleted

## 2021-11-20 ENCOUNTER — Telehealth: Payer: Self-pay

## 2021-11-20 NOTE — Telephone Encounter (Signed)
Transition Care Management Follow-up Telephone Call Date of discharge and from where: 11/16/2021 [facility] Diagnosis: UTI/Sepsis How have you been since you were released from the hospital? Spoke with pt's wife, Barnett Applebaum, and she states pt is doing well and is back at work. Any questions or concerns? No  Items Reviewed: Did the pt receive and understand the discharge instructions provided? Yes  Medications obtained and verified? Yes  Other? No  Any new allergies since your discharge? No  Dietary orders reviewed? Yes Do you have support at home? Yes   Home Care and Equipment/Supplies: Were home health services ordered? no If so, what is the name of the agency? N/A  Has the agency set up a time to come to the patient's home? not applicable Were any new equipment or medical supplies ordered?  No What is the name of the medical supply agency? N/A Were you able to get the supplies/equipment? not applicable Do you have any questions related to the use of the equipment or supplies? No  Functional Questionnaire: (I = Independent and D = Dependent) ADLs: I  Bathing/Dressing- I  Meal Prep- I  Eating- I  Maintaining continence- I  Transferring/Ambulation- I  Managing Meds- I  Follow up appointments reviewed:  PCP Hospital f/u appt confirmed?  PT'S WIFE DECLINED MAKING APPOINTMENT WITH DR. COOK. SHE STATES THEY WILL JUST FOLLOW UP AS NEEDED WITH DR. SHADAD.   [facility] f/u appt confirmed?  Dr. Zola Button on 12/21/2021 @ 1:30.  Are transportation arrangements needed? No  If their condition worsens, is the pt aware to call PCP or go to the Emergency Dept.? Yes Was the patient provided with contact information for the PCP's office or ED? Yes Was to pt encouraged to call back with questions or concerns? Yes

## 2021-11-20 NOTE — Telephone Encounter (Signed)
Transition Care Management Unsuccessful Follow-up Telephone Call  Date of discharge and from where:  11/16/21 [facility]  Diagnosis: UTI/Sepsis   Attempts:  1st Attempt  Reason for unsuccessful TCM follow-up call:  Unable to leave message

## 2021-11-20 NOTE — Telephone Encounter (Signed)
Patient wife called- update for Dr. Alen Blew.   Patient went to [facility] ED Friday evening following direction from after hours triage service.  He had generalized weakness with a temperature as high as 102F for 1 day with symptoms of UTI. She said it was just like symptoms in past October when patient was hospitalized, so they knew to call and go to ED when directed.   He was admitted for overnight - given IV antibiotics - and d/c'd Saturday with Rx for antibiotic. She stated by d/c on Saturday, his fever was gone and his urinary symptoms were almost gone.  He is  continuing PO antibiotic.  Ms. Nickolson stated Dr. Alen Blew had advised them that patient might have recurring urinary infections with his diagnosis.   She wants to know if Dr. Alen Blew would prescribe an antibiotic for patient to have on hand - or at pharmacy - if he has same symptoms again so that he can start treatment sooner and avoid hospital/ED?   Advised her that Dr. Alen Blew will receive message/question. Call routed to MD

## 2021-11-21 ENCOUNTER — Telehealth: Payer: Self-pay | Admitting: *Deleted

## 2021-11-21 LAB — CULTURE, BLOOD (ROUTINE X 2)
Culture: NO GROWTH
Culture: NO GROWTH
Special Requests: ADEQUATE

## 2021-11-21 NOTE — Telephone Encounter (Signed)
PC to patient's wife, Vergia Alcon, informed her PA has been obtained for patient's CT & it may be scheduled at any time.  Central Scheduling phone number given, [phone].  She verbalizes understanding.

## 2021-12-05 ENCOUNTER — Telehealth: Payer: Self-pay

## 2021-12-05 NOTE — Telephone Encounter (Signed)
Notified Patient's Spouse, Barnett Applebaum, of completion of forms for [employer]. Copy of Forms placed at Registration Desk for pick-up as requested.

## 2021-12-13 ENCOUNTER — Other Ambulatory Visit: Payer: Self-pay

## 2021-12-13 ENCOUNTER — Ambulatory Visit (HOSPITAL_COMMUNITY)
Admission: RE | Admit: 2021-12-13 | Discharge: 2021-12-13 | Disposition: A | Discharge status: Home / Self Care | Payer: BC Managed Care – PPO | Source: Ambulatory Visit | Attending: Oncology | Admitting: Oncology

## 2021-12-13 DIAGNOSIS — C670 Malignant neoplasm of trigone of bladder: Secondary | ICD-10-CM | POA: Diagnosis present

## 2021-12-13 LAB — POCT I-STAT CREATININE: Creatinine, Ser: 1.8 mg/dL — ABNORMAL HIGH (ref 0.61–1.24)

## 2021-12-13 MED ORDER — IOHEXOL 300 MG/ML  SOLN
100.0000 mL | Freq: Once | INTRAMUSCULAR | Status: AC | PRN
  Administered 2021-12-13: 80 mL via INTRAVENOUS

## 2021-12-15 ENCOUNTER — Other Ambulatory Visit: Payer: Self-pay

## 2021-12-15 ENCOUNTER — Emergency Department (HOSPITAL_COMMUNITY)
Admission: EM | Admit: 2021-12-15 | Discharge: 2021-12-16 | Disposition: A | Discharge status: Home / Self Care | Payer: BC Managed Care – PPO | Attending: Emergency Medicine | Admitting: Emergency Medicine

## 2021-12-15 ENCOUNTER — Encounter (HOSPITAL_COMMUNITY): Payer: Self-pay

## 2021-12-15 DIAGNOSIS — Z8551 Personal history of malignant neoplasm of bladder: Secondary | ICD-10-CM | POA: Diagnosis not present

## 2021-12-15 DIAGNOSIS — I10 Essential (primary) hypertension: Secondary | ICD-10-CM | POA: Diagnosis not present

## 2021-12-15 DIAGNOSIS — Z79899 Other long term (current) drug therapy: Secondary | ICD-10-CM | POA: Diagnosis not present

## 2021-12-15 DIAGNOSIS — Z9889 Other specified postprocedural states: Secondary | ICD-10-CM

## 2021-12-15 DIAGNOSIS — J101 Influenza due to other identified influenza virus with other respiratory manifestations: Secondary | ICD-10-CM

## 2021-12-15 DIAGNOSIS — N390 Urinary tract infection, site not specified: Secondary | ICD-10-CM

## 2021-12-15 DIAGNOSIS — Z20822 Contact with and (suspected) exposure to covid-19: Secondary | ICD-10-CM | POA: Insufficient documentation

## 2021-12-15 DIAGNOSIS — R5383 Other fatigue: Secondary | ICD-10-CM | POA: Diagnosis present

## 2021-12-15 LAB — COMPREHENSIVE METABOLIC PANEL
ALT: 14 U/L (ref 0–44)
AST: 14 U/L — ABNORMAL LOW (ref 15–41)
Albumin: 3.7 g/dL (ref 3.5–5.0)
Alkaline Phosphatase: 64 U/L (ref 38–126)
Anion gap: 8 (ref 5–15)
BUN: 22 mg/dL — ABNORMAL HIGH (ref 6–20)
CO2: 23 mmol/L (ref 22–32)
Calcium: 8.6 mg/dL — ABNORMAL LOW (ref 8.9–10.3)
Chloride: 101 mmol/L (ref 98–111)
Creatinine, Ser: 1.46 mg/dL — ABNORMAL HIGH (ref 0.61–1.24)
GFR, Estimated: 55 mL/min — ABNORMAL LOW (ref >60)
Glucose, Bld: 105 mg/dL — ABNORMAL HIGH (ref 70–99)
Potassium: 3.6 mmol/L (ref 3.5–5.1)
Sodium: 132 mmol/L — ABNORMAL LOW (ref 135–145)
Total Bilirubin: 1 mg/dL (ref 0.3–1.2)
Total Protein: 7.4 g/dL (ref 6.5–8.1)

## 2021-12-15 LAB — URINALYSIS, MICROSCOPIC (REFLEX)

## 2021-12-15 LAB — RESP PANEL BY RT-PCR (FLU A&B, COVID) ARPGX2
Influenza A by PCR: POSITIVE — AB
Influenza B by PCR: NEGATIVE
SARS Coronavirus 2 by RT PCR: NEGATIVE

## 2021-12-15 LAB — URINALYSIS, ROUTINE W REFLEX MICROSCOPIC
Bilirubin Urine: NEGATIVE
Glucose, UA: NEGATIVE mg/dL
Ketones, ur: NEGATIVE mg/dL
Nitrite: POSITIVE — AB
Specific Gravity, Urine: 1.015 (ref 1.005–1.030)
pH: 6 (ref 5.0–8.0)

## 2021-12-15 LAB — CBC WITH DIFFERENTIAL/PLATELET
Abs Immature Granulocytes: 0.05 10*3/uL (ref 0.00–0.07)
Basophils Absolute: 0.1 10*3/uL (ref 0.0–0.1)
Basophils Relative: 1 %
Eosinophils Absolute: 0.1 10*3/uL (ref 0.0–0.5)
Eosinophils Relative: 0 %
HCT: 41.3 % (ref 39.0–52.0)
Hemoglobin: 13.6 g/dL (ref 13.0–17.0)
Immature Granulocytes: 0 %
Lymphocytes Relative: 10 %
Lymphs Abs: 1.4 10*3/uL (ref 0.7–4.0)
MCH: 30.2 pg (ref 26.0–34.0)
MCHC: 32.9 g/dL (ref 30.0–36.0)
MCV: 91.8 fL (ref 80.0–100.0)
Monocytes Absolute: 1.2 10*3/uL — ABNORMAL HIGH (ref 0.1–1.0)
Monocytes Relative: 9 %
Neutro Abs: 10.6 10*3/uL — ABNORMAL HIGH (ref 1.7–7.7)
Neutrophils Relative %: 80 %
Platelets: 170 10*3/uL (ref 150–400)
RBC: 4.5 MIL/uL (ref 4.22–5.81)
RDW: 14.4 % (ref 11.5–15.5)
WBC: 13.4 10*3/uL — ABNORMAL HIGH (ref 4.0–10.5)
nRBC: 0 % (ref 0.0–0.2)

## 2021-12-15 LAB — LACTIC ACID, PLASMA: Lactic Acid, Venous: 0.7 mmol/L (ref 0.5–1.9)

## 2021-12-15 NOTE — ED Triage Notes (Addendum)
Pt presents with fatigue, nausea, fever of 102 x1 hour ago. Pt diagnosed with UTI last month and was admitted and given antibiotics. Pt states that this feels the like the same. Afebrile in triage. Denies any urinary symptoms.   Pt hx bladder cancer and had bladder removed x1 year ago.

## 2021-12-16 LAB — LACTIC ACID, PLASMA: Lactic Acid, Venous: 0.6 mmol/L (ref 0.5–1.9)

## 2021-12-16 MED ORDER — CEPHALEXIN 500 MG PO CAPS: 500.0000 mg | ORAL_CAPSULE | Freq: Four times a day (QID) | ORAL | 0 refills | Status: DC

## 2021-12-16 MED ORDER — CEFTRIAXONE SODIUM 1 G IJ SOLR
1.0000 g | Freq: Once | INTRAMUSCULAR | Status: AC
  Administered 2021-12-16: 1 g via INTRAVENOUS
  Filled 2021-12-16: qty 10

## 2021-12-16 MED ORDER — OSELTAMIVIR PHOSPHATE 75 MG PO CAPS
75.0000 mg | ORAL_CAPSULE | Freq: Once | ORAL | Status: DC
  Filled 2021-12-16: qty 1

## 2021-12-16 MED ORDER — OSELTAMIVIR PHOSPHATE 75 MG PO CAPS: 75.0000 mg | ORAL_CAPSULE | Freq: Two times a day (BID) | ORAL | 0 refills | Status: DC

## 2021-12-16 NOTE — ED Provider Notes (Signed)
[facility] EMERGENCY DEPARTMENT Provider Note   CSN: [id number] Arrival date & time: 12/15/21  1846     History  Chief Complaint  Patient presents with   Fatigue    Barry Gibson is a 61 y.o. male.  Patient is a 61 year old male with past medical history of urinary bladder cancer status post bladder removal with urostomy, hypertension, hyperlipidemia.  Patient presenting today for evaluation of body aches and chills.  This started approximately 24 hours prior to presentation.  He was admitted 1 month ago for a urinary tract infection and this feels similar.  He denies to me he is having any cough, shortness of breath, abdominal pain.  He denies any change in the character of the urine in the urostomy bag.  The history is provided by the patient.      Home Medications Prior to Admission medications   Medication Sig Start Date End Date Taking? Authorizing Provider  acetaminophen (TYLENOL) 650 MG CR tablet Take 1,300 mg by mouth every morning.    [provider]  amLODipine (NORVASC) 10 MG tablet Take 1 tablet (10 mg total) by mouth daily. 05/07/21   Lovena Le, Malena M, DO  diphenoxylate-atropine (LOMOTIL) 2.5-0.025 MG tablet Take 1 tablet by mouth 4 (four) times daily as needed for diarrhea or loose stools. 10/15/21   Wyatt Portela, MD  feeding supplement (ENSURE ENLIVE / ENSURE PLUS) LIQD Take 237 mLs by mouth 2 (two) times daily between meals. 09/18/21   Debbe Odea, MD  lidocaine-prilocaine (EMLA) cream Apply 1 application topically as needed. 05/18/21   Wyatt Portela, MD  Multiple Vitamin (MULTIVITAMIN WITH MINERALS) TABS tablet Take 1 tablet by mouth daily. 09/18/21   Debbe Odea, MD  omeprazole (PRILOSEC) 40 MG capsule Take 40 mg by mouth daily.    [provider]  prednisoLONE acetate (PRED FORTE) 1 % ophthalmic suspension Place 1 drop into the left eye 2 (two) times a week. Instill 1 drop into left eye Monday and Friday.    [provider]   prochlorperazine (COMPAZINE) 10 MG tablet TAKE 1 TABLET(10 MG) BY MOUTH EVERY 6 HOURS AS NEEDED FOR NAUSEA OR VOMITING 07/24/21   Wyatt Portela, MD  rosuvastatin (CRESTOR) 20 MG tablet Take 1 tablet (20 mg total) by mouth daily. 09/21/21   Kathyrn Drown, MD  sertraline (ZOLOFT) 50 MG tablet TAKE 1 TABLET(50 MG) BY MOUTH DAILY 08/06/21   Elvia Collum M, DO      Allergies    Ace inhibitors and Diflucan [fluconazole]    Review of Systems   Review of Systems  All other systems reviewed and are negative.  Physical Exam Updated Vital Signs BP 125/71    Pulse 92    Temp 100.2 F (37.9 C) (Oral)    Resp (!) 23    Ht 5\' 11"  (1.803 m)    Wt 90.7 kg    SpO2 93%    BMI 27.89 kg/m  Physical Exam Vitals and nursing note reviewed.  Constitutional:      General: He is not in acute distress.    Appearance: He is well-developed. He is not diaphoretic.  HENT:     Head: Normocephalic and atraumatic.  Cardiovascular:     Rate and Rhythm: Normal rate and regular rhythm.     Heart sounds: No murmur heard.   No friction rub.  Pulmonary:     Effort: Pulmonary effort is normal. No respiratory distress.     Breath sounds: Normal breath sounds.  No wheezing or rales.  Abdominal:     General: Bowel sounds are normal. There is no distension.     Palpations: Abdomen is soft.     Tenderness: There is no abdominal tenderness.     Comments: Clear yellow urine is noted in the urostomy bag  Musculoskeletal:        General: Normal range of motion.     Cervical back: Normal range of motion and neck supple.  Skin:    General: Skin is warm and dry.  Neurological:     Mental Status: He is alert and oriented to person, place, and time.     Coordination: Coordination normal.    ED Results / Procedures / Treatments   Labs (all labs ordered are listed, but only abnormal results are displayed) Labs Reviewed  RESP PANEL BY RT-PCR (FLU A&B, COVID) ARPGX2 - Abnormal; Notable for the following components:       Result Value   Influenza A by PCR POSITIVE (*)    All other components within normal limits  URINALYSIS, ROUTINE W REFLEX MICROSCOPIC - Abnormal; Notable for the following components:   Hgb urine dipstick SMALL (*)    Protein, ur TRACE (*)    Nitrite POSITIVE (*)    Leukocytes,Ua MODERATE (*)    All other components within normal limits  COMPREHENSIVE METABOLIC PANEL - Abnormal; Notable for the following components:   Sodium 132 (*)    Glucose, Bld 105 (*)    BUN 22 (*)    Creatinine, Ser 1.46 (*)    Calcium 8.6 (*)    AST 14 (*)    GFR, Estimated 55 (*)    All other components within normal limits  CBC WITH DIFFERENTIAL/PLATELET - Abnormal; Notable for the following components:   WBC 13.4 (*)    Neutro Abs 10.6 (*)    Monocytes Absolute 1.2 (*)    All other components within normal limits  URINALYSIS, MICROSCOPIC (REFLEX) - Abnormal; Notable for the following components:   Bacteria, UA MANY (*)    All other components within normal limits  LACTIC ACID, PLASMA  LACTIC ACID, PLASMA    EKG None  Radiology No results found.  Procedures Procedures    Medications Ordered in ED Medications  cefTRIAXone (ROCEPHIN) 1 g in sodium chloride 0.9 % 100 mL IVPB (has no administration in time range)    ED Course/ Medical Decision Making/ A&P  This patient presents to the ED for concern of weakness and fatigue, this involves an extensive number of treatment options, and is a complaint that carries with it a high risk of complications and morbidity.  The differential diagnosis includes UTI, viral syndrome   Co morbidities that complicate the patient evaluation  Urostomy tube   Additional history obtained:  Additional history obtained from wife at bedside No external records necessary   Lab Tests:  I Ordered, and personally interpreted labs.  The pertinent results include: Basically unremarkable laboratory studies.  He does have a mild leukocytosis, but no leftward  shift   Imaging Studies ordered:  No imaging studies ordered   Cardiac Monitoring:  No cardiac monitoring   Medicines ordered and prescription drug management:  I ordered medication including Rocephin for UTI and Tamiflu for influenza Reevaluation of the patient after these medicines showed that the patient improved I have reviewed the patients home medicines and have made adjustments as needed   Test Considered:  No other test considered or ordered   Critical Interventions:  Intravenous antibiotics  Consultations Obtained:  No consultations made or indicated   Problem List / ED Course:  Patient with history of bladder cancer status post urostomy tube presenting with complaints of fever.  He was admitted last month for UTI and felt the same way at that time.  Urine is suggestive of infection versus colonization, but his influenza A test was also positive.  I favor influenza as the most likely cause of his symptoms, but will treat with Rocephin and Keflex as well as Tamiflu.  He is to return as needed.   Reevaluation:  After the interventions noted above, I reevaluated the patient and found that they have :improved   Social Determinants of Health:  None   Dispostion:  After consideration of the diagnostic results and the patients response to treatment, I feel that the patent would benefit from discharge home with antibiotics and Tamiflu.    Final Clinical Impression(s) / ED Diagnoses Final diagnoses:  None    Rx / DC Orders ED Discharge Orders     None         Veryl Speak, MD 12/16/21 [phone]

## 2021-12-16 NOTE — Discharge Instructions (Signed)
Begin taking Keflex and Tamiflu as prescribed.  Drink plenty of fluids and get plenty of rest.  Return to the emergency department if symptoms significantly worsen or change.

## 2021-12-21 ENCOUNTER — Other Ambulatory Visit: Payer: BC Managed Care – PPO

## 2021-12-21 ENCOUNTER — Inpatient Hospital Stay: Payer: BC Managed Care – PPO | Attending: Oncology

## 2021-12-21 ENCOUNTER — Ambulatory Visit: Payer: BC Managed Care – PPO

## 2021-12-21 ENCOUNTER — Inpatient Hospital Stay: Payer: BC Managed Care – PPO

## 2021-12-21 ENCOUNTER — Other Ambulatory Visit: Payer: Self-pay

## 2021-12-21 ENCOUNTER — Ambulatory Visit: Payer: BC Managed Care – PPO | Admitting: Oncology

## 2021-12-21 ENCOUNTER — Inpatient Hospital Stay (HOSPITAL_BASED_OUTPATIENT_CLINIC_OR_DEPARTMENT_OTHER): Payer: BC Managed Care – PPO | Admitting: Oncology

## 2021-12-21 VITALS — BP 119/74 | HR 80 | Temp 97.0°F | Resp 19 | Ht 71.0 in | Wt 207.8 lb

## 2021-12-21 DIAGNOSIS — N390 Urinary tract infection, site not specified: Secondary | ICD-10-CM | POA: Diagnosis not present

## 2021-12-21 DIAGNOSIS — Z87440 Personal history of urinary (tract) infections: Secondary | ICD-10-CM | POA: Diagnosis not present

## 2021-12-21 DIAGNOSIS — Z883 Allergy status to other anti-infective agents status: Secondary | ICD-10-CM | POA: Insufficient documentation

## 2021-12-21 DIAGNOSIS — C670 Malignant neoplasm of trigone of bladder: Secondary | ICD-10-CM | POA: Diagnosis not present

## 2021-12-21 DIAGNOSIS — Z95828 Presence of other vascular implants and grafts: Secondary | ICD-10-CM

## 2021-12-21 DIAGNOSIS — R590 Localized enlarged lymph nodes: Secondary | ICD-10-CM | POA: Diagnosis not present

## 2021-12-21 DIAGNOSIS — Z79899 Other long term (current) drug therapy: Secondary | ICD-10-CM | POA: Diagnosis not present

## 2021-12-21 DIAGNOSIS — C679 Malignant neoplasm of bladder, unspecified: Secondary | ICD-10-CM

## 2021-12-21 DIAGNOSIS — Z5112 Encounter for antineoplastic immunotherapy: Secondary | ICD-10-CM | POA: Insufficient documentation

## 2021-12-21 LAB — CMP (CANCER CENTER ONLY)
ALT: 12 U/L (ref 0–44)
AST: 10 U/L — ABNORMAL LOW (ref 15–41)
Albumin: 3.7 g/dL (ref 3.5–5.0)
Alkaline Phosphatase: 71 U/L (ref 38–126)
Anion gap: 7 (ref 5–15)
BUN: 21 mg/dL — ABNORMAL HIGH (ref 6–20)
CO2: 25 mmol/L (ref 22–32)
Calcium: 9 mg/dL (ref 8.9–10.3)
Chloride: 108 mmol/L (ref 98–111)
Creatinine: 1.37 mg/dL — ABNORMAL HIGH (ref 0.61–1.24)
GFR, Estimated: 59 mL/min — ABNORMAL LOW (ref >60)
Glucose, Bld: 94 mg/dL (ref 70–99)
Potassium: 3.8 mmol/L (ref 3.5–5.1)
Sodium: 140 mmol/L (ref 135–145)
Total Bilirubin: 0.3 mg/dL (ref 0.3–1.2)
Total Protein: 7.2 g/dL (ref 6.5–8.1)

## 2021-12-21 LAB — CBC WITH DIFFERENTIAL (CANCER CENTER ONLY)
Abs Immature Granulocytes: 0.03 10*3/uL (ref 0.00–0.07)
Basophils Absolute: 0.1 10*3/uL (ref 0.0–0.1)
Basophils Relative: 1 %
Eosinophils Absolute: 0.2 10*3/uL (ref 0.0–0.5)
Eosinophils Relative: 2 %
HCT: 35.6 % — ABNORMAL LOW (ref 39.0–52.0)
Hemoglobin: 11.8 g/dL — ABNORMAL LOW (ref 13.0–17.0)
Immature Granulocytes: 0 %
Lymphocytes Relative: 14 %
Lymphs Abs: 1.4 10*3/uL (ref 0.7–4.0)
MCH: 29.4 pg (ref 26.0–34.0)
MCHC: 33.1 g/dL (ref 30.0–36.0)
MCV: 88.8 fL (ref 80.0–100.0)
Monocytes Absolute: 0.7 10*3/uL (ref 0.1–1.0)
Monocytes Relative: 7 %
Neutro Abs: 7.6 10*3/uL (ref 1.7–7.7)
Neutrophils Relative %: 76 %
Platelet Count: 252 10*3/uL (ref 150–400)
RBC: 4.01 MIL/uL — ABNORMAL LOW (ref 4.22–5.81)
RDW: 14 % (ref 11.5–15.5)
WBC Count: 10 10*3/uL (ref 4.0–10.5)
nRBC: 0 % (ref 0.0–0.2)

## 2021-12-21 LAB — TSH: TSH: 1.118 u[IU]/mL (ref 0.320–4.118)

## 2021-12-21 MED ORDER — SODIUM CHLORIDE 0.9% FLUSH
10.0000 mL | Freq: Once | INTRAVENOUS | Status: AC
  Administered 2021-12-21: 10 mL

## 2021-12-21 MED ORDER — SODIUM CHLORIDE 0.9 % IV SOLN
400.0000 mg | Freq: Once | INTRAVENOUS | Status: AC
  Administered 2021-12-21: 400 mg via INTRAVENOUS
  Filled 2021-12-21: qty 16

## 2021-12-21 MED ORDER — CEPHALEXIN 500 MG PO CAPS: 500.0000 mg | ORAL_CAPSULE | Freq: Four times a day (QID) | ORAL | 1 refills | Status: DC

## 2021-12-21 MED ORDER — SODIUM CHLORIDE 0.9 % IV SOLN: Freq: Once | INTRAVENOUS | Status: DC

## 2021-12-21 NOTE — Progress Notes (Signed)
Hematology and Oncology Follow Up Visit  Barry Gibson [id number] [birth year] 61 y.o. 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   Prior Therapy:   He underwent robotic assisted laparoscopic radical cystectomy and lymphadenectomy January 2022.  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  He had 4 out of 7 lymph nodes involved with the final pathological staging is T4N2 disease.     He underwent staging scans on April 19, 2021 which showed interval progression of abdominal pelvic adenopathy consistent with worsening nodal metastasis.    Chemotherapy utilizing gemcitabine and cisplatin started on June 08, 2021 receiving day 1 cisplatin and gemcitabine with day 8 gemcitabine alone.  He completed cycle 4 on August 17, 2021.   Current therapy: Pembrolizumab 200 mg every 3 weeks started on September 07, 2021.  He is currently receiving 400 mg every 6 weeks.  He is here for the next cycle of therapy.    Interim History: [name] presents today for a follow-up visit.  Since her last visit, he reports feeling well without any major complaints although he was diagnosed with a urinary tract infection on 2 separate occasions and was seen emergency department on December 15, 2021.  Urine analysis confirmed the presence of a UTI and was prescribed cephalexin and his symptoms has resolved at this time.  He has reports of fatigue and tiredness without any fever or dysuria.  His performance status quality of life remained excellent at this time.     Medications: Updated on review. Current Outpatient Medications  Medication Sig Dispense Refill   acetaminophen (TYLENOL) 650 MG CR tablet Take 1,300 mg  by mouth every morning.     amLODipine (NORVASC) 10 MG tablet Take 1 tablet (10 mg total) by mouth daily. 90 tablet 1   cephALEXin (KEFLEX) 500 MG capsule Take 1 capsule (500 mg total) by mouth 4 (four) times daily. 28 capsule 0   diphenoxylate-atropine (LOMOTIL) 2.5-0.025 MG tablet Take 1 tablet by mouth 4 (four) times daily as needed for diarrhea or loose stools. 30 tablet 0   feeding supplement (ENSURE ENLIVE / ENSURE PLUS) LIQD Take 237 mLs by mouth 2 (two) times daily between meals. 237 mL 12   lidocaine-prilocaine (EMLA) cream Apply 1 application topically as needed. 30 g 0   Multiple Vitamin (MULTIVITAMIN WITH MINERALS) TABS tablet Take 1 tablet by mouth daily.     omeprazole (PRILOSEC) 40 MG capsule Take 40 mg by mouth daily.     oseltamivir (TAMIFLU) 75 MG capsule Take 1 capsule (75 mg total) by mouth every 12 (twelve) hours. 10 capsule 0   prednisoLONE acetate (PRED FORTE) 1 % ophthalmic suspension Place 1 drop into the left eye 2 (two) times a week. Instill 1 drop into left eye Monday and Friday.     prochlorperazine (COMPAZINE) 10 MG tablet TAKE 1 TABLET(10 MG) BY MOUTH EVERY 6 HOURS AS NEEDED FOR NAUSEA OR VOMITING 90 tablet 2   rosuvastatin (CRESTOR) 20 MG tablet Take 1 tablet (20 mg total) by mouth daily. 90 tablet 2   sertraline (ZOLOFT) 50 MG tablet TAKE 1 TABLET(50 MG) BY MOUTH DAILY 90 tablet 0   No current facility-administered medications for this visit.   Facility-Administered Medications Ordered in Other Visits  Medication Dose Route Frequency Provider Last Rate Last Admin   acetaminophen (TYLENOL) 325 MG tablet            diphenhydrAMINE (BENADRYL) 25 mg capsule            gemcitabine (GEMZAR) chemo syringe for bladder instillation 2,000 mg  2,000 mg Bladder Instillation Once Franchot Gallo, MD       gemcitabine [facility]) chemo syringe for bladder instillation 2,000 mg  2,000 mg Bladder Instillation Once Franchot Gallo, MD         Allergies:  Allergies   Allergen Reactions   Ace Inhibitors Cough   Diflucan [Fluconazole] Other (See Comments)    Irritated ulcers      Physical Exam:   Blood pressure 119/74, pulse 80, temperature (!) 97 F (36.1 C), temperature source Tympanic, resp. rate 19, height 5\' 11"  (1.803 m), weight 207 lb 12.8 oz (94.3 kg), SpO2 99 %.     ECOG: 0    General appearance: Comfortable appearing without any discomfort Head: Normocephalic without any trauma Oropharynx: Mucous membranes are moist and pink without any thrush or ulcers. Eyes: Pupils are equal and round reactive to light. Lymph nodes: No cervical, supraclavicular, inguinal or axillary lymphadenopathy.   Heart:regular rate and rhythm.  S1 and S2 without leg edema. Lung: Clear without any rhonchi or wheezes.  No dullness to percussion. Abdomin: Soft, nontender, nondistended with good bowel sounds.  No hepatosplenomegaly. Musculoskeletal: No joint deformity or effusion.  Full range of motion noted. Neurological: No deficits noted on motor, sensory and deep tendon reflex exam. Skin: No petechial rash or dryness.  Appeared moist.           Lab Results: Lab Results  Component Value Date   WBC 13.4 (H) 12/15/2021   HGB 13.6 12/15/2021   HCT 41.3 12/15/2021   MCV 91.8 12/15/2021   PLT 170 12/15/2021     Chemistry      Component Value Date/Time   NA 132 (L) 12/15/2021 2144   NA 141 11/02/2021 1512   K 3.6 12/15/2021 2144   CL 101 12/15/2021 2144   CO2 23 12/15/2021 2144   BUN 22 (H) 12/15/2021 2144   BUN 23 11/02/2021 1512   CREATININE 1.46 (H) 12/15/2021 2144   CREATININE 1.39 (H) 11/09/2021 0811   CREATININE 1.05 11/25/2014 0826      Component Value Date/Time   CALCIUM 8.6 (L) 12/15/2021 2144   ALKPHOS 64 12/15/2021 2144   AST 14 (L) 12/15/2021 2144   AST 15 11/09/2021 0811   ALT 14 12/15/2021 2144   ALT 16 11/09/2021 0811   BILITOT 1.0 12/15/2021 2144   BILITOT 0.2 (L) 11/09/2021 0811         Impression and  Plan:  61 year old man with:  1.  Stage IV high-grade urothelial carcinoma of the bladder with pelvic adenopathy diagnosed in January 2022.  CT scan obtained on December 13, 2021 was personally reviewed and discussed with the patient today.  Overall there is no widespread disease or cancer progression.  He does have an enlarged external iliac lymph node measuring 12 mm which is increased from 3 mm.  This could represent minimal disease progression versus reactive lymph node.  Treatment options moving forward include local radiation therapy, switching systemic therapy or continued observation.  After discussion today, we opted to continue with active surveillance and repeat imaging studies in 3 months.   2.  IV access: Port-A-Cath continues to be in use without any issues.  3.  Antiemetics: Compazine is available to him without any nausea or vomiting at this time.   4.  Renal function surveillance: His kidney function remained stable with creatinine clearance close to 60 cc/min.   5.  Goals of care: Aggressive measures are warranted given his reasonable performance status.  6.  Immune mediated complications: I continue to educate him about potential complication clinic pneumonitis, colitis and thyroid disease.  7.  Recurrent urinary tract infection: Likely related to surgery and urostomy.  I have asked him to follow-up with urology regarding this issue.    8.  Follow-up: In 6 weeks for repeat follow-up.   30  minutes were dedicated to this visit.  The time was spent on reviewing laboratory data, disease status update and outlining future plan of care discussion.   Zola Button, MD 2/3/20231:00 PM

## 2021-12-21 NOTE — Patient Instructions (Signed)
[facility] MEDICAL ONCOLOGY  Discharge Instructions: ?Thank you for choosing [facility] to provide your oncology and hematology care.  ? ?If you have a lab appointment with the Cancer Center, please go directly to the Cancer Center and check in at the registration area. ?  ?Wear comfortable clothing and clothing appropriate for easy access to any Portacath or PICC line.  ? ?We strive to give you quality time with your provider. You may need to reschedule your appointment if you arrive late (15 or more minutes).  Arriving late affects you and other patients whose appointments are after yours.  Also, if you miss three or more appointments without notifying the office, you may be dismissed from the clinic at the provider?s discretion.    ?  ?For prescription refill requests, have your pharmacy contact our office and allow 72 hours for refills to be completed.   ? ?Today you received the following chemotherapy and/or immunotherapy agents: Keytruda ?  ?To help prevent nausea and vomiting after your treatment, we encourage you to take your nausea medication as directed. ? ?BELOW ARE SYMPTOMS THAT SHOULD BE REPORTED IMMEDIATELY: ?*FEVER GREATER THAN 100.4 F (38 ?C) OR HIGHER ?*CHILLS OR SWEATING ?*NAUSEA AND VOMITING THAT IS NOT CONTROLLED WITH YOUR NAUSEA MEDICATION ?*UNUSUAL SHORTNESS OF BREATH ?*UNUSUAL BRUISING OR BLEEDING ?*URINARY PROBLEMS (pain or burning when urinating, or frequent urination) ?*BOWEL PROBLEMS (unusual diarrhea, constipation, pain near the anus) ?TENDERNESS IN MOUTH AND THROAT WITH OR WITHOUT PRESENCE OF ULCERS (sore throat, sores in mouth, or a toothache) ?UNUSUAL RASH, SWELLING OR PAIN  ?UNUSUAL VAGINAL DISCHARGE OR ITCHING  ? ?Items with * indicate a potential emergency and should be followed up as soon as possible or go to the Emergency Department if any problems should occur. ? ?Please show the CHEMOTHERAPY ALERT CARD or IMMUNOTHERAPY ALERT CARD at check-in to the  Emergency Department and triage nurse. ? ?Should you have questions after your visit or need to cancel or reschedule your appointment, please contact [facility] MEDICAL ONCOLOGY  Dept: [phone]  and follow the prompts.  Office hours are 8:00 a.m. to 4:30 p.m. Monday - Friday. Please note that voicemails left after 4:00 p.m. may not be returned until the following business day.  We are closed weekends and major holidays. You have access to a nurse at all times for urgent questions. Please call the main number to the clinic Dept: [phone] and follow the prompts. ? ? ?For any non-urgent questions, you may also contact your provider using MyChart. We now offer e-Visits for anyone 18 and older to request care online for non-urgent symptoms. For details visit [email or web address]. ?  ?Also download the MyChart app! Go to the app store, search "MyChart", open the app, select [facility], and log in with your MyChart username and password. ? ?Due to Covid, a mask is required upon entering the hospital/clinic. If you do not have a mask, one will be given to you upon arrival. For doctor visits, patients may have 1 support person aged 18 or older with them. For treatment visits, patients cannot have anyone with them due to current Covid guidelines and our immunocompromised population.  ? ?

## 2021-12-26 ENCOUNTER — Other Ambulatory Visit: Payer: Self-pay | Admitting: Family Medicine

## 2021-12-28 ENCOUNTER — Telehealth: Payer: Self-pay | Admitting: Family Medicine

## 2021-12-28 ENCOUNTER — Other Ambulatory Visit: Payer: Self-pay | Admitting: Family Medicine

## 2021-12-28 MED ORDER — NIRMATRELVIR/RITONAVIR (PAXLOVID) TABLET (RENAL DOSING): 2.0000 | ORAL_TABLET | Freq: Two times a day (BID) | ORAL | 0 refills | Status: AC

## 2021-12-28 NOTE — Telephone Encounter (Signed)
Patient notified

## 2021-12-28 NOTE — Telephone Encounter (Signed)
Received call from patient's spouse to report positive result from COVID test taken last night; requesting medication asap. Sx: scratchy throat, bad cough, sneezing, nasal congestion, runny nose, very fatigued.  Patient just finished chemo in October; currently taking keytruda infusions.   Requesting for Rx to be sent to [facility] in [place] on [address], phone number [phone]  Please advise at [phone].

## 2021-12-28 NOTE — Telephone Encounter (Signed)
Coral Spikes, DO    Sent Rx in for [facility].   Dr. Lacinda Axon

## 2022-01-30 ENCOUNTER — Other Ambulatory Visit: Payer: Self-pay | Admitting: Family Medicine

## 2022-01-30 DIAGNOSIS — F419 Anxiety disorder, unspecified: Secondary | ICD-10-CM

## 2022-01-30 DIAGNOSIS — F32A Depression, unspecified: Secondary | ICD-10-CM

## 2022-02-01 ENCOUNTER — Inpatient Hospital Stay: Payer: BC Managed Care – PPO

## 2022-02-01 ENCOUNTER — Inpatient Hospital Stay: Payer: BC Managed Care – PPO | Attending: Oncology

## 2022-02-01 ENCOUNTER — Other Ambulatory Visit: Payer: Self-pay

## 2022-02-01 ENCOUNTER — Ambulatory Visit: Payer: BC Managed Care – PPO | Admitting: Oncology

## 2022-02-01 ENCOUNTER — Ambulatory Visit: Payer: BC Managed Care – PPO

## 2022-02-01 ENCOUNTER — Other Ambulatory Visit: Payer: BC Managed Care – PPO

## 2022-02-01 ENCOUNTER — Inpatient Hospital Stay (HOSPITAL_BASED_OUTPATIENT_CLINIC_OR_DEPARTMENT_OTHER): Payer: BC Managed Care – PPO | Admitting: Oncology

## 2022-02-01 VITALS — BP 109/74 | HR 73 | Temp 97.7°F | Resp 17 | Ht 71.0 in | Wt 210.7 lb

## 2022-02-01 DIAGNOSIS — C670 Malignant neoplasm of trigone of bladder: Secondary | ICD-10-CM

## 2022-02-01 DIAGNOSIS — Z95828 Presence of other vascular implants and grafts: Secondary | ICD-10-CM

## 2022-02-01 DIAGNOSIS — C778 Secondary and unspecified malignant neoplasm of lymph nodes of multiple regions: Secondary | ICD-10-CM | POA: Insufficient documentation

## 2022-02-01 DIAGNOSIS — Z883 Allergy status to other anti-infective agents status: Secondary | ICD-10-CM | POA: Insufficient documentation

## 2022-02-01 DIAGNOSIS — C679 Malignant neoplasm of bladder, unspecified: Secondary | ICD-10-CM

## 2022-02-01 DIAGNOSIS — Z87440 Personal history of urinary (tract) infections: Secondary | ICD-10-CM | POA: Diagnosis not present

## 2022-02-01 DIAGNOSIS — Z5112 Encounter for antineoplastic immunotherapy: Secondary | ICD-10-CM | POA: Insufficient documentation

## 2022-02-01 DIAGNOSIS — Z79899 Other long term (current) drug therapy: Secondary | ICD-10-CM | POA: Insufficient documentation

## 2022-02-01 LAB — CMP (CANCER CENTER ONLY)
ALT: 15 U/L (ref 0–44)
AST: 14 U/L — ABNORMAL LOW (ref 15–41)
Albumin: 3.8 g/dL (ref 3.5–5.0)
Alkaline Phosphatase: 77 U/L (ref 38–126)
Anion gap: 6 (ref 5–15)
BUN: 26 mg/dL — ABNORMAL HIGH (ref 6–20)
CO2: 26 mmol/L (ref 22–32)
Calcium: 9.2 mg/dL (ref 8.9–10.3)
Chloride: 108 mmol/L (ref 98–111)
Creatinine: 1.46 mg/dL — ABNORMAL HIGH (ref 0.61–1.24)
GFR, Estimated: 55 mL/min — ABNORMAL LOW (ref >60)
Glucose, Bld: 102 mg/dL — ABNORMAL HIGH (ref 70–99)
Potassium: 3.5 mmol/L (ref 3.5–5.1)
Sodium: 140 mmol/L (ref 135–145)
Total Bilirubin: 0.4 mg/dL (ref 0.3–1.2)
Total Protein: 7 g/dL (ref 6.5–8.1)

## 2022-02-01 LAB — CBC WITH DIFFERENTIAL (CANCER CENTER ONLY)
Abs Immature Granulocytes: 0.03 10*3/uL (ref 0.00–0.07)
Basophils Absolute: 0.1 10*3/uL (ref 0.0–0.1)
Basophils Relative: 1 %
Eosinophils Absolute: 0.2 10*3/uL (ref 0.0–0.5)
Eosinophils Relative: 2 %
HCT: 39.8 % (ref 39.0–52.0)
Hemoglobin: 12.6 g/dL — ABNORMAL LOW (ref 13.0–17.0)
Immature Granulocytes: 0 %
Lymphocytes Relative: 17 %
Lymphs Abs: 1.5 10*3/uL (ref 0.7–4.0)
MCH: 28.3 pg (ref 26.0–34.0)
MCHC: 31.7 g/dL (ref 30.0–36.0)
MCV: 89.4 fL (ref 80.0–100.0)
Monocytes Absolute: 0.7 10*3/uL (ref 0.1–1.0)
Monocytes Relative: 8 %
Neutro Abs: 6.6 10*3/uL (ref 1.7–7.7)
Neutrophils Relative %: 72 %
Platelet Count: 211 10*3/uL (ref 150–400)
RBC: 4.45 MIL/uL (ref 4.22–5.81)
RDW: 14.3 % (ref 11.5–15.5)
WBC Count: 9.1 10*3/uL (ref 4.0–10.5)
nRBC: 0 % (ref 0.0–0.2)

## 2022-02-01 LAB — TSH: TSH: 0.147 u[IU]/mL — ABNORMAL LOW (ref 0.320–4.118)

## 2022-02-01 MED ORDER — SODIUM CHLORIDE 0.9 % IV SOLN: Freq: Once | INTRAVENOUS | Status: AC

## 2022-02-01 MED ORDER — SODIUM CHLORIDE 0.9% FLUSH
10.0000 mL | INTRAVENOUS | Status: DC | PRN
  Administered 2022-02-01: 10 mL

## 2022-02-01 MED ORDER — HEPARIN SOD (PORK) LOCK FLUSH 100 UNIT/ML IV SOLN
500.0000 [IU] | Freq: Once | INTRAVENOUS | Status: AC | PRN
  Administered 2022-02-01: 500 [IU]

## 2022-02-01 MED ORDER — SODIUM CHLORIDE 0.9% FLUSH
10.0000 mL | Freq: Once | INTRAVENOUS | Status: AC
  Administered 2022-02-01: 10 mL

## 2022-02-01 MED ORDER — SODIUM CHLORIDE 0.9 % IV SOLN
400.0000 mg | Freq: Once | INTRAVENOUS | Status: AC
  Administered 2022-02-01: 400 mg via INTRAVENOUS
  Filled 2022-02-01: qty 16

## 2022-02-01 NOTE — Patient Instructions (Signed)
[facility] MEDICAL ONCOLOGY  Discharge Instructions: ?Thank you for choosing [facility] to provide your oncology and hematology care.  ? ?If you have a lab appointment with the Cancer Center, please go directly to the Cancer Center and check in at the registration area. ?  ?Wear comfortable clothing and clothing appropriate for easy access to any Portacath or PICC line.  ? ?We strive to give you quality time with your provider. You may need to reschedule your appointment if you arrive late (15 or more minutes).  Arriving late affects you and other patients whose appointments are after yours.  Also, if you miss three or more appointments without notifying the office, you may be dismissed from the clinic at the provider?s discretion.    ?  ?For prescription refill requests, have your pharmacy contact our office and allow 72 hours for refills to be completed.   ? ?Today you received the following chemotherapy and/or immunotherapy agents: Keytruda ?  ?To help prevent nausea and vomiting after your treatment, we encourage you to take your nausea medication as directed. ? ?BELOW ARE SYMPTOMS THAT SHOULD BE REPORTED IMMEDIATELY: ?*FEVER GREATER THAN 100.4 F (38 ?C) OR HIGHER ?*CHILLS OR SWEATING ?*NAUSEA AND VOMITING THAT IS NOT CONTROLLED WITH YOUR NAUSEA MEDICATION ?*UNUSUAL SHORTNESS OF BREATH ?*UNUSUAL BRUISING OR BLEEDING ?*URINARY PROBLEMS (pain or burning when urinating, or frequent urination) ?*BOWEL PROBLEMS (unusual diarrhea, constipation, pain near the anus) ?TENDERNESS IN MOUTH AND THROAT WITH OR WITHOUT PRESENCE OF ULCERS (sore throat, sores in mouth, or a toothache) ?UNUSUAL RASH, SWELLING OR PAIN  ?UNUSUAL VAGINAL DISCHARGE OR ITCHING  ? ?Items with * indicate a potential emergency and should be followed up as soon as possible or go to the Emergency Department if any problems should occur. ? ?Please show the CHEMOTHERAPY ALERT CARD or IMMUNOTHERAPY ALERT CARD at check-in to the  Emergency Department and triage nurse. ? ?Should you have questions after your visit or need to cancel or reschedule your appointment, please contact [facility]ICAL ONCOL[facility]  Dept: [phone]  and follow the prompts.  Office hours are 8:00 a.m. to 4:30 p.m. Monday - Friday. Please note that voicemails left after 4:00 p.m. may not be returned until the following business day.  We are closed weekends and major holidays. You have access to a nurse at all times for urgent questions. Please call the main number to the clinic Dept: [phone] and follow the prompts. ? ? ?For any non-urgent questions, you may also contact your provider using MyChart. We now offer e-Visits for anyone 18 and older to request care online for non-urgent symptoms. For details visit [email or web address]. ?  ?Also download the MyChart app! Go to the app store, search "MyChart", open the app, select [facility], and log in with your MyChart username and password. ? ?Due to Covid, a mask is required upon entering the hospital/clinic. If you do not have a mask, one will be given to you upon arrival. For doctor visits, patients may have 1 support person aged 18 or older with them. For treatment visits, patients cannot have anyone with them due to current Covid guidelines and our immunocompromised population.  ? ?

## 2022-02-01 NOTE — Progress Notes (Signed)
Hematology and Oncology Follow Up Visit ? ?WEBBER MICHIELS ?[id number] ?1961/03/03 61 y.o. ?02/01/2022 1:01 PM ?[name], [name], DO  ? ?Principle Diagnosis: 61 year old man with bladder cancer diagnosed in January 2022.  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 ? ? ?Prior Therapy: ? ? He underwent robotic assisted laparoscopic radical cystectomy and lymphadenectomy January 2022.  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  He had 4 out of 7 lymph nodes involved with the final pathological staging is T4N2 disease.   ? ? ?He underwent staging scans on April 19, 2021 which showed interval progression of abdominal pelvic adenopathy consistent with worsening nodal metastasis.  ? ? ?Chemotherapy utilizing gemcitabine and cisplatin started on June 08, 2021 receiving day 1 cisplatin and gemcitabine with day 8 gemcitabine alone.  He completed cycle 4 on August 17, 2021. ? ? ?Current therapy: Pembrolizumab 200 mg every 3 weeks started on September 07, 2021.  He is currently receiving 400 mg every 6 weeks.  He returns for the next cycle of treatment. ? ? ? ?Interim History: Mr. Terry returns today for repeat evaluation.  Since the last visit, he reports feeling well without any major complaints.  He denies any nausea, vomiting or abdominal pain.  He denies any skin rashes or lesions.  He denies any excessive fatigue or tiredness.  His performance status and quality of life remains unchanged.  He denies any new complaints related to Pembrolizumab.  He denies any recurrent urinary tract infections since last visit. ? ? ? ? ?Medications: Reviewed without changes. ?Current Outpatient Medications  ?Medication Sig Dispense Refill  ? acetaminophen (TYLENOL) 650 MG CR tablet Take 1,300 mg by mouth every morning.    ? amLODipine  (NORVASC) 10 MG tablet TAKE 1 TABLET(10 MG) BY MOUTH DAILY 90 tablet 1  ? cephALEXin (KEFLEX) 500 MG capsule Take 1 capsule (500 mg total) by mouth 4 (four) times daily. 28 capsule 1  ? diphenoxylate-atropine (LOMOTIL) 2.5-0.025 MG tablet Take 1 tablet by mouth 4 (four) times daily as needed for diarrhea or loose stools. 30 tablet 0  ? feeding supplement (ENSURE ENLIVE / ENSURE PLUS) LIQD Take 237 mLs by mouth 2 (two) times daily between meals. 237 mL 12  ? lidocaine-prilocaine (EMLA) cream Apply 1 application topically as needed. 30 g 0  ? Multiple Vitamin (MULTIVITAMIN WITH MINERALS) TABS tablet Take 1 tablet by mouth daily.    ? omeprazole (PRILOSEC) 40 MG capsule Take 40 mg by mouth daily.    ? oseltamivir (TAMIFLU) 75 MG capsule Take 1 capsule (75 mg total) by mouth every 12 (twelve) hours. 10 capsule 0  ? prednisoLONE acetate (PRED FORTE) 1 % ophthalmic suspension Place 1 drop into the left eye 2 (two) times a week. Instill 1 drop into left eye Monday and Friday.    ? prochlorperazine (COMPAZINE) 10 MG tablet TAKE 1 TABLET(10 MG) BY MOUTH EVERY 6 HOURS AS NEEDED FOR NAUSEA OR VOMITING 90 tablet 2  ? rosuvastatin (CRESTOR) 20 MG tablet Take 1 tablet (20 mg total) by mouth daily. 90 tablet 2  ? sertraline (ZOLOFT) 50 MG tablet TAKE 1 TABLET(50 MG) BY MOUTH DAILY 45 tablet 0  ? ?No current facility-administered medications for this visit.  ? ?Facility-Administered Medications Ordered in Other Visits  ?Medication Dose Route Frequency Provider Last Rate Last Admin  ? acetaminophen (TYLENOL)  325 MG tablet           ? diphenhydrAMINE (BENADRYL) 25 mg capsule           ? gemcitabine (GEMZAR) chemo syringe for bladder instillation 2,000 mg  2,000 mg Bladder Instillation Once Franchot Gallo, MD      ? gemcitabine [facility]) chemo syringe for bladder instillation 2,000 mg  2,000 mg Bladder Instillation Once Franchot Gallo, MD      ? ? ? ?Allergies:  ?Allergies  ?Allergen Reactions  ? Ace Inhibitors Cough  ?  Diflucan [Fluconazole] Other (See Comments)  ?  Irritated ulcers  ? ? ? ? ?Physical Exam: ? ? ?Blood pressure 109/74, pulse 73, temperature 97.7 ?F (36.5 ?C), temperature source Axillary, resp. rate 17, height '5\' 11"'$  (1.803 m), weight 210 lb 11.2 oz (95.6 kg), SpO2 100 %. ? ? ? ? ? ?ECOG: 0 ? ? ?General appearance: Alert, awake without any distress. ?Head: Atraumatic without abnormalities ?Oropharynx: Without any thrush or ulcers. ?Eyes: No scleral icterus. ?Lymph nodes: No lymphadenopathy noted in the cervical, supraclavicular, or axillary nodes ?Heart:regular rate and rhythm, without any murmurs or gallops.   ?Lung: Clear to auscultation without any rhonchi, wheezes or dullness to percussion. ?Abdomin: Soft, nontender without any shifting dullness or ascites. ?Musculoskeletal: No clubbing or cyanosis. ?Neurological: No motor or sensory deficits. ?Skin: No rashes or lesions. ? ? ? ? ? ? ? ? ? ? ?Lab Results: ?Lab Results  ?Component Value Date  ? WBC 10.0 12/21/2021  ? HGB 11.8 (L) 12/21/2021  ? HCT 35.6 (L) 12/21/2021  ? MCV 88.8 12/21/2021  ? PLT 252 12/21/2021  ? ?  Chemistry   ?   ?Component Value Date/Time  ? NA 140 12/21/2021 1320  ? NA 141 11/02/2021 1512  ? K 3.8 12/21/2021 1320  ? CL 108 12/21/2021 1320  ? CO2 25 12/21/2021 1320  ? BUN 21 (H) 12/21/2021 1320  ? BUN 23 11/02/2021 1512  ? CREATININE 1.37 (H) 12/21/2021 1320  ? CREATININE 1.05 11/25/2014 0826  ?    ?Component Value Date/Time  ? CALCIUM 9.0 12/21/2021 1320  ? ALKPHOS 71 12/21/2021 1320  ? AST 10 (L) 12/21/2021 1320  ? ALT 12 12/21/2021 1320  ? BILITOT 0.3 12/21/2021 1320  ?  ? ? ? ? ? ?Impression and Plan: ? ?61 year old man with: ? ?1.  Bladder cancer diagnosed in January 2022.  He has stage IV high-grade urothelial carcinoma with pelvic adenopathy.   ? ?He is currently on Pembrolizumab which she has tolerated very well without any major complications.  Risks and benefits of continuing this treatment were reviewed.  Complications that include  immune mediated issues, GI toxicity and dermatological issues were reviewed.  After discussion today he is agreeable to continue and the plan is to update his staging scan before the next visit.  Different salvage therapy options including systemic chemotherapy, antibody drug conjugate oral targeted therapy were reviewed.  These will be considered upon progression.  ?  ?2.  IV access: Port-A-Cath remains in place without any issues. ? ?3.  Antiemetics: No nausea or vomiting at this time with Compazine available to him. ?  ?4.  Renal function surveillance: His creatinine clearance remains stable around 59 cc/min.  We will continue to monitor on subsequent therapies. ?  ?5.  Goals of care: Therapy remains palliative though aggressive measures are warranted given his excellent performance status. ? ?6.  Immune mediated complications: He has not experienced any complications these include pneumonitis, colitis,  thyroid disease among others. ? ?7.  Recurrent urinary tract infection: He has no infections the last 6 weeks. ? ?  ?8.  Follow-up: In 6 weeks for follow-up visit. ?  ?30  minutes were spent on this encounter.  The time was dedicated to reviewing laboratory data, disease status update, and reviewing future plan of care. ? ? ?Zola Button, MD ?3/17/20231:01 PM ? ?

## 2022-02-05 ENCOUNTER — Telehealth: Payer: Self-pay | Admitting: Oncology

## 2022-02-05 NOTE — Telephone Encounter (Signed)
Scheduled per 03/17 los, patient has been called and notified.  

## 2022-02-06 ENCOUNTER — Other Ambulatory Visit: Payer: Self-pay

## 2022-02-06 DIAGNOSIS — F419 Anxiety disorder, unspecified: Secondary | ICD-10-CM

## 2022-02-06 DIAGNOSIS — F32A Depression, unspecified: Secondary | ICD-10-CM

## 2022-02-06 MED ORDER — SERTRALINE HCL 50 MG PO TABS: ORAL_TABLET | ORAL | 0 refills | Status: DC

## 2022-02-12 ENCOUNTER — Ambulatory Visit: Payer: BC Managed Care – PPO

## 2022-02-12 ENCOUNTER — Ambulatory Visit: Payer: BC Managed Care – PPO | Admitting: Oncology

## 2022-02-12 ENCOUNTER — Other Ambulatory Visit: Payer: BC Managed Care – PPO

## 2022-03-13 ENCOUNTER — Ambulatory Visit (HOSPITAL_COMMUNITY)
Admission: RE | Admit: 2022-03-13 | Discharge: 2022-03-13 | Disposition: A | Discharge status: Home / Self Care | Payer: BC Managed Care – PPO | Source: Ambulatory Visit | Attending: Oncology | Admitting: Oncology

## 2022-03-13 DIAGNOSIS — C670 Malignant neoplasm of trigone of bladder: Secondary | ICD-10-CM | POA: Diagnosis present

## 2022-03-13 MED ORDER — IOHEXOL 300 MG/ML  SOLN
100.0000 mL | Freq: Once | INTRAMUSCULAR | Status: AC | PRN
  Administered 2022-03-13: 100 mL via INTRAVENOUS

## 2022-03-13 MED ORDER — SODIUM CHLORIDE (PF) 0.9 % IJ SOLN
INTRAMUSCULAR | Status: AC
  Filled 2022-03-13: qty 300

## 2022-03-15 ENCOUNTER — Ambulatory Visit: Payer: BC Managed Care – PPO

## 2022-03-15 ENCOUNTER — Inpatient Hospital Stay: Payer: BC Managed Care – PPO

## 2022-03-15 ENCOUNTER — Inpatient Hospital Stay: Payer: BC Managed Care – PPO | Attending: Oncology | Admitting: Oncology

## 2022-03-15 ENCOUNTER — Other Ambulatory Visit: Payer: Self-pay

## 2022-03-15 ENCOUNTER — Ambulatory Visit: Payer: BC Managed Care – PPO | Admitting: Oncology

## 2022-03-15 ENCOUNTER — Other Ambulatory Visit: Payer: BC Managed Care – PPO

## 2022-03-15 VITALS — BP 114/77 | HR 75 | Temp 97.8°F | Resp 16 | Wt 214.3 lb

## 2022-03-15 DIAGNOSIS — K5730 Diverticulosis of large intestine without perforation or abscess without bleeding: Secondary | ICD-10-CM | POA: Diagnosis not present

## 2022-03-15 DIAGNOSIS — Z79899 Other long term (current) drug therapy: Secondary | ICD-10-CM | POA: Diagnosis not present

## 2022-03-15 DIAGNOSIS — C679 Malignant neoplasm of bladder, unspecified: Secondary | ICD-10-CM

## 2022-03-15 DIAGNOSIS — I700 Atherosclerosis of aorta: Secondary | ICD-10-CM | POA: Diagnosis not present

## 2022-03-15 DIAGNOSIS — K429 Umbilical hernia without obstruction or gangrene: Secondary | ICD-10-CM | POA: Diagnosis not present

## 2022-03-15 DIAGNOSIS — Z883 Allergy status to other anti-infective agents status: Secondary | ICD-10-CM | POA: Insufficient documentation

## 2022-03-15 DIAGNOSIS — Z5112 Encounter for antineoplastic immunotherapy: Secondary | ICD-10-CM | POA: Insufficient documentation

## 2022-03-15 DIAGNOSIS — K8020 Calculus of gallbladder without cholecystitis without obstruction: Secondary | ICD-10-CM | POA: Insufficient documentation

## 2022-03-15 DIAGNOSIS — I7143 Infrarenal abdominal aortic aneurysm, without rupture: Secondary | ICD-10-CM | POA: Diagnosis not present

## 2022-03-15 DIAGNOSIS — C670 Malignant neoplasm of trigone of bladder: Secondary | ICD-10-CM | POA: Diagnosis present

## 2022-03-15 DIAGNOSIS — Z95828 Presence of other vascular implants and grafts: Secondary | ICD-10-CM

## 2022-03-15 DIAGNOSIS — I2510 Atherosclerotic heart disease of native coronary artery without angina pectoris: Secondary | ICD-10-CM | POA: Insufficient documentation

## 2022-03-15 DIAGNOSIS — R590 Localized enlarged lymph nodes: Secondary | ICD-10-CM | POA: Insufficient documentation

## 2022-03-15 DIAGNOSIS — J439 Emphysema, unspecified: Secondary | ICD-10-CM | POA: Insufficient documentation

## 2022-03-15 DIAGNOSIS — Z87440 Personal history of urinary (tract) infections: Secondary | ICD-10-CM | POA: Diagnosis not present

## 2022-03-15 LAB — CMP (CANCER CENTER ONLY)
ALT: 11 U/L (ref 0–44)
AST: 13 U/L — ABNORMAL LOW (ref 15–41)
Albumin: 3.9 g/dL (ref 3.5–5.0)
Alkaline Phosphatase: 78 U/L (ref 38–126)
Anion gap: 6 (ref 5–15)
BUN: 24 mg/dL — ABNORMAL HIGH (ref 6–20)
CO2: 26 mmol/L (ref 22–32)
Calcium: 9.1 mg/dL (ref 8.9–10.3)
Chloride: 107 mmol/L (ref 98–111)
Creatinine: 1.65 mg/dL — ABNORMAL HIGH (ref 0.61–1.24)
GFR, Estimated: 48 mL/min — ABNORMAL LOW (ref >60)
Glucose, Bld: 96 mg/dL (ref 70–99)
Potassium: 4 mmol/L (ref 3.5–5.1)
Sodium: 139 mmol/L (ref 135–145)
Total Bilirubin: 0.4 mg/dL (ref 0.3–1.2)
Total Protein: 7.2 g/dL (ref 6.5–8.1)

## 2022-03-15 LAB — CBC WITH DIFFERENTIAL (CANCER CENTER ONLY)
Abs Immature Granulocytes: 0.02 10*3/uL (ref 0.00–0.07)
Basophils Absolute: 0.1 10*3/uL (ref 0.0–0.1)
Basophils Relative: 1 %
Eosinophils Absolute: 0.3 10*3/uL (ref 0.0–0.5)
Eosinophils Relative: 3 %
HCT: 41.3 % (ref 39.0–52.0)
Hemoglobin: 13.4 g/dL (ref 13.0–17.0)
Immature Granulocytes: 0 %
Lymphocytes Relative: 19 %
Lymphs Abs: 1.6 10*3/uL (ref 0.7–4.0)
MCH: 28.5 pg (ref 26.0–34.0)
MCHC: 32.4 g/dL (ref 30.0–36.0)
MCV: 87.9 fL (ref 80.0–100.0)
Monocytes Absolute: 0.7 10*3/uL (ref 0.1–1.0)
Monocytes Relative: 8 %
Neutro Abs: 5.7 10*3/uL (ref 1.7–7.7)
Neutrophils Relative %: 69 %
Platelet Count: 198 10*3/uL (ref 150–400)
RBC: 4.7 MIL/uL (ref 4.22–5.81)
RDW: 14.6 % (ref 11.5–15.5)
WBC Count: 8.4 10*3/uL (ref 4.0–10.5)
nRBC: 0 % (ref 0.0–0.2)

## 2022-03-15 LAB — TSH: TSH: 0.76 u[IU]/mL (ref 0.350–4.500)

## 2022-03-15 MED ORDER — SODIUM CHLORIDE 0.9 % IV SOLN: Freq: Once | INTRAVENOUS | Status: AC

## 2022-03-15 MED ORDER — SODIUM CHLORIDE 0.9 % IV SOLN
400.0000 mg | Freq: Once | INTRAVENOUS | Status: AC
  Administered 2022-03-15: 400 mg via INTRAVENOUS
  Filled 2022-03-15: qty 16

## 2022-03-15 MED ORDER — HEPARIN SOD (PORK) LOCK FLUSH 100 UNIT/ML IV SOLN
500.0000 [IU] | Freq: Once | INTRAVENOUS | Status: AC | PRN
  Administered 2022-03-15: 500 [IU]

## 2022-03-15 MED ORDER — SODIUM CHLORIDE 0.9% FLUSH
10.0000 mL | INTRAVENOUS | Status: DC | PRN
  Administered 2022-03-15: 10 mL

## 2022-03-15 MED ORDER — SODIUM CHLORIDE 0.9% FLUSH
10.0000 mL | Freq: Once | INTRAVENOUS | Status: AC
  Administered 2022-03-15: 10 mL

## 2022-03-15 MED ORDER — ALTEPLASE 2 MG IJ SOLR
2.0000 mg | Freq: Once | INTRAMUSCULAR | Status: AC
  Administered 2022-03-15: 2 mg
  Filled 2022-03-15: qty 2

## 2022-03-15 NOTE — Progress Notes (Signed)
Hematology and Oncology Follow Up Visit ? ?Barry Gibson ?[id number] ?08-09-1961 61 y.o. ?03/15/2022 12:42 PM ?Barry Gibson, Barry Del, DO  ? ?Principle Diagnosis: 61 year old man with stage IV (T4N2) high-grade urothelial carcinoma of the bladder with pelvic and abdominal adenopathy diagnosed in 2022. ? ? ?Prior Therapy: ? ? He underwent robotic assisted laparoscopic radical cystectomy and lymphadenectomy January 2022.  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  He had 4 out of 7 lymph nodes involved with the final pathological staging is T4N2 disease.   ? ? ?He underwent staging scans on April 19, 2021 which showed interval progression of abdominal pelvic adenopathy consistent with worsening nodal metastasis.  ? ? ?Chemotherapy utilizing gemcitabine and cisplatin started on June 08, 2021 receiving day 1 cisplatin and gemcitabine with day 8 gemcitabine alone.  He completed cycle 4 on August 17, 2021. ? ? ?Current therapy: Pembrolizumab 200 mg every 3 weeks started on September 07, 2021.  He is currently receiving 400 mg every 6 weeks.  He presents today for the next cycle of therapy. ? ? ? ?Interim History: [name] presents today for repeat evaluation.  Since last visit, he reports no major changes in his health.  He continues to tolerate current therapy without any complaints.  He denies any nausea, vomiting or abdominal pain.  He denies any skin rashes or lesions.  His performance status and quality of life remains unchanged.  He denies any changes in his bowel habits or respiratory status. ? ? ? ? ?Medications: Updated on review. ?Current Outpatient Medications  ?Medication Sig Dispense Refill  ? acetaminophen (TYLENOL) 650 MG CR tablet Take 1,300 mg by mouth every morning.    ? amLODipine (NORVASC) 10 MG tablet TAKE 1 TABLET(10 MG) BY MOUTH DAILY 90 tablet 1  ? cephALEXin  (KEFLEX) 500 MG capsule Take 1 capsule (500 mg total) by mouth 4 (four) times daily. 28 capsule 1  ? diphenoxylate-atropine (LOMOTIL) 2.5-0.025 MG tablet Take 1 tablet by mouth 4 (four) times daily as needed for diarrhea or loose stools. 30 tablet 0  ? feeding supplement (ENSURE ENLIVE / ENSURE PLUS) LIQD Take 237 mLs by mouth 2 (two) times daily between meals. 237 mL 12  ? lidocaine-prilocaine (EMLA) cream Apply 1 application topically as needed. 30 g 0  ? Multiple Vitamin (MULTIVITAMIN WITH MINERALS) TABS tablet Take 1 tablet by mouth daily.    ? omeprazole (PRILOSEC) 40 MG capsule Take 40 mg by mouth daily.    ? oseltamivir (TAMIFLU) 75 MG capsule Take 1 capsule (75 mg total) by mouth every 12 (twelve) hours. 10 capsule 0  ? prednisoLONE acetate (PRED FORTE) 1 % ophthalmic suspension Place 1 drop into the left eye 2 (two) times a week. Instill 1 drop into left eye Monday and Friday.    ? prochlorperazine (COMPAZINE) 10 MG tablet TAKE 1 TABLET(10 MG) BY MOUTH EVERY 6 HOURS AS NEEDED FOR NAUSEA OR VOMITING 90 tablet 2  ? rosuvastatin (CRESTOR) 20 MG tablet Take 1 tablet (20 mg total) by mouth daily. 90 tablet 2  ? sertraline (ZOLOFT) 50 MG tablet TAKE 1 TABLET(50 MG) BY MOUTH DAILY 45 tablet 0  ? ?No current facility-administered medications for this visit.  ? ?Facility-Administered Medications Ordered in Other Visits  ?Medication Dose Route Frequency Provider Last Rate Last Admin  ? acetaminophen (TYLENOL) 325 MG tablet           ?  diphenhydrAMINE (BENADRYL) 25 mg capsule           ? gemcitabine (GEMZAR) chemo syringe for bladder instillation 2,000 mg  2,000 mg Bladder Instillation Once Franchot Gallo, MD      ? gemcitabine [facility]) chemo syringe for bladder instillation 2,000 mg  2,000 mg Bladder Instillation Once Franchot Gallo, MD      ? ? ? ?Allergies:  ?Allergies  ?Allergen Reactions  ? Ace Inhibitors Cough  ? Diflucan [Fluconazole] Other (See Comments)  ?  Irritated ulcers  ? ? ? ? ?Physical  Exam: ? ? ? ? ?Blood pressure 114/77, pulse 75, temperature 97.8 ?F (36.6 ?C), temperature source Oral, resp. rate 16, weight 214 lb 4.8 oz (97.2 kg), SpO2 99 %. ? ? ? ? ?ECOG: 0 ? ? ? ?General appearance: Comfortable appearing without any discomfort ?Head: Normocephalic without any trauma ?Oropharynx: Mucous membranes are moist and pink without any thrush or ulcers. ?Eyes: Pupils are equal and round reactive to light. ?Lymph nodes: No cervical, supraclavicular, inguinal or axillary lymphadenopathy.   ?Heart:regular rate and rhythm.  S1 and S2 without leg edema. ?Lung: Clear without any rhonchi or wheezes.  No dullness to percussion. ?Abdomin: Soft, nontender, nondistended with good bowel sounds.  No hepatosplenomegaly. ?Musculoskeletal: No joint deformity or effusion.  Full range of motion noted. ?Neurological: No deficits noted on motor, sensory and deep tendon reflex exam. ?Skin: No petechial rash or dryness.  Appeared moist.  ? ? ? ? ? ? ? ? ? ? ?Lab Results: ?Lab Results  ?Component Value Date  ? WBC 9.1 02/01/2022  ? HGB 12.6 (L) 02/01/2022  ? HCT 39.8 02/01/2022  ? MCV 89.4 02/01/2022  ? PLT 211 02/01/2022  ? ?  Chemistry   ?   ?Component Value Date/Time  ? NA 140 02/01/2022 1334  ? NA 141 11/02/2021 1512  ? K 3.5 02/01/2022 1334  ? CL 108 02/01/2022 1334  ? CO2 26 02/01/2022 1334  ? BUN 26 (H) 02/01/2022 1334  ? BUN 23 11/02/2021 1512  ? CREATININE 1.46 (H) 02/01/2022 1334  ? CREATININE 1.05 11/25/2014 0826  ?    ?Component Value Date/Time  ? CALCIUM 9.2 02/01/2022 1334  ? ALKPHOS 77 02/01/2022 1334  ? AST 14 (L) 02/01/2022 1334  ? ALT 15 02/01/2022 1334  ? BILITOT 0.4 02/01/2022 1334  ?  ? ?IMPRESSION: ?1. Further enlargement the right external iliac lymph node, ?currently 1.4 cm in diameter, concerning for potential malignancy. ?2. New 1.0 cm soft tissue nodularity in the left lower quadrant ?along the lower margin of the paracolic gutter, possibly a lymph ?node or small tumor deposit. ?3. Stable  infrarenal abdominal aortic aneurysm 4.0 cm in diameter. ?Recommend follow-up every 12 months, and vascular consultation if ?not already performed. ?4. Other imaging findings of potential clinical significance: Aortic ?Atherosclerosis (ICD10-I70.0). Coronary atherosclerosis. ?Cystoprostatectomy. Emphysema (ICD10-J43.9). Cholelithiasis. ?Prominent stool throughout the colon favors constipation. Airway ?thickening is present, suggesting bronchitis or reactive airways ?disease. Sigmoid colon diverticulosis. Small umbilical hernia ?containing a loop of small bowel. Small supraumbilical hernia ?containing adipose tissue. ? ? ? ?Impression and Plan: ? ?61 year old man with: ? ?1.  Stage IV high-grade urothelial carcinoma of the bladder with pelvic adenopathy diagnosed in 2022. ? ?His disease status was updated at this time and treatment choices were reviewed.  He is currently on Pembrolizumab for salvage purposes without any major complications.  CT scan obtained on March 13, 2022 showed overall mild progression of his lymphadenopathy but very limited disease overall.  Risks and benefits of continuing this treatment versus switching to different salvage therapy option were reviewed.  Salvage therapy including Padcev would be an option.  After discussion today, I recommended continuing the same treatment and reserve salvage therapy if he continues to progress. ?  ?2.  IV access: Port-A-Cath currently in use without any issues. ? ?3.  Antiemetics: Compazine is available doing without any nausea or vomiting. ?  ?4.  Renal function surveillance: Creatinine clearance continues to be around 55 cc/min.  We will continue to monitor on subsequent visits. ?  ?5.  Goals of care: His disease remains palliative at this time.  Aggressive measures are warranted given his excellent performance status. ? ?6.  Immune mediated complications: I continue to educate him about potential complications including pneumonitis, colitis, hepatitis  and hypophysitis.  His TSH is low and will be repeated today. ? ?7.  Recurrent urinary tract infection: No issues reported since the last visit. ? ?  ?8.  Follow-up: He will return in 6 weeks for repeat evaluation. ?  ?30

## 2022-03-15 NOTE — Progress Notes (Signed)
Per Alen Blew MD, ok to treat today with SCR 1.65 ?

## 2022-03-15 NOTE — Patient Instructions (Signed)
[facility]   ?Discharge Instructions: ?Thank you for choosing [facility] to provide your oncology and hematology care.  ? ?If you have a lab appointment with the Cancer Center, please go directly to the Cancer Center and check in at the registration area. ?  ?Wear comfortable clothing and clothing appropriate for easy access to any Portacath or PICC line.  ? ?We strive to give you quality time with your provider. You may need to reschedule your appointment if you arrive late (15 or more minutes).  Arriving late affects you and other patients whose appointments are after yours.  Also, if you miss three or more appointments without notifying the office, you may be dismissed from the clinic at the provider?s discretion.    ?  ?For prescription refill requests, have your pharmacy contact our office and allow 72 hours for refills to be completed.   ? ?Today you received the following chemotherapy and/or immunotherapy agents: pembrolizumab    ?  ?To help prevent nausea and vomiting after your treatment, we encourage you to take your nausea medication as directed. ? ?BELOW ARE SYMPTOMS THAT SHOULD BE REPORTED IMMEDIATELY: ?*FEVER GREATER THAN 100.4 F (38 ?C) OR HIGHER ?*CHILLS OR SWEATING ?*NAUSEA AND VOMITING THAT IS NOT CONTROLLED WITH YOUR NAUSEA MEDICATION ?*UNUSUAL SHORTNESS OF BREATH ?*UNUSUAL BRUISING OR BLEEDING ?*URINARY PROBLEMS (pain or burning when urinating, or frequent urination) ?*BOWEL PROBLEMS (unusual diarrhea, constipation, pain near the anus) ?TENDERNESS IN MOUTH AND THROAT WITH OR WITHOUT PRESENCE OF ULCERS (sore throat, sores in mouth, or a toothache) ?UNUSUAL RASH, SWELLING OR PAIN  ?UNUSUAL VAGINAL DISCHARGE OR ITCHING  ? ?Items with * indicate a potential emergency and should be followed up as soon as possible or go to the Emergency Department if any problems should occur. ? ?Please show the CHEMOTHERAPY ALERT CARD or IMMUNOTHERAPY ALERT CARD at  check-in to the Emergency Department and triage nurse. ? ?Should you have questions after your visit or need to cancel or reschedule your appointment, please contact [facility]  Dept: [phone]  and follow the prompts.  Office hours are 8:00 a.m. to 4:30 p.m. Monday - Friday. Please note that voicemails left after 4:00 p.m. may not be returned until the following business day.  We are closed weekends and major holidays. You have access to a nurse at all times for urgent questions. Please call the main number to the clinic Dept: [phone] and follow the prompts. ? ? ?For any non-urgent questions, you may also contact your provider using MyChart. We now offer e-Visits for anyone 18 and older to request care online for non-urgent symptoms. For details visit [email or web address]. ?  ?Also download the MyChart app! Go to the app store, search "MyChart", open the app, select , and log in with your MyChart username and password. ? ?Due to Covid, a mask is required upon entering the hospital/clinic. If you do not have a mask, one will be given to you upon arrival. For doctor visits, patients may have 1 support person aged 18 or older with them. For treatment visits, patients cannot have anyone with them due to current Covid guidelines and our immunocompromised population.  ? ?

## 2022-03-17 ENCOUNTER — Other Ambulatory Visit: Payer: Self-pay | Admitting: Family Medicine

## 2022-03-20 ENCOUNTER — Other Ambulatory Visit: Payer: Self-pay | Admitting: Family Medicine

## 2022-03-20 DIAGNOSIS — F32A Depression, unspecified: Secondary | ICD-10-CM

## 2022-03-20 DIAGNOSIS — F419 Anxiety disorder, unspecified: Secondary | ICD-10-CM

## 2022-04-03 ENCOUNTER — Telehealth: Payer: Self-pay | Admitting: Oncology

## 2022-04-03 NOTE — Telephone Encounter (Signed)
Called patient regarding upcoming appointment, patient is notified. °

## 2022-04-26 ENCOUNTER — Inpatient Hospital Stay: Payer: BC Managed Care – PPO

## 2022-04-26 ENCOUNTER — Inpatient Hospital Stay (HOSPITAL_BASED_OUTPATIENT_CLINIC_OR_DEPARTMENT_OTHER): Payer: BC Managed Care – PPO | Admitting: Oncology

## 2022-04-26 ENCOUNTER — Inpatient Hospital Stay: Payer: BC Managed Care – PPO | Attending: Oncology

## 2022-04-26 VITALS — BP 129/83 | HR 80 | Temp 97.5°F | Resp 18 | Ht 71.0 in | Wt 220.1 lb

## 2022-04-26 DIAGNOSIS — C670 Malignant neoplasm of trigone of bladder: Secondary | ICD-10-CM

## 2022-04-26 DIAGNOSIS — C679 Malignant neoplasm of bladder, unspecified: Secondary | ICD-10-CM

## 2022-04-26 DIAGNOSIS — Z95828 Presence of other vascular implants and grafts: Secondary | ICD-10-CM

## 2022-04-26 DIAGNOSIS — R590 Localized enlarged lymph nodes: Secondary | ICD-10-CM | POA: Insufficient documentation

## 2022-04-26 DIAGNOSIS — Z5112 Encounter for antineoplastic immunotherapy: Secondary | ICD-10-CM | POA: Diagnosis present

## 2022-04-26 DIAGNOSIS — Z79899 Other long term (current) drug therapy: Secondary | ICD-10-CM | POA: Insufficient documentation

## 2022-04-26 DIAGNOSIS — E236 Other disorders of pituitary gland: Secondary | ICD-10-CM | POA: Diagnosis not present

## 2022-04-26 DIAGNOSIS — Z87440 Personal history of urinary (tract) infections: Secondary | ICD-10-CM | POA: Diagnosis not present

## 2022-04-26 DIAGNOSIS — Z883 Allergy status to other anti-infective agents status: Secondary | ICD-10-CM | POA: Diagnosis not present

## 2022-04-26 LAB — CBC WITH DIFFERENTIAL (CANCER CENTER ONLY)
Abs Immature Granulocytes: 0.1 10*3/uL — ABNORMAL HIGH (ref 0.00–0.07)
Basophils Absolute: 0.1 10*3/uL (ref 0.0–0.1)
Basophils Relative: 1 %
Eosinophils Absolute: 0.2 10*3/uL (ref 0.0–0.5)
Eosinophils Relative: 2 %
HCT: 42 % (ref 39.0–52.0)
Hemoglobin: 13.7 g/dL (ref 13.0–17.0)
Immature Granulocytes: 1 %
Lymphocytes Relative: 25 %
Lymphs Abs: 2.5 10*3/uL (ref 0.7–4.0)
MCH: 28.2 pg (ref 26.0–34.0)
MCHC: 32.6 g/dL (ref 30.0–36.0)
MCV: 86.6 fL (ref 80.0–100.0)
Monocytes Absolute: 0.8 10*3/uL (ref 0.1–1.0)
Monocytes Relative: 8 %
Neutro Abs: 6 10*3/uL (ref 1.7–7.7)
Neutrophils Relative %: 63 %
Platelet Count: 279 10*3/uL (ref 150–400)
RBC: 4.85 MIL/uL (ref 4.22–5.81)
RDW: 14.6 % (ref 11.5–15.5)
WBC Count: 9.7 10*3/uL (ref 4.0–10.5)
nRBC: 0 % (ref 0.0–0.2)

## 2022-04-26 LAB — CMP (CANCER CENTER ONLY)
ALT: 18 U/L (ref 0–44)
AST: 14 U/L — ABNORMAL LOW (ref 15–41)
Albumin: 3.8 g/dL (ref 3.5–5.0)
Alkaline Phosphatase: 67 U/L (ref 38–126)
Anion gap: 7 (ref 5–15)
BUN: 35 mg/dL — ABNORMAL HIGH (ref 6–20)
CO2: 25 mmol/L (ref 22–32)
Calcium: 9.4 mg/dL (ref 8.9–10.3)
Chloride: 108 mmol/L (ref 98–111)
Creatinine: 1.6 mg/dL — ABNORMAL HIGH (ref 0.61–1.24)
GFR, Estimated: 49 mL/min — ABNORMAL LOW (ref >60)
Glucose, Bld: 95 mg/dL (ref 70–99)
Potassium: 3.7 mmol/L (ref 3.5–5.1)
Sodium: 140 mmol/L (ref 135–145)
Total Bilirubin: 0.3 mg/dL (ref 0.3–1.2)
Total Protein: 7.1 g/dL (ref 6.5–8.1)

## 2022-04-26 LAB — TSH: TSH: 1.877 u[IU]/mL (ref 0.350–4.500)

## 2022-04-26 MED ORDER — SODIUM CHLORIDE 0.9 % IV SOLN: Freq: Once | INTRAVENOUS | Status: AC

## 2022-04-26 MED ORDER — SODIUM CHLORIDE 0.9% FLUSH: 10.0000 mL | INTRAVENOUS | Status: DC | PRN

## 2022-04-26 MED ORDER — SODIUM CHLORIDE 0.9 % IV SOLN
400.0000 mg | Freq: Once | INTRAVENOUS | Status: AC
  Administered 2022-04-26: 400 mg via INTRAVENOUS
  Filled 2022-04-26: qty 16

## 2022-04-26 MED ORDER — SODIUM CHLORIDE 0.9% FLUSH
10.0000 mL | Freq: Once | INTRAVENOUS | Status: AC
  Administered 2022-04-26: 10 mL

## 2022-04-26 MED ORDER — HEPARIN SOD (PORK) LOCK FLUSH 100 UNIT/ML IV SOLN: 500.0000 [IU] | Freq: Once | INTRAVENOUS | Status: DC | PRN

## 2022-04-26 NOTE — Progress Notes (Signed)
Per Dr. Alen Blew, okay to treat with creatinine 1.6

## 2022-04-26 NOTE — Progress Notes (Signed)
Hematology and Oncology Follow Up Visit  Barry Gibson [id number] May 01, 1961 61 y.o. 04/26/2022 1:51 PM [name], [place], DO   Principle Diagnosis: 61 year old man with bladder cancer diagnosed in 2022. 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   Prior Therapy:   He underwent robotic assisted laparoscopic radical cystectomy and lymphadenectomy January 2022.  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  He had 4 out of 7 lymph nodes involved with the final pathological staging is T4N2 disease.     He underwent staging scans on April 19, 2021 which showed interval progression of abdominal pelvic adenopathy consistent with worsening nodal metastasis.    Chemotherapy utilizing gemcitabine and cisplatin started on June 08, 2021 receiving day 1 cisplatin and gemcitabine with day 8 gemcitabine alone.  He completed cycle 4 on August 17, 2021.   Current therapy: Pembrolizumab 200 mg every 3 weeks started on September 07, 2021.  He is currently receiving 400 mg every 6 weeks.  He is here for subsequent treatment therapy.     Interim History: Barry Gibson is here for follow up visit.  Since the last visit, he reports feeling well without any complaints.  He denies any nausea or vomiting or abdominal pain.  He denies any diarrhea or excessive fatigue.  His performance status and quality of life remains unchanged.  Denies any skin rashes or discomfort.     Medications: reviewed with changes Current Outpatient Medications  Medication Sig Dispense Refill   acetaminophen (TYLENOL) 650 MG CR tablet Take 1,300 mg by mouth every morning.     amLODipine (NORVASC) 10 MG tablet TAKE 1 TABLET(10 MG) BY MOUTH DAILY 90 tablet 1   cephALEXin (KEFLEX) 500 MG capsule Take 1 capsule (500 mg total) by  mouth 4 (four) times daily. 28 capsule 1   diphenoxylate-atropine (LOMOTIL) 2.5-0.025 MG tablet Take 1 tablet by mouth 4 (four) times daily as needed for diarrhea or loose stools. 30 tablet 0   feeding supplement (ENSURE ENLIVE / ENSURE PLUS) LIQD Take 237 mLs by mouth 2 (two) times daily between meals. 237 mL 12   lidocaine-prilocaine (EMLA) cream Apply 1 application topically as needed. 30 g 0   Multiple Vitamin (MULTIVITAMIN WITH MINERALS) TABS tablet Take 1 tablet by mouth daily.     omeprazole (PRILOSEC) 40 MG capsule Take 40 mg by mouth daily.     oseltamivir (TAMIFLU) 75 MG capsule Take 1 capsule (75 mg total) by mouth every 12 (twelve) hours. 10 capsule 0   prednisoLONE acetate (PRED FORTE) 1 % ophthalmic suspension Place 1 drop into the left eye 2 (two) times a week. Instill 1 drop into left eye Monday and Friday.     prochlorperazine (COMPAZINE) 10 MG tablet TAKE 1 TABLET(10 MG) BY MOUTH EVERY 6 HOURS AS NEEDED FOR NAUSEA OR VOMITING 90 tablet 2   rosuvastatin (CRESTOR) 20 MG tablet TAKE 1 TABLET(20 MG) BY MOUTH DAILY 90 tablet 1   sertraline (ZOLOFT) 50 MG tablet TAKE 1 TABLET(50 MG) BY MOUTH DAILY 45 tablet 1   No current facility-administered medications for this visit.   Facility-Administered Medications Ordered in Other Visits  Medication Dose Route Frequency Provider Last Rate Last Admin   acetaminophen (TYLENOL) 325 MG tablet            diphenhydrAMINE (BENADRYL) 25 mg capsule  gemcitabine (GEMZAR) chemo syringe for bladder instillation 2,000 mg  2,000 mg Bladder Instillation Once Franchot Gallo, MD       gemcitabine [facility]) chemo syringe for bladder instillation 2,000 mg  2,000 mg Bladder Instillation Once Franchot Gallo, MD         Allergies:  Allergies  Allergen Reactions   Ace Inhibitors Cough   Diflucan [Fluconazole] Other (See Comments)    Irritated ulcers      Physical Exam:     Blood pressure 129/83, pulse 80, temperature (!) 97.5 F  (36.4 C), temperature source Temporal, resp. rate 18, height '5\' 11"'$  (1.803 m), weight 220 lb 1.6 oz (99.8 kg), SpO2 97 %.      ECOG: 0   General appearance: Alert, awake without any distress. Head: Atraumatic without abnormalities Oropharynx: Without any thrush or ulcers. Eyes: No scleral icterus. Lymph nodes: No lymphadenopathy noted in the cervical, supraclavicular, or axillary nodes Heart:regular rate and rhythm, without any murmurs or gallops.   Lung: Clear to auscultation without any rhonchi, wheezes or dullness to percussion. Abdomin: Soft, nontender without any shifting dullness or ascites. Musculoskeletal: No clubbing or cyanosis. Neurological: No motor or sensory deficits. Skin: No rashes or lesions. Psychiatric: Mood and affect appeared normal.            Lab Results: Lab Results  Component Value Date   WBC 8.4 03/15/2022   HGB 13.4 03/15/2022   HCT 41.3 03/15/2022   MCV 87.9 03/15/2022   PLT 198 03/15/2022   PSA 0.53 11/25/2014     Chemistry      Component Value Date/Time   NA 139 03/15/2022 1251   NA 141 11/02/2021 1512   K 4.0 03/15/2022 1251   CL 107 03/15/2022 1251   CO2 26 03/15/2022 1251   BUN 24 (H) 03/15/2022 1251   BUN 23 11/02/2021 1512   CREATININE 1.65 (H) 03/15/2022 1251   CREATININE 1.05 11/25/2014 0826      Component Value Date/Time   CALCIUM 9.1 03/15/2022 1251   ALKPHOS 78 03/15/2022 1251   AST 13 (L) 03/15/2022 1251   ALT 11 03/15/2022 1251   BILITOT 0.4 03/15/2022 1251        Impression and Plan:  61 year old man with:  1.  Bladder cancer diagnosed in 2022. He was found to have stage IV high-grade urothelial carcinoma with pelvic adenopathy diagnosed in 2022.  He continues to be on Pembrolizumab without any major complications.  Risks and benefits of continuing this treatment versus switching to a different salvage therapy options were reiterated.  Padcev versus FGFR targeted therapy could be considered upon  progression of disease.  Plan is to update his staging scan after the next cycle and July 2023.  Local radiation to enlarging lymph node could be considered if it is an isolated area of metastasis.  After discussion today he is agreeable to proceed with Pembrolizumab today and repeated in June 07, 2022.  He will have CT scan on June 14, 2022 and a follow-up visit the following week.    2.  IV access: Port-A-Cath remains in use without any issues.  3.  Antiemetics: No nausea or vomiting reported at this time.  Compazine is available to him.   4.  Renal function surveillance: His creatinine clearance around 48 cc/min.  Has not dramatically changed at this time we will continue to monitor.   5.  Goals of care: Therapy remains palliative though aggressive measures are warranted.  6.  Immune mediated complications: He has  not experienced any complications at this time.  Pneumonitis, hepatitis, hypophysitis among others.  TSH was normal based on last labs on April 28 and will be repeated today.   7.  Recurrent urinary tract infection: No recent infection noted at this time.    8.  Follow-up: In 6 weeks for repeat follow-up.   30  minutes were spent on this encounter.  The time was dedicated to reviewing laboratory data, disease status update and outlining future plan of care discussion.   Zola Button, MD 6/9/20231:51 PM

## 2022-04-26 NOTE — Patient Instructions (Signed)
[facility]   ?Discharge Instructions: ?Thank you for choosing [facility] to provide your oncology and hematology care.  ? ?If you have a lab appointment with the Cancer Center, please go directly to the Cancer Center and check in at the registration area. ?  ?Wear comfortable clothing and clothing appropriate for easy access to any Portacath or PICC line.  ? ?We strive to give you quality time with your provider. You may need to reschedule your appointment if you arrive late (15 or more minutes).  Arriving late affects you and other patients whose appointments are after yours.  Also, if you miss three or more appointments without notifying the office, you may be dismissed from the clinic at the provider?s discretion.    ?  ?For prescription refill requests, have your pharmacy contact our office and allow 72 hours for refills to be completed.   ? ?Today you received the following chemotherapy and/or immunotherapy agents: pembrolizumab    ?  ?To help prevent nausea and vomiting after your treatment, we encourage you to take your nausea medication as directed. ? ?BELOW ARE SYMPTOMS THAT SHOULD BE REPORTED IMMEDIATELY: ?*FEVER GREATER THAN 100.4 F (38 ?C) OR HIGHER ?*CHILLS OR SWEATING ?*NAUSEA AND VOMITING THAT IS NOT CONTROLLED WITH YOUR NAUSEA MEDICATION ?*UNUSUAL SHORTNESS OF BREATH ?*UNUSUAL BRUISING OR BLEEDING ?*URINARY PROBLEMS (pain or burning when urinating, or frequent urination) ?*BOWEL PROBLEMS (unusual diarrhea, constipation, pain near the anus) ?TENDERNESS IN MOUTH AND THROAT WITH OR WITHOUT PRESENCE OF ULCERS (sore throat, sores in mouth, or a toothache) ?UNUSUAL RASH, SWELLING OR PAIN  ?UNUSUAL VAGINAL DISCHARGE OR ITCHING  ? ?Items with * indicate a potential emergency and should be followed up as soon as possible or go to the Emergency Department if any problems should occur. ? ?Please show the CHEMOTHERAPY ALERT CARD or IMMUNOTHERAPY ALERT CARD at  check-in to the Emergency Department and triage nurse. ? ?Should you have questions after your visit or need to cancel or reschedule your appointment, please contact [facility]  Dept: [phone]  and follow the prompts.  Office hours are 8:00 a.m. to 4:30 p.m. Monday - Friday. Please note that voicemails left after 4:00 p.m. may not be returned until the following business day.  We are closed weekends and major holidays. You have access to a nurse at all times for urgent questions. Please call the main number to the clinic Dept: [phone] and follow the prompts. ? ? ?For any non-urgent questions, you may also contact your provider using MyChart. We now offer e-Visits for anyone 18 and older to request care online for non-urgent symptoms. For details visit [email or web address]. ?  ?Also download the MyChart app! Go to the app store, search "MyChart", open the app, select [facility], and log in with your MyChart username and password. ? ?Due to Covid, a mask is required upon entering the hospital/clinic. If you do not have a mask, one will be given to you upon arrival. For doctor visits, patients may have 1 support Terius Jacuinde aged 60 or older with them. For treatment visits, patients cannot have anyone with them due to current Covid guidelines and our immunocompromised population.  ? ?

## 2022-06-03 ENCOUNTER — Inpatient Hospital Stay (HOSPITAL_BASED_OUTPATIENT_CLINIC_OR_DEPARTMENT_OTHER): Payer: BC Managed Care – PPO | Admitting: Physician Assistant

## 2022-06-03 ENCOUNTER — Other Ambulatory Visit: Payer: Self-pay

## 2022-06-03 ENCOUNTER — Telehealth: Payer: Self-pay | Admitting: *Deleted

## 2022-06-03 ENCOUNTER — Inpatient Hospital Stay: Payer: BC Managed Care – PPO | Attending: Oncology

## 2022-06-03 ENCOUNTER — Inpatient Hospital Stay: Payer: BC Managed Care – PPO

## 2022-06-03 VITALS — BP 106/79 | HR 76 | Temp 97.9°F | Resp 17

## 2022-06-03 DIAGNOSIS — C670 Malignant neoplasm of trigone of bladder: Secondary | ICD-10-CM

## 2022-06-03 DIAGNOSIS — J189 Pneumonia, unspecified organism: Secondary | ICD-10-CM | POA: Insufficient documentation

## 2022-06-03 DIAGNOSIS — Z87891 Personal history of nicotine dependence: Secondary | ICD-10-CM | POA: Diagnosis not present

## 2022-06-03 DIAGNOSIS — Z5112 Encounter for antineoplastic immunotherapy: Secondary | ICD-10-CM | POA: Diagnosis present

## 2022-06-03 DIAGNOSIS — R5383 Other fatigue: Secondary | ICD-10-CM | POA: Insufficient documentation

## 2022-06-03 DIAGNOSIS — Z8616 Personal history of COVID-19: Secondary | ICD-10-CM | POA: Diagnosis not present

## 2022-06-03 DIAGNOSIS — R110 Nausea: Secondary | ICD-10-CM | POA: Diagnosis not present

## 2022-06-03 DIAGNOSIS — Z883 Allergy status to other anti-infective agents status: Secondary | ICD-10-CM | POA: Diagnosis not present

## 2022-06-03 DIAGNOSIS — N390 Urinary tract infection, site not specified: Secondary | ICD-10-CM | POA: Diagnosis not present

## 2022-06-03 DIAGNOSIS — Z936 Other artificial openings of urinary tract status: Secondary | ICD-10-CM | POA: Diagnosis not present

## 2022-06-03 DIAGNOSIS — Z95828 Presence of other vascular implants and grafts: Secondary | ICD-10-CM

## 2022-06-03 DIAGNOSIS — C679 Malignant neoplasm of bladder, unspecified: Secondary | ICD-10-CM

## 2022-06-03 DIAGNOSIS — Z87440 Personal history of urinary (tract) infections: Secondary | ICD-10-CM | POA: Diagnosis not present

## 2022-06-03 DIAGNOSIS — I10 Essential (primary) hypertension: Secondary | ICD-10-CM | POA: Diagnosis not present

## 2022-06-03 DIAGNOSIS — Z79899 Other long term (current) drug therapy: Secondary | ICD-10-CM | POA: Diagnosis not present

## 2022-06-03 DIAGNOSIS — Z9079 Acquired absence of other genital organ(s): Secondary | ICD-10-CM | POA: Diagnosis not present

## 2022-06-03 LAB — URINALYSIS, COMPLETE (UACMP) WITH MICROSCOPIC
Bilirubin Urine: NEGATIVE
Glucose, UA: NEGATIVE mg/dL
Ketones, ur: NEGATIVE mg/dL
Nitrite: POSITIVE — AB
Protein, ur: 30 mg/dL — AB
Specific Gravity, Urine: 1.014 (ref 1.005–1.030)
WBC, UA: >50 WBC/hpf — ABNORMAL HIGH (ref 0–5)
pH: 5 (ref 5.0–8.0)

## 2022-06-03 LAB — CBC WITH DIFFERENTIAL (CANCER CENTER ONLY)
Abs Immature Granulocytes: 0.01 10*3/uL (ref 0.00–0.07)
Basophils Absolute: 0.1 10*3/uL (ref 0.0–0.1)
Basophils Relative: 1 %
Eosinophils Absolute: 0.4 10*3/uL (ref 0.0–0.5)
Eosinophils Relative: 6 %
HCT: 41.6 % (ref 39.0–52.0)
Hemoglobin: 13.9 g/dL (ref 13.0–17.0)
Immature Granulocytes: 0 %
Lymphocytes Relative: 23 %
Lymphs Abs: 1.6 10*3/uL (ref 0.7–4.0)
MCH: 28.3 pg (ref 26.0–34.0)
MCHC: 33.4 g/dL (ref 30.0–36.0)
MCV: 84.6 fL (ref 80.0–100.0)
Monocytes Absolute: 0.6 10*3/uL (ref 0.1–1.0)
Monocytes Relative: 9 %
Neutro Abs: 4.4 10*3/uL (ref 1.7–7.7)
Neutrophils Relative %: 61 %
Platelet Count: 210 10*3/uL (ref 150–400)
RBC: 4.92 MIL/uL (ref 4.22–5.81)
RDW: 14.6 % (ref 11.5–15.5)
WBC Count: 7.1 10*3/uL (ref 4.0–10.5)
nRBC: 0 % (ref 0.0–0.2)

## 2022-06-03 LAB — CMP (CANCER CENTER ONLY)
ALT: 12 U/L (ref 0–44)
AST: 15 U/L (ref 15–41)
Albumin: 3.9 g/dL (ref 3.5–5.0)
Alkaline Phosphatase: 69 U/L (ref 38–126)
Anion gap: 5 (ref 5–15)
BUN: 25 mg/dL — ABNORMAL HIGH (ref 6–20)
CO2: 27 mmol/L (ref 22–32)
Calcium: 9.3 mg/dL (ref 8.9–10.3)
Chloride: 105 mmol/L (ref 98–111)
Creatinine: 1.88 mg/dL — ABNORMAL HIGH (ref 0.61–1.24)
GFR, Estimated: 41 mL/min — ABNORMAL LOW (ref >60)
Glucose, Bld: 91 mg/dL (ref 70–99)
Potassium: 3.8 mmol/L (ref 3.5–5.1)
Sodium: 137 mmol/L (ref 135–145)
Total Bilirubin: 0.5 mg/dL (ref 0.3–1.2)
Total Protein: 7.1 g/dL (ref 6.5–8.1)

## 2022-06-03 MED ORDER — HEPARIN SOD (PORK) LOCK FLUSH 100 UNIT/ML IV SOLN
500.0000 [IU] | Freq: Once | INTRAVENOUS | Status: AC
  Administered 2022-06-03: 500 [IU]

## 2022-06-03 MED ORDER — SODIUM CHLORIDE 0.9% FLUSH
10.0000 mL | Freq: Once | INTRAVENOUS | Status: AC
  Administered 2022-06-03: 10 mL

## 2022-06-03 MED ORDER — DEXTROSE 5 % IV SOLN
1.0000 g | INTRAVENOUS | Status: DC
  Administered 2022-06-03: 1 g via INTRAVENOUS
  Filled 2022-06-03: qty 10

## 2022-06-03 MED ORDER — SODIUM CHLORIDE 0.9 % IV SOLN: Freq: Once | INTRAVENOUS | Status: AC

## 2022-06-03 NOTE — Telephone Encounter (Signed)
Returned PC to patient's wife, Vergia Alcon, informed her [name] can be seen for labs at 1:00 & then by [facility] at 1:30 - she verbalizes understanding.

## 2022-06-03 NOTE — Progress Notes (Signed)
[facility] OFFICE PROGRESS NOTE  Coral Spikes, DO [address] [place] [id number]  DIAGNOSIS: 61 year old man with bladder cancer diagnosed in 2022. 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  PRIOR THERAPY: 1) He underwent robotic assisted laparoscopic radical cystectomy and lymphadenectomy January 2022.  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  He had 4 out of 7 lymph nodes involved with the final pathological staging is T4N2 disease.      2) He underwent staging scans on April 19, 2021 which showed interval progression of abdominal pelvic adenopathy consistent with worsening nodal metastasis.      3) Chemotherapy utilizing gemcitabine and cisplatin started on June 08, 2021 receiving day 1 cisplatin and gemcitabine with day 8 gemcitabine alone.  He completed cycle 4 on August 17, 2021.    CURRENT THERAPY:  Pembrolizumab 200 mg every 3 weeks started on September 07, 2021.  He is currently receiving 400 mg every 6 weeks.  He is here for subsequent treatment therapy.   INTERVAL HISTORY: Barry Gibson 61 y.o. male returns to the clinic today for a follow-up visit.  The patient was last seen by Dr. Alen Blew on 04/26/2022.  The patient is currently undergoing immunotherapy with Keytruda 400 mg every 6 weeks.  Since last being seen, the patient was seen in the [facility] on 06/03/2022 for the chief complaint of fatigue, decreased appetite, nausea, and low-grade fevers.  The patient had similar symptoms when he had urinary tract infections in the past.  The patient had a slight elevation of his creatinine at 1.88 that day and his UA was concerning for possible infection with positive for nitrites, small leukocytes, and over 50 white blood cells and multiple bacteria.  Urine culture showed  multiple species she is present and recommended recollection.  The patient was given a dose of Rocephin in the clinic and prescribed Keflex.  He was also given 1 L of fluids.  I received a message yesterday stating that the patient's symptoms were not improving.    Today, the patient tells me he is feeling fairly well today.  He denies any fever since starting antibiotics.  He also reported that he was having back discomfort prior to starting antibiotics which has since resolved.  He also reports his nausea has resolved.  He still reports some fatigue but is functional.  The patient does feel like he is feeling better since last being seen.  He reports his urine was malodorous prior to starting antibiotics but denies any malodorous urine at this time.  His urine is clear/yellow.  He follows with Dr. Tammi Klippel from urology for his urostomy bag.    Today he denies any rashes or skin changes.  Denies any chest pain, shortness of breath, or hemoptysis.  He does mention that he has had a persistent dry cough since starting Keytruda in October 2022.  He reports the cough is unchanged.  He denies any abdominal pain.  He denies any nausea, vomiting, or diarrhea.  He reports that he has regular bowel movements but has not had a bowel movement today and he is going to drink coffee upon returning home which typically helps him.  He is scheduled for a restaging CT scan on 06/17/22. He is here today for evaluation and repeat blood work before undergoing his next cycle of  treatment.    MEDICAL HISTORY: Past Medical History:  Diagnosis Date   Cancer [facility])    bladder   Cataract    COVID-19 11/13/2019   h/a, chills, fatigue, loss of taste and smell, all symptoms resolved in a few weeks   Frequent urination    Fuchs' corneal dystrophy    Full dentures    GERD (gastroesophageal reflux disease)    Hypertension    Rheumatoid arthritis ([place])     ALLERGIES:  is allergic to ace inhibitors and diflucan  [fluconazole].  MEDICATIONS:  Current Outpatient Medications  Medication Sig Dispense Refill   acetaminophen (TYLENOL) 650 MG CR tablet Take 1,300 mg by mouth every morning.     amLODipine (NORVASC) 10 MG tablet TAKE 1 TABLET(10 MG) BY MOUTH DAILY 90 tablet 1   lidocaine-prilocaine (EMLA) cream Apply 1 application topically as needed. 30 g 0   Multiple Vitamin (MULTIVITAMIN WITH MINERALS) TABS tablet Take 1 tablet by mouth daily.     omeprazole (PRILOSEC) 40 MG capsule Take 40 mg by mouth daily.     prednisoLONE acetate (PRED FORTE) 1 % ophthalmic suspension Place 1 drop into the left eye 2 (two) times a week. Instill 1 drop into left eye Monday and Friday.     prochlorperazine (COMPAZINE) 10 MG tablet TAKE 1 TABLET(10 MG) BY MOUTH EVERY 6 HOURS AS NEEDED FOR NAUSEA OR VOMITING 90 tablet 2   rosuvastatin (CRESTOR) 20 MG tablet TAKE 1 TABLET(20 MG) BY MOUTH DAILY 90 tablet 1   sertraline (ZOLOFT) 50 MG tablet TAKE 1 TABLET(50 MG) BY MOUTH DAILY 45 tablet 1   sildenafil (VIAGRA) 100 MG tablet SMARTSIG:1 Tablet(s) By Mouth     No current facility-administered medications for this visit.   Facility-Administered Medications Ordered in Other Visits  Medication Dose Route Frequency Provider Last Rate Last Admin   acetaminophen (TYLENOL) 325 MG tablet            diphenhydrAMINE (BENADRYL) 25 mg capsule            gemcitabine (GEMZAR) chemo syringe for bladder instillation 2,000 mg  2,000 mg Bladder Instillation Once Franchot Gallo, MD       gemcitabine [facility]) chemo syringe for bladder instillation 2,000 mg  2,000 mg Bladder Instillation Once Franchot Gallo, MD        SURGICAL HISTORY:  Past Surgical History:  Procedure Laterality Date   CORNEAL TRANSPLANT Left 2013   CYSTOSCOPY WITH BIOPSY N/A 09/14/2019   Procedure: CYSTOSCOPY WITH BIOPSY;  Surgeon: Franchot Gallo, MD;  Location: [facility];  Service: Urology;  Laterality: N/A;   CYSTOSCOPY WITH INJECTION N/A 11/29/2020    Procedure: CYSTOSCOPY WITH INJECTION OF INDOCYANINE GREEN DYE;  Surgeon: Alexis Frock, MD;  Location: [facility];  Service: Urology;  Laterality: N/A;  6 HRS   FULGURATION OF BLADDER TUMOR N/A 09/14/2019   Procedure: FULGURATION OF BLADDER TUMOR;  Surgeon: Franchot Gallo, MD;  Location: [facility];  Service: Urology;  Laterality: N/A;   IR IMAGING GUIDED PORT INSERTION  06/07/2021   IR NEPHROSTOMY EXCHANGE LEFT  10/30/2020   IR NEPHROSTOMY EXCHANGE LEFT  11/19/2020   IR NEPHROSTOMY EXCHANGE RIGHT  10/30/2020   IR NEPHROSTOMY EXCHANGE RIGHT  11/19/2020   IR NEPHROSTOMY PLACEMENT LEFT  09/27/2020   IR NEPHROSTOMY PLACEMENT RIGHT  09/27/2020   ROBOT ASSISTED LAPAROSCOPIC COMPLETE CYSTECT ILEAL CONDUIT N/A 11/29/2020   Procedure: XI ROBOTIC ASSISTED LAPAROSCOPIC COMPLETE CYSTECT ILEAL CONDUIT/RADICAL PROSTATECTOMY/LYMPHADENECTOMY;  Surgeon: Alexis Frock, MD;  Location: [facility];  Service: Urology;  Laterality: N/A;   TRANSURETHRAL RESECTION OF BLADDER TUMOR WITH MITOMYCIN-C N/A 08/05/2019   Procedure: TRANSURETHRAL RESECTION OF BLADDER TUMOR;  Surgeon: Franchot Gallo, MD;  Location: [facility];  Service: Urology;  Laterality: N/A;  1 HR   TRANSURETHRAL RESECTION OF BLADDER TUMOR WITH MITOMYCIN-C N/A 04/24/2020   Procedure: TRANSURETHRAL RESECTION OF BLADDER TUMOR WITH GEMCIDABINE;  Surgeon: Franchot Gallo, MD;  Location: [facility];  Service: Urology;  Laterality: N/A;    REVIEW OF SYSTEMS:   Review of Systems  Constitutional: Positive for fatigue.  Negative for appetite change, chills, fever and unexpected weight change.  HENT: Negative for mouth sores, nosebleeds, sore throat and trouble swallowing.   Eyes: Negative for eye problems and icterus.  Respiratory: Positive for stable chronic cough.  Negative for hemoptysis, shortness of breath and wheezing.   Cardiovascular: Negative for chest pain and leg swelling.  Gastrointestinal: Positive for mild constipation.   Negative for abdominal pain, diarrhea, nausea and vomiting.  Genitourinary: Negative for bladder incontinence, difficulty urinating, dysuria, frequency and hematuria.   Musculoskeletal: Negative for back pain, gait problem, neck pain and neck stiffness.  Skin: Negative for itching and rash.  Neurological: Negative for dizziness, extremity weakness, gait problem, headaches, light-headedness and seizures.  Hematological: Negative for adenopathy. Does not bruise/bleed easily.  Psychiatric/Behavioral: Negative for confusion, depression and sleep disturbance. The patient is not nervous/anxious.     PHYSICAL EXAMINATION:  Blood pressure 105/77, pulse 98, temperature (!) 97.5 F (36.4 C), resp. rate 15, weight 215 lb 1.6 oz (97.6 kg), SpO2 95 %.  ECOG PERFORMANCE STATUS: 0-1  Physical Exam  Constitutional: Oriented to person, place, and time and well-developed, well-nourished, and in no distress.  HENT:  Head: Normocephalic and atraumatic.  Mouth/Throat: Oropharynx is clear and moist. No oropharyngeal exudate.  Eyes: Conjunctivae are normal. Right eye exhibits no discharge. Left eye exhibits no discharge. No scleral icterus.  Neck: Normal range of motion. Neck supple.  Cardiovascular: Normal rate, regular rhythm, normal heart sounds and intact distal pulses.   Pulmonary/Chest: Effort normal and breath sounds normal. No respiratory distress. No wheezes. No rales.  Abdominal: Soft. Bowel sounds are normal. Exhibits no distension and no mass. There is no tenderness.  Urostomy bag present with normal clear urine. Musculoskeletal: Normal range of motion. Exhibits no edema.  No CVA tenderness on left or right. Lymphadenopathy:    No cervical adenopathy.  Neurological: Alert and oriented to person, place, and time. Exhibits normal muscle tone. Gait normal. Coordination normal.  Skin: Skin is warm and dry. No rash noted. Not diaphoretic. No erythema. No pallor.  Psychiatric: Mood, memory and judgment  normal.  Vitals reviewed.  LABORATORY DATA: Lab Results  Component Value Date   WBC 6.8 06/07/2022   HGB 14.0 06/07/2022   HCT 42.5 06/07/2022   MCV 85.2 06/07/2022   PLT 273 06/07/2022      Chemistry      Component Value Date/Time   NA 137 06/07/2022 0915   NA 141 11/02/2021 1512   K 3.8 06/07/2022 0915   CL 106 06/07/2022 0915   CO2 25 06/07/2022 0915   BUN 23 (H) 06/07/2022 0915   BUN 23 11/02/2021 1512   CREATININE 1.72 (H) 06/07/2022 0915   CREATININE 1.05 11/25/2014 0826      Component Value Date/Time   CALCIUM 9.6 06/07/2022 0915   ALKPHOS 61 06/07/2022 0915   AST 14 (L) 06/07/2022 0915   ALT 9 06/07/2022 0915   BILITOT 0.5 06/07/2022 0915  RADIOGRAPHIC STUDIES:  No results found.   ASSESSMENT/PLAN:  61 year old man with:  1.  Bladder cancer diagnosed in 2022. He was found to have stage IV high-grade urothelial carcinoma with pelvic adenopathy diagnosed in 2022.   He continues to be on Pembrolizumab without any major complications.  Per Dr.Shadad's last note, Padcev versus FGFR targeted therapy could be considered upon progression of disease.  Plan is to update his staging scan after today.  His CT is scheduled for 06/17/22. Local radiation to enlarging lymph node could be considered if it is an isolated area of metastasis.  After discussion today he is agreeable to proceed with Pembrolizumab today. He will have CT scan on June 17, 2022 and a follow-up visit the following week with Dr. Alen Blew to review the results.  The patient was given oral contrast today.   2.  IV access: Port-A-Cath remains in use without any issues.   3.  Antiemetics: No nausea or vomiting reported at this time.  Compazine is available to him.   4.  Renal function surveillance: His creatinine clearance around 45 cc/min today.  The patient is well-appearing today and I feel he can hydrate p.o. at home appropriately but we will offer him half to 1 L of fluid given the slight bump in  his creatinine compared to his baseline, although his creatinine has improved compared to Monday, 06/03/2022.  He elected to receive 1/2 a liter of fluids with his treatment today.   5.  Goals of care: Therapy remains palliative though aggressive measures are warranted.   6.  Immune mediated complications: He has not experienced any complications at this time.  Pneumonitis, hepatitis, hypophysitis among others.  TSH was normal based on last labs on 04/26/22 and will be repeated today.     7.  Recurrent urinary tract infection:  He was recently treated for urinary tract infection on 06/03/2022 and received Rocephin as well as Keflex.  He tells me his fevers, back pain, malodorous urine, and nausea has improved at this time.  No leukocytosis on labs.  The patient is well-appearing today.  A repeat urinalysis and culture was performed this morning and results are pending.  Do not feel strongly that the patient requires additional antibiotics unless the results are suspicious.  I also discussed in the future should he develop any recurrent urinary tract infection symptoms that he should also reach out to Dr. Zettie Pho office.   Addendum: His UA shows now negative nitrites and rare bacteria which is improved compared to prior. Continues to have WBC and leukocytes. Given the improving clinical symptoms and improving UA, recommend he continue with his course of antibiotics as prescribed. If any new or worsening symptoms, advised to seek medical evaluation.      8.  Follow-up: The patient was given oral contrast for his scan which is scheduled on 06/17/2022.  We will follow-up with Dr. Alen Blew a few days later on 06/21/2022 to review the results and next steps.   No orders of the defined types were placed in this encounter.    The total time spent in the appointment was 20-29 minutes.   Kirubel Aja L Rosalea Withrow, PA-C 06/07/22

## 2022-06-03 NOTE — Telephone Encounter (Signed)
-----   Message from Wyatt Portela, MD sent at 06/03/2022 10:27 AM EDT ----- Yes. He needs UA as well. He could have a UTI ----- Message ----- From: Rolene Course, RN Sent: 06/03/2022  10:21 AM EDT To: Wyatt Portela, MD  This patient's wife called this morning, she says he has been having some sx's for the last 10 days - nausea, low grade fever, extreme fatigue, no appetite, "something not right."  They are wary of him becoming extremely sick like he was a few months ago.  What do you think of him coming into [facility]?

## 2022-06-03 NOTE — Progress Notes (Signed)
Symptom Management Consult note [place]    Patient Care Team: Coral Spikes, DO as PCP - General (Family Medicine)    Name of the patient: Egidio Lofgren  [id number]  02/02/1961   Date of visit: 06/03/2022    Chief complaint/ Reason for visit- fatigue, fever  Oncology History  Bladder cancer ([place])  03/06/2020 Initial Diagnosis   Bladder cancer ([place])   05/18/2021 Cancer Staging   Staging form: Urinary Bladder, AJCC 8th Edition - Clinical: Stage IVB (cT4, cN2, cM1b) - Signed by Wyatt Portela, MD on 05/18/2021 WHO/ISUP grade (low/high): High Grade Histologic grading system: 2 grade system   06/08/2021 - 08/17/2021 Chemotherapy   Patient is on Treatment Plan : BLADDER Cisplatin D1 + Gemcitabine D1,8 q21d x 6 Cycles     09/07/2021 -  Chemotherapy   Patient is on Treatment Plan : HEAD/NECK Pembrolizumab        Current Therapy: Beryle Flock  Last treatment: 04/26/22  Interval history- XZAYVIER FAGIN is a 61 y.o. with oncologic history as above presenting to [facility] today with chief complaint of fatigue, decreased appetite, nausea, low-grade fever x10 days.  Patient states he typically feels this way when he has a urinary tract infection. His Tmax has been 100.1 x 3 days ago.  He has had normal urine output and his urostomy bag.  He has had decreased p.o. intake over the last several days because he feels unwell.  He denies any sick contacts.  No over-the-counter medications today prior to arrival.  He denies being in any pain.  His significant other accompanies him to the clinic today and provides additional history.  Patient has had multiple hospital admissions for UTI and sepsis in the past.  He states he was prescribed Keflex by radiation oncologist to have on hand if he was feeling symptomatic because of the frequent urinary tract infections.  He states he has not had to take any Keflex in several months.  He denies chills, congestion, chest pain, cough, shortness of  breath, abdominal pain, back pain, nausea, vomiting, gross hematuria, diarrhea.  Patient states he changes his urostomy bag every Wednesday and Sunday, it was changed yesterday.  Significant other denies any foul odor when changing in the bag.     ROS  All other systems are reviewed and are negative for acute change except as noted in the HPI.    Allergies  Allergen Reactions   Ace Inhibitors Cough   Diflucan [Fluconazole] Other (See Comments)    Irritated ulcers     Past Medical History:  Diagnosis Date   Cancer ([place])    bladder   Cataract    COVID-19 11/13/2019   h/a, chills, fatigue, loss of taste and smell, all symptoms resolved in a few weeks   Frequent urination    Fuchs' corneal dystrophy    Full dentures    GERD (gastroesophageal reflux disease)    Hypertension    Rheumatoid arthritis ([place])      Past Surgical History:  Procedure Laterality Date   CORNEAL TRANSPLANT Left 2013   CYSTOSCOPY WITH BIOPSY N/A 09/14/2019   Procedure: CYSTOSCOPY WITH BIOPSY;  Surgeon: Franchot Gallo, MD;  Location: [facility];  Service: Urology;  Laterality: N/A;   CYSTOSCOPY WITH INJECTION N/A 11/29/2020   Procedure: CYSTOSCOPY WITH INJECTION OF INDOCYANINE GREEN DYE;  Surgeon: Alexis Frock, MD;  Location: [facility];  Service: Urology;  Laterality: N/A;  6 HRS   FULGURATION OF BLADDER TUMOR N/A 09/14/2019  Procedure: FULGURATION OF BLADDER TUMOR;  Surgeon: Franchot Gallo, MD;  Location: [facility];  Service: Urology;  Laterality: N/A;   IR IMAGING GUIDED PORT INSERTION  06/07/2021   IR NEPHROSTOMY EXCHANGE LEFT  10/30/2020   IR NEPHROSTOMY EXCHANGE LEFT  11/19/2020   IR NEPHROSTOMY EXCHANGE RIGHT  10/30/2020   IR NEPHROSTOMY EXCHANGE RIGHT  11/19/2020   IR NEPHROSTOMY PLACEMENT LEFT  09/27/2020   IR NEPHROSTOMY PLACEMENT RIGHT  09/27/2020   ROBOT ASSISTED LAPAROSCOPIC COMPLETE CYSTECT ILEAL CONDUIT N/A 11/29/2020   Procedure: XI ROBOTIC ASSISTED LAPAROSCOPIC COMPLETE CYSTECT ILEAL  CONDUIT/RADICAL PROSTATECTOMY/LYMPHADENECTOMY;  Surgeon: Alexis Frock, MD;  Location: [facility];  Service: Urology;  Laterality: N/A;   TRANSURETHRAL RESECTION OF BLADDER TUMOR WITH MITOMYCIN-C N/A 08/05/2019   Procedure: TRANSURETHRAL RESECTION OF BLADDER TUMOR;  Surgeon: Franchot Gallo, MD;  Location: [facility];  Service: Urology;  Laterality: N/A;  1 HR   TRANSURETHRAL RESECTION OF BLADDER TUMOR WITH MITOMYCIN-C N/A 04/24/2020   Procedure: TRANSURETHRAL RESECTION OF BLADDER TUMOR WITH GEMCIDABINE;  Surgeon: Franchot Gallo, MD;  Location: [facility];  Service: Urology;  Laterality: N/A;    Social History   Socioeconomic History   Marital status: Married    Spouse name: Not on file   Number of children: Not on file   Years of education: Not on file   Highest education level: Not on file  Occupational History   Not on file  Tobacco Use   Smoking status: Former    Packs/day: 0.50    Years: 20.00    Total pack years: 10.00    Types: Cigarettes   Smokeless tobacco: Never   Tobacco comments:    quit 2010  Vaping Use   Vaping Use: Never used  Substance and Sexual Activity   Alcohol use: Yes    Comment: occ  2 x month   Drug use: No   Sexual activity: Yes  Other Topics Concern   Not on file  Social History Narrative   Not on file   Social Determinants of Health   Financial Resource Strain: Not on file  Food Insecurity: Not on file  Transportation Needs: Not on file  Physical Activity: Not on file  Stress: Not on file  Social Connections: Not on file  Intimate Partner Violence: Not on file    No family history on file.   Current Outpatient Medications:    acetaminophen (TYLENOL) 650 MG CR tablet, Take 1,300 mg by mouth every morning., Disp: , Rfl:    amLODipine (NORVASC) 10 MG tablet, TAKE 1 TABLET(10 MG) BY MOUTH DAILY, Disp: 90 tablet, Rfl: 1   cephALEXin (KEFLEX) 500 MG capsule, Take 1 capsule (500 mg total) by mouth 4 (four)  times daily., Disp: 28 capsule, Rfl: 1   diphenoxylate-atropine (LOMOTIL) 2.5-0.025 MG tablet, Take 1 tablet by mouth 4 (four) times daily as needed for diarrhea or loose stools., Disp: 30 tablet, Rfl: 0   feeding supplement (ENSURE ENLIVE / ENSURE PLUS) LIQD, Take 237 mLs by mouth 2 (two) times daily between meals., Disp: 237 mL, Rfl: 12   lidocaine-prilocaine (EMLA) cream, Apply 1 application topically as needed., Disp: 30 g, Rfl: 0   Multiple Vitamin (MULTIVITAMIN WITH MINERALS) TABS tablet, Take 1 tablet by mouth daily., Disp: , Rfl:    omeprazole (PRILOSEC) 40 MG capsule, Take 40 mg by mouth daily., Disp: , Rfl:    oseltamivir (TAMIFLU) 75 MG capsule, Take 1 capsule (75 mg total) by mouth every 12 (twelve) hours., Disp:  10 capsule, Rfl: 0   prednisoLONE acetate (PRED FORTE) 1 % ophthalmic suspension, Place 1 drop into the left eye 2 (two) times a week. Instill 1 drop into left eye Monday and Friday., Disp: , Rfl:    prochlorperazine (COMPAZINE) 10 MG tablet, TAKE 1 TABLET(10 MG) BY MOUTH EVERY 6 HOURS AS NEEDED FOR NAUSEA OR VOMITING, Disp: 90 tablet, Rfl: 2   rosuvastatin (CRESTOR) 20 MG tablet, TAKE 1 TABLET(20 MG) BY MOUTH DAILY, Disp: 90 tablet, Rfl: 1   sertraline (ZOLOFT) 50 MG tablet, TAKE 1 TABLET(50 MG) BY MOUTH DAILY, Disp: 45 tablet, Rfl: 1 No current facility-administered medications for this visit.  Facility-Administered Medications Ordered in Other Visits:    acetaminophen (TYLENOL) 325 MG tablet, , , ,    cefTRIAXone (ROCEPHIN) 1 g in dextrose 5 % 50 mL IVPB, 1 g, Intravenous, Q24H, Walisiewicz, Sheriece Jefcoat E, PA-C, Stopped at 06/03/22 1619   diphenhydrAMINE (BENADRYL) 25 mg capsule, , , ,    gemcitabine (GEMZAR) chemo syringe for bladder instillation 2,000 mg, 2,000 mg, Bladder Instillation, Once, Franchot Gallo, MD   gemcitabine [facility]) chemo syringe for bladder instillation 2,000 mg, 2,000 mg, Bladder Instillation, Once, Franchot Gallo, MD  PHYSICAL EXAM: ECOG FS:1 -  Symptomatic but completely ambulatory    Vitals:   06/03/22 1347 06/03/22 1627  BP: 100/79 106/79  Pulse: 83 76  Resp: 18 17  Temp: 97.9 F (36.6 C) 97.9 F (36.6 C)  TempSrc: Oral Oral  SpO2: 99% 98%   Physical Exam Vitals and nursing note reviewed.  Constitutional:      Appearance: He is well-developed. He is not ill-appearing or toxic-appearing.  HENT:     Head: Normocephalic and atraumatic.     Nose: Nose normal.  Eyes:     General: No scleral icterus.       Right eye: No discharge.        Left eye: No discharge.     Conjunctiva/sclera: Conjunctivae normal.  Neck:     Vascular: No JVD.  Cardiovascular:     Rate and Rhythm: Normal rate and regular rhythm.     Pulses: Normal pulses.     Heart sounds: Normal heart sounds.  Pulmonary:     Effort: Pulmonary effort is normal.     Breath sounds: Normal breath sounds.  Abdominal:     General: Bowel sounds are normal. There is no distension.     Palpations: Abdomen is soft. There is no mass.     Tenderness: There is no abdominal tenderness. There is no right CVA tenderness, left CVA tenderness, guarding or rebound.     Hernia: No hernia is present.     Comments: Urostomy bag present with normal colored urine  Musculoskeletal:        General: Normal range of motion.     Cervical back: Normal range of motion.  Skin:    General: Skin is warm and dry.  Neurological:     Mental Status: He is oriented to person, place, and time.     GCS: GCS eye subscore is 4. GCS verbal subscore is 5. GCS motor subscore is 6.     Comments: Fluent speech, no facial droop.  Psychiatric:        Behavior: Behavior normal.        LABORATORY DATA: I have reviewed the data as listed    Latest Ref Rng & Units 06/03/2022    1:10 PM 04/26/2022    1:48 PM 03/15/2022   12:51 PM  CBC  WBC 4.0 - 10.5 K/uL 7.1  9.7  8.4   Hemoglobin 13.0 - 17.0 g/dL 13.9  13.7  13.4   Hematocrit 39.0 - 52.0 % 41.6  42.0  41.3   Platelets 150 - 400 K/uL 210  279   198         Latest Ref Rng & Units 06/03/2022    1:10 PM 04/26/2022    1:48 PM 03/15/2022   12:51 PM  CMP  Glucose 70 - 99 mg/dL 91  95  96   BUN 6 - 20 mg/dL 25  35  24   Creatinine 0.61 - 1.24 mg/dL 1.88  1.60  1.65   Sodium 135 - 145 mmol/L 137  140  139   Potassium 3.5 - 5.1 mmol/L 3.8  3.7  4.0   Chloride 98 - 111 mmol/L 105  108  107   CO2 22 - 32 mmol/L '27  25  26   '$ Calcium 8.9 - 10.3 mg/dL 9.3  9.4  9.1   Total Protein 6.5 - 8.1 g/dL 7.1  7.1  7.2   Total Bilirubin 0.3 - 1.2 mg/dL 0.5  0.3  0.4   Alkaline Phos 38 - 126 U/L 69  67  78   AST 15 - 41 U/L '15  14  13   '$ ALT 0 - 44 U/L '12  18  11        '$ RADIOGRAPHIC STUDIES (from last 24 hours if applicable) I have personally reviewed the radiological images as listed and agreed with the findings in the report. No results found.     ASSESSMENT & PLAN: Patient is a 61 y.o. 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 Alen Blew.  I have viewed most recent oncology note and lab work.   #)fatigue, fever- Patient is afebrile here, hemodynamically stable.  He is very well-appearing.  He has urostomy bag with normal colored urine.  He has no abdominal tenderness or CVA tenderness.  CBC is overall unremarkable.  White count is normal.  CMP shows no significant electrolyte derangement.  He does have slight elevation of his creatinine at 1.88, 1 month ago it was 1.6 and his baseline appears to be around 1.4.  UA is concerning for infection with positive nitrites, small leukocytes, over 50 WBC and many bacteria.  This could be contamination from his urostomy bag however patient has felt this way in the past with urinary tract infections.  I engaged in shared decision making with patient and his significant other who are agreeable with plan to treat for UTI at this time.  Patient was given dose of Rocephin here in clinic.  Urine culture collected. He has a prescription at the  pharmacy for Keflex that was prescribed by his radiation oncologist.  He called to have this filled and will start taking it for the next 5 days. Patient given IVF in clinic as well. Kidney function can be rechecked at upcoming toxicity check.  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     Visit Diagnosis: 1. Urinary tract infection without hematuria, site unspecified   2. Port-A-Cath in place   3. Malignant neoplasm of trigone of urinary bladder (HCC)      No orders of the defined types were placed in this encounter.   All questions were answered. The patient knows to call the clinic with any problems, questions or concerns.  No barriers to learning was detected.  I have spent a total of 30 minutes minutes of face-to-face and non-face-to-face time, preparing to see the patient, obtaining and/or reviewing separately obtained history, performing a medically appropriate examination, counseling and educating the patient, ordering tests, documenting clinical information in the electronic health record, and care coordination (communications with other health care professionals or caregivers).    Thank you for allowing me to participate in the care of this patient.    Barrie Folk, PA-C Department of Hematology/Oncology [facility] Phone: [phone]  [phone]    06/03/2022 5:35 PM

## 2022-06-04 LAB — URINE CULTURE

## 2022-06-06 ENCOUNTER — Other Ambulatory Visit: Payer: Self-pay

## 2022-06-06 ENCOUNTER — Telehealth: Payer: Self-pay

## 2022-06-06 DIAGNOSIS — N390 Urinary tract infection, site not specified: Secondary | ICD-10-CM

## 2022-06-06 NOTE — Progress Notes (Signed)
Orders for urinalysis/ urine culture placed per verbal order Cassandra, PA.

## 2022-06-06 NOTE — Progress Notes (Signed)
Opened in error

## 2022-06-06 NOTE — Telephone Encounter (Signed)
RN spoke with patient's wife, Barnett Applebaum, regarding ongoing UTI symptoms. Per wife, patient is not feeling worse after antibiotics but is not feeling much better. She states patient usually feels better after IV antibiotics but does not seem like he is doing as well as usual. Wife requests that Cassie, PA, be made aware since he will be seeing her tomorrow 7/21 for MD visit.

## 2022-06-06 NOTE — Addendum Note (Signed)
Addended by: Thyra Breed E on: 06/06/2022 09:55 AM   Modules accepted: Orders

## 2022-06-07 ENCOUNTER — Inpatient Hospital Stay (HOSPITAL_BASED_OUTPATIENT_CLINIC_OR_DEPARTMENT_OTHER): Payer: BC Managed Care – PPO | Admitting: Physician Assistant

## 2022-06-07 ENCOUNTER — Inpatient Hospital Stay: Payer: BC Managed Care – PPO

## 2022-06-07 ENCOUNTER — Other Ambulatory Visit: Payer: Self-pay

## 2022-06-07 ENCOUNTER — Other Ambulatory Visit (HOSPITAL_COMMUNITY): Payer: BC Managed Care – PPO

## 2022-06-07 VITALS — BP 105/77 | HR 98 | Temp 97.5°F | Resp 15 | Wt 215.1 lb

## 2022-06-07 DIAGNOSIS — C670 Malignant neoplasm of trigone of bladder: Secondary | ICD-10-CM | POA: Diagnosis not present

## 2022-06-07 DIAGNOSIS — C679 Malignant neoplasm of bladder, unspecified: Secondary | ICD-10-CM | POA: Diagnosis not present

## 2022-06-07 DIAGNOSIS — Z95828 Presence of other vascular implants and grafts: Secondary | ICD-10-CM

## 2022-06-07 DIAGNOSIS — N390 Urinary tract infection, site not specified: Secondary | ICD-10-CM

## 2022-06-07 LAB — CBC WITH DIFFERENTIAL (CANCER CENTER ONLY)
Abs Immature Granulocytes: 0.01 10*3/uL (ref 0.00–0.07)
Basophils Absolute: 0.1 10*3/uL (ref 0.0–0.1)
Basophils Relative: 1 %
Eosinophils Absolute: 0.4 10*3/uL (ref 0.0–0.5)
Eosinophils Relative: 6 %
HCT: 42.5 % (ref 39.0–52.0)
Hemoglobin: 14 g/dL (ref 13.0–17.0)
Immature Granulocytes: 0 %
Lymphocytes Relative: 26 %
Lymphs Abs: 1.8 10*3/uL (ref 0.7–4.0)
MCH: 28.1 pg (ref 26.0–34.0)
MCHC: 32.9 g/dL (ref 30.0–36.0)
MCV: 85.2 fL (ref 80.0–100.0)
Monocytes Absolute: 0.6 10*3/uL (ref 0.1–1.0)
Monocytes Relative: 9 %
Neutro Abs: 4 10*3/uL (ref 1.7–7.7)
Neutrophils Relative %: 58 %
Platelet Count: 273 10*3/uL (ref 150–400)
RBC: 4.99 MIL/uL (ref 4.22–5.81)
RDW: 14.5 % (ref 11.5–15.5)
WBC Count: 6.8 10*3/uL (ref 4.0–10.5)
nRBC: 0 % (ref 0.0–0.2)

## 2022-06-07 LAB — URINALYSIS, COMPLETE (UACMP) WITH MICROSCOPIC
Bilirubin Urine: NEGATIVE
Glucose, UA: NEGATIVE mg/dL
Ketones, ur: NEGATIVE mg/dL
Nitrite: NEGATIVE
Protein, ur: NEGATIVE mg/dL
Specific Gravity, Urine: 1.013 (ref 1.005–1.030)
pH: 5 (ref 5.0–8.0)

## 2022-06-07 LAB — CMP (CANCER CENTER ONLY)
ALT: 9 U/L (ref 0–44)
AST: 14 U/L — ABNORMAL LOW (ref 15–41)
Albumin: 3.9 g/dL (ref 3.5–5.0)
Alkaline Phosphatase: 61 U/L (ref 38–126)
Anion gap: 6 (ref 5–15)
BUN: 23 mg/dL — ABNORMAL HIGH (ref 6–20)
CO2: 25 mmol/L (ref 22–32)
Calcium: 9.6 mg/dL (ref 8.9–10.3)
Chloride: 106 mmol/L (ref 98–111)
Creatinine: 1.72 mg/dL — ABNORMAL HIGH (ref 0.61–1.24)
GFR, Estimated: 45 mL/min — ABNORMAL LOW (ref >60)
Glucose, Bld: 88 mg/dL (ref 70–99)
Potassium: 3.8 mmol/L (ref 3.5–5.1)
Sodium: 137 mmol/L (ref 135–145)
Total Bilirubin: 0.5 mg/dL (ref 0.3–1.2)
Total Protein: 7 g/dL (ref 6.5–8.1)

## 2022-06-07 LAB — TSH: TSH: 2.422 u[IU]/mL (ref 0.350–4.500)

## 2022-06-07 MED ORDER — SODIUM CHLORIDE 0.9 % IV SOLN: Freq: Once | INTRAVENOUS | Status: AC

## 2022-06-07 MED ORDER — SODIUM CHLORIDE 0.9 % IV SOLN
400.0000 mg | Freq: Once | INTRAVENOUS | Status: AC
  Administered 2022-06-07: 400 mg via INTRAVENOUS
  Filled 2022-06-07: qty 16

## 2022-06-07 MED ORDER — SODIUM CHLORIDE 0.9% FLUSH: 10.0000 mL | Freq: Once | INTRAVENOUS | Status: DC

## 2022-06-07 MED ORDER — HEPARIN SOD (PORK) LOCK FLUSH 100 UNIT/ML IV SOLN
500.0000 [IU] | Freq: Once | INTRAVENOUS | Status: AC | PRN
  Administered 2022-06-07: 500 [IU]

## 2022-06-07 MED ORDER — SODIUM CHLORIDE 0.9% FLUSH
10.0000 mL | INTRAVENOUS | Status: DC | PRN
  Administered 2022-06-07: 10 mL

## 2022-06-07 NOTE — Progress Notes (Signed)
Per Cassie Heillingoetter, PA okay to treat with SCr 1.72 mg/dL.

## 2022-06-07 NOTE — Patient Instructions (Addendum)
[facility]COL[facility]  Discharge Instructions: Thank you for choosing [facility] to provide your oncology and hematology care.   If you have a lab appointment with the [facility], please go directly to the [facility] and check in at the registration area.   Wear comfortable clothing and clothing appropriate for easy access to any Portacath or PICC line.   We strive to give you quality time with your provider. You may need to reschedule your appointment if you arrive late (15 or more minutes).  Arriving late affects you and other patients whose appointments are after yours.  Also, if you miss three or more appointments without notifying the office, you may be dismissed from the clinic at the provider's discretion.      For prescription refill requests, have your pharmacy contact our office and allow 72 hours for refills to be completed.    Today you received the following chemotherapy and/or immunotherapy agents Keytruda      To help prevent nausea and vomiting after your treatment, we encourage you to take your nausea medication as directed.  BELOW ARE SYMPTOMS THAT SHOULD BE REPORTED IMMEDIATELY: *FEVER GREATER THAN 100.4 F (38 C) OR HIGHER *CHILLS OR SWEATING *NAUSEA AND VOMITING THAT IS NOT CONTROLLED WITH YOUR NAUSEA MEDICATION *UNUSUAL SHORTNESS OF BREATH *UNUSUAL BRUISING OR BLEEDING *URINARY PROBLEMS (pain or burning when urinating, or frequent urination) *BOWEL PROBLEMS (unusual diarrhea, constipation, pain near the anus) TENDERNESS IN MOUTH AND THROAT WITH OR WITHOUT PRESENCE OF ULCERS (sore throat, sores in mouth, or a toothache) UNUSUAL RASH, SWELLING OR PAIN  UNUSUAL VAGINAL DISCHARGE OR ITCHING   Items with * indicate a potential emergency and should be followed up as soon as possible or go to the Emergency Department if any problems should occur.  Please show the CHEMOTHERAPY ALERT CARD or IMMUNOTHERAPY ALERT CARD at check-in to  the Emergency Department and triage nurse.  Should you have questions after your visit or need to cancel or reschedule your appointment, please contact [facility]  Dept: [phone]  and follow the prompts.  Office hours are 8:00 a.m. to 4:30 p.m. Monday - Friday. Please note that voicemails left after 4:00 p.m. may not be returned until the following business day.  We are closed weekends and major holidays. You have access to a nurse at all times for urgent questions. Please call the main number to the clinic Dept: [phone] and follow the prompts.   For any non-urgent questions, you may also contact your provider using MyChart. We now offer e-Visits for anyone 24 and older to request care online for non-urgent symptoms. For details visit [email or web address].   Also download the MyChart app! Go to the app store, search "MyChart", open the app, select [facility], and log in with your MyChart username and password.  Masks are optional in the cancer centers. If you would like for your care team to wear a mask while they are taking care of you, please let them know. For doctor visits, patients may have with them one support person who is at least 60 years old. At this time, visitors are not allowed in the infusion area. Pembrolizumab injection What is this medication? PEMBROLIZUMAB (pem broe liz ue mab) is a monoclonal antibody. It is used to treat certain types of cancer. This medicine may be used for other purposes; ask your health care provider or pharmacist if you have questions. COMMON BRAND NAME(S): Keytruda What should I  tell my care team before I take this medication? They need to know if you have any of these conditions: autoimmune diseases like Crohn's disease, ulcerative colitis, or lupus have had or planning to have an allogeneic stem cell transplant (uses someone else's stem cells) history of organ transplant history of chest radiation nervous  system problems like myasthenia gravis or Guillain-Barre syndrome an unusual or allergic reaction to pembrolizumab, other medicines, foods, dyes, or preservatives pregnant or trying to get pregnant breast-feeding How should I use this medication? This medicine is for infusion into a vein. It is given by a health care professional in a hospital or clinic setting. A special MedGuide will be given to you before each treatment. Be sure to read this information carefully each time. Talk to your pediatrician regarding the use of this medicine in children. While this drug may be prescribed for children as young as 6 months for selected conditions, precautions do apply. Overdosage: If you think you have taken too much of this medicine contact a poison control center or emergency room at once. NOTE: This medicine is only for you. Do not share this medicine with others. What if I miss a dose? It is important not to miss your dose. Call your doctor or health care professional if you are unable to keep an appointment. What may interact with this medication? Interactions have not been studied. This list may not describe all possible interactions. Give your health care provider a list of all the medicines, herbs, non-prescription drugs, or dietary supplements you use. Also tell them if you smoke, drink alcohol, or use illegal drugs. Some items may interact with your medicine. What should I watch for while using this medication? Your condition will be monitored carefully while you are receiving this medicine. You may need blood work done while you are taking this medicine. Do not become pregnant while taking this medicine or for 4 months after stopping it. Women should inform their doctor if they wish to become pregnant or think they might be pregnant. There is a potential for serious side effects to an unborn child. Talk to your health care professional or pharmacist for more information. Do not breast-feed an  infant while taking this medicine or for 4 months after the last dose. What side effects may I notice from receiving this medication? Side effects that you should report to your doctor or health care professional as soon as possible: allergic reactions like skin rash, itching or hives, swelling of the face, lips, or tongue bloody or black, tarry breathing problems changes in vision chest pain chills confusion constipation cough diarrhea dizziness or feeling faint or lightheaded fast or irregular heartbeat fever flushing joint pain low blood counts - this medicine may decrease the number of white blood cells, red blood cells and platelets. You may be at increased risk for infections and bleeding. muscle pain muscle weakness pain, tingling, numbness in the hands or feet persistent headache redness, blistering, peeling or loosening of the skin, including inside the mouth signs and symptoms of high blood sugar such as dizziness; dry mouth; dry skin; fruity breath; nausea; stomach pain; increased hunger or thirst; increased urination signs and symptoms of kidney injury like trouble passing urine or change in the amount of urine signs and symptoms of liver injury like dark urine, light-colored stools, loss of appetite, nausea, right upper belly pain, yellowing of the eyes or skin sweating swollen lymph nodes weight loss Side effects that usually do not require medical attention (report  to your doctor or health care professional if they continue or are bothersome): decreased appetite hair loss tiredness This list may not describe all possible side effects. Call your doctor for medical advice about side effects. You may report side effects to FDA at 1-800-FDA-1088. Where should I keep my medication? This drug is given in a hospital or clinic and will not be stored at home. NOTE: This sheet is a summary. It may not cover all possible information. If you have questions about this medicine,  talk to your doctor, pharmacist, or health care provider.  2023 Elsevier/Gold Standard (2021-10-05 00:00:00)

## 2022-06-09 LAB — URINE CULTURE: Culture: 60000 — AB

## 2022-06-10 ENCOUNTER — Other Ambulatory Visit: Payer: Self-pay

## 2022-06-10 ENCOUNTER — Other Ambulatory Visit: Payer: Self-pay | Admitting: Physician Assistant

## 2022-06-10 DIAGNOSIS — N390 Urinary tract infection, site not specified: Secondary | ICD-10-CM

## 2022-06-10 MED ORDER — CIPROFLOXACIN HCL 500 MG PO TABS: 500.0000 mg | ORAL_TABLET | Freq: Two times a day (BID) | ORAL | 0 refills | Status: DC

## 2022-06-10 NOTE — Progress Notes (Signed)
I called the patient to review his urine culture results. His culture grew Pseudomonas. I reviewed the dose with the pharmacy who informed me his creatinine clearance is >60 and no dosing adjustments for renal function is needed. I have sent 500 mg BID to his pharmacy for 7 days. I also encouraged him to drink plenty of fluids. He expressed understanding.

## 2022-06-11 ENCOUNTER — Telehealth: Payer: Self-pay | Admitting: *Deleted

## 2022-06-11 NOTE — Telephone Encounter (Signed)
-----   Message from [employer], PA-C sent at 06/11/2022 11:14 AM EDT ----- Regarding: RE: He can take prednisone if needed but in general with people on immunotherapy we prefer people to be on 10 mg of less of prednisone if absolutely needed just because it reverses the effect of immunotherapy. Should be ok with the cipro. ----- Message ----- From: Rolene Course, RN Sent: 06/11/2022   9:22 AM EDT To: Tobe Sos Heilingoetter, PA-C  Hi Cassie,  This patient's wife called this morning, you recently prescribed cipro for him which he is taking.  He has recently had a RA flare which he usually takes prednisone for.  She is asking if he can take these two drugs together.  Please advise.  Thanks, Bethena Roys

## 2022-06-11 NOTE — Telephone Encounter (Signed)
Returned PC to patient's wife, Barnett Applebaum, no answer, left VM - informed her per C. Heilingoetter PA, it is ok for the patient to take his prednisone while on cipro, however, he should only take 10 mg of prednisone or less as it could interfere with his immunotherapy.  Instructed Barnett Applebaum to call our office with any questions/concerns, [phone].

## 2022-06-12 IMAGING — US US RENAL
1 series · 13 of 25 positions shown · non-contrast
Comparison: CT abdomen and pelvis September 23, 2020.

CLINICAL DATA: Acute kidney injury. Reported history of bladder
carcinoma

EXAM:
RENAL / URINARY TRACT ULTRASOUND COMPLETE

[Series 1: us renal · 0.26mm/px · 13 of 103 slices shown]
[im 1/103]
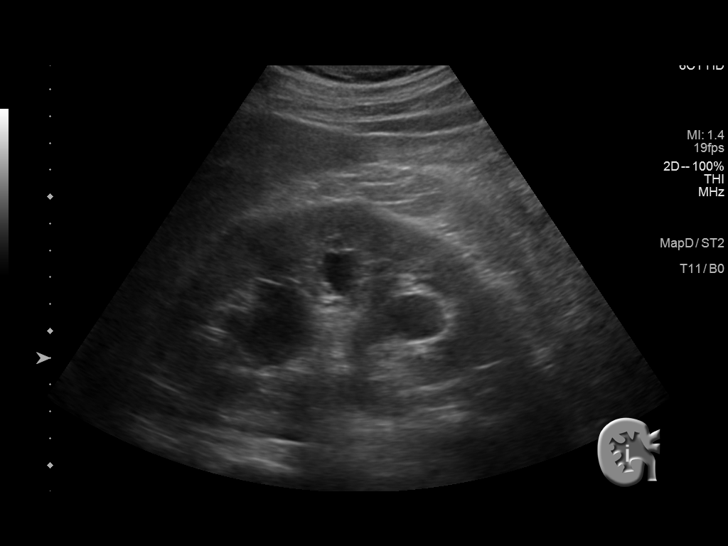
[im 9/103]
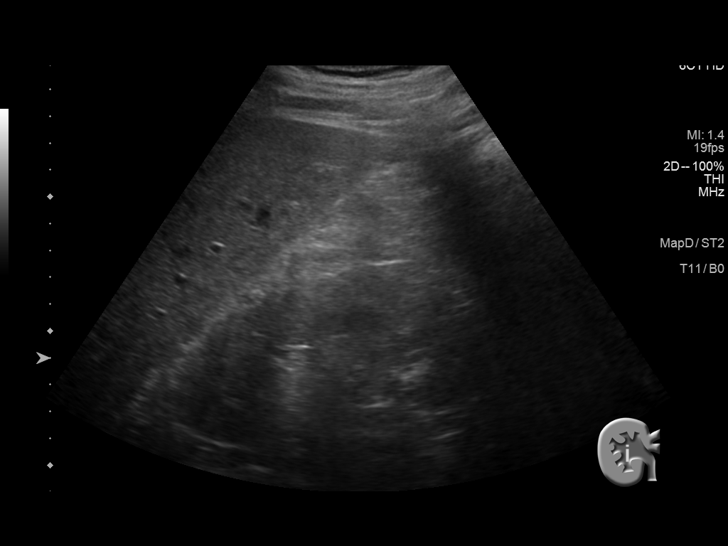
[im 18/103]
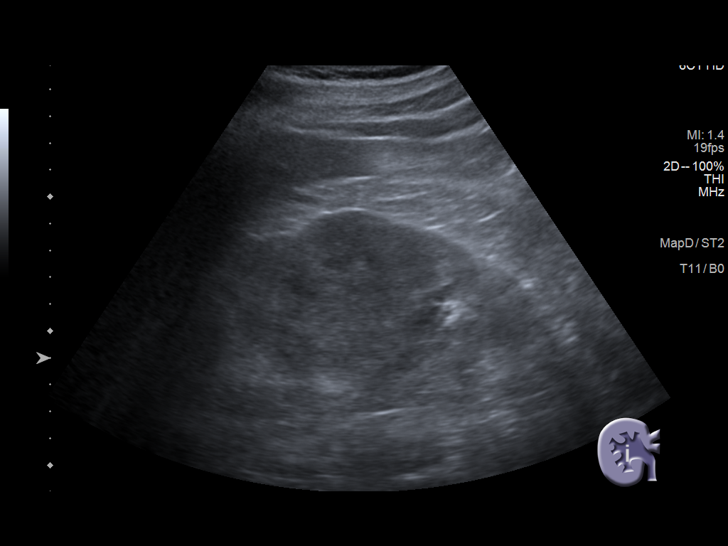
[im 26/103]
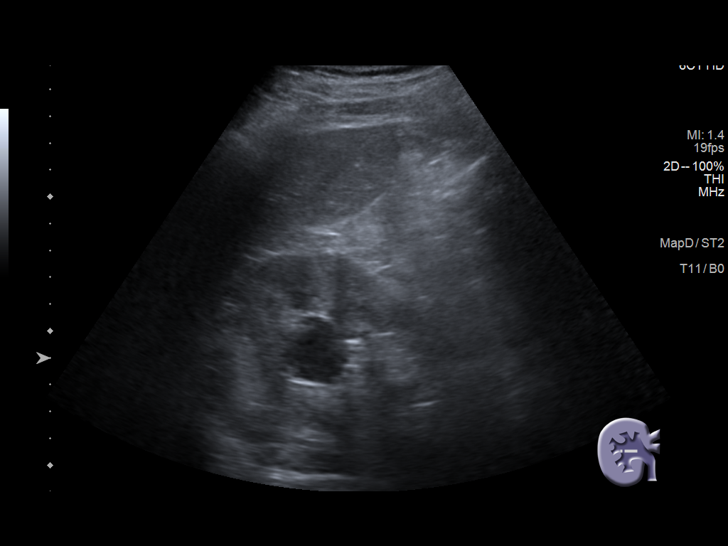
[im 35/103]
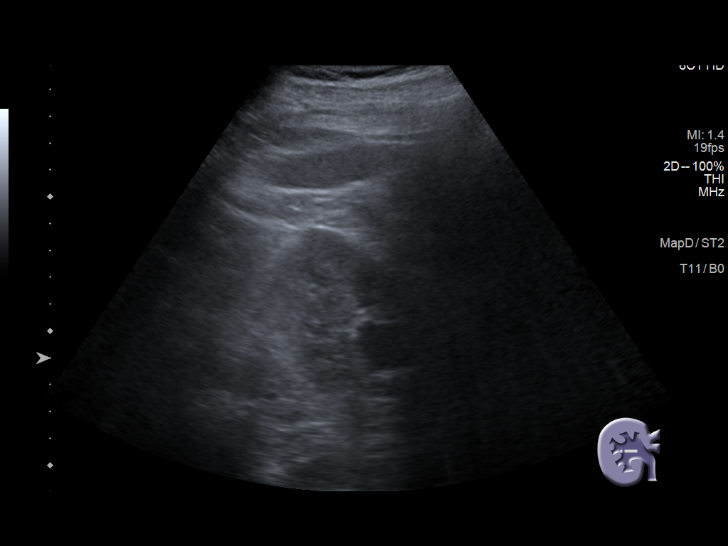
[im 43/103]
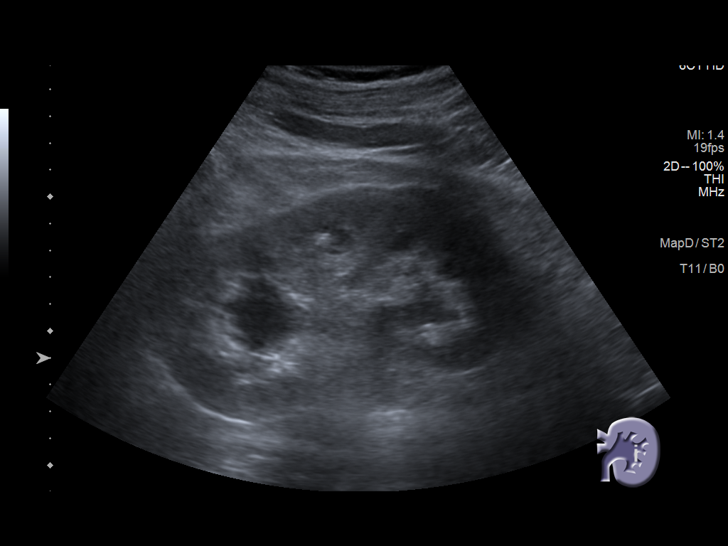
[im 52/103]
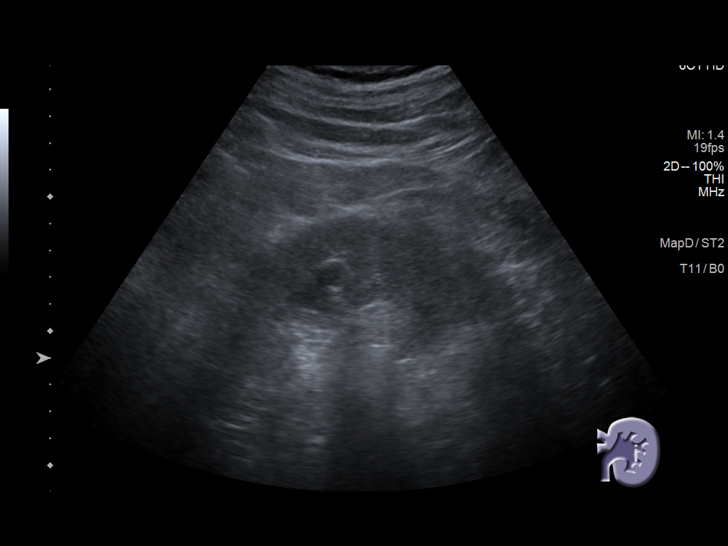
[im 60/103]
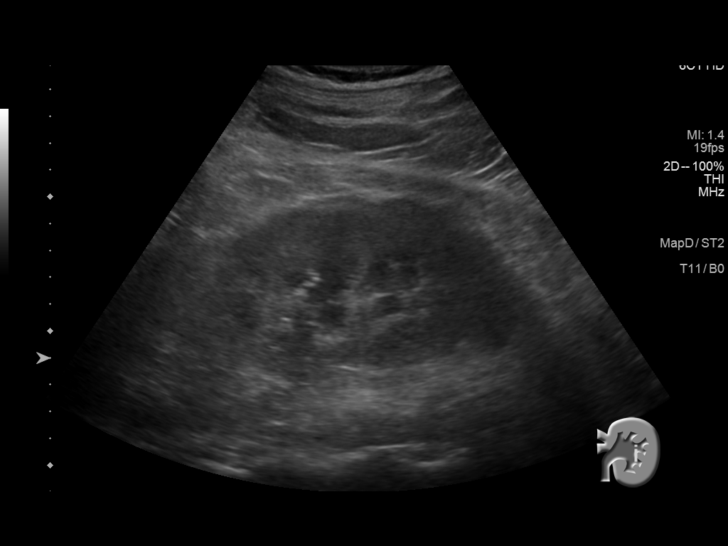
[im 69/103]
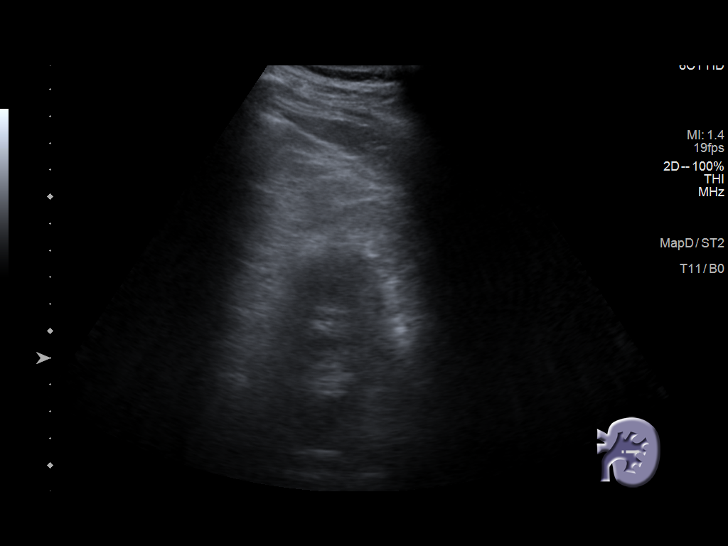
[im 77/103]
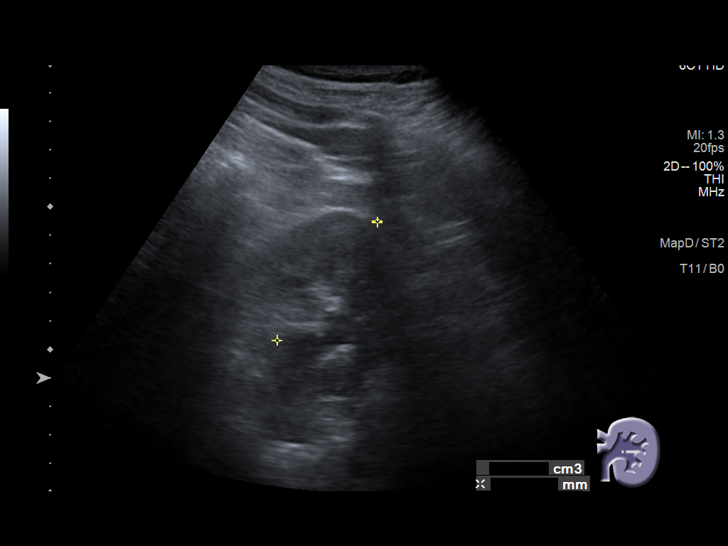
[im 86/103]
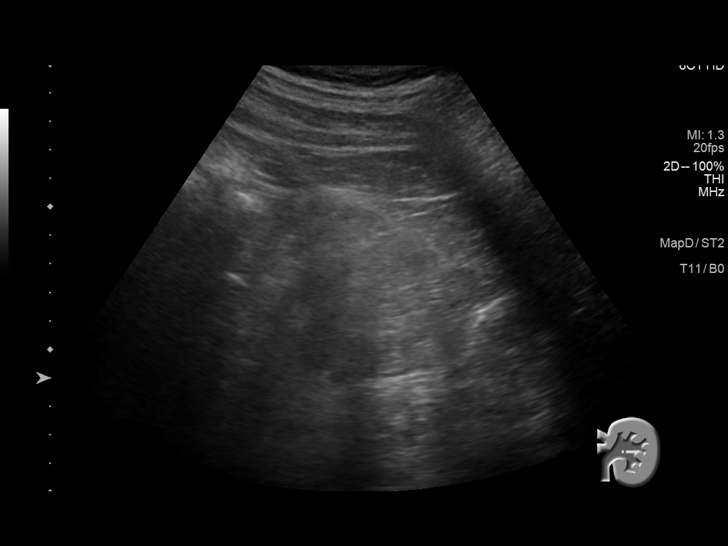
[im 94/103]
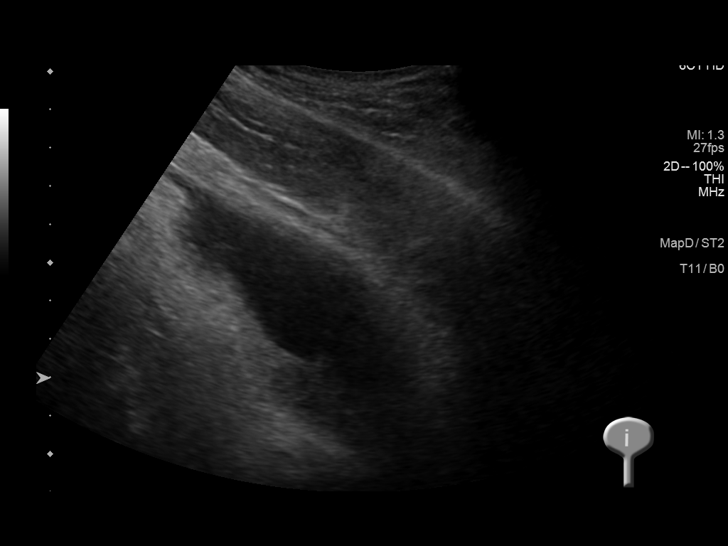
[im 103/103]
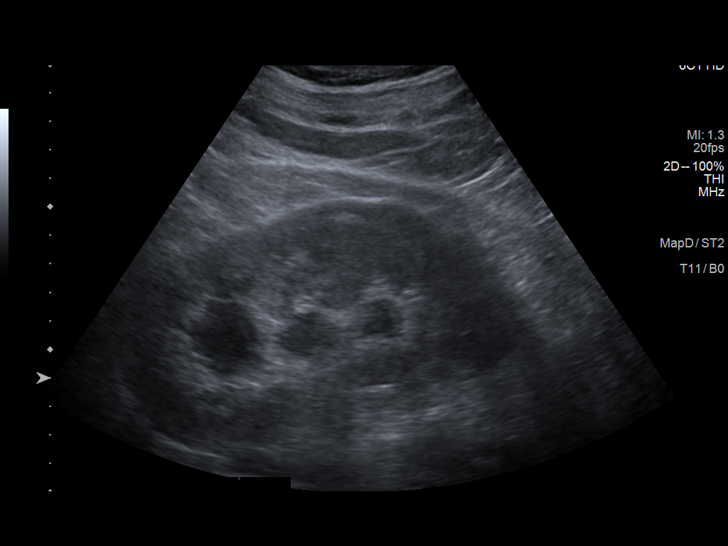

[13 of 25 positions shown; findings below may reference images not displayed]

FINDINGS: Right Kidney:

Renal measurements: 13.4 x 7.7 x 5.9 cm = volume: 322 mL.
Echogenicity is mildly increased. Renal cortical thickness is
normal. No mass or perinephric fluid visualized. There is moderate
hydronephrosis on the right. No sonographically demonstrable
calculus or appreciable ureterectasis.

Left Kidney:

Renal measurements: 13.6 x 7.6 x 5.4 cm = volume: 294 mL.
Echogenicity is mildly increased. Renal cortical thickness is
normal. No mass or perinephric fluid visualized. There is moderate
hydronephrosis on the left. No sonographically demonstrable calculus
or ureterectasis.

Bladder:

There is marked thickening of the wall of the urinary bladder,
consistent with findings on recent CT. A mass arising from the
posterior bladder is questioned.

Other:

None.
IMPRESSION: 1. Persistent moderate hydronephrosis bilaterally. Wall thickening
involving the posterior bladder with questionable mass in this area
may be causing impression on distal ureters and resulting in the
hydronephrosis. This circumstance may warrant direct visualization
of the bladder with retrograde assessment of the ureters and
collecting systems.

2. The echogenicity of each kidney is increased which may be
indicative of a degree of underlying medical renal disease. Renal
cortical thickness bilaterally is within normal limits.

These results will be called to the ordering clinician or
representative by the Radiologist Assistant, and communication
documented in the PACS or [REDACTED].

## 2022-06-14 ENCOUNTER — Ambulatory Visit (HOSPITAL_COMMUNITY): Payer: BC Managed Care – PPO

## 2022-06-17 ENCOUNTER — Ambulatory Visit (HOSPITAL_COMMUNITY)
Admission: RE | Admit: 2022-06-17 | Discharge: 2022-06-17 | Disposition: A | Discharge status: Home / Self Care | Payer: BC Managed Care – PPO | Source: Ambulatory Visit | Attending: Oncology | Admitting: Oncology

## 2022-06-17 DIAGNOSIS — C670 Malignant neoplasm of trigone of bladder: Secondary | ICD-10-CM | POA: Insufficient documentation

## 2022-06-17 MED ORDER — IOHEXOL 300 MG/ML  SOLN
80.0000 mL | Freq: Once | INTRAMUSCULAR | Status: AC | PRN
  Administered 2022-06-17: 80 mL via INTRAVENOUS

## 2022-06-18 ENCOUNTER — Other Ambulatory Visit: Payer: Self-pay | Admitting: *Deleted

## 2022-06-18 ENCOUNTER — Telehealth: Payer: Self-pay | Admitting: *Deleted

## 2022-06-18 DIAGNOSIS — N390 Urinary tract infection, site not specified: Secondary | ICD-10-CM

## 2022-06-18 MED ORDER — CIPROFLOXACIN HCL 500 MG PO TABS: 500.0000 mg | ORAL_TABLET | Freq: Two times a day (BID) | ORAL | 0 refills | Status: DC

## 2022-06-18 NOTE — Telephone Encounter (Signed)
-----   Message from Wyatt Portela, MD sent at 06/18/2022  3:17 PM EDT ----- Please refill his Cipro for 2 more weeks. Thanks ----- Message ----- From: Rolene Course, RN Sent: 06/18/2022   3:04 PM EDT To: Wyatt Portela, MD  This patient's wife called, he had a recent UTI dx'd here, was initially placed on Keflex & then Cassie changed his antibiotic to Cipro after urine culture results reviewed.  He completed the cipro course on Sunday, was feeling much better, & then the same sx's returned yesterday - no appetite, headache, fatigue.  He is asking if he should take another round of cipro.  Please advise  Thanks, Bethena Roys

## 2022-06-18 NOTE — Telephone Encounter (Signed)
Returned PC to patient's wife Barnett Applebaum, informed her rx has been sent for another round of cipro, 500 mg twice a day for two weeks.  She verbalizes understanding.

## 2022-06-19 ENCOUNTER — Other Ambulatory Visit: Payer: Self-pay | Admitting: *Deleted

## 2022-06-19 DIAGNOSIS — F419 Anxiety disorder, unspecified: Secondary | ICD-10-CM

## 2022-06-19 DIAGNOSIS — F32A Depression, unspecified: Secondary | ICD-10-CM

## 2022-06-19 MED ORDER — SERTRALINE HCL 50 MG PO TABS: ORAL_TABLET | ORAL | 0 refills | Status: DC

## 2022-06-20 ENCOUNTER — Telehealth: Payer: Self-pay | Admitting: *Deleted

## 2022-06-20 NOTE — Telephone Encounter (Signed)
Notified to come tomorrow at 3:00 for labs and then see Dr Alen Blew

## 2022-06-20 NOTE — Telephone Encounter (Signed)
Barry Gibson states the Cipro was recently renewed for the UTI, states Barry Gibson has had no improvement, nausea at times, fatigue and little appetite. Wants to know if Barry Gibson can be seen and have labs before the weekend. (Phone appt with Dr Alen Blew Friday at 3:45)

## 2022-06-21 ENCOUNTER — Inpatient Hospital Stay: Payer: BC Managed Care – PPO

## 2022-06-21 ENCOUNTER — Inpatient Hospital Stay: Payer: BC Managed Care – PPO | Admitting: Oncology

## 2022-06-21 ENCOUNTER — Other Ambulatory Visit: Payer: Self-pay

## 2022-06-21 ENCOUNTER — Telehealth: Payer: Self-pay | Admitting: *Deleted

## 2022-06-21 ENCOUNTER — Inpatient Hospital Stay: Payer: BC Managed Care – PPO | Attending: Oncology | Admitting: Oncology

## 2022-06-21 ENCOUNTER — Other Ambulatory Visit: Payer: Self-pay | Admitting: *Deleted

## 2022-06-21 VITALS — BP 99/71 | HR 62 | Resp 16

## 2022-06-21 DIAGNOSIS — Z95828 Presence of other vascular implants and grafts: Secondary | ICD-10-CM

## 2022-06-21 DIAGNOSIS — C670 Malignant neoplasm of trigone of bladder: Secondary | ICD-10-CM | POA: Insufficient documentation

## 2022-06-21 DIAGNOSIS — Z87440 Personal history of urinary (tract) infections: Secondary | ICD-10-CM | POA: Diagnosis not present

## 2022-06-21 DIAGNOSIS — C772 Secondary and unspecified malignant neoplasm of intra-abdominal lymph nodes: Secondary | ICD-10-CM | POA: Diagnosis not present

## 2022-06-21 DIAGNOSIS — N390 Urinary tract infection, site not specified: Secondary | ICD-10-CM

## 2022-06-21 DIAGNOSIS — Z883 Allergy status to other anti-infective agents status: Secondary | ICD-10-CM | POA: Insufficient documentation

## 2022-06-21 DIAGNOSIS — Z5112 Encounter for antineoplastic immunotherapy: Secondary | ICD-10-CM | POA: Insufficient documentation

## 2022-06-21 DIAGNOSIS — R627 Adult failure to thrive: Secondary | ICD-10-CM | POA: Insufficient documentation

## 2022-06-21 DIAGNOSIS — Z79899 Other long term (current) drug therapy: Secondary | ICD-10-CM | POA: Insufficient documentation

## 2022-06-21 LAB — CMP (CANCER CENTER ONLY)
ALT: 10 U/L (ref 0–44)
AST: 14 U/L — ABNORMAL LOW (ref 15–41)
Albumin: 4 g/dL (ref 3.5–5.0)
Alkaline Phosphatase: 57 U/L (ref 38–126)
Anion gap: 6 (ref 5–15)
BUN: 32 mg/dL — ABNORMAL HIGH (ref 6–20)
CO2: 27 mmol/L (ref 22–32)
Calcium: 9.4 mg/dL (ref 8.9–10.3)
Chloride: 104 mmol/L (ref 98–111)
Creatinine: 1.87 mg/dL — ABNORMAL HIGH (ref 0.61–1.24)
GFR, Estimated: 41 mL/min — ABNORMAL LOW (ref >60)
Glucose, Bld: 82 mg/dL (ref 70–99)
Potassium: 3.8 mmol/L (ref 3.5–5.1)
Sodium: 137 mmol/L (ref 135–145)
Total Bilirubin: 0.5 mg/dL (ref 0.3–1.2)
Total Protein: 7.2 g/dL (ref 6.5–8.1)

## 2022-06-21 LAB — CBC WITH DIFFERENTIAL (CANCER CENTER ONLY)
Abs Immature Granulocytes: 0.03 10*3/uL (ref 0.00–0.07)
Basophils Absolute: 0.1 10*3/uL (ref 0.0–0.1)
Basophils Relative: 2 %
Eosinophils Absolute: 0.5 10*3/uL (ref 0.0–0.5)
Eosinophils Relative: 7 %
HCT: 45.9 % (ref 39.0–52.0)
Hemoglobin: 15.1 g/dL (ref 13.0–17.0)
Immature Granulocytes: 0 %
Lymphocytes Relative: 28 %
Lymphs Abs: 2 10*3/uL (ref 0.7–4.0)
MCH: 28.1 pg (ref 26.0–34.0)
MCHC: 32.9 g/dL (ref 30.0–36.0)
MCV: 85.5 fL (ref 80.0–100.0)
Monocytes Absolute: 0.8 10*3/uL (ref 0.1–1.0)
Monocytes Relative: 12 %
Neutro Abs: 3.7 10*3/uL (ref 1.7–7.7)
Neutrophils Relative %: 51 %
Platelet Count: 225 10*3/uL (ref 150–400)
RBC: 5.37 MIL/uL (ref 4.22–5.81)
RDW: 14.7 % (ref 11.5–15.5)
WBC Count: 7.2 10*3/uL (ref 4.0–10.5)
nRBC: 0 % (ref 0.0–0.2)

## 2022-06-21 LAB — TSH: TSH: 2.767 u[IU]/mL (ref 0.350–4.500)

## 2022-06-21 MED ORDER — SODIUM CHLORIDE 0.9% FLUSH
10.0000 mL | Freq: Once | INTRAVENOUS | Status: AC
  Administered 2022-06-21: 10 mL

## 2022-06-21 MED ORDER — SODIUM CHLORIDE 0.9 % IV SOLN: Freq: Once | INTRAVENOUS | Status: AC

## 2022-06-21 MED ORDER — AMLODIPINE BESYLATE 10 MG PO TABS: ORAL_TABLET | ORAL | 1 refills | Status: DC

## 2022-06-21 MED ORDER — HEPARIN SOD (PORK) LOCK FLUSH 100 UNIT/ML IV SOLN
500.0000 [IU] | Freq: Once | INTRAVENOUS | Status: AC
  Administered 2022-06-21: 500 [IU]

## 2022-06-21 MED ORDER — CIPROFLOXACIN HCL 500 MG PO TABS: 500.0000 mg | ORAL_TABLET | Freq: Two times a day (BID) | ORAL | 1 refills | Status: DC

## 2022-06-21 NOTE — Progress Notes (Signed)
Hematology and Oncology Follow Up Visit  Barry Gibson [id number] Jan 04, 1961 61 y.o. 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  He was found to have with pelvic and abdominal adenopathy after presenting with localized disease in January 2022.   Prior Therapy:   He underwent robotic assisted laparoscopic radical cystectomy and lymphadenectomy January 2022.  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  He had 4 out of 7 lymph nodes involved with the final pathological staging is T4N2 disease.     He underwent staging scans on April 19, 2021 which showed interval progression of abdominal pelvic adenopathy consistent with worsening nodal metastasis.    Chemotherapy utilizing gemcitabine and cisplatin started on June 08, 2021 receiving day 1 cisplatin and gemcitabine with day 8 gemcitabine alone.  He completed cycle 4 on August 17, 2021.     Current therapy: Pembrolizumab 200 mg every 3 weeks started on September 07, 2021.  He is currently receiving 400 mg every 6 weeks.  Last cycle was given on June 07, 2022.   Interim History: Barry Gibson is here for a follow-up.  Since the last visit, he was diagnosed with a urinary tract infection and was treated with ciprofloxacin and currently completing a second course after symptoms have relapsed.  He was to have restarted on Cipro again in the last few days although he still feeling poorly.  He denies any fevers chills but does report headaches and occasional unsteadiness.  His intake has been relatively poor.     Medications: Updated on review. Current Outpatient Medications  Medication Sig Dispense Refill   acetaminophen (TYLENOL) 650 MG CR tablet Take 1,300 mg by mouth every morning.      amLODipine (NORVASC) 10 MG tablet TAKE 1 TABLET(10 MG) BY MOUTH DAILY 90 tablet 1   ciprofloxacin (CIPRO) 500 MG tablet Take 1 tablet (500 mg total) by mouth 2 (two) times daily. 14 tablet 0   lidocaine-prilocaine (EMLA) cream Apply 1 application topically as needed. 30 g 0   Multiple Vitamin (MULTIVITAMIN WITH MINERALS) TABS tablet Take 1 tablet by mouth daily.     omeprazole (PRILOSEC) 40 MG capsule Take 40 mg by mouth daily.     prednisoLONE acetate (PRED FORTE) 1 % ophthalmic suspension Place 1 drop into the left eye 2 (two) times a week. Instill 1 drop into left eye Monday and Friday.     prochlorperazine (COMPAZINE) 10 MG tablet TAKE 1 TABLET(10 MG) BY MOUTH EVERY 6 HOURS AS NEEDED FOR NAUSEA OR VOMITING 90 tablet 2   rosuvastatin (CRESTOR) 20 MG tablet TAKE 1 TABLET(20 MG) BY MOUTH DAILY 90 tablet 1   sertraline (ZOLOFT) 50 MG tablet TAKE 1 TABLET(50 MG) BY MOUTH DAILY 30 tablet 0   sildenafil (VIAGRA) 100 MG tablet SMARTSIG:1 Tablet(s) By Mouth     No current facility-administered medications for this visit.   Facility-Administered Medications Ordered in Other Visits  Medication Dose Route Frequency Provider Last Rate Last Admin   acetaminophen (TYLENOL) 325 MG tablet            diphenhydrAMINE (BENADRYL) 25 mg capsule            gemcitabine (GEMZAR) chemo syringe for bladder instillation 2,000 mg  2,000 mg Bladder Instillation Once Franchot Gallo, MD  gemcitabine (GEMZAR) chemo syringe for bladder instillation 2,000 mg  2,000 mg Bladder Instillation Once Franchot Gallo, MD         Allergies:  Allergies  Allergen Reactions   Ace Inhibitors Cough   Diflucan [Fluconazole] Other (See Comments)    Irritated ulcers      Physical Exam:       Blood pressure 102/70, pulse 81, temperature 97.8 F (36.6 C), temperature source Oral, resp. rate 15, weight 212 lb 1.6 oz (96.2 kg), SpO2 97 %.     ECOG: 0    General appearance: Comfortable appearing without  any discomfort Head: Normocephalic without any trauma Oropharynx: Mucous membranes are moist and pink without any thrush or ulcers. Eyes: Pupils are equal and round reactive to light. Lymph nodes: No cervical, supraclavicular, inguinal or axillary lymphadenopathy.   Heart:regular rate and rhythm.  S1 and S2 without leg edema. Lung: Clear without any rhonchi or wheezes.  No dullness to percussion. Abdomin: Soft, nontender, nondistended with good bowel sounds.  No hepatosplenomegaly. Musculoskeletal: No joint deformity or effusion.  Full range of motion noted. Neurological: No deficits noted on motor, sensory and deep tendon reflex exam. Skin: No petechial rash or dryness.  Appeared moist.              Lab Results: Lab Results  Component Value Date   WBC 6.8 06/07/2022   HGB 14.0 06/07/2022   HCT 42.5 06/07/2022   MCV 85.2 06/07/2022   PLT 273 06/07/2022   PSA 0.53 11/25/2014     Chemistry      Component Value Date/Time   NA 137 06/07/2022 0915   NA 141 11/02/2021 1512   K 3.8 06/07/2022 0915   CL 106 06/07/2022 0915   CO2 25 06/07/2022 0915   BUN 23 (H) 06/07/2022 0915   BUN 23 11/02/2021 1512   CREATININE 1.72 (H) 06/07/2022 0915   CREATININE 1.05 11/25/2014 0826      Component Value Date/Time   CALCIUM 9.6 06/07/2022 0915   ALKPHOS 61 06/07/2022 0915   AST 14 (L) 06/07/2022 0915   ALT 9 06/07/2022 0915   BILITOT 0.5 06/07/2022 0915      (ICD10-I70.0).   Abdomen / Pelvis Impression:   1. Interval enlargement of peritoneal nodular implant in the LEFT lower quadrant/iliac fossa. 2. Stable enlarged RIGHT external iliac lymph node. 3. Post cystectomy anatomy and ileal conduit formation without complication. 4. 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 No follow-up recommended for this lesion. 5. 3.9 cm infrarenal abdominal aortic aneurysm. Recommend follow-up every 2 years. Reference: J Am Coll Radiol 4854;62:703-500.    Impression and  Plan:  61 year old man with:  1.  Stage IV high-grade urothelial carcinoma of the bladder with pelvic adenopathy diagnosed in 2022.  The natural course of this disease was reviewed at this time and treatment choices were discussed.  He is currently on Pembrolizumab without any major complications.  CT scan obtained on June 17, 2022 was personally reviewed and showed overall stable disease with very minimal progression.  Risks and benefits of continuing Pembrolizumab versus switching to different salvage therapy options were discussed.  At this time, I recommended continuing the same dose and schedule.    2.  Urinary tract infection: Urine culture obtained on July 17 showed evidence of Pseudomonas infection and completed 1 course of ciprofloxacin currently on another course.  I have recommended completing 2 more weeks of ciprofloxacin.  If he develop worsening symptoms of fevers or chills to report  to emergency department immediately for possible need for intravenous antibiotics.  3.  Failure to thrive and  poor p.o. intake: We will hydrate with 500 cc of normal saline.       4.  Follow-up: Will be in the next 4 to 5 weeks for the next Keytruda infusion.   30  minutes were dedicated to this visit.  The time was spent on reviewing laboratory data, reviewing imaging studies, disease status update and outlining future plan of care discussion.   Zola Button, MD 8/4/20231:41 PM

## 2022-06-21 NOTE — Telephone Encounter (Signed)
Barry Gibson states Barry Gibson is feeling worse. Instructed her to bring him in to the [facility] early. Will be here ~ 1:30

## 2022-06-21 NOTE — Patient Instructions (Signed)

## 2022-06-22 ENCOUNTER — Other Ambulatory Visit: Payer: Self-pay

## 2022-06-24 ENCOUNTER — Other Ambulatory Visit: Payer: Self-pay

## 2022-06-24 ENCOUNTER — Telehealth: Payer: Self-pay | Admitting: Oncology

## 2022-06-24 NOTE — Telephone Encounter (Signed)
Scheduled per 08/04 los, patient has been called and notified.

## 2022-06-28 ENCOUNTER — Other Ambulatory Visit: Payer: Self-pay

## 2022-06-28 DIAGNOSIS — F419 Anxiety disorder, unspecified: Secondary | ICD-10-CM

## 2022-06-28 DIAGNOSIS — F32A Depression, unspecified: Secondary | ICD-10-CM

## 2022-06-28 MED ORDER — SERTRALINE HCL 50 MG PO TABS: ORAL_TABLET | ORAL | 0 refills | Status: DC

## 2022-06-29 ENCOUNTER — Other Ambulatory Visit: Payer: Self-pay | Admitting: Oncology

## 2022-06-29 ENCOUNTER — Other Ambulatory Visit: Payer: Self-pay

## 2022-06-29 DIAGNOSIS — C679 Malignant neoplasm of bladder, unspecified: Secondary | ICD-10-CM

## 2022-07-19 ENCOUNTER — Inpatient Hospital Stay (HOSPITAL_BASED_OUTPATIENT_CLINIC_OR_DEPARTMENT_OTHER): Payer: BC Managed Care – PPO | Admitting: Oncology

## 2022-07-19 ENCOUNTER — Inpatient Hospital Stay: Payer: BC Managed Care – PPO

## 2022-07-19 ENCOUNTER — Other Ambulatory Visit: Payer: Self-pay

## 2022-07-19 ENCOUNTER — Inpatient Hospital Stay: Payer: BC Managed Care – PPO | Attending: Oncology

## 2022-07-19 VITALS — BP 121/79 | HR 85 | Temp 98.4°F | Resp 18 | Ht 71.0 in | Wt 211.4 lb

## 2022-07-19 DIAGNOSIS — Z87440 Personal history of urinary (tract) infections: Secondary | ICD-10-CM | POA: Diagnosis not present

## 2022-07-19 DIAGNOSIS — R627 Adult failure to thrive: Secondary | ICD-10-CM | POA: Insufficient documentation

## 2022-07-19 DIAGNOSIS — Z883 Allergy status to other anti-infective agents status: Secondary | ICD-10-CM | POA: Diagnosis not present

## 2022-07-19 DIAGNOSIS — Z79899 Other long term (current) drug therapy: Secondary | ICD-10-CM | POA: Insufficient documentation

## 2022-07-19 DIAGNOSIS — C679 Malignant neoplasm of bladder, unspecified: Secondary | ICD-10-CM

## 2022-07-19 DIAGNOSIS — C670 Malignant neoplasm of trigone of bladder: Secondary | ICD-10-CM | POA: Diagnosis present

## 2022-07-19 DIAGNOSIS — Z5112 Encounter for antineoplastic immunotherapy: Secondary | ICD-10-CM | POA: Insufficient documentation

## 2022-07-19 DIAGNOSIS — R590 Localized enlarged lymph nodes: Secondary | ICD-10-CM | POA: Insufficient documentation

## 2022-07-19 DIAGNOSIS — Z95828 Presence of other vascular implants and grafts: Secondary | ICD-10-CM

## 2022-07-19 LAB — CMP (CANCER CENTER ONLY)
ALT: 11 U/L (ref 0–44)
AST: 14 U/L — ABNORMAL LOW (ref 15–41)
Albumin: 3.8 g/dL (ref 3.5–5.0)
Alkaline Phosphatase: 71 U/L (ref 38–126)
Anion gap: 4 — ABNORMAL LOW (ref 5–15)
BUN: 23 mg/dL — ABNORMAL HIGH (ref 6–20)
CO2: 26 mmol/L (ref 22–32)
Calcium: 9.2 mg/dL (ref 8.9–10.3)
Chloride: 108 mmol/L (ref 98–111)
Creatinine: 1.6 mg/dL — ABNORMAL HIGH (ref 0.61–1.24)
GFR, Estimated: 49 mL/min — ABNORMAL LOW (ref >60)
Glucose, Bld: 105 mg/dL — ABNORMAL HIGH (ref 70–99)
Potassium: 3.7 mmol/L (ref 3.5–5.1)
Sodium: 138 mmol/L (ref 135–145)
Total Bilirubin: 0.5 mg/dL (ref 0.3–1.2)
Total Protein: 6.8 g/dL (ref 6.5–8.1)

## 2022-07-19 LAB — CBC WITH DIFFERENTIAL (CANCER CENTER ONLY)
Abs Immature Granulocytes: 0.01 10*3/uL (ref 0.00–0.07)
Basophils Absolute: 0.1 10*3/uL (ref 0.0–0.1)
Basophils Relative: 1 %
Eosinophils Absolute: 0.3 10*3/uL (ref 0.0–0.5)
Eosinophils Relative: 5 %
HCT: 42.8 % (ref 39.0–52.0)
Hemoglobin: 14.2 g/dL (ref 13.0–17.0)
Immature Granulocytes: 0 %
Lymphocytes Relative: 25 %
Lymphs Abs: 1.6 10*3/uL (ref 0.7–4.0)
MCH: 28.5 pg (ref 26.0–34.0)
MCHC: 33.2 g/dL (ref 30.0–36.0)
MCV: 85.9 fL (ref 80.0–100.0)
Monocytes Absolute: 0.7 10*3/uL (ref 0.1–1.0)
Monocytes Relative: 10 %
Neutro Abs: 3.6 10*3/uL (ref 1.7–7.7)
Neutrophils Relative %: 59 %
Platelet Count: 171 10*3/uL (ref 150–400)
RBC: 4.98 MIL/uL (ref 4.22–5.81)
RDW: 14.5 % (ref 11.5–15.5)
WBC Count: 6.3 10*3/uL (ref 4.0–10.5)
nRBC: 0 % (ref 0.0–0.2)

## 2022-07-19 LAB — TSH: TSH: 1.508 u[IU]/mL (ref 0.350–4.500)

## 2022-07-19 MED ORDER — HEPARIN SOD (PORK) LOCK FLUSH 100 UNIT/ML IV SOLN
500.0000 [IU] | Freq: Once | INTRAVENOUS | Status: AC | PRN
  Administered 2022-07-19: 500 [IU]

## 2022-07-19 MED ORDER — SODIUM CHLORIDE 0.9 % IV SOLN
400.0000 mg | Freq: Once | INTRAVENOUS | Status: AC
  Administered 2022-07-19: 400 mg via INTRAVENOUS
  Filled 2022-07-19: qty 16

## 2022-07-19 MED ORDER — SODIUM CHLORIDE 0.9% FLUSH
10.0000 mL | INTRAVENOUS | Status: DC | PRN
  Administered 2022-07-19: 10 mL

## 2022-07-19 MED ORDER — SODIUM CHLORIDE 0.9% FLUSH
10.0000 mL | Freq: Once | INTRAVENOUS | Status: AC
  Administered 2022-07-19: 10 mL

## 2022-07-19 MED ORDER — SODIUM CHLORIDE 0.9 % IV SOLN: Freq: Once | INTRAVENOUS | Status: AC

## 2022-07-19 NOTE — Progress Notes (Signed)
Per Dr. Alen Blew, okay to proceed with elevated Scr of 1.60 today.

## 2022-07-19 NOTE — Patient Instructions (Signed)
[facility]  Discharge Instructions: Thank you for choosing [facility] to provide your oncology and hematology care.   If you have a lab appointment with the Cancer Center, please go directly to the Cancer Center and check in at the registration area.   Wear comfortable clothing and clothing appropriate for easy access to any Portacath or PICC line.   We strive to give you quality time with your provider. You may need to reschedule your appointment if you arrive late (15 or more minutes).  Arriving late affects you and other patients whose appointments are after yours.  Also, if you miss three or more appointments without notifying the office, you may be dismissed from the clinic at the provider's discretion.      For prescription refill requests, have your pharmacy contact our office and allow 72 hours for refills to be completed.    Today you received the following chemotherapy and/or immunotherapy agents keytruda      To help prevent nausea and vomiting after your treatment, we encourage you to take your nausea medication as directed.  BELOW ARE SYMPTOMS THAT SHOULD BE REPORTED IMMEDIATELY: *FEVER GREATER THAN 100.4 F (38 C) OR HIGHER *CHILLS OR SWEATING *NAUSEA AND VOMITING THAT IS NOT CONTROLLED WITH YOUR NAUSEA MEDICATION *UNUSUAL SHORTNESS OF BREATH *UNUSUAL BRUISING OR BLEEDING *URINARY PROBLEMS (pain or burning when urinating, or frequent urination) *BOWEL PROBLEMS (unusual diarrhea, constipation, pain near the anus) TENDERNESS IN MOUTH AND THROAT WITH OR WITHOUT PRESENCE OF ULCERS (sore throat, sores in mouth, or a toothache) UNUSUAL RASH, SWELLING OR PAIN  UNUSUAL VAGINAL DISCHARGE OR ITCHING   Items with * indicate a potential emergency and should be followed up as soon as possible or go to the Emergency Department if any problems should occur.  Please show the CHEMOTHERAPY ALERT CARD or IMMUNOTHERAPY ALERT CARD at check-in to  the Emergency Department and triage nurse.  Should you have questions after your visit or need to cancel or reschedule your appointment, please contact [facility]  Dept: [phone]  and follow the prompts.  Office hours are 8:00 a.m. to 4:30 p.m. Monday - Friday. Please note that voicemails left after 4:00 p.m. may not be returned until the following business day.  We are closed weekends and major holidays. You have access to a nurse at all times for urgent questions. Please call the main number to the clinic Dept: [phone] and follow the prompts.   For any non-urgent questions, you may also contact your provider using MyChart. We now offer e-Visits for anyone 18 and older to request care online for non-urgent symptoms. For details visit [email or web address].   Also download the MyChart app! Go to the app store, search "MyChart", open the app, select [facility], and log in with your MyChart username and password.  Masks are optional in the cancer centers. If you would like for your care team to wear a mask while they are taking care of you, please let them know. You may have one support person who is at least 60 years old accompany you for your appointments. 

## 2022-07-19 NOTE — Progress Notes (Signed)
Hematology and Oncology Follow Up Visit  Barry Gibson [id number] [birth year] 61 y.o. 07/19/2022 2:22 PM [name], [place], [place]   Principle Diagnosis: 61 year old man with bladder cancer diagnosed in January 2022.  He subsequently developed 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   Prior Therapy:   He underwent robotic assisted laparoscopic radical cystectomy and lymphadenectomy January 2022.  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  He had 4 out of 7 lymph nodes involved with the final pathological staging is T4N2 disease.     He underwent staging scans on April 19, 2021 which showed interval progression of abdominal pelvic adenopathy consistent with worsening nodal metastasis.    Chemotherapy utilizing gemcitabine and cisplatin started on June 08, 2021 receiving day 1 cisplatin and gemcitabine with day 8 gemcitabine alone.  He completed cycle 4 on August 17, 2021.     Current therapy: Pembrolizumab 200 mg every 3 weeks started on September 07, 2021.  He is currently receiving 400 mg every 6 weeks.   He is here for the next cycle of therapy.   Interim History: Barry Gibson returns today for repeat follow-up.  Since the last visit, he reports no major changes in his health.  He continues to tolerate Pembrolizumab at the current dose and schedule.  He denies any skin rashes or lesions.  He denies any hospitalizations or illnesses.  He denies any urinary symptoms.  His urinary tract infection has resolved is able to eat better currently.     Medications: Reviewed without changes. Current Outpatient Medications  Medication Sig Dispense Refill   acetaminophen (TYLENOL) 650 MG CR tablet Take 1,300 mg by mouth every morning.     amLODipine (NORVASC) 10 MG tablet TAKE 1 TABLET(10 MG) BY MOUTH DAILY 90 tablet 1    ciprofloxacin (CIPRO) 500 MG tablet Take 1 tablet (500 mg total) by mouth 2 (two) times daily. 14 tablet 1   lidocaine-prilocaine (EMLA) cream Apply 1 application topically as needed. 30 g 0   Multiple Vitamin (MULTIVITAMIN WITH MINERALS) TABS tablet Take 1 tablet by mouth daily.     omeprazole (PRILOSEC) 40 MG capsule Take 40 mg by mouth daily.     prednisoLONE acetate (PRED FORTE) 1 % ophthalmic suspension Place 1 drop into the left eye 2 (two) times a week. Instill 1 drop into left eye Monday and Friday.     prochlorperazine (COMPAZINE) 10 MG tablet TAKE 1 TABLET(10 MG) BY MOUTH EVERY 6 HOURS AS NEEDED FOR NAUSEA OR VOMITING 90 tablet 2   rosuvastatin (CRESTOR) 20 MG tablet TAKE 1 TABLET(20 MG) BY MOUTH DAILY 90 tablet 1   sertraline (ZOLOFT) 50 MG tablet TAKE 1 TABLET(50 MG) BY MOUTH DAILY 30 tablet 0   sildenafil (VIAGRA) 100 MG tablet SMARTSIG:1 Tablet(s) By Mouth     No current facility-administered medications for this visit.   Facility-Administered Medications Ordered in Other Visits  Medication Dose Route Frequency Provider Last Rate Last Admin   acetaminophen (TYLENOL) 325 MG tablet            diphenhydrAMINE (BENADRYL) 25 mg capsule            gemcitabine (GEMZAR) chemo syringe for bladder instillation 2,000 mg  2,000 mg Bladder Instillation Once Franchot Gallo, MD       gemcitabine [facility]) chemo syringe for bladder instillation 2,000 mg  2,000 mg Bladder  Instillation Once Franchot Gallo, MD         Allergies:  Allergies  Allergen Reactions   Ace Inhibitors Cough   Diflucan [Fluconazole] Other (See Comments)    Irritated ulcers      Physical Exam:    Blood pressure 121/79, pulse 85, temperature 98.4 F (36.9 C), temperature source Tympanic, resp. rate 18, height '5\' 11"'$  (1.803 m), weight 211 lb 6.4 oz (95.9 kg), SpO2 98 %.         ECOG: 0   General appearance: Alert, awake without any distress. Head: Atraumatic without abnormalities Oropharynx:  Without any thrush or ulcers. Eyes: No scleral icterus. Lymph nodes: No lymphadenopathy noted in the cervical, supraclavicular, or axillary nodes Heart:regular rate and rhythm, without any murmurs or gallops.   Lung: Clear to auscultation without any rhonchi, wheezes or dullness to percussion. Abdomin: Soft, nontender without any shifting dullness or ascites. Musculoskeletal: No clubbing or cyanosis. Neurological: No motor or sensory deficits. Skin: No rashes or lesions.             Lab Results: Lab Results  Component Value Date   WBC 7.2 06/21/2022   HGB 15.1 06/21/2022   HCT 45.9 06/21/2022   MCV 85.5 06/21/2022   PLT 225 06/21/2022   PSA 0.53 11/25/2014     Chemistry      Component Value Date/Time   NA 137 06/21/2022 1336   NA 141 11/02/2021 1512   K 3.8 06/21/2022 1336   CL 104 06/21/2022 1336   CO2 27 06/21/2022 1336   BUN 32 (H) 06/21/2022 1336   BUN 23 11/02/2021 1512   CREATININE 1.87 (H) 06/21/2022 1336   CREATININE 1.05 11/25/2014 0826      Component Value Date/Time   CALCIUM 9.4 06/21/2022 1336   ALKPHOS 57 06/21/2022 1336   AST 14 (L) 06/21/2022 1336   ALT 10 06/21/2022 1336   BILITOT 0.5 06/21/2022 1336          Impression and Plan:  61 year old man with:  1.  Kidney cancer diagnosed in 2022.  He developed stage IV high-grade urothelial carcinoma with pelvic adenopathy.   He continues to tolerate Pembrolizumab without any major complaints.  Risks and benefits of continuing this treatment every 6 weeks were discussed.  These include autoimmune complications, GI toxicity and dermatological issues.  He is agreeable to proceed and we will update his staging scans in November 2023.    2.  Urinary tract infection: Resolved at this time and he has completed antibiotic course.  3.  Failure to thrive and  poor p.o. intake: Resolved at this time able to hydrate and eat better.  Weight is stable and has resumed activities of daily  living.       4.  Follow-up: In 6 weeks for the next cycle of therapy.   30  minutes were spent on this encounter.  The time was dedicated to reviewing laboratory data, disease status update, treatment choices and future plan of care discussion.   Zola Button, MD 9/1/20232:22 PM

## 2022-07-20 LAB — T4: T4, Total: 7.9 ug/dL (ref 4.5–12.0)

## 2022-07-24 ENCOUNTER — Telehealth: Payer: Self-pay | Admitting: Oncology

## 2022-07-24 NOTE — Telephone Encounter (Signed)
Called patient regarding upcoming October appointment, patient is notified.   

## 2022-07-25 ENCOUNTER — Other Ambulatory Visit: Payer: Self-pay

## 2022-07-29 ENCOUNTER — Telehealth: Payer: Self-pay | Admitting: *Deleted

## 2022-07-29 NOTE — Telephone Encounter (Signed)
Barry Gibson states Barry Gibson has been experiencing morning nausea- throws up each morning, has very little appetite, falls asleep whenever he sits down. Is "tired of feeling bad". He starts to feel better the week before his next treatment. Is wondering if he can take a break or decrease the dose. Please send refill of compazine to [facility] in [place] if Dr Alen Blew feels like that is the best for his nausea.

## 2022-07-30 ENCOUNTER — Other Ambulatory Visit: Payer: Self-pay | Admitting: *Deleted

## 2022-07-30 ENCOUNTER — Encounter: Payer: Self-pay | Admitting: Oncology

## 2022-07-30 MED ORDER — PROCHLORPERAZINE MALEATE 10 MG PO TABS
ORAL_TABLET | ORAL | 2 refills | Status: DC
Start: 2022-07-30 — End: 2022-09-02

## 2022-07-30 NOTE — Telephone Encounter (Signed)
Refill sent for compazine. Notified that Dr Alen Blew will discuss treatment at next visit

## 2022-08-05 ENCOUNTER — Telehealth: Payer: Self-pay | Admitting: *Deleted

## 2022-08-05 NOTE — Telephone Encounter (Signed)
LM with Dr Hazeline Junker message below

## 2022-08-05 NOTE — Telephone Encounter (Signed)
Barry Gibson states the compazine has helped Barry Gibson's nausea, but he still has very little appetite and still no energy. Has an appt with Dr Tresa Moore tomorrow.

## 2022-08-06 ENCOUNTER — Other Ambulatory Visit: Payer: Self-pay

## 2022-08-08 ENCOUNTER — Telehealth: Payer: Self-pay | Admitting: *Deleted

## 2022-08-08 NOTE — Telephone Encounter (Signed)
Labwork from 07/19/22 faxed to [facility] per patient request, fax confirmation received.

## 2022-08-09 ENCOUNTER — Other Ambulatory Visit: Payer: Self-pay

## 2022-08-19 ENCOUNTER — Telehealth: Payer: Self-pay | Admitting: Family Medicine

## 2022-08-19 ENCOUNTER — Encounter: Payer: Self-pay | Admitting: Family Medicine

## 2022-08-19 DIAGNOSIS — N1831 Chronic kidney disease, stage 3a: Secondary | ICD-10-CM

## 2022-08-19 DIAGNOSIS — E782 Mixed hyperlipidemia: Secondary | ICD-10-CM

## 2022-08-19 DIAGNOSIS — N183 Chronic kidney disease, stage 3 unspecified: Secondary | ICD-10-CM | POA: Insufficient documentation

## 2022-08-19 NOTE — Telephone Encounter (Signed)
No recent labs ordered from our office. Please advise. Thank you.

## 2022-08-19 NOTE — Telephone Encounter (Signed)
Patient wanting to know if he needs labs done before appointment on 10/16 for medication follow up.

## 2022-08-20 NOTE — Telephone Encounter (Signed)
Spoke with patient and understood labs have been ordered

## 2022-08-20 NOTE — Telephone Encounter (Signed)
Lab orders placed. Left message to return call  

## 2022-08-21 ENCOUNTER — Telehealth: Payer: Self-pay | Admitting: *Deleted

## 2022-08-21 ENCOUNTER — Telehealth: Payer: Self-pay | Admitting: Oncology

## 2022-08-21 NOTE — Telephone Encounter (Signed)
Spoke with spouse confirming 10/5 appointment.

## 2022-08-21 NOTE — Telephone Encounter (Signed)
Dr. Alen Blew response:  "Lab + MD (3:30) 10/5. Please schedule. Thanks"  [facility] Schedule message sent with above message from MD.  Contacted Ms. Franzen with MD response and advised that CC scheduling will contact her to schedule appts for 10/5. She verbalized understanding of all information

## 2022-08-21 NOTE — Telephone Encounter (Signed)
Patient's spouse called to report that patient continues not feeling well - very fatigued, lifeless, poor appetite, generalized aches/pains.  Compazine prescribed by Dr Alen Blew has decreased nausea.    Patient had appt with Dr. Tresa Moore on 9/19 with Urine Culture completed at that visit. Wife states results were negative for UTI. She also reports patient recently completed 7 day course of prednisone prescribed for arthritis flare and wife states he felt better during that time.  She wants to know if patient could be seen this week here at [facility] to be checked and maybe have labs? He currently has appt on 10/13 with Dr. Alen Blew.   Call information/question routed to Dr. Alen Blew

## 2022-08-22 ENCOUNTER — Inpatient Hospital Stay (HOSPITAL_BASED_OUTPATIENT_CLINIC_OR_DEPARTMENT_OTHER): Payer: BC Managed Care – PPO | Admitting: Oncology

## 2022-08-22 ENCOUNTER — Other Ambulatory Visit: Payer: Self-pay

## 2022-08-22 ENCOUNTER — Inpatient Hospital Stay: Payer: BC Managed Care – PPO | Attending: Oncology

## 2022-08-22 VITALS — BP 102/75 | HR 72 | Temp 98.1°F | Resp 16 | Ht 71.0 in | Wt 208.0 lb

## 2022-08-22 DIAGNOSIS — Z79899 Other long term (current) drug therapy: Secondary | ICD-10-CM | POA: Diagnosis not present

## 2022-08-22 DIAGNOSIS — M2550 Pain in unspecified joint: Secondary | ICD-10-CM | POA: Diagnosis not present

## 2022-08-22 DIAGNOSIS — C670 Malignant neoplasm of trigone of bladder: Secondary | ICD-10-CM | POA: Diagnosis present

## 2022-08-22 DIAGNOSIS — R5383 Other fatigue: Secondary | ICD-10-CM | POA: Insufficient documentation

## 2022-08-22 DIAGNOSIS — R590 Localized enlarged lymph nodes: Secondary | ICD-10-CM | POA: Diagnosis not present

## 2022-08-22 DIAGNOSIS — R509 Fever, unspecified: Secondary | ICD-10-CM | POA: Insufficient documentation

## 2022-08-22 DIAGNOSIS — M791 Myalgia, unspecified site: Secondary | ICD-10-CM | POA: Insufficient documentation

## 2022-08-22 DIAGNOSIS — Z883 Allergy status to other anti-infective agents status: Secondary | ICD-10-CM | POA: Diagnosis not present

## 2022-08-22 DIAGNOSIS — R110 Nausea: Secondary | ICD-10-CM | POA: Insufficient documentation

## 2022-08-22 DIAGNOSIS — C679 Malignant neoplasm of bladder, unspecified: Secondary | ICD-10-CM | POA: Diagnosis not present

## 2022-08-22 DIAGNOSIS — N390 Urinary tract infection, site not specified: Secondary | ICD-10-CM | POA: Diagnosis not present

## 2022-08-22 LAB — CMP (CANCER CENTER ONLY)
ALT: 9 U/L (ref 0–44)
AST: 13 U/L — ABNORMAL LOW (ref 15–41)
Albumin: 3.9 g/dL (ref 3.5–5.0)
Alkaline Phosphatase: 106 U/L (ref 38–126)
Anion gap: 4 — ABNORMAL LOW (ref 5–15)
BUN: 19 mg/dL (ref 6–20)
CO2: 30 mmol/L (ref 22–32)
Calcium: 9 mg/dL (ref 8.9–10.3)
Chloride: 105 mmol/L (ref 98–111)
Creatinine: 1.68 mg/dL — ABNORMAL HIGH (ref 0.61–1.24)
GFR, Estimated: 47 mL/min — ABNORMAL LOW (ref >60)
Glucose, Bld: 91 mg/dL (ref 70–99)
Potassium: 4.4 mmol/L (ref 3.5–5.1)
Sodium: 139 mmol/L (ref 135–145)
Total Bilirubin: 0.4 mg/dL (ref 0.3–1.2)
Total Protein: 6.9 g/dL (ref 6.5–8.1)

## 2022-08-22 LAB — CBC WITH DIFFERENTIAL (CANCER CENTER ONLY)
Abs Immature Granulocytes: 0.04 10*3/uL (ref 0.00–0.07)
Basophils Absolute: 0.1 10*3/uL (ref 0.0–0.1)
Basophils Relative: 1 %
Eosinophils Absolute: 0.6 10*3/uL — ABNORMAL HIGH (ref 0.0–0.5)
Eosinophils Relative: 7 %
HCT: 43.9 % (ref 39.0–52.0)
Hemoglobin: 14.2 g/dL (ref 13.0–17.0)
Immature Granulocytes: 1 %
Lymphocytes Relative: 26 %
Lymphs Abs: 2 10*3/uL (ref 0.7–4.0)
MCH: 28.3 pg (ref 26.0–34.0)
MCHC: 32.3 g/dL (ref 30.0–36.0)
MCV: 87.6 fL (ref 80.0–100.0)
Monocytes Absolute: 0.8 10*3/uL (ref 0.1–1.0)
Monocytes Relative: 10 %
Neutro Abs: 4.4 10*3/uL (ref 1.7–7.7)
Neutrophils Relative %: 55 %
Platelet Count: 234 10*3/uL (ref 150–400)
RBC: 5.01 MIL/uL (ref 4.22–5.81)
RDW: 14.6 % (ref 11.5–15.5)
WBC Count: 7.9 10*3/uL (ref 4.0–10.5)
nRBC: 0 % (ref 0.0–0.2)

## 2022-08-22 NOTE — Progress Notes (Signed)
Hematology and Oncology Follow Up Visit  Barry Gibson [id number] 05/21/1961 61 y.o. 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   Prior Therapy:   He underwent robotic assisted laparoscopic radical cystectomy and lymphadenectomy January 2022.  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  He had 4 out of 7 lymph nodes involved with the final pathological staging is T4N2 disease.     He underwent staging scans on April 19, 2021 which showed interval progression of abdominal pelvic adenopathy consistent with worsening nodal metastasis.    Chemotherapy utilizing gemcitabine and cisplatin started on June 08, 2021 receiving day 1 cisplatin and gemcitabine with day 8 gemcitabine alone.  He completed cycle 4 on August 17, 2021.     Current therapy: Pembrolizumab 200 mg every 3 weeks started on September 07, 2021.  He is currently receiving 400 mg every 6 weeks.  Last treatment was given on July 19, 2022.   Interim History: Barry Gibson presents today for return evaluation.  Since the last visit, he reported a few complaints.  He has reported fatigue and tiredness and occasional weakness.  He had reported some arthralgias and myalgias and required a short course of prednisone to improve his symptoms.  He feels better in the last 24 hours without any other complaints.  He denies any fevers, chills or sweats.  He has reported some nausea which responded to Compazine.     Medications: Updated on review. Current Outpatient Medications  Medication Sig Dispense Refill   acetaminophen (TYLENOL) 650 MG CR tablet Take 1,300 mg by mouth every morning.     amLODipine (NORVASC) 10 MG tablet TAKE 1 TABLET(10 MG) BY  MOUTH DAILY 90 tablet 1   ciprofloxacin (CIPRO) 500 MG tablet Take 1 tablet (500 mg total) by mouth 2 (two) times daily. 14 tablet 1   lidocaine-prilocaine (EMLA) cream Apply 1 application topically as needed. 30 g 0   Multiple Vitamin (MULTIVITAMIN WITH MINERALS) TABS tablet Take 1 tablet by mouth daily.     omeprazole (PRILOSEC) 40 MG capsule Take 40 mg by mouth daily.     prednisoLONE acetate (PRED FORTE) 1 % ophthalmic suspension Place 1 drop into the left eye 2 (two) times a week. Instill 1 drop into left eye Monday and Friday.     prochlorperazine (COMPAZINE) 10 MG tablet TAKE 1 TABLET(10 MG) BY MOUTH EVERY 6 HOURS AS NEEDED FOR NAUSEA OR VOMITING 90 tablet 2   rosuvastatin (CRESTOR) 20 MG tablet TAKE 1 TABLET(20 MG) BY MOUTH DAILY 90 tablet 1   sertraline (ZOLOFT) 50 MG tablet TAKE 1 TABLET(50 MG) BY MOUTH DAILY 30 tablet 0   sildenafil (VIAGRA) 100 MG tablet SMARTSIG:1 Tablet(s) By Mouth     No current facility-administered medications for this visit.   Facility-Administered Medications Ordered in Other Visits  Medication Dose Route Frequency Provider Last Rate Last Admin   acetaminophen (TYLENOL) 325 MG tablet            diphenhydrAMINE (BENADRYL) 25 mg capsule            gemcitabine (GEMZAR) chemo syringe for bladder instillation 2,000 mg  2,000 mg Bladder Instillation Once Barry Gallo, MD       gemcitabine [facility]) chemo syringe for bladder instillation 2,000 mg  2,000 mg Bladder Instillation Once Barry Gallo, MD         Allergies:  Allergies  Allergen Reactions   Ace Inhibitors Cough   Diflucan [Fluconazole] Other (See Comments)    Irritated ulcers      Physical Exam:      Blood pressure 102/75, pulse 72, temperature 98.1 F (36.7 C), temperature source Temporal, resp. rate 16, height '5\' 11"'$  (1.803 m), weight 208 lb (94.3 kg), SpO2 99 %.        ECOG: 0    General appearance: Comfortable appearing without any discomfort Head: Normocephalic  without any trauma Oropharynx: Mucous membranes are moist and pink without any thrush or ulcers. Eyes: Pupils are equal and round reactive to light. Lymph nodes: No cervical, supraclavicular, inguinal or axillary lymphadenopathy.   Heart:regular rate and rhythm.  S1 and S2 without leg edema. Lung: Clear without any rhonchi or wheezes.  No dullness to percussion. Abdomin: Soft, nontender, nondistended with good bowel sounds.  No hepatosplenomegaly. Musculoskeletal: No joint deformity or effusion.  Full range of motion noted. Neurological: No deficits noted on motor, sensory and deep tendon reflex exam. Skin: No petechial rash or dryness.  Appeared moist.              Lab Results: Lab Results  Component Value Date   WBC 6.3 07/19/2022   HGB 14.2 07/19/2022   HCT 42.8 07/19/2022   MCV 85.9 07/19/2022   PLT 171 07/19/2022   PSA 0.53 11/25/2014     Chemistry      Component Value Date/Time   NA 138 07/19/2022 1431   NA 141 11/02/2021 1512   K 3.7 07/19/2022 1431   CL 108 07/19/2022 1431   CO2 26 07/19/2022 1431   BUN 23 (H) 07/19/2022 1431   BUN 23 11/02/2021 1512   CREATININE 1.60 (H) 07/19/2022 1431   CREATININE 1.05 11/25/2014 0826      Component Value Date/Time   CALCIUM 9.2 07/19/2022 1431   ALKPHOS 71 07/19/2022 1431   AST 14 (L) 07/19/2022 1431   ALT 11 07/19/2022 1431   BILITOT 0.5 07/19/2022 1431          Impression and Plan:  61 year old man with:  1.  Stage IV high-grade urothelial carcinoma of the bladder with pelvic adenopathy diagnosed in January 2022.   His disease status was updated including reviewing his previous imaging studies as well as current therapy.  He has enlarging nodular disease as well as lymph node although the bulk of the tumor remains very limited.  It is unclear whether this cancer is contributing to his symptoms but appears to be less likely given the low volume disease.  Alternative treatment options other than  immunotherapy including antibody drug conjugate a possible spot radiation therapy due to enlarging disease area.  After discussion today, we opted to hold his Pembrolizumab treatment next week and will reevaluate in the next 4 weeks.  We will update his staging scan and determine best course of action accordingly.  Restarting Pembrolizumab versus alternative treatment options such as Padcev or radiation therapy could be considered.    2.  Urinary tract infection: He continues to have recurrent infections related to his urostomy.  3.  Diffuse arthralgias and weakness: Likely autoimmune in nature.  Treatment break from Pembrolizumab should help.  Retreatment with prednisone could be also considered.       4.  Follow-up: In the next 4 to 6 weeks for repeat follow-up.   30  minutes were  dedicated to this visit.  The time was spent on updating his disease status, treatment choices and outlining future plan of care discussion.   Zola Button, MD 10/5/20233:08 PM

## 2022-08-23 LAB — TSH: TSH: 2.025 u[IU]/mL (ref 0.350–4.500)

## 2022-08-24 ENCOUNTER — Other Ambulatory Visit: Payer: Self-pay

## 2022-08-25 ENCOUNTER — Other Ambulatory Visit: Payer: Self-pay

## 2022-08-27 ENCOUNTER — Other Ambulatory Visit: Payer: Self-pay

## 2022-08-28 LAB — LIPID PANEL
Chol/HDL Ratio: 3.7 ratio (ref 0.0–5.0)
Cholesterol, Total: 145 mg/dL (ref 100–199)
HDL: 39 mg/dL — ABNORMAL LOW (ref >39)
LDL Chol Calc (NIH): 87 mg/dL (ref 0–99)
Triglycerides: 101 mg/dL (ref 0–149)
VLDL Cholesterol Cal: 19 mg/dL (ref 5–40)

## 2022-08-28 LAB — MICROALBUMIN / CREATININE URINE RATIO
Creatinine, Urine: 97.8 mg/dL
Microalb/Creat Ratio: 68 mg/g creat — ABNORMAL HIGH (ref 0–29)
Microalbumin, Urine: 66.1 ug/mL

## 2022-08-29 ENCOUNTER — Other Ambulatory Visit: Payer: Self-pay

## 2022-08-29 ENCOUNTER — Ambulatory Visit: Payer: BC Managed Care – PPO | Admitting: Family Medicine

## 2022-08-29 DIAGNOSIS — F419 Anxiety disorder, unspecified: Secondary | ICD-10-CM

## 2022-08-29 DIAGNOSIS — F32A Depression, unspecified: Secondary | ICD-10-CM

## 2022-08-29 MED ORDER — SERTRALINE HCL 50 MG PO TABS: ORAL_TABLET | ORAL | 3 refills | Status: DC

## 2022-08-30 ENCOUNTER — Ambulatory Visit: Payer: BC Managed Care – PPO | Admitting: Oncology

## 2022-08-30 ENCOUNTER — Ambulatory Visit: Payer: BC Managed Care – PPO

## 2022-08-30 ENCOUNTER — Other Ambulatory Visit: Payer: BC Managed Care – PPO

## 2022-09-02 ENCOUNTER — Encounter: Payer: Self-pay | Admitting: Family Medicine

## 2022-09-02 ENCOUNTER — Ambulatory Visit (INDEPENDENT_AMBULATORY_CARE_PROVIDER_SITE_OTHER): Payer: BC Managed Care – PPO | Admitting: Family Medicine

## 2022-09-02 VITALS — BP 122/78 | HR 77 | Temp 97.5°F | Wt 211.6 lb

## 2022-09-02 DIAGNOSIS — I714 Abdominal aortic aneurysm, without rupture, unspecified: Secondary | ICD-10-CM | POA: Insufficient documentation

## 2022-09-02 DIAGNOSIS — I10 Essential (primary) hypertension: Secondary | ICD-10-CM | POA: Diagnosis not present

## 2022-09-02 DIAGNOSIS — I7143 Infrarenal abdominal aortic aneurysm, without rupture: Secondary | ICD-10-CM

## 2022-09-02 DIAGNOSIS — N1831 Chronic kidney disease, stage 3a: Secondary | ICD-10-CM

## 2022-09-02 MED ORDER — LOSARTAN POTASSIUM 25 MG PO TABS: 25.0000 mg | ORAL_TABLET | Freq: Every day | ORAL | 3 refills | Status: DC

## 2022-09-02 MED ORDER — AMLODIPINE BESYLATE 5 MG PO TABS: 5.0000 mg | ORAL_TABLET | Freq: Every day | ORAL | 3 refills | Status: DC

## 2022-09-02 NOTE — Patient Instructions (Signed)
Medications as we discussed.  Lab in the next 10-14 days.  Follow up in 6 months.

## 2022-09-03 ENCOUNTER — Other Ambulatory Visit: Payer: Self-pay

## 2022-09-03 NOTE — Assessment & Plan Note (Signed)
Hypertension well controlled.  Decreasing amlodipine and adding losartan given proteinuria.

## 2022-09-03 NOTE — Progress Notes (Signed)
Subjective:  Patient ID: Barry Gibson, male    DOB: 29-Oct-1961  Age: 61 y.o. MRN: [id number]  CC: Chief Complaint  Patient presents with   Annual Exam    Pt arrives for physical. No concerns at this time.     HPI:  61 year old male with bladder cancer (being followed by oncology), hypertension, rheumatoid arthritis, chronic kidney disease presents for follow-up.  Patient states that overall he is doing fairly well.  He states that he is having a good day today.  He is being followed closely by oncology.  He has an upcoming scan.  Hypertension is well controlled on amlodipine.  Renal function has been stable.  However, he is experiencing elevated microalbumin to creatinine ratio.  We will discuss today.  Lipid stable on Crestor.  Patient Active Problem List   Diagnosis Date Noted   AAA (abdominal aortic aneurysm) ([place]) 09/02/2022   CKD (chronic kidney disease) stage 3, GFR 30-59 ml/min ([facility]) 08/19/2022   GERD (gastroesophageal reflux disease) 11/17/2021   Depression 11/17/2021   Port-A-Cath in place 07/27/2021   History of peptic ulcer 09/24/2020   Bladder cancer ([place]) 03/06/2020   Rheumatoid arthritis ([place]) 04/07/2018   Essential hypertension, benign 02/05/2013   Hyperlipidemia 02/04/2013    Social Hx   Social History   Socioeconomic History   Marital status: Married    Spouse name: Not on file   Number of children: Not on file   Years of education: Not on file   Highest education level: Not on file  Occupational History   Not on file  Tobacco Use   Smoking status: Former    Packs/day: 0.50    Years: 20.00    Total pack years: 10.00    Types: Cigarettes   Smokeless tobacco: Never   Tobacco comments:    quit 2010  Vaping Use   Vaping Use: Never used  Substance and Sexual Activity   Alcohol use: Yes    Comment: occ  2 x month   Drug use: No   Sexual activity: Yes  Other Topics Concern   Not on file  Social History Narrative   Not on file    Social Determinants of Health   Financial Resource Strain: Not on file  Food Insecurity: Not on file  Transportation Needs: Not on file  Physical Activity: Not on file  Stress: Not on file  Social Connections: Not on file    Review of Systems  Constitutional: Negative.   Respiratory: Negative.    Cardiovascular: Negative.     Objective:  BP 122/78   Pulse 77   Temp (!) 97.5 F (36.4 C)   Wt 211 lb 9.6 oz (96 kg)   SpO2 96%   BMI 29.51 kg/m      09/02/2022    1:13 PM 08/22/2022    3:38 PM 07/19/2022    2:46 PM  BP/Weight  Systolic BP 962 952 841  Diastolic BP 78 75 79  Wt. (Lbs) 211.6 208 211.4  BMI 29.51 kg/m2 29.01 kg/m2 29.48 kg/m2    Physical Exam Vitals and nursing note reviewed.  Constitutional:      General: He is not in acute distress.    Appearance: Normal appearance.  HENT:     Head: Normocephalic and atraumatic.  Eyes:     General:        Right eye: No discharge.        Left eye: No discharge.     Conjunctiva/sclera: Conjunctivae normal.  Cardiovascular:  Rate and Rhythm: Normal rate and regular rhythm.  Pulmonary:     Effort: Pulmonary effort is normal.     Breath sounds: Normal breath sounds. No wheezing, rhonchi or rales.  Neurological:     Mental Status: He is alert.  Psychiatric:        Mood and Affect: Mood normal.        Behavior: Behavior normal.     Lab Results  Component Value Date   WBC 7.9 08/22/2022   HGB 14.2 08/22/2022   HCT 43.9 08/22/2022   PLT 234 08/22/2022   GLUCOSE 91 08/22/2022   CHOL 145 08/27/2022   TRIG 101 08/27/2022   HDL 39 (L) 08/27/2022   LDLCALC 87 08/27/2022   ALT 9 08/22/2022   AST 13 (L) 08/22/2022   NA 139 08/22/2022   K 4.4 08/22/2022   CL 105 08/22/2022   CREATININE 1.68 (H) 08/22/2022   BUN 19 08/22/2022   CO2 30 08/22/2022   TSH 2.025 08/22/2022   PSA 0.53 11/25/2014   INR 1.1 11/16/2021     Assessment & Plan:   Problem List Items Addressed This Visit       Cardiovascular  and Mediastinum   Essential hypertension, benign - Primary    Hypertension well controlled.  Decreasing amlodipine and adding losartan given proteinuria.      Relevant Medications   amLODipine (NORVASC) 5 MG tablet   losartan (COZAAR) 25 MG tablet     Genitourinary   CKD (chronic kidney disease) stage 3, GFR 30-59 ml/min (HCC)    Having proteinuria.  Adding losartan today.      Relevant Orders   Basic Metabolic Panel    Meds ordered this encounter  Medications   amLODipine (NORVASC) 5 MG tablet    Sig: Take 1 tablet (5 mg total) by mouth daily.    Dispense:  90 tablet    Refill:  3   losartan (COZAAR) 25 MG tablet    Sig: Take 1 tablet (25 mg total) by mouth daily.    Dispense:  90 tablet    Refill:  3    Follow-up:  Return in about 6 months (around 03/04/2023).  [place]

## 2022-09-03 NOTE — Assessment & Plan Note (Signed)
Having proteinuria.  Adding losartan today.

## 2022-09-11 ENCOUNTER — Inpatient Hospital Stay: Payer: BC Managed Care – PPO

## 2022-09-11 ENCOUNTER — Other Ambulatory Visit: Payer: Self-pay | Admitting: *Deleted

## 2022-09-11 ENCOUNTER — Telehealth: Payer: Self-pay | Admitting: *Deleted

## 2022-09-11 ENCOUNTER — Inpatient Hospital Stay (HOSPITAL_BASED_OUTPATIENT_CLINIC_OR_DEPARTMENT_OTHER): Payer: BC Managed Care – PPO | Admitting: Physician Assistant

## 2022-09-11 ENCOUNTER — Ambulatory Visit (HOSPITAL_COMMUNITY)
Admission: RE | Admit: 2022-09-11 | Discharge: 2022-09-11 | Disposition: A | Discharge status: Home / Self Care | Payer: BC Managed Care – PPO | Source: Ambulatory Visit | Attending: Physician Assistant | Admitting: Physician Assistant

## 2022-09-11 ENCOUNTER — Other Ambulatory Visit: Payer: Self-pay | Admitting: Physician Assistant

## 2022-09-11 ENCOUNTER — Other Ambulatory Visit: Payer: Self-pay

## 2022-09-11 VITALS — BP 98/64 | HR 75 | Temp 97.5°F | Resp 18 | Wt 207.6 lb

## 2022-09-11 DIAGNOSIS — C679 Malignant neoplasm of bladder, unspecified: Secondary | ICD-10-CM

## 2022-09-11 DIAGNOSIS — C670 Malignant neoplasm of trigone of bladder: Secondary | ICD-10-CM

## 2022-09-11 DIAGNOSIS — R509 Fever, unspecified: Secondary | ICD-10-CM | POA: Insufficient documentation

## 2022-09-11 DIAGNOSIS — Z95828 Presence of other vascular implants and grafts: Secondary | ICD-10-CM | POA: Diagnosis not present

## 2022-09-11 DIAGNOSIS — N390 Urinary tract infection, site not specified: Secondary | ICD-10-CM

## 2022-09-11 LAB — CBC WITH DIFFERENTIAL (CANCER CENTER ONLY)
Abs Immature Granulocytes: 0.05 10*3/uL (ref 0.00–0.07)
Basophils Absolute: 0.1 10*3/uL (ref 0.0–0.1)
Basophils Relative: 1 %
Eosinophils Absolute: 0.3 10*3/uL (ref 0.0–0.5)
Eosinophils Relative: 3 %
HCT: 44 % (ref 39.0–52.0)
Hemoglobin: 14.7 g/dL (ref 13.0–17.0)
Immature Granulocytes: 0 %
Lymphocytes Relative: 17 %
Lymphs Abs: 1.9 10*3/uL (ref 0.7–4.0)
MCH: 29.1 pg (ref 26.0–34.0)
MCHC: 33.4 g/dL (ref 30.0–36.0)
MCV: 87 fL (ref 80.0–100.0)
Monocytes Absolute: 1.1 10*3/uL — ABNORMAL HIGH (ref 0.1–1.0)
Monocytes Relative: 10 %
Neutro Abs: 7.9 10*3/uL — ABNORMAL HIGH (ref 1.7–7.7)
Neutrophils Relative %: 69 %
Platelet Count: 185 10*3/uL (ref 150–400)
RBC: 5.06 MIL/uL (ref 4.22–5.81)
RDW: 14.6 % (ref 11.5–15.5)
WBC Count: 11.3 10*3/uL — ABNORMAL HIGH (ref 4.0–10.5)
nRBC: 0 % (ref 0.0–0.2)

## 2022-09-11 LAB — URINALYSIS, COMPLETE (UACMP) WITH MICROSCOPIC
Bilirubin Urine: NEGATIVE
Glucose, UA: NEGATIVE mg/dL
Ketones, ur: 5 mg/dL — AB
Nitrite: POSITIVE — AB
Protein, ur: 30 mg/dL — AB
Specific Gravity, Urine: 1.018 (ref 1.005–1.030)
WBC, UA: >50 WBC/hpf — ABNORMAL HIGH (ref 0–5)
pH: 5 (ref 5.0–8.0)

## 2022-09-11 LAB — CMP (CANCER CENTER ONLY)
ALT: 12 U/L (ref 0–44)
AST: 19 U/L (ref 15–41)
Albumin: 3.7 g/dL (ref 3.5–5.0)
Alkaline Phosphatase: 126 U/L (ref 38–126)
Anion gap: 10 (ref 5–15)
BUN: 21 mg/dL — ABNORMAL HIGH (ref 6–20)
CO2: 24 mmol/L (ref 22–32)
Calcium: 9.2 mg/dL (ref 8.9–10.3)
Chloride: 100 mmol/L (ref 98–111)
Creatinine: 1.48 mg/dL — ABNORMAL HIGH (ref 0.61–1.24)
GFR, Estimated: 54 mL/min — ABNORMAL LOW (ref >60)
Glucose, Bld: 66 mg/dL — ABNORMAL LOW (ref 70–99)
Potassium: 3.8 mmol/L (ref 3.5–5.1)
Sodium: 134 mmol/L — ABNORMAL LOW (ref 135–145)
Total Bilirubin: 1.1 mg/dL (ref 0.3–1.2)
Total Protein: 6.9 g/dL (ref 6.5–8.1)

## 2022-09-11 MED ORDER — SODIUM CHLORIDE 0.9 % IV SOLN: Freq: Once | INTRAVENOUS | Status: AC

## 2022-09-11 MED ORDER — HEPARIN SOD (PORK) LOCK FLUSH 100 UNIT/ML IV SOLN: 500.0000 [IU] | Freq: Once | INTRAVENOUS | Status: DC

## 2022-09-11 MED ORDER — CIPROFLOXACIN HCL 500 MG PO TABS: 500.0000 mg | ORAL_TABLET | Freq: Two times a day (BID) | ORAL | 0 refills | Status: DC

## 2022-09-11 MED ORDER — DEXTROSE 5 % IV SOLN
1.0000 g | Freq: Once | INTRAVENOUS | Status: AC
  Administered 2022-09-11: 1 g via INTRAVENOUS
  Filled 2022-09-11: qty 10

## 2022-09-11 MED ORDER — SODIUM CHLORIDE 0.9% FLUSH
10.0000 mL | Freq: Once | INTRAVENOUS | Status: AC
  Administered 2022-09-11: 10 mL

## 2022-09-11 NOTE — Progress Notes (Signed)
Symptom Management Consult note [place]    Patient Care Team: Coral Spikes, DO as PCP - General (Family Medicine)    Name of the patient: Barry Gibson  [id number]  Apr 29, 1961   Date of visit: 09/11/2022   Chief Complaint/Reason for visit: fever   Current Therapy: Pembrolizumab  Last treatment:  Day 1   Cycle 8 on 07/19/22   ASSESSMENT & PLAN: Patient is a 61 y.o. 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 Alen Blew.  I have viewed most recent oncology note and lab work.    #) Stage IV (T4N2) high-grade urothelial carcinoma with pelvic and abdominal adenopathy  -Last treatment held as patient having arthralgias and myalgias requiring short course of steroids. Plan was for restaging scans before next office visit to determine best course of action. - Next appointment with oncologist is 09/27/22   #) UTI w/ fever -Patient is ill appearing although non toxic. Afebrile in clinic, took tylenol PTA. BP soft of arrival 98/64. Repeat BP 102/61. -Fever work up with labs, blood culture, chest xray. IVF given her in clinic. -UA is suggestive of UTI, culture sent. Patient given dose of IV Rocephin in office. -CBC with leukocytosis 11.3, CMP without significant electrolyte derangement, creatinine similar to baseline. -Will treat with cipro based on previous cultures. Confirmed with patient and spouse that he tolerated antibiotic well last time. -Chest xray viewed by me is negative for acute infectious processes. I agree with radiologist impression.   #) Hypoglycemia -CMP showing glucose 66 suspect related to decreased PO intake. Patient has not eaten yet today. He was given a soda and ate a few crackers. -Rechecked glucose and it is 71. Patient had only take a few sips of soda. He was given orange juice and drank all of it. He has normal mentation and will continue to push  fluids at home.   Strict ED precautions discussed should symptoms worsen.   Heme/Onc History: Oncology History  Bladder cancer ([place])  03/06/2020 Initial Diagnosis   Bladder cancer ([place])   05/18/2021 Cancer Staging   Staging form: Urinary Bladder, AJCC 8th Edition - Clinical: Stage IVB (cT4, cN2, cM1b) - Signed by Wyatt Portela, MD on 05/18/2021 WHO/ISUP grade (low/high): High Grade Histologic grading system: 2 grade system   06/08/2021 - 08/17/2021 Chemotherapy   Patient is on Treatment Plan : BLADDER Cisplatin D1 + Gemcitabine D1,8 q21d x 6 Cycles     09/07/2021 - 06/07/2022 Chemotherapy   Patient is on Treatment Plan : HEAD/NECK Pembrolizumab      09/07/2021 -  Chemotherapy   Patient is on Treatment Plan : ANUS Pembrolizumab (400) q42d         Interval history-: Barry Gibson is a 61 y.o. male with oncologic history as above presenting to [facility] today with chief complaint of fever and feeling unwell x5 days.  Patient is accompanied by his spouse who provides additional history.  Spouse reports since early September when his treatment was held patient has been feeling unwell overall with body aches.  She states over the weekend she noticed he was not feeling well. Also endorsing fatigue and decreased appetite.  Patient had Tmax of 102 last night.  He was febrile to 101 this morning and took Tylenol prior to arrival.  Patient has had persistent nausea over the last several days. He had 1 episode of vomiting yesterday. He has a  prescription for nausea medicine ready to pick up at the pharmacy. He has had decrease fluid intake the last few days as well as urine output. Urine might be slightly darker in color he thinks. He denies any sick contacts. He has history of UTI w/ most recent being in 05/2022.       ROS  All other systems are reviewed and are negative for acute change except as noted in the HPI.    Allergies  Allergen Reactions   Ace Inhibitors Cough   Diflucan  [Fluconazole] Other (See Comments)    Irritated ulcers     Past Medical History:  Diagnosis Date   Cancer ([place])    bladder   Cataract    COVID-19 11/13/2019   h/a, chills, fatigue, loss of taste and smell, all symptoms resolved in a few weeks   Frequent urination    Fuchs' corneal dystrophy    Full dentures    GERD (gastroesophageal reflux disease)    Hypertension    Rheumatoid arthritis ([place])      Past Surgical History:  Procedure Laterality Date   CORNEAL TRANSPLANT Left 2013   CYSTOSCOPY WITH BIOPSY N/A 09/14/2019   Procedure: CYSTOSCOPY WITH BIOPSY;  Surgeon: Franchot Gallo, MD;  Location: [facility];  Service: Urology;  Laterality: N/A;   CYSTOSCOPY WITH INJECTION N/A 11/29/2020   Procedure: CYSTOSCOPY WITH INJECTION OF INDOCYANINE GREEN DYE;  Surgeon: Alexis Frock, MD;  Location: [facility];  Service: Urology;  Laterality: N/A;  6 HRS   FULGURATION OF BLADDER TUMOR N/A 09/14/2019   Procedure: FULGURATION OF BLADDER TUMOR;  Surgeon: Franchot Gallo, MD;  Location: [facility];  Service: Urology;  Laterality: N/A;   IR IMAGING GUIDED PORT INSERTION  06/07/2021   IR NEPHROSTOMY EXCHANGE LEFT  10/30/2020   IR NEPHROSTOMY EXCHANGE LEFT  11/19/2020   IR NEPHROSTOMY EXCHANGE RIGHT  10/30/2020   IR NEPHROSTOMY EXCHANGE RIGHT  11/19/2020   IR NEPHROSTOMY PLACEMENT LEFT  09/27/2020   IR NEPHROSTOMY PLACEMENT RIGHT  09/27/2020   ROBOT ASSISTED LAPAROSCOPIC COMPLETE CYSTECT ILEAL CONDUIT N/A 11/29/2020   Procedure: XI ROBOTIC ASSISTED LAPAROSCOPIC COMPLETE CYSTECT ILEAL CONDUIT/RADICAL PROSTATECTOMY/LYMPHADENECTOMY;  Surgeon: Alexis Frock, MD;  Location: [facility];  Service: Urology;  Laterality: N/A;   TRANSURETHRAL RESECTION OF BLADDER TUMOR WITH MITOMYCIN-C N/A 08/05/2019   Procedure: TRANSURETHRAL RESECTION OF BLADDER TUMOR;  Surgeon: Franchot Gallo, MD;  Location: [facility];  Service: Urology;  Laterality: N/A;  1 HR   TRANSURETHRAL RESECTION OF BLADDER TUMOR WITH  MITOMYCIN-C N/A 04/24/2020   Procedure: TRANSURETHRAL RESECTION OF BLADDER TUMOR WITH GEMCIDABINE;  Surgeon: Franchot Gallo, MD;  Location: [facility];  Service: Urology;  Laterality: N/A;    Social History   Socioeconomic History   Marital status: Married    Spouse name: Not on file   Number of children: Not on file   Years of education: Not on file   Highest education level: Not on file  Occupational History   Not on file  Tobacco Use   Smoking status: Former    Packs/day: 0.50    Years: 20.00    Total pack years: 10.00    Types: Cigarettes   Smokeless tobacco: Never   Tobacco comments:    quit 2010  Vaping Use   Vaping Use: Never used  Substance and Sexual Activity   Alcohol use: Yes    Comment: occ  2 x month   Drug use: No   Sexual activity: Yes  Other Topics Concern  Not on file  Social History Narrative   Not on file   Social Determinants of Health   Financial Resource Strain: Not on file  Food Insecurity: Not on file  Transportation Needs: Not on file  Physical Activity: Not on file  Stress: Not on file  Social Connections: Not on file  Intimate Partner Violence: Not on file    No family history on file.   Current Outpatient Medications:    ciprofloxacin (CIPRO) 500 MG tablet, Take 1 tablet (500 mg total) by mouth 2 (two) times daily for 7 days., Disp: 14 tablet, Rfl: 0   acetaminophen (TYLENOL) 650 MG CR tablet, Take 1,300 mg by mouth every morning., Disp: , Rfl:    amLODipine (NORVASC) 5 MG tablet, Take 1 tablet (5 mg total) by mouth daily., Disp: 90 tablet, Rfl: 3   losartan (COZAAR) 25 MG tablet, Take 1 tablet (25 mg total) by mouth daily., Disp: 90 tablet, Rfl: 3   Multiple Vitamin (MULTIVITAMIN WITH MINERALS) TABS tablet, Take 1 tablet by mouth daily., Disp: , Rfl:    omeprazole (PRILOSEC) 40 MG capsule, Take 40 mg by mouth daily., Disp: , Rfl:    prednisoLONE acetate (PRED FORTE) 1 % ophthalmic suspension, Place 1 drop into the  left eye 2 (two) times a week. Instill 1 drop into left eye Monday and Friday., Disp: , Rfl:    rosuvastatin (CRESTOR) 20 MG tablet, TAKE 1 TABLET(20 MG) BY MOUTH DAILY, Disp: 90 tablet, Rfl: 1   sertraline (ZOLOFT) 50 MG tablet, TAKE 1 TABLET(50 MG) BY MOUTH DAILY, Disp: 90 tablet, Rfl: 3 No current facility-administered medications for this visit.  Facility-Administered Medications Ordered in Other Visits:    acetaminophen (TYLENOL) 325 MG tablet, , , ,    diphenhydrAMINE (BENADRYL) 25 mg capsule, , , ,    gemcitabine (GEMZAR) chemo syringe for bladder instillation 2,000 mg, 2,000 mg, Bladder Instillation, Once, Franchot Gallo, MD   gemcitabine [facility]) chemo syringe for bladder instillation 2,000 mg, 2,000 mg, Bladder Instillation, Once, Franchot Gallo, MD  PHYSICAL EXAM: ECOG FS:1 - Symptomatic but completely ambulatory    Vitals:   09/11/22 1157 09/11/22 1252  BP:  98/64  Pulse:  75  Resp:  18  Temp:  (!) 97.5 F (36.4 C)  TempSrc:  Oral  SpO2:  95%  Weight: 207 lb 9.6 oz (94.2 kg)    Physical Exam Vitals and nursing note reviewed.  Constitutional:      General: He is not in acute distress.    Appearance: He is well-developed. He is ill-appearing. He is not toxic-appearing or diaphoretic.  HENT:     Head: Normocephalic.     Nose: Nose normal.  Eyes:     Conjunctiva/sclera: Conjunctivae normal.  Neck:     Vascular: No JVD.  Cardiovascular:     Rate and Rhythm: Normal rate and regular rhythm.     Pulses: Normal pulses.     Heart sounds: Normal heart sounds.  Pulmonary:     Effort: Pulmonary effort is normal.     Breath sounds: Normal breath sounds.  Chest:     Comments: Port in right upper chest without surrounding erythema. No signs of infection Abdominal:     General: There is no distension.     Palpations: Abdomen is soft. There is no mass.     Tenderness: There is no abdominal tenderness. There is no right CVA tenderness, left CVA tenderness, guarding  or rebound.     Hernia: No hernia is  present.     Comments: Urostomy bag empty  Musculoskeletal:     Cervical back: Normal range of motion.  Skin:    General: Skin is warm and dry.  Neurological:     Mental Status: He is oriented to person, place, and time.        LABORATORY DATA: I have reviewed the data as listed    Latest Ref Rng & Units 09/11/2022   11:34 AM 08/22/2022    3:21 PM 07/19/2022    2:31 PM  CBC  WBC 4.0 - 10.5 K/uL 11.3  7.9  6.3   Hemoglobin 13.0 - 17.0 g/dL 14.7  14.2  14.2   Hematocrit 39.0 - 52.0 % 44.0  43.9  42.8   Platelets 150 - 400 K/uL 185  234  171         Latest Ref Rng & Units 09/11/2022   11:34 AM 08/22/2022    3:21 PM 07/19/2022    2:31 PM  CMP  Glucose 70 - 99 mg/dL 66  91  105   BUN 6 - 20 mg/dL '21  19  23   '$ Creatinine 0.61 - 1.24 mg/dL 1.48  1.68  1.60   Sodium 135 - 145 mmol/L 134  139  138   Potassium 3.5 - 5.1 mmol/L 3.8  4.4  3.7   Chloride 98 - 111 mmol/L 100  105  108   CO2 22 - 32 mmol/L '24  30  26   '$ Calcium 8.9 - 10.3 mg/dL 9.2  9.0  9.2   Total Protein 6.5 - 8.1 g/dL 6.9  6.9  6.8   Total Bilirubin 0.3 - 1.2 mg/dL 1.1  0.4  0.5   Alkaline Phos 38 - 126 U/L 126  106  71   AST 15 - 41 U/L '19  13  14   '$ ALT 0 - 44 U/L '12  9  11        '$ RADIOGRAPHIC STUDIES (from last 24 hours if applicable) I have personally reviewed the radiological images as listed and agreed with the findings in the report. DG Chest 2 View  Result Date: 09/11/2022 CLINICAL DATA:  Fever, nausea, vomiting EXAM: CHEST - 2 VIEW COMPARISON:  Previous chest radiograph done on 11/16/2021 and CT done on 06/17/2022 FINDINGS: Cardiac size is within normal limits. There are no signs of pulmonary edema or focal pulmonary consolidation. There is no pleural effusion or pneumothorax. Tip of right IJ chest port is seen in superior vena cava close to the right atrium. IMPRESSION: No active cardiopulmonary disease. Electronically Signed   By: Elmer Picker M.D.   On:  09/11/2022 15:55        Visit Diagnosis: 1. Fever, unspecified   2. Urinary tract infection without hematuria, site unspecified   3. Port-A-Cath in place   4. Malignant neoplasm of trigone of urinary bladder ([place])      Orders Placed This Encounter  Procedures   Blood culture (routine single)    Standing Status:   Standing    Number of Occurrences:   1    All questions were answered. The patient knows to call the clinic with any problems, questions or concerns. No barriers to learning was detected.  I have spent a total of 30 minutes minutes of face-to-face and non-face-to-face time, preparing to see the patient, obtaining and/or reviewing separately obtained history, performing a medically appropriate examination, counseling and educating the patient, ordering tests, documenting clinical information in the electronic health record, and  care coordination (communications with other health care professionals or caregivers).    Thank you for allowing me to participate in the care of this patient.    Barrie Folk, PA-C Department of Hematology/Oncology [facility] at [facility] Phone: [phone]  [phone]    09/11/2022 4:02 PM

## 2022-09-11 NOTE — Telephone Encounter (Signed)
Received phone call from patient's wife, Barnett Applebaum - patient has been sick for the past 2 days, nauseated & very weak, has had some vomiting, had fever of 102 last night, 101 this morning.  Per Dr Alen Blew, patient to be seen in [facility].  [facility] appointment is 11:00, returned PC to [place], she verbalizes understanding.

## 2022-09-13 LAB — URINE CULTURE: Culture: 70000 — AB

## 2022-09-16 ENCOUNTER — Telehealth: Payer: Self-pay | Admitting: *Deleted

## 2022-09-16 ENCOUNTER — Other Ambulatory Visit: Payer: Self-pay

## 2022-09-16 ENCOUNTER — Encounter (HOSPITAL_COMMUNITY): Payer: Self-pay

## 2022-09-16 ENCOUNTER — Emergency Department (HOSPITAL_COMMUNITY): Payer: BC Managed Care – PPO

## 2022-09-16 ENCOUNTER — Emergency Department (HOSPITAL_COMMUNITY)
Admission: EM | Admit: 2022-09-16 | Discharge: 2022-09-16 | Disposition: A | Discharge status: Home / Self Care | Payer: BC Managed Care – PPO | Attending: Emergency Medicine | Admitting: Emergency Medicine

## 2022-09-16 DIAGNOSIS — E860 Dehydration: Secondary | ICD-10-CM | POA: Diagnosis not present

## 2022-09-16 DIAGNOSIS — Z1152 Encounter for screening for COVID-19: Secondary | ICD-10-CM | POA: Insufficient documentation

## 2022-09-16 DIAGNOSIS — R112 Nausea with vomiting, unspecified: Secondary | ICD-10-CM | POA: Diagnosis not present

## 2022-09-16 DIAGNOSIS — M549 Dorsalgia, unspecified: Secondary | ICD-10-CM | POA: Insufficient documentation

## 2022-09-16 DIAGNOSIS — R531 Weakness: Secondary | ICD-10-CM

## 2022-09-16 LAB — COMPREHENSIVE METABOLIC PANEL
ALT: 16 U/L (ref 0–44)
AST: 28 U/L (ref 15–41)
Albumin: 3.2 g/dL — ABNORMAL LOW (ref 3.5–5.0)
Alkaline Phosphatase: 158 U/L — ABNORMAL HIGH (ref 38–126)
Anion gap: 8 (ref 5–15)
BUN: 18 mg/dL (ref 6–20)
CO2: 23 mmol/L (ref 22–32)
Calcium: 9.1 mg/dL (ref 8.9–10.3)
Chloride: 103 mmol/L (ref 98–111)
Creatinine, Ser: 1.4 mg/dL — ABNORMAL HIGH (ref 0.61–1.24)
GFR, Estimated: 58 mL/min — ABNORMAL LOW (ref >60)
Glucose, Bld: 86 mg/dL (ref 70–99)
Potassium: 3.8 mmol/L (ref 3.5–5.1)
Sodium: 134 mmol/L — ABNORMAL LOW (ref 135–145)
Total Bilirubin: 0.7 mg/dL (ref 0.3–1.2)
Total Protein: 6.7 g/dL (ref 6.5–8.1)

## 2022-09-16 LAB — CBC WITH DIFFERENTIAL/PLATELET
Abs Immature Granulocytes: 0.04 10*3/uL (ref 0.00–0.07)
Basophils Absolute: 0.1 10*3/uL (ref 0.0–0.1)
Basophils Relative: 1 %
Eosinophils Absolute: 0.3 10*3/uL (ref 0.0–0.5)
Eosinophils Relative: 5 %
HCT: 42 % (ref 39.0–52.0)
Hemoglobin: 14.6 g/dL (ref 13.0–17.0)
Immature Granulocytes: 1 %
Lymphocytes Relative: 20 %
Lymphs Abs: 1.1 10*3/uL (ref 0.7–4.0)
MCH: 29.9 pg (ref 26.0–34.0)
MCHC: 34.8 g/dL (ref 30.0–36.0)
MCV: 85.9 fL (ref 80.0–100.0)
Monocytes Absolute: 0.5 10*3/uL (ref 0.1–1.0)
Monocytes Relative: 9 %
Neutro Abs: 3.6 10*3/uL (ref 1.7–7.7)
Neutrophils Relative %: 64 %
Platelets: 204 10*3/uL (ref 150–400)
RBC: 4.89 MIL/uL (ref 4.22–5.81)
RDW: 14.2 % (ref 11.5–15.5)
WBC: 5.6 10*3/uL (ref 4.0–10.5)
nRBC: 0 % (ref 0.0–0.2)

## 2022-09-16 LAB — CBG MONITORING, ED: Glucose-Capillary: 98 mg/dL (ref 70–99)

## 2022-09-16 LAB — RESP PANEL BY RT-PCR (FLU A&B, COVID) ARPGX2
Influenza A by PCR: NEGATIVE
Influenza B by PCR: NEGATIVE
SARS Coronavirus 2 by RT PCR: NEGATIVE

## 2022-09-16 LAB — URINALYSIS, ROUTINE W REFLEX MICROSCOPIC
Bilirubin Urine: NEGATIVE
Glucose, UA: NEGATIVE mg/dL
Ketones, ur: NEGATIVE mg/dL
Nitrite: NEGATIVE
Protein, ur: 30 mg/dL — AB
Specific Gravity, Urine: 1.017 (ref 1.005–1.030)
pH: 5 (ref 5.0–8.0)

## 2022-09-16 LAB — CULTURE, BLOOD (SINGLE)
Culture: NO GROWTH
Culture: NO GROWTH

## 2022-09-16 LAB — TROPONIN I (HIGH SENSITIVITY)
Troponin I (High Sensitivity): 3 ng/L (ref <18)
Troponin I (High Sensitivity): 2 ng/L (ref <18)

## 2022-09-16 LAB — LACTIC ACID, PLASMA
Lactic Acid, Venous: 0.9 mmol/L (ref 0.5–1.9)
Lactic Acid, Venous: 0.9 mmol/L (ref 0.5–1.9)

## 2022-09-16 LAB — BRAIN NATRIURETIC PEPTIDE: B Natriuretic Peptide: 26 pg/mL (ref 0.0–100.0)

## 2022-09-16 MED ORDER — ONDANSETRON HCL 4 MG/2ML IJ SOLN
4.0000 mg | Freq: Once | INTRAMUSCULAR | Status: AC
  Administered 2022-09-16: 4 mg via INTRAVENOUS
  Filled 2022-09-16: qty 2

## 2022-09-16 MED ORDER — LACTATED RINGERS IV BOLUS
1000.0000 mL | Freq: Once | INTRAVENOUS | Status: AC
  Administered 2022-09-16: 1000 mL via INTRAVENOUS

## 2022-09-16 MED ORDER — HEPARIN SOD (PORK) LOCK FLUSH 100 UNIT/ML IV SOLN
500.0000 [IU] | Freq: Once | INTRAVENOUS | Status: AC
  Administered 2022-09-16: 500 [IU]
  Filled 2022-09-16: qty 5

## 2022-09-16 MED ORDER — IOHEXOL 350 MG/ML SOLN
100.0000 mL | Freq: Once | INTRAVENOUS | Status: AC | PRN
  Administered 2022-09-16: 100 mL via INTRAVENOUS

## 2022-09-16 MED ORDER — ONDANSETRON 4 MG PO TBDP: 4.0000 mg | ORAL_TABLET | Freq: Three times a day (TID) | ORAL | 0 refills | Status: DC | PRN

## 2022-09-16 MED ORDER — CIPROFLOXACIN HCL 500 MG PO TABS: 500.0000 mg | ORAL_TABLET | Freq: Two times a day (BID) | ORAL | 0 refills | Status: AC

## 2022-09-16 NOTE — Discharge Instructions (Signed)
Additional doses of your antibiotic were sent to your pharmacy.  Take as prescribed.  There is also a medication called ondansetron that was sent to your pharmacy.  This is a medication that treats nausea and vomiting.  Take this as needed.  Ensure that you follow-up with your outpatient providers as soon as possible to discuss CT imaging results from today as well as to follow-up on urine culture from today.  Return to the emergency department at any time for any new or worsening symptoms of concern.

## 2022-09-16 NOTE — ED Notes (Signed)
Right port-a-cath accessed by Lyn Henri, RN.

## 2022-09-16 NOTE — ED Notes (Signed)
Pt. O2 sat dropped to 87% on room air. Applied 2L [place] and o2 sat is now 94%.

## 2022-09-16 NOTE — ED Triage Notes (Signed)
Pt presents to ED with complaints of generalized weakness, emesis, no appetite, decreased energy. Was seen at [facility], treated for UTI, given Rocephin injection and Cipro but unable to keep cipro down.

## 2022-09-16 NOTE — ED Provider Notes (Signed)
Care of patient assumed from Dr. Sharlett Iles.  This patient presents for 1.5 weeks of generalized weakness.  He was initially hypoxic but this seems to have resolved.  He did have some recent fevers and was treated for UTI. He is currently undergoing sepsis work-up.  He has received 1 L of fluid and 4 mg of Zofran thus far. Physical Exam  BP 102/72   Pulse 86   Temp 97.7 F (36.5 C) (Oral)   Resp 17   Ht '5\' 11"'$  (1.803 m)   Wt 93.9 kg   SpO2 96%   BMI 28.87 kg/m   Physical Exam Vitals and nursing note reviewed.  Constitutional:      General: He is not in acute distress.    Appearance: Normal appearance. He is well-developed. He is not ill-appearing, toxic-appearing or diaphoretic.  HENT:     Head: Normocephalic and atraumatic.     Right Ear: External ear normal.     Left Ear: External ear normal.     Nose: Nose normal.     Mouth/Throat:     Mouth: Mucous membranes are moist.     Pharynx: Oropharynx is clear.  Eyes:     Extraocular Movements: Extraocular movements intact.     Conjunctiva/sclera: Conjunctivae normal.  Cardiovascular:     Rate and Rhythm: Normal rate and regular rhythm.     Heart sounds: No murmur heard. Pulmonary:     Effort: Pulmonary effort is normal. No respiratory distress.  Abdominal:     General: There is no distension.     Palpations: Abdomen is soft.     Tenderness: There is no abdominal tenderness.  Musculoskeletal:        General: No swelling. Normal range of motion.     Cervical back: Normal range of motion and neck supple.     Right lower leg: No edema.     Left lower leg: No edema.     Comments: Congenital deformities of left hand.  Skin:    General: Skin is warm and dry.     Capillary Refill: Capillary refill takes less than 2 seconds.  Neurological:     General: No focal deficit present.     Mental Status: He is alert and oriented to person, place, and time.     Cranial Nerves: No cranial nerve deficit.     Sensory: No sensory deficit.      Motor: No weakness.     Coordination: Coordination normal.  Psychiatric:        Mood and Affect: Mood normal.        Behavior: Behavior normal.        Thought Content: Thought content normal.        Judgment: Judgment normal.     Procedures  Procedures  ED Course / MDM    Medical Decision Making Amount and/or Complexity of Data Reviewed Labs: ordered. Radiology: ordered.  Risk Prescription drug management.   On assessment, patient resting comfortably.  Supplemental oxygen was removed and patient was able to maintain normal saturations on room air.  His work of breathing is unlabored.  Patient currently reports resolution of nausea.  He was given water and graham crackers to eat in the ED.  He is able to tolerate these without any further nausea.  Urinalysis does show some evidence of continued UTI.  Patient is currently on ciprofloxacin, based on cultures from 5 days ago.  Although he has been taking this as prescribed, he will frequently have nausea and  vomiting following his morning dose.  For his nausea at home, he has Phenergan.  He does not have any other antiemetics.  He did seem to have a good effect from Zofran.  Laboratory work-up is reassuring.  He has no leukocytosis or lactic acidosis.  He does feel improved following IV fluids.  He underwent CT imaging of chest, abdomen, and pelvis.  On CT imaging of pelvis, patient does appear to have some enlarged lymph nodes when compared to prior scans.  This does raise concern for worsening metastatic disease.  This may be contributing to his recent symptoms of fatigue, generalized weakness, nausea.  I discussed these findings with the patient and his wife, who accompanies him at bedside.  They do feel comfortable with discharge home.  They have plans to call oncology tomorrow to set up a close follow-up appointment.  They were advised to continue the ciprofloxacin and to follow-up on results of urine cultures drawn today.  For his nausea,  patient to be prescribed Zofran, to take as needed at home for nausea.  Patient was advised to return to the ED at any time for any new or worsening symptoms.  He was discharged in stable condition.       Godfrey Pick, MD 09/16/22 [phone]

## 2022-09-16 NOTE — Telephone Encounter (Signed)
Patients wife called to report that patient has not improved much since his visit to [facility] here at [facility] last week.  He was tested for UTI and was given Rocephin in the clinic and oral Cipro was prescribed.  He has been unsuccessful in keeping down the oral Cipro and vomits shortly after taking the medication.  He is also unable to keep any food down.  Water consumption is around 3-4 bottles a day.  Severe fatigue.  Urine Culture shows that Rocephin was resistant to strain of bacteria and Cipro would be appropriate however, he isn't able to keep this medication down.    Provider and [facility] clinic closed for today so I encouraged her to take him to the emergency room closest to her as he may need stronger antinausea medication and IV antibiotics that would be appropriate for his type of infection.    Routing to both providers to make aware upon their return.

## 2022-09-16 NOTE — ED Provider Notes (Signed)
[facility] EMERGENCY DEPARTMENT Provider Note   CSN: [id number] Arrival date & time: 09/16/22  1133     History {Add pertinent medical, surgical, social history, OB history to HPI:1} Chief Complaint  Patient presents with   Weakness    Barry Gibson is a 61 y.o. male.  keflex ethen wednesday cipro (resistant) also with n/v 61 y.o. 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 UTI has been vomtiing so concerned he hasn't been getting his cipro  Also with back pain       Home Medications Prior to Admission medications   Medication Sig Start Date End Date Taking? Authorizing Provider  acetaminophen (TYLENOL) 650 MG CR tablet Take 1,300 mg by mouth every morning.    [provider]  amLODipine (NORVASC) 5 MG tablet Take 1 tablet (5 mg total) by mouth daily. 09/02/22   Coral Spikes, DO  ciprofloxacin (CIPRO) 500 MG tablet Take 1 tablet (500 mg total) by mouth 2 (two) times daily for 7 days. 09/11/22 09/18/22  Walisiewicz, Verline Lema E, PA-C  losartan (COZAAR) 25 MG tablet Take 1 tablet (25 mg total) by mouth daily. 09/02/22   Coral Spikes, DO  Multiple Vitamin (MULTIVITAMIN WITH MINERALS) TABS tablet Take 1 tablet by mouth daily. 09/18/21   Debbe Odea, MD  omeprazole (PRILOSEC) 40 MG capsule Take 40 mg by mouth daily.    [provider]  prednisoLONE acetate (PRED FORTE) 1 % ophthalmic suspension Place 1 drop into the left eye 2 (two) times a week. Instill 1 drop into left eye Monday and Friday.    [provider]  rosuvastatin (CRESTOR) 20 MG tablet TAKE 1 TABLET(20 MG) BY MOUTH DAILY 03/18/22   Thersa Salt G, DO  sertraline (ZOLOFT) 50 MG tablet TAKE 1 TABLET(50 MG) BY MOUTH DAILY 08/29/22   Coral Spikes, DO      Allergies    Ace inhibitors and Diflucan [fluconazole]    Review of Systems   Review of Systems  Physical Exam Updated Vital Signs BP (!) 86/57    Pulse 80   Temp (!) 97.5 F (36.4 C) (Oral)   Resp 12   Ht '5\' 11"'$  (1.803 m)   Wt 93.9 kg   SpO2 92%   BMI 28.87 kg/m  Physical Exam  ED Results / Procedures / Treatments   Labs (all labs ordered are listed, but only abnormal results are displayed) Labs Reviewed  CULTURE, BLOOD (SINGLE)  RESP PANEL BY RT-PCR (FLU A&B, COVID) ARPGX2  URINALYSIS, ROUTINE W REFLEX MICROSCOPIC  COMPREHENSIVE METABOLIC PANEL  CBC WITH DIFFERENTIAL/PLATELET  CBG MONITORING, ED  TROPONIN I (HIGH SENSITIVITY)    EKG None  Radiology DG Chest 1 View  Result Date: 09/16/2022 CLINICAL DATA:  SOB, hypoxia EXAM: CHEST  1 VIEW COMPARISON:  Chest x-ray October 25, 23. FINDINGS: The heart size and mediastinal contours are within normal limits. Both lungs are clear. No visible pleural effusions or pneumothorax. No acute osseous abnormality. Right IJ approach Port-A-Cath with the tip projecting at the superior right atrium, similar. IMPRESSION: No active disease. Electronically Signed   By: Margaretha Sheffield M.D.   On: 09/16/2022 14:30    Procedures Procedures  {Document cardiac monitor, telemetry assessment procedure when appropriate:1}  Medications Ordered in ED Medications - No data to display  ED Course/ Medical Decision Making/ A&P  Medical Decision Making Amount and/or Complexity of Data Reviewed Labs: ordered. Radiology: ordered.  Risk Prescription drug management.   ***  {Document critical care time when appropriate:1} {Document review of labs and clinical decision tools ie heart score, Chads2Vasc2 etc:1}  {Document your independent review of radiology images, and any outside records:1} {Document your discussion with family members, caretakers, and with consultants:1} {Document social determinants of health affecting pt's care:1} {Document your decision making why or why not admission, treatments were needed:1} Final Clinical Impression(s) / ED Diagnoses Final  diagnoses:  None    Rx / DC Orders ED Discharge Orders     None

## 2022-09-17 ENCOUNTER — Telehealth: Payer: Self-pay | Admitting: *Deleted

## 2022-09-17 NOTE — Telephone Encounter (Signed)
Patient's wife called. He had CT C/A/P at ED last night [facility]). He went for N/V and dehydration. Now taking Zofran as prescribed for N/V and tolerating Cipro. Ms. Tolen wants to know if CT scheduled on Friday can be cancelled now since he had one yesterday. And if appt with Dr. Alen Blew can be moved to an earlier date from 11/10. Informed her Dr. Alen Blew out of office at this time and message will be sent to him. She verbalized understanding.

## 2022-09-18 LAB — URINE CULTURE: Culture: NO GROWTH

## 2022-09-20 ENCOUNTER — Ambulatory Visit (HOSPITAL_COMMUNITY): Payer: BC Managed Care – PPO

## 2022-09-20 ENCOUNTER — Other Ambulatory Visit: Payer: Self-pay

## 2022-09-20 ENCOUNTER — Encounter (HOSPITAL_COMMUNITY): Payer: Self-pay

## 2022-09-20 ENCOUNTER — Inpatient Hospital Stay: Payer: BC Managed Care – PPO | Attending: Oncology | Admitting: Oncology

## 2022-09-20 VITALS — BP 105/67 | HR 76 | Temp 97.4°F | Resp 18 | Wt 209.1 lb

## 2022-09-20 DIAGNOSIS — C679 Malignant neoplasm of bladder, unspecified: Secondary | ICD-10-CM

## 2022-09-20 DIAGNOSIS — Z79899 Other long term (current) drug therapy: Secondary | ICD-10-CM | POA: Diagnosis not present

## 2022-09-20 DIAGNOSIS — Z883 Allergy status to other anti-infective agents status: Secondary | ICD-10-CM | POA: Insufficient documentation

## 2022-09-20 DIAGNOSIS — R590 Localized enlarged lymph nodes: Secondary | ICD-10-CM | POA: Diagnosis not present

## 2022-09-20 DIAGNOSIS — N390 Urinary tract infection, site not specified: Secondary | ICD-10-CM | POA: Insufficient documentation

## 2022-09-20 DIAGNOSIS — C670 Malignant neoplasm of trigone of bladder: Secondary | ICD-10-CM | POA: Insufficient documentation

## 2022-09-20 DIAGNOSIS — C786 Secondary malignant neoplasm of retroperitoneum and peritoneum: Secondary | ICD-10-CM | POA: Insufficient documentation

## 2022-09-20 DIAGNOSIS — K439 Ventral hernia without obstruction or gangrene: Secondary | ICD-10-CM | POA: Diagnosis not present

## 2022-09-20 DIAGNOSIS — F32A Depression, unspecified: Secondary | ICD-10-CM | POA: Insufficient documentation

## 2022-09-20 DIAGNOSIS — K5730 Diverticulosis of large intestine without perforation or abscess without bleeding: Secondary | ICD-10-CM | POA: Diagnosis not present

## 2022-09-20 DIAGNOSIS — I7143 Infrarenal abdominal aortic aneurysm, without rupture: Secondary | ICD-10-CM | POA: Insufficient documentation

## 2022-09-20 DIAGNOSIS — Z8616 Personal history of COVID-19: Secondary | ICD-10-CM | POA: Diagnosis not present

## 2022-09-20 DIAGNOSIS — Z9079 Acquired absence of other genital organ(s): Secondary | ICD-10-CM | POA: Diagnosis not present

## 2022-09-20 DIAGNOSIS — Z5112 Encounter for antineoplastic immunotherapy: Secondary | ICD-10-CM | POA: Insufficient documentation

## 2022-09-20 NOTE — Progress Notes (Signed)
DISCONTINUE OFF PATHWAY REGIMEN - Bladder   [id number]:Pembrolizumab 200 mg IV D1 q21 Days:   A cycle is every 21 days:     Pembrolizumab   **Always confirm dose/schedule in your pharmacy ordering system**  REASON: Disease Progression PRIOR TREATMENT: Off Pathway: Pembrolizumab 200 mg IV D1 q21 Days TREATMENT RESPONSE: Stable Disease (SD)  START ON PATHWAY REGIMEN - Bladder     A cycle is every 28 days:     Enfortumab vedotin-ejfv   **Always confirm dose/schedule in your pharmacy ordering system**  Patient Characteristics: Advanced/Metastatic Disease, Second Line, FGFR2/FGFR3 Mutation Negative or Unknown, Prior/Ineligible for Platinum-Based Therapy and PD-1/PD-L1 Inhibitor Therapeutic Status: Advanced/Metastatic Disease Line of Therapy: Second Line FGFR2/FGFR3 Mutation Status: Did Not Order Test Intent of Therapy: Non-Curative / Palliative Intent, Discussed with Patient

## 2022-09-20 NOTE — Progress Notes (Signed)
Hematology and Oncology Follow Up Visit  Barry Gibson [id number] 1960-12-02 61 y.o. 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   Prior Therapy:   He underwent robotic assisted laparoscopic radical cystectomy and lymphadenectomy January 2022.  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  He had 4 out of 7 lymph nodes involved with the final pathological staging is T4N2 disease.     He underwent staging scans on April 19, 2021 which showed interval progression of abdominal pelvic adenopathy consistent with worsening nodal metastasis.    Chemotherapy utilizing gemcitabine and cisplatin started on June 08, 2021 receiving day 1 cisplatin and gemcitabine with day 8 gemcitabine alone.  He completed cycle 4 on August 17, 2021.     Current therapy: Pembrolizumab 200 mg every 3 weeks started on September 07, 2021.  He is currently receiving 400 mg every 6 weeks.  Last treatment was given on July 19, 2022.   Interim History: Barry Gibson returns for a follow-up visit.  Since last visit, he was seen in the emergency department on October 30 for failure to thrive and weakness.  CT scan chest abdomen and pelvis completed at that time showed right external iliac lymph node has increased in size now up to 14 mm previously was 12 and an enlarged 15 mm lymph node in the right inguinal lymph node.  Omental metastasis could also be ruled out.  Clinically, he reports feeling well and has recovered from his urinary tract infection.  He reports the Zofran has helped his symptoms at this time.  He has resumed work related duties.    Medications: Reviewed without changes. Current Outpatient Medications  Medication Sig  Dispense Refill   acetaminophen (TYLENOL) 650 MG CR tablet Take 1,300 mg by mouth every morning.     amLODipine (NORVASC) 5 MG tablet Take 1 tablet (5 mg total) by mouth daily. 90 tablet 3   ciprofloxacin (CIPRO) 500 MG tablet Take 1 tablet (500 mg total) by mouth 2 (two) times daily for 5 days. 10 tablet 0   hydroxychloroquine (PLAQUENIL) 200 MG tablet Take 200 mg by mouth 2 (two) times daily.     losartan (COZAAR) 25 MG tablet Take 1 tablet (25 mg total) by mouth daily. 90 tablet 3   Multiple Vitamin (MULTIVITAMIN WITH MINERALS) TABS tablet Take 1 tablet by mouth daily.     omeprazole (PRILOSEC) 40 MG capsule Take 40 mg by mouth daily.     ondansetron (ZOFRAN-ODT) 4 MG disintegrating tablet Take 1 tablet (4 mg total) by mouth every 8 (eight) hours as needed for nausea or vomiting. 20 tablet 0   prednisoLONE acetate (PRED FORTE) 1 % ophthalmic suspension Place 1 drop into the left eye 2 (two) times a week. Instill 1 drop into left eye Monday and Friday.     rosuvastatin (CRESTOR) 20 MG tablet TAKE 1 TABLET(20 MG) BY MOUTH DAILY (Patient taking differently: Take 20 mg by mouth daily.) 90 tablet 1   sertraline (ZOLOFT) 50 MG tablet TAKE 1 TABLET(50 MG) BY MOUTH DAILY 90 tablet 3   No current facility-administered medications for this visit.   Facility-Administered Medications Ordered in Other Visits  Medication Dose Route Frequency Provider Last Rate Last Admin   acetaminophen (TYLENOL) 325 MG tablet  diphenhydrAMINE (BENADRYL) 25 mg capsule            gemcitabine (GEMZAR) chemo syringe for bladder instillation 2,000 mg  2,000 mg Bladder Instillation Once Franchot Gallo, MD       gemcitabine [facility]) chemo syringe for bladder instillation 2,000 mg  2,000 mg Bladder Instillation Once Franchot Gallo, MD         Allergies:  Allergies  Allergen Reactions   Ace Inhibitors Cough   Diflucan [Fluconazole] Other (See Comments)    Irritated ulcers      Physical  Exam:    Blood pressure 105/67, pulse 76, temperature (!) 97.4 F (36.3 C), temperature source Oral, resp. rate 18, weight 209 lb 2 oz (94.9 kg), SpO2 99 %.           ECOG: 0     General appearance: Comfortable appearing without any discomfort Head: Normocephalic without any trauma Oropharynx: Mucous membranes are moist and pink without any thrush or ulcers. Eyes: Pupils are equal and round reactive to light. Lymph nodes: No cervical, supraclavicular, inguinal or axillary lymphadenopathy.   Heart:regular rate and rhythm.  S1 and S2 without leg edema. Lung: Clear without any rhonchi or wheezes.  No dullness to percussion. Abdomin: Soft, nontender, nondistended with good bowel sounds.  No hepatosplenomegaly. Musculoskeletal: No joint deformity or effusion.  Full range of motion noted. Neurological: No deficits noted on motor, sensory and deep tendon reflex exam. Skin: No petechial rash or dryness.  Appeared moist.  .              Lab Results: Lab Results  Component Value Date   WBC 5.6 09/16/2022   HGB 14.6 09/16/2022   HCT 42.0 09/16/2022   MCV 85.9 09/16/2022   PLT 204 09/16/2022   PSA 0.53 11/25/2014     Chemistry      Component Value Date/Time   NA 134 (L) 09/16/2022 1412   NA 141 11/02/2021 1512   K 3.8 09/16/2022 1412   CL 103 09/16/2022 1412   CO2 23 09/16/2022 1412   BUN 18 09/16/2022 1412   BUN 23 11/02/2021 1512   CREATININE 1.40 (H) 09/16/2022 1412   CREATININE 1.48 (H) 09/11/2022 1134   CREATININE 1.05 11/25/2014 0826      Component Value Date/Time   CALCIUM 9.1 09/16/2022 1412   ALKPHOS 158 (H) 09/16/2022 1412   AST 28 09/16/2022 1412   AST 19 09/11/2022 1134   ALT 16 09/16/2022 1412   ALT 12 09/11/2022 1134   BILITOT 0.7 09/16/2022 1412   BILITOT 1.1 09/11/2022 1134      CT ABDOMEN PELVIS W CONTRAST (Accession [id number]) (Order [id number]) Imaging Date: 09/16/2022 Department: [facility] EMERGENCY DEPARTMENT Released  By/Authorizing: Fransico Meadow, MD (auto-released)   Exam Status  Status  Final [99]   PACS Intelerad Image Link   Show images for CT ABDOMEN PELVIS W CONTRAST Study Result  Narrative & Impression  CLINICAL DATA:  Nausea and vomiting. Left lower back pain.   EXAM: CT ABDOMEN AND PELVIS WITH CONTRAST   TECHNIQUE: Multidetector CT imaging of the abdomen and pelvis was performed using the standard protocol following bolus administration of intravenous contrast.   RADIATION DOSE REDUCTION: This exam was performed according to the departmental dose-optimization program which includes automated exposure control, adjustment of the mA and/or kV according to patient size and/or use of iterative reconstruction technique.   CONTRAST:  13m OMNIPAQUE IOHEXOL 350 MG/ML SOLN   COMPARISON:  Abdomen pelvis CT 06/17/2022. concurrent chest  CTA, reported separately.   FINDINGS: Lower chest: Assessed on concurrent chest CTA, reported separately.   Hepatobiliary: No focal hepatic abnormalities. Small gallstone. Gallbladder is distended but no wall thickening or pericholecystic fat stranding. No biliary dilatation.   Pancreas: No ductal dilatation or inflammation.   Spleen: Normal in size without focal abnormality.   Adrenals/Urinary Tract: No adrenal nodule. Cortical scarring in the upper left kidney. There is early excretion of IV contrast from both renal collecting systems. No hydronephrosis. No ureteral dilatation. Small bilateral renal cysts. No specific imaging follow-up is recommended. No solid renal lesion. Cystectomy with ileal conduit. No soft tissue density in the cystectomy bed.   Stomach/Bowel: The stomach is decompressed. There is no small bowel obstruction or inflammation. Midline ventral abdominal hernia in the region of the umbilicus contains a short segment of small bowel, but no associated inflammation. The appendix is normal. Descending and sigmoid colonic  diverticulosis. No diverticulitis. No acute colonic inflammation.   Vascular/Lymphatic: Right external iliac node measures 14 mm, series 3, image 75 previously 12 mm. There is a new enlarged 15 mm lymph node in the high right inguinal region series 3, image 81, previously 7 mm. The previous left lower quadrant peritoneal nodule is not convincingly seen on the current exam. There are multiple small lymph nodes in the retroperitoneum that are not enlarged by size criteria. 12 mm periportal node series 3, image 27, increased, nonspecific. Infrarenal aortic aneurysm maximal dimension 4 cm, measured on series 3, image 47, unchanged. There is circumferential mural thrombus. No evidence of rupture. Patent portal and splenic veins.   Reproductive: Prostatectomy.   Other: Right-sided urostomy. There is a ventral abdominal wall hernia at the level of the ostomy that contains a short segment of small bowel, no associated inflammation. Fat containing supraumbilical ventral abdominal wall hernia without bowel involvement. Faint ill-defined soft tissue density in the right upper quadrant series 3, images 29 and 33, not definitively seen on prior. No ascites. No free air.   Musculoskeletal: No focal bone lesion or acute osseous findings. No evident intramuscular lesions.   IMPRESSION: 1. Post cystoprostatectomy with ileal conduit. 2. Right external iliac node has increased in size from prior exam, now 14 mm, previously 12 mm. There is a new enlarged 15 mm lymph node in the high right inguinal region, previously 7 mm. Findings are suspicious for nodal metastasis. 3. Faint ill-defined soft tissue density in the right upper quadrant of the abdomen, not definitively seen on prior exam. This may represent omental metastatic involvement. 4. Unchanged infrarenal aortic aneurysm measuring 4 cm. Recommend follow-up every 12 months and vascular consultation. This recommendation follows ACR consensus  guidelines: White Paper of the ACR Incidental Findings Committee II on Vascular Findings. J Am Coll Radiol 2013; 10:789-794. 5. Cholelithiasis without gallbladder inflammation. 6. Colonic diverticulosis without diverticulitis. 7. Ventral abdominal wall. Hernia at the level of the ostomy contains a short segment of small bowel, but no associated inflammation. Supraumbilical ventral abdominal wall hernia contains only fat. 8. The previous left lower quadrant peritoneal nodule is not convincingly seen on the current exam      Impression and Plan:  61 year old man with:  1.  Bladder cancer diagnosed in January 2022.  He has stage IV high-grade urothelial carcinoma with pelvic adenopathy.   CT scan obtained on September 16, 2022 was personally reviewed and discussed with the patient today.  He does have evidence of progression of disease and I have recommended discontinuation of Pembrolizumab.  Salvage therapy options  including FGFR inhibitor versus antibody drug conjugate such as Padcev were discussed.  Complication associated with Padcev were discussed.  These include nausea, fatigue, hyperglycemia and neuropathy.  After discussion today he is agreeable to proceed and will start to with treatment in the near future.    2.  Urinary tract infection: Related to his urostomy and GU tract manipulation.  He has completed antibiotic course with improvement in his symptoms.  3.  Nausea and vomiting and failure to thrive: Related to urinary tract infection has resolved.       4.  Follow-up: He will return on November 10 for the start of therapy.   30  minutes were spent on this encounter.  Time was dedicated to reviewing his disease status, reviewing choices and outlining plan of care discussion.     Zola Button, MD 11/3/20231:23 PM

## 2022-09-21 LAB — CULTURE, BLOOD (ROUTINE X 2)
Culture: NO GROWTH
Culture: NO GROWTH
Special Requests: ADEQUATE

## 2022-09-23 ENCOUNTER — Inpatient Hospital Stay (HOSPITAL_BASED_OUTPATIENT_CLINIC_OR_DEPARTMENT_OTHER): Payer: BC Managed Care – PPO | Admitting: Physician Assistant

## 2022-09-23 ENCOUNTER — Telehealth: Payer: Self-pay

## 2022-09-23 ENCOUNTER — Inpatient Hospital Stay: Payer: BC Managed Care – PPO

## 2022-09-23 ENCOUNTER — Other Ambulatory Visit: Payer: Self-pay

## 2022-09-23 VITALS — BP 100/70 | HR 70 | Temp 98.1°F | Resp 16 | Wt 206.0 lb

## 2022-09-23 VITALS — BP 100/61 | HR 77 | Resp 16

## 2022-09-23 DIAGNOSIS — Z95828 Presence of other vascular implants and grafts: Secondary | ICD-10-CM

## 2022-09-23 DIAGNOSIS — C670 Malignant neoplasm of trigone of bladder: Secondary | ICD-10-CM | POA: Diagnosis not present

## 2022-09-23 DIAGNOSIS — M545 Low back pain, unspecified: Secondary | ICD-10-CM

## 2022-09-23 DIAGNOSIS — C679 Malignant neoplasm of bladder, unspecified: Secondary | ICD-10-CM | POA: Diagnosis not present

## 2022-09-23 DIAGNOSIS — R5383 Other fatigue: Secondary | ICD-10-CM | POA: Diagnosis not present

## 2022-09-23 DIAGNOSIS — N390 Urinary tract infection, site not specified: Secondary | ICD-10-CM

## 2022-09-23 LAB — CBC WITH DIFFERENTIAL (CANCER CENTER ONLY)
Abs Immature Granulocytes: 0.09 10*3/uL — ABNORMAL HIGH (ref 0.00–0.07)
Basophils Absolute: 0.1 10*3/uL (ref 0.0–0.1)
Basophils Relative: 2 %
Eosinophils Absolute: 0.4 10*3/uL (ref 0.0–0.5)
Eosinophils Relative: 6 %
HCT: 40.1 % (ref 39.0–52.0)
Hemoglobin: 13.6 g/dL (ref 13.0–17.0)
Immature Granulocytes: 2 %
Lymphocytes Relative: 23 %
Lymphs Abs: 1.4 10*3/uL (ref 0.7–4.0)
MCH: 28.9 pg (ref 26.0–34.0)
MCHC: 33.9 g/dL (ref 30.0–36.0)
MCV: 85.1 fL (ref 80.0–100.0)
Monocytes Absolute: 0.4 10*3/uL (ref 0.1–1.0)
Monocytes Relative: 7 %
Neutro Abs: 3.8 10*3/uL (ref 1.7–7.7)
Neutrophils Relative %: 60 %
Platelet Count: 312 10*3/uL (ref 150–400)
RBC: 4.71 MIL/uL (ref 4.22–5.81)
RDW: 14.3 % (ref 11.5–15.5)
WBC Count: 6.2 10*3/uL (ref 4.0–10.5)
nRBC: 0 % (ref 0.0–0.2)

## 2022-09-23 LAB — CMP (CANCER CENTER ONLY)
ALT: 9 U/L (ref 0–44)
AST: 29 U/L (ref 15–41)
Albumin: 3.5 g/dL (ref 3.5–5.0)
Alkaline Phosphatase: 451 U/L — ABNORMAL HIGH (ref 38–126)
Anion gap: 8 (ref 5–15)
BUN: 17 mg/dL (ref 6–20)
CO2: 25 mmol/L (ref 22–32)
Calcium: 8.9 mg/dL (ref 8.9–10.3)
Chloride: 101 mmol/L (ref 98–111)
Creatinine: 1.42 mg/dL — ABNORMAL HIGH (ref 0.61–1.24)
GFR, Estimated: 57 mL/min — ABNORMAL LOW (ref >60)
Glucose, Bld: 109 mg/dL — ABNORMAL HIGH (ref 70–99)
Potassium: 3.6 mmol/L (ref 3.5–5.1)
Sodium: 134 mmol/L — ABNORMAL LOW (ref 135–145)
Total Bilirubin: 0.4 mg/dL (ref 0.3–1.2)
Total Protein: 6.6 g/dL (ref 6.5–8.1)

## 2022-09-23 LAB — URINALYSIS, COMPLETE (UACMP) WITH MICROSCOPIC
Bilirubin Urine: NEGATIVE
Glucose, UA: NEGATIVE mg/dL
Hgb urine dipstick: NEGATIVE
Ketones, ur: NEGATIVE mg/dL
Nitrite: NEGATIVE
Protein, ur: NEGATIVE mg/dL
Specific Gravity, Urine: 1.017 (ref 1.005–1.030)
WBC, UA: >50 WBC/hpf — ABNORMAL HIGH (ref 0–5)
pH: 5 (ref 5.0–8.0)

## 2022-09-23 MED ORDER — SODIUM CHLORIDE 0.9 % IV SOLN: Freq: Once | INTRAVENOUS | Status: AC

## 2022-09-23 MED ORDER — HEPARIN SOD (PORK) LOCK FLUSH 100 UNIT/ML IV SOLN: 500.0000 [IU] | Freq: Once | INTRAVENOUS | Status: DC

## 2022-09-23 MED ORDER — NITROFURANTOIN MONOHYD MACRO 100 MG PO CAPS: 100.0000 mg | ORAL_CAPSULE | Freq: Two times a day (BID) | ORAL | 0 refills | Status: AC

## 2022-09-23 MED ORDER — SODIUM CHLORIDE 0.9% FLUSH
10.0000 mL | Freq: Once | INTRAVENOUS | Status: AC
  Administered 2022-09-23: 10 mL

## 2022-09-23 MED ORDER — ALTEPLASE 2 MG IJ SOLR: 2.0000 mg | Freq: Once | INTRAMUSCULAR | Status: DC

## 2022-09-23 MED ORDER — HEPARIN SOD (PORK) LOCK FLUSH 100 UNIT/ML IV SOLN
500.0000 [IU] | Freq: Once | INTRAVENOUS | Status: AC
  Administered 2022-09-23: 500 [IU]

## 2022-09-23 NOTE — Telephone Encounter (Signed)
Pt has an appt today at 12:30 for labs and [facility] at 1:00

## 2022-09-23 NOTE — Telephone Encounter (Signed)
T/C from pt's wife stating pt had a really rough weekend and all of symptoms he was having last week have returned.  He finished his antibiotics Sunday but has still had a low grade fever, fatigue and just not able to work today.    She would like for him to be seen today.  Please advise

## 2022-09-23 NOTE — Progress Notes (Signed)
Symptom Management Consult note [facility]    Patient Care Team: Coral Spikes, DO as PCP - General (Family Medicine)    Name of the patient: Barry Gibson  [id number]  17-Nov-1961   Date of visit: 09/23/2022   Chief Complaint/Reason for visit: fatigue   Current Therapy: recently swtiched from [place] to [place]. Due to start Padcev 09/27/22. Last Keytruda was 07/19/22   ASSESSMENT & PLAN: Patient is a 61 y.o. 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 Alen Blew.  I have viewed most recent oncology note and lab work.    #) Stage IV (T4N2) high-grade urothelial carcinoma with pelvic and abdominal adenopathy  -Chart review shows patient saw oncologist 09/20/22 and discussed recent ED visit and CT scan showing concern for worsening adenopathy with inability to rule out omental metastasis. Plan was to discontinue Keytruda and start Padcev, - Next appointment with oncologist is 10/25/22  #) UTI -Recently treated with Cipro which he finished yesterday. -UA today shows small leukocytes, over 50 WBC with rare bacteria.  As patient is having low-grade fever will send urine culture and treat for UTI at this time. Prescription sent to pharmacy for [place].  #)Fatigue -Patient with ongoing fatigue since recent UTI. -Patient is well appearing, HDS.  -CBC unremarkable, no leukocytosis, no anemia, normal platelets. -CMP showing without significant electrolyte derangement. Kidney function consistent with recent.  -Patient taking zoloft for depression. Depression could be contributing to his fatigue as well. Message sent to PCP at patient's request to ask about dose increase. Patient feels safe at home, not suicidal or homicidal.  #)Poor PO intake -He has had poor PO intake over last week, 1 L NS given for hydration here in clinic. CMP without signs of dehydration.   #)Back  pain -Ongoing myalgia after trying to catch 95 gallon trash bin from falling out of his truck. -No red flags for cord compression. No CVA tenderness or symptoms to suggest renal colic. -Possible back pain is coming from lumbar strain vs worsening lymphadenopathy. Discussed OTC symptom management to try for lumbar strain.    Strict ED precautions discussed should symptoms worsen.    Heme/Onc History: Oncology History  Bladder cancer ([place])  03/06/2020 Initial Diagnosis   Bladder cancer ([facility])   05/18/2021 Cancer Staging   Staging form: Urinary Bladder, AJCC 8th Edition - Clinical: Stage IVB (cT4, cN2, cM1b) - Signed by Wyatt Portela, MD on 05/18/2021 WHO/ISUP grade (low/high): High Grade Histologic grading system: 2 grade system   06/08/2021 - 08/17/2021 Chemotherapy   Patient is on Treatment Plan : BLADDER Cisplatin D1 + Gemcitabine D1,8 q21d x 6 Cycles     09/07/2021 - 06/07/2022 Chemotherapy   Patient is on Treatment Plan : HEAD/NECK Pembrolizumab      09/07/2021 - 07/19/2022 Chemotherapy   Patient is on Treatment Plan : ANUS Pembrolizumab (400) q42d     09/27/2022 -  Chemotherapy   Patient is on Treatment Plan : UROTHELIAL LOCALLY ADVANCED, METASTATIC Enfortumab D1,8,15 q28d         Interval history-: Barry Gibson is a 61 y.o. male with oncologic history as above presenting to [facility] today with chief complaint of fatigue x2 weeks.  Patient is accompanied by his spouse who provides additional history.  Patient states that he has had low-grade temperature over the last several days.  Tmax was 100 last night.  Has been taking Tylenol  for this, has not had any yet today.  Spouse reports they are concerned about how tired he is.  He is spending a lot of time on the couch and has little energy to do things.  He was able to go to work 2 days last week and thought he had finally recovered from his recent UTI.  He finished his Cipro yesterday for a total of 10 days.  He has had intermittent  nausea at home which has been relieved with Zofran.  His appetite has been up and down.  For the last several days he has had limited p.o. intake. Patient reports that he has had ongoing back pain x6 weeks.  The pain started after he was lifting a 95 gallon trash can into his truck that started to fall and he reached to catch it.  Later that day the pain started.  He describes the pain is located in his lower back.  It does not radiate.  The pain is intermittent.  He describes as a soreness.  He rates the pain 4 out of 10 in severity.  He did notice that pain improves when taking Tylenol.  He denies any constipation or changes in urinary output.  He denies any weakness or numbness in his legs.  Spouse is also asking if his depression could be contributing to his fatigue.  Patient states he is taking Zoloft for depression which is prescribed by his PCP.  He is compliant with this medication.  He has been on the same dose for a while now.  He denies any suicidal or homicidal ideations and feels safe at home.  Patient does admit to feeling down lately.    ROS  All other systems are reviewed and are negative for acute change except as noted in the HPI.    Allergies  Allergen Reactions   Ace Inhibitors Cough   Diflucan [Fluconazole] Other (See Comments)    Irritated ulcers     Past Medical History:  Diagnosis Date   Cancer ([place])    bladder   Cataract    COVID-19 11/13/2019   h/a, chills, fatigue, loss of taste and smell, all symptoms resolved in a few weeks   Frequent urination    Fuchs' corneal dystrophy    Full dentures    GERD (gastroesophageal reflux disease)    Hypertension    Rheumatoid arthritis ([place])      Past Surgical History:  Procedure Laterality Date   CORNEAL TRANSPLANT Left 2013   CYSTOSCOPY WITH BIOPSY N/A 09/14/2019   Procedure: CYSTOSCOPY WITH BIOPSY;  Surgeon: Franchot Gallo, MD;  Location: [facility];  Service: Urology;  Laterality: N/A;   CYSTOSCOPY WITH  INJECTION N/A 11/29/2020   Procedure: CYSTOSCOPY WITH INJECTION OF INDOCYANINE GREEN DYE;  Surgeon: Alexis Frock, MD;  Location: [facility];  Service: Urology;  Laterality: N/A;  6 HRS   FULGURATION OF BLADDER TUMOR N/A 09/14/2019   Procedure: FULGURATION OF BLADDER TUMOR;  Surgeon: Franchot Gallo, MD;  Location: [facility];  Service: Urology;  Laterality: N/A;   IR IMAGING GUIDED PORT INSERTION  06/07/2021   IR NEPHROSTOMY EXCHANGE LEFT  10/30/2020   IR NEPHROSTOMY EXCHANGE LEFT  11/19/2020   IR NEPHROSTOMY EXCHANGE RIGHT  10/30/2020   IR NEPHROSTOMY EXCHANGE RIGHT  11/19/2020   IR NEPHROSTOMY PLACEMENT LEFT  09/27/2020   IR NEPHROSTOMY PLACEMENT RIGHT  09/27/2020   ROBOT ASSISTED LAPAROSCOPIC COMPLETE CYSTECT ILEAL CONDUIT N/A 11/29/2020   Procedure: XI ROBOTIC ASSISTED LAPAROSCOPIC COMPLETE CYSTECT ILEAL CONDUIT/RADICAL PROSTATECTOMY/LYMPHADENECTOMY;  Surgeon: Alexis Frock, MD;  Location: [facility];  Service: Urology;  Laterality: N/A;   TRANSURETHRAL RESECTION OF BLADDER TUMOR WITH MITOMYCIN-C N/A 08/05/2019   Procedure: TRANSURETHRAL RESECTION OF BLADDER TUMOR;  Surgeon: Franchot Gallo, MD;  Location: [facility];  Service: Urology;  Laterality: N/A;  1 HR   TRANSURETHRAL RESECTION OF BLADDER TUMOR WITH MITOMYCIN-C N/A 04/24/2020   Procedure: TRANSURETHRAL RESECTION OF BLADDER TUMOR WITH GEMCIDABINE;  Surgeon: Franchot Gallo, MD;  Location: [facility];  Service: Urology;  Laterality: N/A;    Social History   Socioeconomic History   Marital status: Married    Spouse name: Not on file   Number of children: Not on file   Years of education: Not on file   Highest education level: Not on file  Occupational History   Not on file  Tobacco Use   Smoking status: Former    Packs/day: 0.50    Years: 20.00    Total pack years: 10.00    Types: Cigarettes   Smokeless tobacco: Never   Tobacco comments:    quit 2010  Vaping Use   Vaping Use: Never used   Substance and Sexual Activity   Alcohol use: Yes    Comment: occ  2 x month   Drug use: No   Sexual activity: Yes  Other Topics Concern   Not on file  Social History Narrative   Not on file   Social Determinants of Health   Financial Resource Strain: Not on file  Food Insecurity: Not on file  Transportation Needs: Not on file  Physical Activity: Not on file  Stress: Not on file  Social Connections: Not on file  Intimate Partner Violence: Not on file    No family history on file.   Current Outpatient Medications:    nitrofurantoin, macrocrystal-monohydrate, (MACROBID) 100 MG capsule, Take 1 capsule (100 mg total) by mouth 2 (two) times daily for 5 days., Disp: 10 capsule, Rfl: 0   acetaminophen (TYLENOL) 650 MG CR tablet, Take 1,300 mg by mouth every morning., Disp: , Rfl:    amLODipine (NORVASC) 5 MG tablet, Take 1 tablet (5 mg total) by mouth daily., Disp: 90 tablet, Rfl: 3   hydroxychloroquine (PLAQUENIL) 200 MG tablet, Take 200 mg by mouth 2 (two) times daily., Disp: , Rfl:    losartan (COZAAR) 25 MG tablet, Take 1 tablet (25 mg total) by mouth daily., Disp: 90 tablet, Rfl: 3   Multiple Vitamin (MULTIVITAMIN WITH MINERALS) TABS tablet, Take 1 tablet by mouth daily., Disp: , Rfl:    omeprazole (PRILOSEC) 40 MG capsule, Take 40 mg by mouth daily., Disp: , Rfl:    ondansetron (ZOFRAN-ODT) 4 MG disintegrating tablet, Take 1 tablet (4 mg total) by mouth every 8 (eight) hours as needed for nausea or vomiting., Disp: 20 tablet, Rfl: 0   prednisoLONE acetate (PRED FORTE) 1 % ophthalmic suspension, Place 1 drop into the left eye 2 (two) times a week. Instill 1 drop into left eye Monday and Friday., Disp: , Rfl:    rosuvastatin (CRESTOR) 20 MG tablet, TAKE 1 TABLET(20 MG) BY MOUTH DAILY (Patient taking differently: Take 20 mg by mouth daily.), Disp: 90 tablet, Rfl: 1   sertraline (ZOLOFT) 50 MG tablet, TAKE 1 TABLET(50 MG) BY MOUTH DAILY, Disp: 90 tablet, Rfl: 3 No current  facility-administered medications for this visit.  Facility-Administered Medications Ordered in Other Visits:    acetaminophen (TYLENOL) 325 MG tablet, , , ,    diphenhydrAMINE (BENADRYL) 25  mg capsule, , , ,    gemcitabine (GEMZAR) chemo syringe for bladder instillation 2,000 mg, 2,000 mg, Bladder Instillation, Once, Franchot Gallo, MD   gemcitabine [facility]) chemo syringe for bladder instillation 2,000 mg, 2,000 mg, Bladder Instillation, Once, Dahlstedt, Stephen, MD  PHYSICAL EXAM: ECOG FS:1 - Symptomatic but completely ambulatory    Vitals:   09/23/22 1256 09/23/22 1259  BP:  100/70  Pulse:  70  Resp:  16  Temp:  98.1 F (36.7 C)  TempSrc:  Oral  SpO2:  99%  Weight: 206 lb (93.4 kg)    Physical Exam Vitals and nursing note reviewed.  Constitutional:      Appearance: He is well-developed. He is not ill-appearing or toxic-appearing.  HENT:     Head: Normocephalic.     Nose: Nose normal.  Eyes:     Conjunctiva/sclera: Conjunctivae normal.  Neck:     Vascular: No JVD.  Cardiovascular:     Rate and Rhythm: Normal rate and regular rhythm.     Pulses: Normal pulses.     Heart sounds: Normal heart sounds.  Pulmonary:     Effort: Pulmonary effort is normal.     Breath sounds: Normal breath sounds.  Chest:     Comments: Port in right upper chest without surrounding infection Abdominal:     General: There is no distension.     Tenderness: There is no right CVA tenderness or left CVA tenderness.     Comments: Urostomy bag with light-colored yellow urine  Musculoskeletal:     Cervical back: Normal range of motion.     Comments: Full range of motion of the T-spine and L-spine No tenderness to palpation of the spinous processes of the T-spine or L-spine No crepitus, deformity or step-offs No tenderness to palpation of the paraspinous muscles of the L-spine  No overlying skin changes or rash.    Skin:    General: Skin is warm and dry.  Neurological:     Mental Status:  He is oriented to person, place, and time.     Comments: Sensation grossly intact to light touch in the lower extremities bilaterally. No saddle anesthesias. Strength 5/5 with flexion and extension at the bilateral hips, knees, and ankles. No noted gait deficit. Coordination intact with heel to shin testing.  Psychiatric:        Thought Content: Thought content does not include suicidal ideation.        LABORATORY DATA: I have reviewed the data as listed    Latest Ref Rng & Units 09/23/2022   12:45 PM 09/16/2022    2:12 PM 09/11/2022   11:34 AM  CBC  WBC 4.0 - 10.5 K/uL 6.2  5.6  11.3   Hemoglobin 13.0 - 17.0 g/dL 13.6  14.6  14.7   Hematocrit 39.0 - 52.0 % 40.1  42.0  44.0   Platelets 150 - 400 K/uL 312  204  185         Latest Ref Rng & Units 09/23/2022   12:45 PM 09/16/2022    2:12 PM 09/11/2022   11:34 AM  CMP  Glucose 70 - 99 mg/dL 109  86  66   BUN 6 - 20 mg/dL '17  18  21   '$ Creatinine 0.61 - 1.24 mg/dL 1.42  1.40  1.48   Sodium 135 - 145 mmol/L 134  134  134   Potassium 3.5 - 5.1 mmol/L 3.6  3.8  3.8   Chloride 98 - 111 mmol/L 101  103  100   CO2 22 - 32 mmol/L '25  23  24   '$ Calcium 8.9 - 10.3 mg/dL 8.9  9.1  9.2   Total Protein 6.5 - 8.1 g/dL 6.6  6.7  6.9   Total Bilirubin 0.3 - 1.2 mg/dL 0.4  0.7  1.1   Alkaline Phos 38 - 126 U/L 451  158  126   AST 15 - 41 U/L '29  28  19   '$ ALT 0 - 44 U/L '9  16  12        '$ RADIOGRAPHIC STUDIES (from last 24 hours if applicable) I have personally reviewed the radiological images as listed and agreed with the findings in the report. No results found.      Visit Diagnosis: 1. Malignant neoplasm of urinary bladder, unspecified site ([place])   2. Port-A-Cath in place   3. Other fatigue   4. Acute bilateral low back pain without sciatica      Orders Placed This Encounter  Procedures   Urine Culture    Standing Status:   Standing    Number of Occurrences:   1    Standing Expiration Date:   09/24/2023   Urinalysis,  Complete w Microscopic    Standing Status:   Standing    Number of Occurrences:   1    Standing Expiration Date:   09/24/2023    All questions were answered. The patient knows to call the clinic with any problems, questions or concerns. No barriers to learning was detected.  I have spent a total of 30 minutes minutes of face-to-face and non-face-to-face time, preparing to see the patient, obtaining and/or reviewing separately obtained history, performing a medically appropriate examination, counseling and educating the patient, ordering tests, documenting clinical information in the electronic health record, and care coordination (communications with other health care professionals or caregivers).    Thank you for allowing me to participate in the care of this patient.    Barrie Folk, PA-C Department of Hematology/Oncology [facility] at [facility] Phone: [phone]  [phone]    09/23/2022 3:17 PM

## 2022-09-24 ENCOUNTER — Other Ambulatory Visit: Payer: Self-pay | Admitting: Family Medicine

## 2022-09-24 ENCOUNTER — Telehealth: Payer: Self-pay | Admitting: *Deleted

## 2022-09-24 ENCOUNTER — Telehealth: Payer: Self-pay | Admitting: Family Medicine

## 2022-09-24 DIAGNOSIS — F32A Depression, unspecified: Secondary | ICD-10-CM

## 2022-09-24 DIAGNOSIS — F419 Anxiety disorder, unspecified: Secondary | ICD-10-CM

## 2022-09-24 LAB — URINE CULTURE: Culture: NO GROWTH

## 2022-09-24 MED ORDER — SERTRALINE HCL 100 MG PO TABS: 100.0000 mg | ORAL_TABLET | Freq: Every day | ORAL | 3 refills | Status: AC

## 2022-09-24 NOTE — Telephone Encounter (Signed)
Pt wife contacted and verbalized understanding.  

## 2022-09-24 NOTE — Telephone Encounter (Signed)
-----   Message from Barry Portela, MD sent at 09/24/2022  7:08 AM EST ----- Regarding: RE: ?radiation Please let the patient know that chemotherapy is the best option. Radiation is not preferable. Thanks ----- Message ----- From: Barry Gibson Sent: 09/23/2022   3:32 PM EST To: Barry Course, RN; Barry Portela, MD Subject: ?radiation                                     Saw patient in [facility] and treating for UTI, will follow up on the culture.   He and spouse asked me to relay a message to you-they are wondering if radiation therapy is appropriate now that his scans showed the lymph nodes had grown or if chemotherapy is the better treatment option right now.  They apologized for not remembering it during the visit and asked to be notified regardless of the answer so I included Barry Gibson on this message as well.  Thanks

## 2022-09-24 NOTE — Telephone Encounter (Signed)
Patient seen cancer doctor yesterday and she suggested to him that he requesting from primary care doctor to increase his Zoloft due to he is starting chemo on 09/27/22 .Wife states cancer doctor sent you a message for you to review on 09/23/22 Please advise

## 2022-09-24 NOTE — Telephone Encounter (Signed)
PC to patient, spoke with his wife Barnett Applebaum, informed her of Dr Hazeline Junker recommendation, she verbalizes understanding.

## 2022-09-25 ENCOUNTER — Encounter: Payer: Self-pay | Admitting: Oncology

## 2022-09-25 ENCOUNTER — Other Ambulatory Visit: Payer: Self-pay | Admitting: Oncology

## 2022-09-25 DIAGNOSIS — N390 Urinary tract infection, site not specified: Secondary | ICD-10-CM

## 2022-09-25 NOTE — Telephone Encounter (Signed)
This RN called responding to patient's wife Barnett Applebaum) voicemail for [facility]. RN communicated per Sherol Dade, PA, that patient's urine culture was negative but to still continue taking antibiotic as prescribed for preventative measure. [name] verbalized understanding and reported that patient is feeling better since his visit in [facility].

## 2022-09-27 ENCOUNTER — Inpatient Hospital Stay: Payer: BC Managed Care – PPO

## 2022-09-27 ENCOUNTER — Ambulatory Visit: Payer: BC Managed Care – PPO | Admitting: Oncology

## 2022-09-27 ENCOUNTER — Other Ambulatory Visit: Payer: Self-pay

## 2022-09-27 VITALS — BP 130/80 | HR 66 | Temp 97.7°F | Resp 17 | Wt 206.5 lb

## 2022-09-27 DIAGNOSIS — Z95828 Presence of other vascular implants and grafts: Secondary | ICD-10-CM

## 2022-09-27 DIAGNOSIS — N390 Urinary tract infection, site not specified: Secondary | ICD-10-CM

## 2022-09-27 DIAGNOSIS — C679 Malignant neoplasm of bladder, unspecified: Secondary | ICD-10-CM

## 2022-09-27 DIAGNOSIS — C670 Malignant neoplasm of trigone of bladder: Secondary | ICD-10-CM | POA: Diagnosis not present

## 2022-09-27 LAB — CBC WITH DIFFERENTIAL (CANCER CENTER ONLY)
Abs Immature Granulocytes: 0.19 10*3/uL — ABNORMAL HIGH (ref 0.00–0.07)
Basophils Absolute: 0.1 10*3/uL (ref 0.0–0.1)
Basophils Relative: 1 %
Eosinophils Absolute: 0 10*3/uL (ref 0.0–0.5)
Eosinophils Relative: 0 %
HCT: 39.3 % (ref 39.0–52.0)
Hemoglobin: 13.4 g/dL (ref 13.0–17.0)
Immature Granulocytes: 2 %
Lymphocytes Relative: 14 %
Lymphs Abs: 1.2 10*3/uL (ref 0.7–4.0)
MCH: 29.3 pg (ref 26.0–34.0)
MCHC: 34.1 g/dL (ref 30.0–36.0)
MCV: 86 fL (ref 80.0–100.0)
Monocytes Absolute: 0.5 10*3/uL (ref 0.1–1.0)
Monocytes Relative: 6 %
Neutro Abs: 6.6 10*3/uL (ref 1.7–7.7)
Neutrophils Relative %: 77 %
Platelet Count: 269 10*3/uL (ref 150–400)
RBC: 4.57 MIL/uL (ref 4.22–5.81)
RDW: 14.6 % (ref 11.5–15.5)
WBC Count: 8.6 10*3/uL (ref 4.0–10.5)
nRBC: 0 % (ref 0.0–0.2)

## 2022-09-27 LAB — CMP (CANCER CENTER ONLY)
ALT: 9 U/L (ref 0–44)
AST: 25 U/L (ref 15–41)
Albumin: 3.8 g/dL (ref 3.5–5.0)
Alkaline Phosphatase: 474 U/L — ABNORMAL HIGH (ref 38–126)
Anion gap: 5 (ref 5–15)
BUN: 23 mg/dL — ABNORMAL HIGH (ref 6–20)
CO2: 26 mmol/L (ref 22–32)
Calcium: 9 mg/dL (ref 8.9–10.3)
Chloride: 105 mmol/L (ref 98–111)
Creatinine: 1.29 mg/dL — ABNORMAL HIGH (ref 0.61–1.24)
GFR, Estimated: >60 mL/min (ref >60)
Glucose, Bld: 95 mg/dL (ref 70–99)
Potassium: 4 mmol/L (ref 3.5–5.1)
Sodium: 136 mmol/L (ref 135–145)
Total Bilirubin: 0.3 mg/dL (ref 0.3–1.2)
Total Protein: 7.4 g/dL (ref 6.5–8.1)

## 2022-09-27 MED ORDER — HEPARIN SOD (PORK) LOCK FLUSH 100 UNIT/ML IV SOLN
500.0000 [IU] | Freq: Once | INTRAVENOUS | Status: AC | PRN
  Administered 2022-09-27: 500 [IU]

## 2022-09-27 MED ORDER — PROCHLORPERAZINE MALEATE 10 MG PO TABS
10.0000 mg | ORAL_TABLET | Freq: Once | ORAL | Status: AC
  Administered 2022-09-27: 10 mg via ORAL
  Filled 2022-09-27: qty 1

## 2022-09-27 MED ORDER — SODIUM CHLORIDE 0.9% FLUSH
10.0000 mL | INTRAVENOUS | Status: DC | PRN
  Administered 2022-09-27: 10 mL

## 2022-09-27 MED ORDER — SODIUM CHLORIDE 0.9 % IV SOLN: Freq: Once | INTRAVENOUS | Status: AC

## 2022-09-27 MED ORDER — SODIUM CHLORIDE 0.9 % IV SOLN
0.9500 mg/kg | Freq: Once | INTRAVENOUS | Status: AC
  Administered 2022-09-27: 90 mg via INTRAVENOUS
  Filled 2022-09-27: qty 3

## 2022-09-27 MED ORDER — SODIUM CHLORIDE 0.9% FLUSH
10.0000 mL | Freq: Once | INTRAVENOUS | Status: AC
  Administered 2022-09-27: 10 mL

## 2022-09-27 NOTE — Patient Instructions (Signed)
[facility] ONCOLOG[facility]  Discharge Instructions: Thank you for choosing [facility] to provide your oncology and hematology care.   If you have a lab appointment with the [facility], please go directly to the [facility] and check in at the registration area.   Wear comfortable clothing and clothing appropriate for easy access to any Portacath or PICC line.   We strive to give you quality time with your provider. You may need to reschedule your appointment if you arrive late (15 or more minutes).  Arriving late affects you and other patients whose appointments are after yours.  Also, if you miss three or more appointments without notifying the office, you may be dismissed from the clinic at the provider's discretion.      For prescription refill requests, have your pharmacy contact our office and allow 72 hours for refills to be completed.    Today you received the following chemotherapy and/or immunotherapy agents; Enfortumab vendotin-ejfv (Padcev)      To help prevent nausea and vomiting after your treatment, we encourage you to take your nausea medication as directed.  BELOW ARE SYMPTOMS THAT SHOULD BE REPORTED IMMEDIATELY: *FEVER GREATER THAN 100.4 F (38 C) OR HIGHER *CHILLS OR SWEATING *NAUSEA AND VOMITING THAT IS NOT CONTROLLED WITH YOUR NAUSEA MEDICATION *UNUSUAL SHORTNESS OF BREATH *UNUSUAL BRUISING OR BLEEDING *URINARY PROBLEMS (pain or burning when urinating, or frequent urination) *BOWEL PROBLEMS (unusual diarrhea, constipation, pain near the anus) TENDERNESS IN MOUTH AND THROAT WITH OR WITHOUT PRESENCE OF ULCERS (sore throat, sores in mouth, or a toothache) UNUSUAL RASH, SWELLING OR PAIN  UNUSUAL VAGINAL DISCHARGE OR ITCHING   Items with * indicate a potential emergency and should be followed up as soon as possible or go to the Emergency Department if any problems should occur.  Please show the CHEMOTHERAPY ALERT CARD or IMMUNOTHERAPY  ALERT CARD at check-in to the Emergency Department and triage nurse.  Should you have questions after your visit or need to cancel or reschedule your appointment, please contact [facility]  Dept: [phone]  and follow the prompts.  Office hours are 8:00 a.m. to 4:30 p.m. Monday - Friday. Please note that voicemails left after 4:00 p.m. may not be returned until the following business day.  We are closed weekends and major holidays. You have access to a nurse at all times for urgent questions. Please call the main number to the clinic Dept: [phone] and follow the prompts.   For any non-urgent questions, you may also contact your provider using MyChart. We now offer e-Visits for anyone 21 and older to request care online for non-urgent symptoms. For details visit [email or web address].   Also download the MyChart app! Go to the app store, search "MyChart", open the app, select [facility], and log in with your MyChart username and password.  Masks are optional in the cancer centers. If you would like for your care team to wear a mask while they are taking care of you, please let them know. You may have one support Barry Gibson who is at least 60 years old accompany you for your appointments.

## 2022-09-30 ENCOUNTER — Telehealth: Payer: Self-pay | Admitting: *Deleted

## 2022-09-30 NOTE — Telephone Encounter (Signed)
Called & spoke to pt's wife who states pt is at work & doing well with treatment so far.  She denies any problems & knows how to reach [place] if needed & Knows appts.  She was very appreciative of this call.

## 2022-09-30 NOTE — Telephone Encounter (Signed)
-----   Message from Daphane Shepherd, RN sent at 09/27/2022  4:35 PM EST ----- Regarding: First time Chemo Patient of Dr. Alen Blew, first time Padcev. Has had chemo in the past. Tolerated treatment well.

## 2022-10-04 ENCOUNTER — Inpatient Hospital Stay: Payer: BC Managed Care – PPO

## 2022-10-04 ENCOUNTER — Other Ambulatory Visit: Payer: Self-pay

## 2022-10-04 VITALS — BP 126/74 | HR 74 | Temp 98.2°F | Resp 18 | Wt 207.0 lb

## 2022-10-04 DIAGNOSIS — C679 Malignant neoplasm of bladder, unspecified: Secondary | ICD-10-CM

## 2022-10-04 DIAGNOSIS — C670 Malignant neoplasm of trigone of bladder: Secondary | ICD-10-CM | POA: Diagnosis not present

## 2022-10-04 DIAGNOSIS — Z95828 Presence of other vascular implants and grafts: Secondary | ICD-10-CM

## 2022-10-04 LAB — CBC WITH DIFFERENTIAL (CANCER CENTER ONLY)
Abs Immature Granulocytes: 0.04 10*3/uL (ref 0.00–0.07)
Basophils Absolute: 0.1 10*3/uL (ref 0.0–0.1)
Basophils Relative: 1 %
Eosinophils Absolute: 0.1 10*3/uL (ref 0.0–0.5)
Eosinophils Relative: 2 %
HCT: 38.5 % — ABNORMAL LOW (ref 39.0–52.0)
Hemoglobin: 13.1 g/dL (ref 13.0–17.0)
Immature Granulocytes: 1 %
Lymphocytes Relative: 16 %
Lymphs Abs: 1.3 10*3/uL (ref 0.7–4.0)
MCH: 29.8 pg (ref 26.0–34.0)
MCHC: 34 g/dL (ref 30.0–36.0)
MCV: 87.5 fL (ref 80.0–100.0)
Monocytes Absolute: 0.4 10*3/uL (ref 0.1–1.0)
Monocytes Relative: 6 %
Neutro Abs: 6 10*3/uL (ref 1.7–7.7)
Neutrophils Relative %: 74 %
Platelet Count: 141 10*3/uL — ABNORMAL LOW (ref 150–400)
RBC: 4.4 MIL/uL (ref 4.22–5.81)
RDW: 14.9 % (ref 11.5–15.5)
WBC Count: 8 10*3/uL (ref 4.0–10.5)
nRBC: 0 % (ref 0.0–0.2)

## 2022-10-04 LAB — CMP (CANCER CENTER ONLY)
ALT: 10 U/L (ref 0–44)
AST: 22 U/L (ref 15–41)
Albumin: 3.8 g/dL (ref 3.5–5.0)
Alkaline Phosphatase: 672 U/L — ABNORMAL HIGH (ref 38–126)
Anion gap: 5 (ref 5–15)
BUN: 24 mg/dL — ABNORMAL HIGH (ref 6–20)
CO2: 25 mmol/L (ref 22–32)
Calcium: 8.7 mg/dL — ABNORMAL LOW (ref 8.9–10.3)
Chloride: 105 mmol/L (ref 98–111)
Creatinine: 1.02 mg/dL (ref 0.61–1.24)
GFR, Estimated: >60 mL/min (ref >60)
Glucose, Bld: 104 mg/dL — ABNORMAL HIGH (ref 70–99)
Potassium: 4 mmol/L (ref 3.5–5.1)
Sodium: 135 mmol/L (ref 135–145)
Total Bilirubin: 0.4 mg/dL (ref 0.3–1.2)
Total Protein: 7 g/dL (ref 6.5–8.1)

## 2022-10-04 MED ORDER — PROCHLORPERAZINE MALEATE 10 MG PO TABS
10.0000 mg | ORAL_TABLET | Freq: Once | ORAL | Status: AC
  Administered 2022-10-04: 10 mg via ORAL
  Filled 2022-10-04: qty 1

## 2022-10-04 MED ORDER — SODIUM CHLORIDE 0.9% FLUSH
10.0000 mL | INTRAVENOUS | Status: DC | PRN
  Administered 2022-10-04: 10 mL

## 2022-10-04 MED ORDER — SODIUM CHLORIDE 0.9 % IV SOLN
0.9500 mg/kg | Freq: Once | INTRAVENOUS | Status: AC
  Administered 2022-10-04: 90 mg via INTRAVENOUS
  Filled 2022-10-04: qty 9

## 2022-10-04 MED ORDER — HEPARIN SOD (PORK) LOCK FLUSH 100 UNIT/ML IV SOLN
500.0000 [IU] | Freq: Once | INTRAVENOUS | Status: AC | PRN
  Administered 2022-10-04: 500 [IU]

## 2022-10-04 MED ORDER — SODIUM CHLORIDE 0.9% FLUSH
10.0000 mL | Freq: Once | INTRAVENOUS | Status: AC
  Administered 2022-10-04: 10 mL

## 2022-10-04 MED ORDER — SODIUM CHLORIDE 0.9 % IV SOLN: Freq: Once | INTRAVENOUS | Status: AC

## 2022-10-04 NOTE — Patient Instructions (Signed)
[facility] ONCOLOG[facility]  Discharge Instructions: Thank you for choosing [facility] to provide your oncology and hematology care.   If you have a lab appointment with the [facility], please go directly to the [facility] and check in at the registration area.   Wear comfortable clothing and clothing appropriate for easy access to any Portacath or PICC line.   We strive to give you quality time with your provider. You may need to reschedule your appointment if you arrive late (15 or more minutes).  Arriving late affects you and other patients whose appointments are after yours.  Also, if you miss three or more appointments without notifying the office, you may be dismissed from the clinic at the provider's discretion.      For prescription refill requests, have your pharmacy contact our office and allow 72 hours for refills to be completed.    Today you received the following chemotherapy and/or immunotherapy agents Enfortumab      To help prevent nausea and vomiting after your treatment, we encourage you to take your nausea medication as directed.  BELOW ARE SYMPTOMS THAT SHOULD BE REPORTED IMMEDIATELY: *FEVER GREATER THAN 100.4 F (38 C) OR HIGHER *CHILLS OR SWEATING *NAUSEA AND VOMITING THAT IS NOT CONTROLLED WITH YOUR NAUSEA MEDICATION *UNUSUAL SHORTNESS OF BREATH *UNUSUAL BRUISING OR BLEEDING *URINARY PROBLEMS (pain or burning when urinating, or frequent urination) *BOWEL PROBLEMS (unusual diarrhea, constipation, pain near the anus) TENDERNESS IN MOUTH AND THROAT WITH OR WITHOUT PRESENCE OF ULCERS (sore throat, sores in mouth, or a toothache) UNUSUAL RASH, SWELLING OR PAIN  UNUSUAL VAGINAL DISCHARGE OR ITCHING   Items with * indicate a potential emergency and should be followed up as soon as possible or go to the Emergency Department if any problems should occur.  Please show the CHEMOTHERAPY ALERT CARD or IMMUNOTHERAPY ALERT CARD at check-in to  the Emergency Department and triage nurse.  Should you have questions after your visit or need to cancel or reschedule your appointment, please contact [facility]  Dept: [phone]  and follow the prompts.  Office hours are 8:00 a.m. to 4:30 p.m. Monday - Friday. Please note that voicemails left after 4:00 p.m. may not be returned until the following business day.  We are closed weekends and major holidays. You have access to a nurse at all times for urgent questions. Please call the main number to the clinic Dept: [phone] and follow the prompts.   For any non-urgent questions, you may also contact your provider using MyChart. We now offer e-Visits for anyone 37 and older to request care online for non-urgent symptoms. For details visit [email or web address].   Also download the MyChart app! Go to the app store, search "MyChart", open the app, select [facility], and log in with your MyChart username and password.  Masks are optional in the cancer centers. If you would like for your care team to wear a mask while they are taking care of you, please let them know. You may have one support person who is at least 60 years old accompany you for your appointments.

## 2022-10-07 ENCOUNTER — Other Ambulatory Visit: Payer: Self-pay | Admitting: *Deleted

## 2022-10-07 ENCOUNTER — Telehealth: Payer: Self-pay | Admitting: *Deleted

## 2022-10-07 DIAGNOSIS — C679 Malignant neoplasm of bladder, unspecified: Secondary | ICD-10-CM

## 2022-10-07 MED ORDER — ONDANSETRON 4 MG PO TBDP: 4.0000 mg | ORAL_TABLET | Freq: Three times a day (TID) | ORAL | 1 refills | Status: DC | PRN

## 2022-10-07 NOTE — Telephone Encounter (Signed)
PC to patient's wife, Barnett Applebaum, informed her of Dr Hazeline Junker message below, she verbalizes understanding.

## 2022-10-07 NOTE — Telephone Encounter (Signed)
-----   Message from Wyatt Portela, MD sent at 10/07/2022  9:06 AM EST ----- I have no objections.  ----- Message ----- From: Rolene Course, RN Sent: 10/07/2022   8:52 AM EST To: Wyatt Portela, MD  This patient is asking for your thoughts about taking CoQ10, his pharmacist recommended this for him.

## 2022-10-08 ENCOUNTER — Telehealth: Payer: Self-pay | Admitting: *Deleted

## 2022-10-08 NOTE — Telephone Encounter (Signed)
-----   Message from Wyatt Portela, MD sent at 10/08/2022  9:21 AM EST ----- Yes ----- Message ----- From: Rolene Course, RN Sent: 10/08/2022   9:18 AM EST To: Wyatt Portela, MD  OK should he just plan on seeing you on 12/8 as scheduled?  ----- Message ----- From: Wyatt Portela, MD Sent: 10/08/2022   9:06 AM EST To: Rolene Course, RN  Treatment can withheld on Friday if he is not well ----- Message ----- From: Rolene Course, RN Sent: 10/08/2022   9:02 AM EST To: Wyatt Portela, MD  This patient's wife called this morning, he is having a hard time since he has received Padcev the last two weeks.  He is extremely fatigued & is having some nausea & vomiting in the mornings despite taking zofran.  His appetite is decreased as well.  They are wondering if his tx regimen may need to be changed.  He is not sure he can tolerate another infusion this Friday.

## 2022-10-08 NOTE — Telephone Encounter (Signed)
Returned PC to patient's wife Barnett Applebaum, informed her this Friday's appointment may be canceled & will keep appointments planned for 10/25/22 - lab/MD/infusion.  She agrees with this plan, appointments for 10/11/22 canceled.

## 2022-10-11 ENCOUNTER — Inpatient Hospital Stay: Payer: BC Managed Care – PPO

## 2022-10-14 ENCOUNTER — Telehealth: Payer: Self-pay | Admitting: Oncology

## 2022-10-14 ENCOUNTER — Telehealth: Payer: Self-pay

## 2022-10-14 NOTE — Telephone Encounter (Signed)
Returned call to Barnett Applebaum (patient's wife) regarding a voicemail that was left. All questions and concerns were addressed, and there were no additional concerns at this time.

## 2022-10-14 NOTE — Telephone Encounter (Signed)
Called patient regarding upcoming December appointment, patient is notified. 

## 2022-10-15 ENCOUNTER — Telehealth: Payer: Self-pay | Admitting: *Deleted

## 2022-10-15 NOTE — Telephone Encounter (Signed)
Received [id number] FMLA form today without a start date and ROI.  Connected with [name], [phone]).   "Last Tuesday was the last day I worked for this continuous leave of absence.  I was supposed to work 10/09/2022.  I live in [place]; unable to come in to sign a release form."  Spouse also on call says "he has a 3:00 pm appointment tomorrow.  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  Prepared ROI for [facility].  Will use 10/09/2022 as start date of leave.  Currently no further questions or needs.

## 2022-10-25 ENCOUNTER — Inpatient Hospital Stay: Payer: BC Managed Care – PPO

## 2022-10-25 ENCOUNTER — Inpatient Hospital Stay (HOSPITAL_BASED_OUTPATIENT_CLINIC_OR_DEPARTMENT_OTHER): Payer: BC Managed Care – PPO | Admitting: Oncology

## 2022-10-25 ENCOUNTER — Other Ambulatory Visit: Payer: Self-pay

## 2022-10-25 ENCOUNTER — Inpatient Hospital Stay: Payer: BC Managed Care – PPO | Attending: Oncology

## 2022-10-25 VITALS — BP 97/66 | HR 89 | Temp 97.7°F | Resp 17 | Ht 71.0 in | Wt 197.4 lb

## 2022-10-25 VITALS — BP 110/70 | HR 75 | Resp 18

## 2022-10-25 DIAGNOSIS — Z883 Allergy status to other anti-infective agents status: Secondary | ICD-10-CM | POA: Insufficient documentation

## 2022-10-25 DIAGNOSIS — R627 Adult failure to thrive: Secondary | ICD-10-CM | POA: Insufficient documentation

## 2022-10-25 DIAGNOSIS — R630 Anorexia: Secondary | ICD-10-CM | POA: Diagnosis not present

## 2022-10-25 DIAGNOSIS — Z79899 Other long term (current) drug therapy: Secondary | ICD-10-CM | POA: Diagnosis not present

## 2022-10-25 DIAGNOSIS — Z95828 Presence of other vascular implants and grafts: Secondary | ICD-10-CM

## 2022-10-25 DIAGNOSIS — C670 Malignant neoplasm of trigone of bladder: Secondary | ICD-10-CM | POA: Insufficient documentation

## 2022-10-25 DIAGNOSIS — R590 Localized enlarged lymph nodes: Secondary | ICD-10-CM | POA: Diagnosis not present

## 2022-10-25 DIAGNOSIS — C679 Malignant neoplasm of bladder, unspecified: Secondary | ICD-10-CM

## 2022-10-25 DIAGNOSIS — R112 Nausea with vomiting, unspecified: Secondary | ICD-10-CM | POA: Insufficient documentation

## 2022-10-25 DIAGNOSIS — N390 Urinary tract infection, site not specified: Secondary | ICD-10-CM | POA: Insufficient documentation

## 2022-10-25 DIAGNOSIS — R634 Abnormal weight loss: Secondary | ICD-10-CM | POA: Diagnosis not present

## 2022-10-25 DIAGNOSIS — Z5112 Encounter for antineoplastic immunotherapy: Secondary | ICD-10-CM | POA: Diagnosis present

## 2022-10-25 LAB — CBC WITH DIFFERENTIAL (CANCER CENTER ONLY)
Abs Immature Granulocytes: 0.01 10*3/uL (ref 0.00–0.07)
Basophils Absolute: 0.1 10*3/uL (ref 0.0–0.1)
Basophils Relative: 2 %
Eosinophils Absolute: 0.2 10*3/uL (ref 0.0–0.5)
Eosinophils Relative: 5 %
HCT: 39 % (ref 39.0–52.0)
Hemoglobin: 13.1 g/dL (ref 13.0–17.0)
Immature Granulocytes: 0 %
Lymphocytes Relative: 28 %
Lymphs Abs: 1.3 10*3/uL (ref 0.7–4.0)
MCH: 29.2 pg (ref 26.0–34.0)
MCHC: 33.6 g/dL (ref 30.0–36.0)
MCV: 86.9 fL (ref 80.0–100.0)
Monocytes Absolute: 0.4 10*3/uL (ref 0.1–1.0)
Monocytes Relative: 9 %
Neutro Abs: 2.6 10*3/uL (ref 1.7–7.7)
Neutrophils Relative %: 56 %
Platelet Count: 267 10*3/uL (ref 150–400)
RBC: 4.49 MIL/uL (ref 4.22–5.81)
RDW: 15.5 % (ref 11.5–15.5)
WBC Count: 4.7 10*3/uL (ref 4.0–10.5)
nRBC: 0 % (ref 0.0–0.2)

## 2022-10-25 LAB — CMP (CANCER CENTER ONLY)
ALT: 6 U/L (ref 0–44)
AST: 24 U/L (ref 15–41)
Albumin: 3.5 g/dL (ref 3.5–5.0)
Alkaline Phosphatase: 683 U/L — ABNORMAL HIGH (ref 38–126)
Anion gap: 5 (ref 5–15)
BUN: 20 mg/dL (ref 6–20)
CO2: 25 mmol/L (ref 22–32)
Calcium: 9.1 mg/dL (ref 8.9–10.3)
Chloride: 104 mmol/L (ref 98–111)
Creatinine: 1.25 mg/dL — ABNORMAL HIGH (ref 0.61–1.24)
GFR, Estimated: >60 mL/min (ref >60)
Glucose, Bld: 90 mg/dL (ref 70–99)
Potassium: 3.8 mmol/L (ref 3.5–5.1)
Sodium: 134 mmol/L — ABNORMAL LOW (ref 135–145)
Total Bilirubin: 0.4 mg/dL (ref 0.3–1.2)
Total Protein: 6.3 g/dL — ABNORMAL LOW (ref 6.5–8.1)

## 2022-10-25 MED ORDER — SODIUM CHLORIDE 0.9 % IV SOLN: 8.0000 mg | Freq: Once | INTRAVENOUS | Status: DC

## 2022-10-25 MED ORDER — ONDANSETRON 4 MG PO TBDP: 4.0000 mg | ORAL_TABLET | Freq: Three times a day (TID) | ORAL | 1 refills | Status: DC | PRN

## 2022-10-25 MED ORDER — ONDANSETRON HCL 4 MG/2ML IJ SOLN
8.0000 mg | Freq: Once | INTRAMUSCULAR | Status: AC
  Administered 2022-10-25: 8 mg via INTRAVENOUS
  Filled 2022-10-25: qty 4

## 2022-10-25 MED ORDER — SODIUM CHLORIDE 0.9% FLUSH
10.0000 mL | Freq: Once | INTRAVENOUS | Status: AC
  Administered 2022-10-25: 10 mL

## 2022-10-25 MED ORDER — SODIUM CHLORIDE 0.9 % IV SOLN: Freq: Once | INTRAVENOUS | Status: AC

## 2022-10-25 MED ORDER — HEPARIN SOD (PORK) LOCK FLUSH 100 UNIT/ML IV SOLN
500.0000 [IU] | Freq: Once | INTRAVENOUS | Status: AC
  Administered 2022-10-25: 500 [IU]

## 2022-10-25 MED ORDER — PROMETHAZINE HCL 25 MG PO TABS: 25.0000 mg | ORAL_TABLET | Freq: Four times a day (QID) | ORAL | 0 refills | Status: DC | PRN

## 2022-10-25 MED ORDER — DEXAMETHASONE 4 MG PO TABS: 4.0000 mg | ORAL_TABLET | Freq: Two times a day (BID) | ORAL | 3 refills | Status: AC

## 2022-10-25 NOTE — Progress Notes (Signed)
Hematology and Oncology Follow Up Visit  Barry Gibson [id number] 08-27-1961 61 y.o. 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   Prior Therapy:   He underwent robotic assisted laparoscopic radical cystectomy and lymphadenectomy January 2022.  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  He had 4 out of 7 lymph nodes involved with the final pathological staging is T4N2 disease.     He underwent staging scans on April 19, 2021 which showed interval progression of abdominal pelvic adenopathy consistent with worsening nodal metastasis.    Chemotherapy utilizing gemcitabine and cisplatin started on June 08, 2021 receiving day 1 cisplatin and gemcitabine with day 8 gemcitabine alone.  He completed cycle 4 on August 17, 2021.     Pembrolizumab 200 mg every 3 weeks started on September 07, 2021.  He is currently receiving 400 mg every 6 weeks.  Last treatment was given on July 19, 2022.  He developed a mild progression of disease.  Current therapy: Padcev 1 mg/kg weekly started on September 27, 2022.  He received a day 1 and day 8 of cycle 1 of therapy.  He is here for evaluation prior to start of cycle 2.   Interim History: Barry Gibson is here for a repeat follow-up visit.  Since the last visit, he reports a few complications related to therapy.  He is continue to struggle with nausea and vomiting which Zofran has helped initially but reporting less effectiveness at this time.  He has also lost some weight close to 10 pounds since last evaluation.  He has not reported any neuropathy dermatological toxicities.  He denies any hospitalizations.  His energy and performance status has declined since the start of  treatment.    Medications: Updated on review. Current Outpatient Medications  Medication Sig Dispense Refill   acetaminophen (TYLENOL) 650 MG CR tablet Take 1,300 mg by mouth every morning.     amLODipine (NORVASC) 5 MG tablet Take 1 tablet (5 mg total) by mouth daily. 90 tablet 3   hydroxychloroquine (PLAQUENIL) 200 MG tablet Take 200 mg by mouth 2 (two) times daily.     losartan (COZAAR) 25 MG tablet Take 1 tablet (25 mg total) by mouth daily. 90 tablet 3   Multiple Vitamin (MULTIVITAMIN WITH MINERALS) TABS tablet Take 1 tablet by mouth daily.     omeprazole (PRILOSEC) 40 MG capsule Take 40 mg by mouth daily.     ondansetron (ZOFRAN-ODT) 4 MG disintegrating tablet Take 1 tablet (4 mg total) by mouth every 8 (eight) hours as needed for nausea or vomiting. 20 tablet 1   prednisoLONE acetate (PRED FORTE) 1 % ophthalmic suspension Place 1 drop into the left eye 2 (two) times a week. Instill 1 drop into left eye Monday and Friday.     rosuvastatin (CRESTOR) 20 MG tablet TAKE 1 TABLET(20 MG) BY MOUTH DAILY (Patient taking differently: Take 20 mg by mouth daily.) 90 tablet 1   sertraline (ZOLOFT) 100 MG tablet Take 1 tablet (100 mg total) by mouth daily. 90 tablet 3   No current facility-administered medications for this visit.   Facility-Administered Medications Ordered in Other Visits  Medication Dose Route Frequency Provider Last Rate Last Admin   acetaminophen (TYLENOL) 325 MG tablet  diphenhydrAMINE (BENADRYL) 25 mg capsule            gemcitabine (GEMZAR) chemo syringe for bladder instillation 2,000 mg  2,000 mg Bladder Instillation Once Franchot Gallo, MD       gemcitabine [facility]) chemo syringe for bladder instillation 2,000 mg  2,000 mg Bladder Instillation Once Franchot Gallo, MD         Allergies:  Allergies  Allergen Reactions   Ace Inhibitors Cough   Diflucan [Fluconazole] Other (See Comments)    Irritated ulcers      Physical  Exam:          Blood pressure 97/66, pulse 89, temperature 97.7 F (36.5 C), temperature source Temporal, resp. rate 17, height '5\' 11"'$  (1.803 m), weight 197 lb 6.4 oz (89.5 kg), SpO2 98 %.      ECOG: 1    General appearance: Alert, awake without any distress. Head: Atraumatic without abnormalities Oropharynx: Without any thrush or ulcers. Eyes: No scleral icterus. Lymph nodes: No lymphadenopathy noted in the cervical, supraclavicular, or axillary nodes Heart:regular rate and rhythm, without any murmurs or gallops.   Lung: Clear to auscultation without any rhonchi, wheezes or dullness to percussion. Abdomin: Soft, nontender without any shifting dullness or ascites. Musculoskeletal: No clubbing or cyanosis. Neurological: No motor or sensory deficits. Skin: No rashes or lesions.               Lab Results: Lab Results  Component Value Date   WBC 8.0 10/04/2022   HGB 13.1 10/04/2022   HCT 38.5 (L) 10/04/2022   MCV 87.5 10/04/2022   PLT 141 (L) 10/04/2022   PSA 0.53 11/25/2014     Chemistry      Component Value Date/Time   NA 135 10/04/2022 1311   NA 141 11/02/2021 1512   K 4.0 10/04/2022 1311   CL 105 10/04/2022 1311   CO2 25 10/04/2022 1311   BUN 24 (H) 10/04/2022 1311   BUN 23 11/02/2021 1512   CREATININE 1.02 10/04/2022 1311   CREATININE 1.05 11/25/2014 0826      Component Value Date/Time   CALCIUM 8.7 (L) 10/04/2022 1311   ALKPHOS 672 (H) 10/04/2022 1311   AST 22 10/04/2022 1311   ALT 10 10/04/2022 1311   BILITOT 0.4 10/04/2022 1311         Impression and Plan:  61 year old man with:  1.  Stage IV high-grade urothelial carcinoma of the bladder with pelvic adenopathy diagnosed in 2022.   He is currently on Padcev due to progression of disease on Pembrolizumab and received the first 2 weeks of the first cycle.  Risks and benefits of continuing with this treatment were discussed at this time.  Complications that include nausea,  vomiting, neuropathy, hyperglycemia and dermatological toxicities were reiterated.  He has experienced a few complications predominantly GI toxicity including anorexia and nausea.    Based on these findings I will continue to hold treatment and tentatively start day 1 of cycle 2 on December 15th 2023.  He will receive treatment day 1 and day 15 only with a dose reduction of 50%.  Further dose reduction or change of therapy will be considered if she continues to have issues.   His alkaline phosphatase continues to be elevated with unclear etiology.  No hepatic or bone involvement has been detected but certainly manifestation of malignancy is considered.   2.  Urinary tract infection: Resolved at this time without any fevers or any signs and symptoms to suggest recurrence.  3.  Nausea and vomiting and failure to thrive: Presumably related to Padcev.  He will receive IV hydration today and Zofran intravenously.  Will switch his antiemetics including short course of dexamethasone as well as Phenergan.  4.  IV access: Port-A-Cath remains in place without any issues at this time.     5.  Follow-up: He will return in 1 week for tentatively start cycle 2 of therapy.   30  minutes were dedicated to this visit.  The time was spent on updating disease status, addressing complications related to cancer and cancer therapy.  Zola Button, MD 12/8/20231:44 PM

## 2022-11-01 ENCOUNTER — Inpatient Hospital Stay: Payer: BC Managed Care – PPO

## 2022-11-01 ENCOUNTER — Other Ambulatory Visit: Payer: Self-pay

## 2022-11-01 VITALS — BP 114/89 | HR 67 | Temp 98.0°F | Resp 18

## 2022-11-01 DIAGNOSIS — C679 Malignant neoplasm of bladder, unspecified: Secondary | ICD-10-CM

## 2022-11-01 DIAGNOSIS — Z95828 Presence of other vascular implants and grafts: Secondary | ICD-10-CM

## 2022-11-01 DIAGNOSIS — N390 Urinary tract infection, site not specified: Secondary | ICD-10-CM

## 2022-11-01 DIAGNOSIS — C670 Malignant neoplasm of trigone of bladder: Secondary | ICD-10-CM | POA: Diagnosis not present

## 2022-11-01 LAB — CBC WITH DIFFERENTIAL (CANCER CENTER ONLY)
Abs Immature Granulocytes: 0.31 10*3/uL — ABNORMAL HIGH (ref 0.00–0.07)
Basophils Absolute: 0 10*3/uL (ref 0.0–0.1)
Basophils Relative: 0 %
Eosinophils Absolute: 0 10*3/uL (ref 0.0–0.5)
Eosinophils Relative: 0 %
HCT: 37.9 % — ABNORMAL LOW (ref 39.0–52.0)
Hemoglobin: 13 g/dL (ref 13.0–17.0)
Immature Granulocytes: 3 %
Lymphocytes Relative: 15 %
Lymphs Abs: 1.9 10*3/uL (ref 0.7–4.0)
MCH: 29.1 pg (ref 26.0–34.0)
MCHC: 34.3 g/dL (ref 30.0–36.0)
MCV: 85 fL (ref 80.0–100.0)
Monocytes Absolute: 0.9 10*3/uL (ref 0.1–1.0)
Monocytes Relative: 7 %
Neutro Abs: 9.5 10*3/uL — ABNORMAL HIGH (ref 1.7–7.7)
Neutrophils Relative %: 75 %
Platelet Count: 169 10*3/uL (ref 150–400)
RBC: 4.46 MIL/uL (ref 4.22–5.81)
RDW: 15.7 % — ABNORMAL HIGH (ref 11.5–15.5)
WBC Count: 12.6 10*3/uL — ABNORMAL HIGH (ref 4.0–10.5)
nRBC: 0 % (ref 0.0–0.2)

## 2022-11-01 LAB — CMP (CANCER CENTER ONLY)
ALT: 28 U/L (ref 0–44)
AST: 37 U/L (ref 15–41)
Albumin: 3.4 g/dL — ABNORMAL LOW (ref 3.5–5.0)
Alkaline Phosphatase: 830 U/L — ABNORMAL HIGH (ref 38–126)
Anion gap: 6 (ref 5–15)
BUN: 23 mg/dL — ABNORMAL HIGH (ref 6–20)
CO2: 25 mmol/L (ref 22–32)
Calcium: 8.4 mg/dL — ABNORMAL LOW (ref 8.9–10.3)
Chloride: 105 mmol/L (ref 98–111)
Creatinine: 1.1 mg/dL (ref 0.61–1.24)
GFR, Estimated: >60 mL/min (ref >60)
Glucose, Bld: 105 mg/dL — ABNORMAL HIGH (ref 70–99)
Potassium: 3.9 mmol/L (ref 3.5–5.1)
Sodium: 136 mmol/L (ref 135–145)
Total Bilirubin: 0.3 mg/dL (ref 0.3–1.2)
Total Protein: 5.8 g/dL — ABNORMAL LOW (ref 6.5–8.1)

## 2022-11-01 MED ORDER — HEPARIN SOD (PORK) LOCK FLUSH 100 UNIT/ML IV SOLN
500.0000 [IU] | Freq: Once | INTRAVENOUS | Status: AC | PRN
  Administered 2022-11-01: 500 [IU]

## 2022-11-01 MED ORDER — PROCHLORPERAZINE MALEATE 10 MG PO TABS
10.0000 mg | ORAL_TABLET | Freq: Once | ORAL | Status: AC
  Administered 2022-11-01: 10 mg via ORAL
  Filled 2022-11-01: qty 1

## 2022-11-01 MED ORDER — SODIUM CHLORIDE 0.9 % IV SOLN
0.5300 mg/kg | Freq: Once | INTRAVENOUS | Status: AC
  Administered 2022-11-01: 50 mg via INTRAVENOUS
  Filled 2022-11-01: qty 5

## 2022-11-01 MED ORDER — SODIUM CHLORIDE 0.9 % IV SOLN: Freq: Once | INTRAVENOUS | Status: AC

## 2022-11-01 MED ORDER — SODIUM CHLORIDE 0.9% FLUSH
10.0000 mL | INTRAVENOUS | Status: DC | PRN
  Administered 2022-11-01: 10 mL

## 2022-11-01 MED ORDER — SODIUM CHLORIDE 0.9% FLUSH
10.0000 mL | Freq: Once | INTRAVENOUS | Status: AC
  Administered 2022-11-01: 10 mL

## 2022-11-01 NOTE — Patient Instructions (Signed)
[facility]  Discharge Instructions: Thank you for choosing [facility] to provide your oncology and hematology care.   If you have a lab appointment with the [facility], please go directly to the [facility] and check in at the registration area.   Wear comfortable clothing and clothing appropriate for easy access to any Portacath or PICC line.   We strive to give you quality time with your provider. You may need to reschedule your appointment if you arrive late (15 or more minutes).  Arriving late affects you and other patients whose appointments are after yours.  Also, if you miss three or more appointments without notifying the office, you may be dismissed from the clinic at the provider's discretion.      For prescription refill requests, have your pharmacy contact our office and allow 72 hours for refills to be completed.    Today you received the following chemotherapy and/or immunotherapy agents: Padcev      To help prevent nausea and vomiting after your treatment, we encourage you to take your nausea medication as directed.  BELOW ARE SYMPTOMS THAT SHOULD BE REPORTED IMMEDIATELY: *FEVER GREATER THAN 100.4 F (38 C) OR HIGHER *CHILLS OR SWEATING *NAUSEA AND VOMITING THAT IS NOT CONTROLLED WITH YOUR NAUSEA MEDICATION *UNUSUAL SHORTNESS OF BREATH *UNUSUAL BRUISING OR BLEEDING *URINARY PROBLEMS (pain or burning when urinating, or frequent urination) *BOWEL PROBLEMS (unusual diarrhea, constipation, pain near the anus) TENDERNESS IN MOUTH AND THROAT WITH OR WITHOUT PRESENCE OF ULCERS (sore throat, sores in mouth, or a toothache) UNUSUAL RASH, SWELLING OR PAIN  UNUSUAL VAGINAL DISCHARGE OR ITCHING   Items with * indicate a potential emergency and should be followed up as soon as possible or go to the Emergency Department if any problems should occur.  Please show the CHEMOTHERAPY ALERT CARD or IMMUNOTHERAPY ALERT CARD at check-in to the  Emergency Department and triage nurse.  Should you have questions after your visit or need to cancel or reschedule your appointment, please contact [facility]  Dept: [phone]  and follow the prompts.  Office hours are 8:00 a.m. to 4:30 p.m. Monday - Friday. Please note that voicemails left after 4:00 p.m. may not be returned until the following business day.  We are closed weekends and major holidays. You have access to a nurse at all times for urgent questions. Please call the main number to the clinic Dept: [phone] and follow the prompts.   For any non-urgent questions, you may also contact your provider using MyChart. We now offer e-Visits for anyone 74 and older to request care online for non-urgent symptoms. For details visit [email or web address].   Also download the MyChart app! Go to the app store, search "MyChart", open the app, select [facility], and log in with your MyChart username and password.  Masks are optional in the cancer centers. If you would like for your care team to wear a mask while they are taking care of you, please let them know. You may have one support person who is at least 60 years old accompany you for your appointments.

## 2022-11-03 ENCOUNTER — Emergency Department (HOSPITAL_COMMUNITY)
Admission: EM | Admit: 2022-11-03 | Discharge: 2022-11-03 | Disposition: A | Discharge status: Home / Self Care | Payer: BC Managed Care – PPO | Attending: Emergency Medicine | Admitting: Emergency Medicine

## 2022-11-03 ENCOUNTER — Encounter (HOSPITAL_COMMUNITY): Payer: Self-pay

## 2022-11-03 ENCOUNTER — Other Ambulatory Visit: Payer: Self-pay

## 2022-11-03 DIAGNOSIS — R339 Retention of urine, unspecified: Secondary | ICD-10-CM | POA: Diagnosis present

## 2022-11-03 DIAGNOSIS — T83098A Other mechanical complication of other indwelling urethral catheter, initial encounter: Secondary | ICD-10-CM | POA: Insufficient documentation

## 2022-11-03 DIAGNOSIS — D72829 Elevated white blood cell count, unspecified: Secondary | ICD-10-CM | POA: Insufficient documentation

## 2022-11-03 DIAGNOSIS — Y838 Other surgical procedures as the cause of abnormal reaction of the patient, or of later complication, without mention of misadventure at the time of the procedure: Secondary | ICD-10-CM | POA: Insufficient documentation

## 2022-11-03 DIAGNOSIS — N99528 Other complication of other external stoma of urinary tract: Secondary | ICD-10-CM

## 2022-11-03 LAB — URINALYSIS, ROUTINE W REFLEX MICROSCOPIC
Bilirubin Urine: NEGATIVE
Glucose, UA: NEGATIVE mg/dL
Ketones, ur: NEGATIVE mg/dL
Nitrite: NEGATIVE
Protein, ur: 100 mg/dL — AB
RBC / HPF: >50 RBC/hpf — ABNORMAL HIGH (ref 0–5)
Specific Gravity, Urine: 1.013 (ref 1.005–1.030)
WBC, UA: >50 WBC/hpf — ABNORMAL HIGH (ref 0–5)
pH: 6 (ref 5.0–8.0)

## 2022-11-03 LAB — CBC WITH DIFFERENTIAL/PLATELET
Abs Immature Granulocytes: 0.6 10*3/uL — ABNORMAL HIGH (ref 0.00–0.07)
Band Neutrophils: 1 %
Basophils Absolute: 0 10*3/uL (ref 0.0–0.1)
Basophils Relative: 0 %
Eosinophils Absolute: 0 10*3/uL (ref 0.0–0.5)
Eosinophils Relative: 0 %
HCT: 43.7 % (ref 39.0–52.0)
Hemoglobin: 14.4 g/dL (ref 13.0–17.0)
Lymphocytes Relative: 12 %
Lymphs Abs: 1.9 10*3/uL (ref 0.7–4.0)
MCH: 28.8 pg (ref 26.0–34.0)
MCHC: 33 g/dL (ref 30.0–36.0)
MCV: 87.4 fL (ref 80.0–100.0)
Metamyelocytes Relative: 2 %
Monocytes Absolute: 0.6 10*3/uL (ref 0.1–1.0)
Monocytes Relative: 4 %
Myelocytes: 2 %
Neutro Abs: 12.6 10*3/uL — ABNORMAL HIGH (ref 1.7–7.7)
Neutrophils Relative %: 79 %
Platelets: 161 10*3/uL (ref 150–400)
RBC: 5 MIL/uL (ref 4.22–5.81)
RDW: 16.1 % — ABNORMAL HIGH (ref 11.5–15.5)
WBC: 15.8 10*3/uL — ABNORMAL HIGH (ref 4.0–10.5)
nRBC: 0 % (ref 0.0–0.2)

## 2022-11-03 LAB — BASIC METABOLIC PANEL
Anion gap: 10 (ref 5–15)
BUN: 36 mg/dL — ABNORMAL HIGH (ref 6–20)
CO2: 23 mmol/L (ref 22–32)
Calcium: 8.2 mg/dL — ABNORMAL LOW (ref 8.9–10.3)
Chloride: 102 mmol/L (ref 98–111)
Creatinine, Ser: 1.67 mg/dL — ABNORMAL HIGH (ref 0.61–1.24)
GFR, Estimated: 47 mL/min — ABNORMAL LOW (ref >60)
Glucose, Bld: 133 mg/dL — ABNORMAL HIGH (ref 70–99)
Potassium: 4.4 mmol/L (ref 3.5–5.1)
Sodium: 135 mmol/L (ref 135–145)

## 2022-11-03 MED ORDER — ONDANSETRON HCL 4 MG/2ML IJ SOLN
4.0000 mg | Freq: Once | INTRAMUSCULAR | Status: AC
  Administered 2022-11-03: 4 mg via INTRAVENOUS
  Filled 2022-11-03: qty 2

## 2022-11-03 MED ORDER — HYDROMORPHONE HCL 1 MG/ML IJ SOLN
1.0000 mg | Freq: Once | INTRAMUSCULAR | Status: AC
  Administered 2022-11-03: 1 mg via INTRAVENOUS
  Filled 2022-11-03: qty 1

## 2022-11-03 MED ORDER — CIPROFLOXACIN HCL 500 MG PO TABS: 500.0000 mg | ORAL_TABLET | Freq: Two times a day (BID) | ORAL | 0 refills | Status: AC

## 2022-11-03 MED ORDER — LACTATED RINGERS IV BOLUS
1000.0000 mL | Freq: Once | INTRAVENOUS | Status: AC
  Administered 2022-11-03: 1000 mL via INTRAVENOUS

## 2022-11-03 MED ORDER — SODIUM CHLORIDE 0.9 % IV SOLN
2.0000 g | Freq: Once | INTRAVENOUS | Status: AC
  Administered 2022-11-03: 2 g via INTRAVENOUS
  Filled 2022-11-03: qty 20

## 2022-11-03 NOTE — ED Notes (Signed)
Size 16 coude foley placed into stoma of urostomy by Dr. Lenna Sciara. [place].

## 2022-11-03 NOTE — ED Provider Notes (Addendum)
[facility] EMERGENCY DEPARTMENT Provider Note   CSN: [id number] Arrival date & time: 11/03/22  0310     History  Chief Complaint  Patient presents with   Urinary Retention    Barry Gibson is a 61 y.o. male.  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  Patient has had any real issues with his ostomy before.  States that every once while stopped draining he just moves around a certain way and usually will start draining again.  He tried that multiple times without success.  Has had no output.        Home Medications Prior to Admission medications   Medication Sig Start Date End Date Taking? Authorizing Provider  ciprofloxacin (CIPRO) 500 MG tablet Take 1 tablet (500 mg total) by mouth every 12 (twelve) hours for 10 days. 11/03/22 11/13/22 Yes Christena Sunderlin, Corene Cornea, MD  acetaminophen (TYLENOL) 650 MG CR tablet Take 1,300 mg by mouth every morning.    [provider]  amLODipine (NORVASC) 5 MG tablet Take 1 tablet (5 mg total) by mouth daily. 09/02/22   Coral Spikes, DO  dexamethasone (DECADRON) 4 MG tablet Take 1 tablet (4 mg total) by mouth 2 (two) times daily. Take for 3 days after each chemotherapy treatment 10/25/22   Wyatt Portela, MD  hydroxychloroquine (PLAQUENIL) 200 MG tablet Take 200 mg by mouth 2 (two) times daily. 08/09/22   [provider]  losartan (COZAAR) 25 MG tablet Take 1 tablet (25 mg total) by mouth daily. 09/02/22   Coral Spikes, DO  Multiple Vitamin (MULTIVITAMIN WITH MINERALS) TABS tablet Take 1 tablet by mouth daily. 09/18/21   Debbe Odea, MD  omeprazole (PRILOSEC) 40 MG capsule Take 40 mg by mouth daily.    [provider]  ondansetron (ZOFRAN-ODT) 4 MG disintegrating tablet Take 1 tablet (4 mg total) by mouth every 8 (eight) hours as needed for nausea or vomiting. 10/25/22   Wyatt Portela, MD  prednisoLONE acetate (PRED FORTE) 1 %  ophthalmic suspension Place 1 drop into the left eye 2 (two) times a week. Instill 1 drop into left eye Monday and Friday.    [provider]  promethazine (PHENERGAN) 25 MG tablet Take 1 tablet (25 mg total) by mouth every 6 (six) hours as needed for nausea or vomiting. 10/25/22   Wyatt Portela, MD  rosuvastatin (CRESTOR) 20 MG tablet TAKE 1 TABLET(20 MG) BY MOUTH DAILY Patient taking differently: Take 20 mg by mouth daily. 03/18/22   Coral Spikes, DO  sertraline (ZOLOFT) 100 MG tablet Take 1 tablet (100 mg total) by mouth daily. 09/24/22   Coral Spikes, DO      Allergies    Ace inhibitors and Diflucan [fluconazole]    Review of Systems   Review of Systems  Physical Exam Updated Vital Signs BP 129/82   Pulse 66   Temp 98.3 F (36.8 C) (Oral)   Resp 16   SpO2 94%  Physical Exam Vitals and nursing note reviewed.  Constitutional:      Appearance: He is well-developed.  HENT:     Head: Normocephalic and atraumatic.  Eyes:     Pupils: Pupils are equal, round, and reactive to light.  Cardiovascular:     Rate and Rhythm: Normal rate.  Pulmonary:     Effort: Pulmonary effort is normal. No respiratory distress.  Abdominal:  General: Abdomen is flat. There is no distension.     Comments: Ostomy with some protruding tissue with edema, easily reduced with gentle consistent pressure.  Musculoskeletal:        General: Normal range of motion.     Cervical back: Normal range of motion.  Skin:    General: Skin is warm and dry.     Coloration: Skin is not jaundiced or pale.  Neurological:     General: No focal deficit present.     Mental Status: He is alert.     ED Results / Procedures / Treatments   Labs (all labs ordered are listed, but only abnormal results are displayed) Labs Reviewed  CBC WITH DIFFERENTIAL/PLATELET - Abnormal; Notable for the following components:      Result Value   WBC 15.8 (*)    RDW 16.1 (*)    Neutro Abs 12.6 (*)    Abs Immature  Granulocytes 0.60 (*)    All other components within normal limits  BASIC METABOLIC PANEL - Abnormal; Notable for the following components:   Glucose, Bld 133 (*)    BUN 36 (*)    Creatinine, Ser 1.67 (*)    Calcium 8.2 (*)    GFR, Estimated 47 (*)    All other components within normal limits  URINALYSIS, ROUTINE W REFLEX MICROSCOPIC - Abnormal; Notable for the following components:   APPearance HAZY (*)    Hgb urine dipstick LARGE (*)    Protein, ur 100 (*)    Leukocytes,Ua SMALL (*)    RBC / HPF >50 (*)    WBC, UA >50 (*)    Bacteria, UA MANY (*)    All other components within normal limits  URINE CULTURE    EKG None  Radiology No results found.  Procedures Procedures    Medications Ordered in ED Medications  HYDROmorphone (DILAUDID) injection 1 mg (1 mg Intravenous Given 11/03/22 0357)  ondansetron (ZOFRAN) injection 4 mg (4 mg Intravenous Given 11/03/22 0357)  lactated ringers bolus 1,000 mL (0 mLs Intravenous Stopped 11/03/22 0608)  cefTRIAXone (ROCEPHIN) 2 g in sodium chloride 0.9 % 100 mL IVPB (2 g Intravenous New Bag/Given 11/03/22 0608)    ED Course/ Medical Decision Making/ A&P                           Medical Decision Making Amount and/or Complexity of Data Reviewed Labs: ordered.  Risk Prescription drug management.   Unable to get any cutting or drainage even after reducing his edematous stoma site.  Discussed with Dr. Junious Silk with urology who suggested placement of a Foley catheter and the patient can do in and out as needed however family is uncomfortable with that so Dr. Junious Silk suggested but 2 cc of water in the balloon just to help with anchoring it and then he could follow-up with Dr. Tresa Moore on Monday. Message to Dr. Tresa Moore in Epic to be aware.   Urine does look infected.  Understanding that he has a stoma in open conduit for bacteria is many more bacteria, red blood cells, white blood cells and elevated white blood cell count compared to  previous so we will go ahead and treat.  On reviewing previous cultures she has been resistant to Rocephin in the past however I did not realize this until after the family informed me so we will start on Cipro at home.   Final Clinical Impression(s) / ED Diagnoses Final diagnoses:  Complication of  urostomy [facility])    Rx / DC Orders ED Discharge Orders          Ordered    ciprofloxacin (CIPRO) 500 MG tablet  Every 12 hours        11/03/22 0657             Tacie Mccuistion, Corene Cornea, MD 11/03/22 [phone]

## 2022-11-03 NOTE — ED Triage Notes (Signed)
Pt arrived from home via POV w c/o no urine coming from urostomy x 6 or 7 hours. Pain 10/10 in right flank, lower back area.

## 2022-11-04 LAB — URINE CULTURE

## 2022-11-06 ENCOUNTER — Inpatient Hospital Stay (HOSPITAL_BASED_OUTPATIENT_CLINIC_OR_DEPARTMENT_OTHER): Payer: BC Managed Care – PPO | Admitting: Physician Assistant

## 2022-11-06 ENCOUNTER — Inpatient Hospital Stay: Payer: BC Managed Care – PPO

## 2022-11-06 ENCOUNTER — Telehealth: Payer: Self-pay | Admitting: *Deleted

## 2022-11-06 ENCOUNTER — Other Ambulatory Visit: Payer: Self-pay

## 2022-11-06 VITALS — BP 103/62 | HR 69 | Temp 97.9°F | Resp 16

## 2022-11-06 DIAGNOSIS — C670 Malignant neoplasm of trigone of bladder: Secondary | ICD-10-CM | POA: Diagnosis not present

## 2022-11-06 DIAGNOSIS — C679 Malignant neoplasm of bladder, unspecified: Secondary | ICD-10-CM

## 2022-11-06 DIAGNOSIS — Z95828 Presence of other vascular implants and grafts: Secondary | ICD-10-CM

## 2022-11-06 DIAGNOSIS — M545 Low back pain, unspecified: Secondary | ICD-10-CM | POA: Diagnosis not present

## 2022-11-06 DIAGNOSIS — R197 Diarrhea, unspecified: Secondary | ICD-10-CM | POA: Diagnosis not present

## 2022-11-06 DIAGNOSIS — R5383 Other fatigue: Secondary | ICD-10-CM

## 2022-11-06 LAB — CBC WITH DIFFERENTIAL (CANCER CENTER ONLY)
Abs Immature Granulocytes: 0.5 10*3/uL — ABNORMAL HIGH (ref 0.00–0.07)
Basophils Absolute: 0.1 10*3/uL (ref 0.0–0.1)
Basophils Relative: 1 %
Eosinophils Absolute: 0.2 10*3/uL (ref 0.0–0.5)
Eosinophils Relative: 1 %
HCT: 41.6 % (ref 39.0–52.0)
Hemoglobin: 14.2 g/dL (ref 13.0–17.0)
Immature Granulocytes: 4 %
Lymphocytes Relative: 11 %
Lymphs Abs: 1.4 10*3/uL (ref 0.7–4.0)
MCH: 29.5 pg (ref 26.0–34.0)
MCHC: 34.1 g/dL (ref 30.0–36.0)
MCV: 86.5 fL (ref 80.0–100.0)
Monocytes Absolute: 0.6 10*3/uL (ref 0.1–1.0)
Monocytes Relative: 5 %
Neutro Abs: 9.9 10*3/uL — ABNORMAL HIGH (ref 1.7–7.7)
Neutrophils Relative %: 78 %
Platelet Count: 107 10*3/uL — ABNORMAL LOW (ref 150–400)
RBC: 4.81 MIL/uL (ref 4.22–5.81)
RDW: 16.8 % — ABNORMAL HIGH (ref 11.5–15.5)
WBC Count: 12.7 10*3/uL — ABNORMAL HIGH (ref 4.0–10.5)
nRBC: 0 % (ref 0.0–0.2)

## 2022-11-06 LAB — CMP (CANCER CENTER ONLY)
ALT: 25 U/L (ref 0–44)
AST: 49 U/L — ABNORMAL HIGH (ref 15–41)
Albumin: 3.4 g/dL — ABNORMAL LOW (ref 3.5–5.0)
Alkaline Phosphatase: 551 U/L — ABNORMAL HIGH (ref 38–126)
Anion gap: 7 (ref 5–15)
BUN: 35 mg/dL — ABNORMAL HIGH (ref 6–20)
CO2: 22 mmol/L (ref 22–32)
Calcium: 8.4 mg/dL — ABNORMAL LOW (ref 8.9–10.3)
Chloride: 105 mmol/L (ref 98–111)
Creatinine: 1.36 mg/dL — ABNORMAL HIGH (ref 0.61–1.24)
GFR, Estimated: 60 mL/min — ABNORMAL LOW (ref >60)
Glucose, Bld: 114 mg/dL — ABNORMAL HIGH (ref 70–99)
Potassium: 4.1 mmol/L (ref 3.5–5.1)
Sodium: 134 mmol/L — ABNORMAL LOW (ref 135–145)
Total Bilirubin: 0.4 mg/dL (ref 0.3–1.2)
Total Protein: 6.2 g/dL — ABNORMAL LOW (ref 6.5–8.1)

## 2022-11-06 MED ORDER — HYDROCODONE-ACETAMINOPHEN 5-325 MG PO TABS: 1.0000 | ORAL_TABLET | Freq: Four times a day (QID) | ORAL | 0 refills | Status: AC | PRN

## 2022-11-06 MED ORDER — SODIUM CHLORIDE 0.9 % IV SOLN: Freq: Once | INTRAVENOUS | Status: AC

## 2022-11-06 MED ORDER — HEPARIN SOD (PORK) LOCK FLUSH 100 UNIT/ML IV SOLN
500.0000 [IU] | Freq: Once | INTRAVENOUS | Status: AC
  Administered 2022-11-06: 500 [IU]

## 2022-11-06 MED ORDER — SODIUM CHLORIDE 0.9% FLUSH
10.0000 mL | Freq: Once | INTRAVENOUS | Status: AC
  Administered 2022-11-06: 10 mL

## 2022-11-06 NOTE — Telephone Encounter (Signed)
Pt is going to come in at 1230 to our [facility]

## 2022-11-06 NOTE — Telephone Encounter (Signed)
Barnett Applebaum says Barry Gibson had to go to the ED 12/17. They placed a foley due to ostomy not working. Foley fell out Monday and ostomy is now working. Barry Gibson was placed on steroids, finished Monday. Is still taking Cipro X 10 days.  Since Monday is extremely weak since completing steroids. Is having extreme back pain. Diarrhea started on Tuesday.  Please advise.

## 2022-11-06 NOTE — Progress Notes (Signed)
Symptom Management Consult note [facility]    Patient Care Team: Wyatt Portela, MD as PCP - General (Oncology)    Name of the patient: Barry Gibson  [id number]  1961-04-25   Date of visit: 11/06/2022   Chief Complaint/Reason for visit: weakness, diarrhea, back pain   Current Therapy: Padcev  Last treatment:  Day 1   Cycle 2 on 11/01/22   ASSESSMENT & PLAN: Patient is a 61 y.o. 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  followed by Dr. Alen Blew.  I have viewed most recent oncology and ED notes as well as lab work.    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  - Next appointment with oncologist is not yet scheduled. Patient will be transferring care to different provider in the new year. Scheduling message request has already been sent by Dr. Alen Blew.  -Recently had chemo dose reduced and schedule altered due to complications. -Due for restaging scan in January. -Discussed with Dr. Alen Blew who agrees for IVF for supportive care during off week.  #)Diarrhea -Patient with diarrhea x 2 days. Benign abdominal exam. -Patient given liter of IVF for hydration in clinic. Diarrhea not bad enough for Imodium per patient. -He is unable to provide stool sample here for testing. He has taken cipro in the past without complication, low suspicion for C diff although if he continues to have diarrhea I recommended he return for stool testing. -CMP without significant electrolyte derangement. Kidney function improved from x 3 days ago.  #) UTI -Prescribed cipro at recent ED visit. Micro reviewed and suggested recollection because of multiple species.  -Patient plans to finish antibiotic course as he has had frequent infections in the past. Discussed risk vs benefit of this.  #)Back pain -Likely cancer related. No red flags  and normal neuro exam. - I have reviewed the PDMP during this encounter. He has no recent or current chronic prescription.  I will send a short course of Norco for severe pain not relieved with Tylenol. Max dose of tylenol of 4g daily. -If pain medication helps I encourage patient to reach out to palliative care team to establish care and further discussion on pain management.  Strict ED precautions discussed should symptoms worsen.   Heme/Onc History: Oncology History  Bladder cancer ([place])  03/06/2020 Initial Diagnosis   Bladder cancer ([place])   05/18/2021 Cancer Staging   Staging form: Urinary Bladder, AJCC 8th Edition - Clinical: Stage IVB (cT4, cN2, cM1b) - Signed by Wyatt Portela, MD on 05/18/2021 WHO/ISUP grade (low/high): High Grade Histologic grading system: 2 grade system   06/08/2021 - 08/17/2021 Chemotherapy   Patient is on Treatment Plan : BLADDER Cisplatin D1 + Gemcitabine D1,8 q21d x 6 Cycles     09/07/2021 - 06/07/2022 Chemotherapy   Patient is on Treatment Plan : HEAD/NECK Pembrolizumab      09/07/2021 - 07/19/2022 Chemotherapy   Patient is on Treatment Plan : ANUS Pembrolizumab (400) q42d     09/27/2022 -  Chemotherapy   Patient is on Treatment Plan : UROTHELIAL LOCALLY ADVANCED, METASTATIC Enfortumab D1,8,15 q28d         Interval history-: Barry Gibson is a 61 y.o. male with oncologic history as above presenting to [facility] today with chief complaint of generalized weakness, diarrhea and back pain.  His spouse accompanies him today and provides additional  history.  Patient seen in the ED recently for decreased output from ileal conduit.  That seems to be improved and he has normal urine output now.  He is taking Cipro as UA was concerning for UTI at that visit.  Patient states he has had diarrhea x 2 day.  He had 4 episodes of loose stool prior to arrival today.  He describes stool as liquid and brown in color.  He denies foul odor.  Denies any sick contacts, suspicious  food or recent travel.  He has Imodium at home although did not take it for his diarrhea yet. Patient states since his last treatment he has had good appetite while taking the dexamethasone.  He has not had any nausea or vomiting.  He does endorse continued low back pain.  This has been ongoing for several months. At first he thought it was from lifting a heavy trash can at work.  He takes Tylenol for it with some relief as well as using heating pad.  He describes the pain as a soreness.  It does not radiate.  He denies any weakness or numbness in his legs.      ROS  All other systems are reviewed and are negative for acute change except as noted in the HPI.    Allergies  Allergen Reactions   Ace Inhibitors Cough   Diflucan [Fluconazole] Other (See Comments)    Irritated ulcers     Past Medical History:  Diagnosis Date   Cancer ([place])    bladder   Cataract    COVID-19 11/13/2019   h/a, chills, fatigue, loss of taste and smell, all symptoms resolved in a few weeks   Frequent urination    Fuchs' corneal dystrophy    Full dentures    GERD (gastroesophageal reflux disease)    Hypertension    Rheumatoid arthritis ([place])      Past Surgical History:  Procedure Laterality Date   CORNEAL TRANSPLANT Left 2013   CYSTOSCOPY WITH BIOPSY N/A 09/14/2019   Procedure: CYSTOSCOPY WITH BIOPSY;  Surgeon: Franchot Gallo, MD;  Location: [facility];  Service: Urology;  Laterality: N/A;   CYSTOSCOPY WITH INJECTION N/A 11/29/2020   Procedure: CYSTOSCOPY WITH INJECTION OF INDOCYANINE GREEN DYE;  Surgeon: Alexis Frock, MD;  Location: [facility];  Service: Urology;  Laterality: N/A;  6 HRS   FULGURATION OF BLADDER TUMOR N/A 09/14/2019   Procedure: FULGURATION OF BLADDER TUMOR;  Surgeon: Franchot Gallo, MD;  Location: [facility];  Service: Urology;  Laterality: N/A;   IR IMAGING GUIDED PORT INSERTION  06/07/2021   IR NEPHROSTOMY EXCHANGE LEFT  10/30/2020   IR NEPHROSTOMY EXCHANGE LEFT  11/19/2020   IR  NEPHROSTOMY EXCHANGE RIGHT  10/30/2020   IR NEPHROSTOMY EXCHANGE RIGHT  11/19/2020   IR NEPHROSTOMY PLACEMENT LEFT  09/27/2020   IR NEPHROSTOMY PLACEMENT RIGHT  09/27/2020   ROBOT ASSISTED LAPAROSCOPIC COMPLETE CYSTECT ILEAL CONDUIT N/A 11/29/2020   Procedure: XI ROBOTIC ASSISTED LAPAROSCOPIC COMPLETE CYSTECT ILEAL CONDUIT/RADICAL PROSTATECTOMY/LYMPHADENECTOMY;  Surgeon: Alexis Frock, MD;  Location: [facility];  Service: Urology;  Laterality: N/A;   TRANSURETHRAL RESECTION OF BLADDER TUMOR WITH MITOMYCIN-C N/A 08/05/2019   Procedure: TRANSURETHRAL RESECTION OF BLADDER TUMOR;  Surgeon: Franchot Gallo, MD;  Location: [facility];  Service: Urology;  Laterality: N/A;  1 HR   TRANSURETHRAL RESECTION OF BLADDER TUMOR WITH MITOMYCIN-C N/A 04/24/2020   Procedure: TRANSURETHRAL RESECTION OF BLADDER TUMOR WITH GEMCIDABINE;  Surgeon: Franchot Gallo, MD;  Location: [facility];  Service: Urology;  Laterality: N/A;    Social History   Socioeconomic History   Marital status: Married    Spouse name: Not on file   Number of children: Not on file   Years of education: Not on file   Highest education level: Not on file  Occupational History   Not on file  Tobacco Use   Smoking status: Former    Packs/day: 0.50    Years: 20.00    Total pack years: 10.00    Types: Cigarettes   Smokeless tobacco: Never   Tobacco comments:    quit 2010  Vaping Use   Vaping Use: Never used  Substance and Sexual Activity   Alcohol use: Yes    Comment: occ  2 x month   Drug use: No   Sexual activity: Yes  Other Topics Concern   Not on file  Social History Narrative   Not on file   Social Determinants of Health   Financial Resource Strain: Not on file  Food Insecurity: Not on file  Transportation Needs: Not on file  Physical Activity: Not on file  Stress: Not on file  Social Connections: Not on file  Intimate Partner Violence: Not on file    No family history on  file.   Current Outpatient Medications:    HYDROcodone-acetaminophen (NORCO) 5-325 MG tablet, Take 1 tablet by mouth every 6 (six) hours as needed for up to 7 days for moderate pain., Disp: 28 tablet, Rfl: 0   acetaminophen (TYLENOL) 650 MG CR tablet, Take 1,300 mg by mouth every morning., Disp: , Rfl:    amLODipine (NORVASC) 5 MG tablet, Take 1 tablet (5 mg total) by mouth daily., Disp: 90 tablet, Rfl: 3   ciprofloxacin (CIPRO) 500 MG tablet, Take 1 tablet (500 mg total) by mouth every 12 (twelve) hours for 10 days., Disp: 20 tablet, Rfl: 0   dexamethasone (DECADRON) 4 MG tablet, Take 1 tablet (4 mg total) by mouth 2 (two) times daily. Take for 3 days after each chemotherapy treatment, Disp: 24 tablet, Rfl: 3   hydroxychloroquine (PLAQUENIL) 200 MG tablet, Take 200 mg by mouth 2 (two) times daily., Disp: , Rfl:    losartan (COZAAR) 25 MG tablet, Take 1 tablet (25 mg total) by mouth daily., Disp: 90 tablet, Rfl: 3   Multiple Vitamin (MULTIVITAMIN WITH MINERALS) TABS tablet, Take 1 tablet by mouth daily., Disp: , Rfl:    omeprazole (PRILOSEC) 40 MG capsule, Take 40 mg by mouth daily., Disp: , Rfl:    ondansetron (ZOFRAN-ODT) 4 MG disintegrating tablet, Take 1 tablet (4 mg total) by mouth every 8 (eight) hours as needed for nausea or vomiting., Disp: 20 tablet, Rfl: 1   prednisoLONE acetate (PRED FORTE) 1 % ophthalmic suspension, Place 1 drop into the left eye 2 (two) times a week. Instill 1 drop into left eye Monday and Friday., Disp: , Rfl:    promethazine (PHENERGAN) 25 MG tablet, Take 1 tablet (25 mg total) by mouth every 6 (six) hours as needed for nausea or vomiting., Disp: 30 tablet, Rfl: 0   rosuvastatin (CRESTOR) 20 MG tablet, TAKE 1 TABLET(20 MG) BY MOUTH DAILY (Patient taking differently: Take 20 mg by mouth daily.), Disp: 90 tablet, Rfl: 1   sertraline (ZOLOFT) 100 MG tablet, Take 1 tablet (100 mg total) by mouth daily., Disp: 90 tablet, Rfl: 3 No current facility-administered medications  for this visit.  Facility-Administered Medications Ordered in Other Visits:    acetaminophen (TYLENOL) 325 MG tablet, , , ,  diphenhydrAMINE (BENADRYL) 25 mg capsule, , , ,    gemcitabine (GEMZAR) chemo syringe for bladder instillation 2,000 mg, 2,000 mg, Bladder Instillation, Once, Franchot Gallo, MD   gemcitabine [facility]) chemo syringe for bladder instillation 2,000 mg, 2,000 mg, Bladder Instillation, Once, Dahlstedt, Stephen, MD  PHYSICAL EXAM: ECOG FS:1 - Symptomatic but completely ambulatory    Vitals:   11/06/22 1303  BP: 103/62  Pulse: 69  Resp: 16  Temp: 97.9 F (36.6 C)  TempSrc: Oral  SpO2: 99%   Physical Exam Vitals and nursing note reviewed.  Constitutional:      Appearance: He is well-developed. He is not ill-appearing or toxic-appearing.  HENT:     Head: Normocephalic.     Nose: Nose normal.     Mouth/Throat:     Mouth: Mucous membranes are dry.  Eyes:     Conjunctiva/sclera: Conjunctivae normal.  Neck:     Vascular: No JVD.  Cardiovascular:     Rate and Rhythm: Normal rate and regular rhythm.     Pulses: Normal pulses.     Heart sounds: Normal heart sounds.  Pulmonary:     Effort: Pulmonary effort is normal.     Breath sounds: Normal breath sounds.  Abdominal:     General: Bowel sounds are normal. There is no distension.     Palpations: Abdomen is soft. There is no mass.     Tenderness: There is no abdominal tenderness. There is no right CVA tenderness, left CVA tenderness, guarding or rebound.     Hernia: No hernia is present.     Comments: Urostomy bag with light-colored yellow urine   Musculoskeletal:     Cervical back: Normal range of motion.     Right lower leg: No edema.     Left lower leg: No edema.  Skin:    General: Skin is warm and dry.  Neurological:     Mental Status: He is oriented to person, place, and time.     Comments: Sensation grossly intact to light touch in the lower extremities bilaterally. No saddle  anesthesias. Strength 5/5 with flexion and extension at the bilateral hips, knees, and ankles. No noted gait deficit. Coordination intact with heel to shin testing.          LABORATORY DATA: I have reviewed the data as listed    Latest Ref Rng & Units 11/06/2022    1:07 PM 11/03/2022    3:56 AM 11/01/2022    1:44 PM  CBC  WBC 4.0 - 10.5 K/uL 12.7  15.8  12.6   Hemoglobin 13.0 - 17.0 g/dL 14.2  14.4  13.0   Hematocrit 39.0 - 52.0 % 41.6  43.7  37.9   Platelets 150 - 400 K/uL 107  161  169         Latest Ref Rng & Units 11/06/2022    1:07 PM 11/03/2022    3:56 AM 11/01/2022    1:44 PM  CMP  Glucose 70 - 99 mg/dL 114  133  105   BUN 6 - 20 mg/dL 35  36  23   Creatinine 0.61 - 1.24 mg/dL 1.36  1.67  1.10   Sodium 135 - 145 mmol/L 134  135  136   Potassium 3.5 - 5.1 mmol/L 4.1  4.4  3.9   Chloride 98 - 111 mmol/L 105  102  105   CO2 22 - 32 mmol/L '22  23  25   '$ Calcium 8.9 - 10.3 mg/dL 8.4  8.2  8.4   Total Protein 6.5 - 8.1 g/dL 6.2   5.8   Total Bilirubin 0.3 - 1.2 mg/dL 0.4   0.3   Alkaline Phos 38 - 126 U/L 551   830   AST 15 - 41 U/L 49   37   ALT 0 - 44 U/L 25   28        RADIOGRAPHIC STUDIES (from last 24 hours if applicable) I have personally reviewed the radiological images as listed and agreed with the findings in the report. No results found.      Visit Diagnosis: 1. Malignant neoplasm of urinary bladder, unspecified site ([place])   2. Bilateral low back pain without sciatica, unspecified chronicity   3. Diarrhea, unspecified type      No orders of the defined types were placed in this encounter.   All questions were answered. The patient knows to call the clinic with any problems, questions or concerns. No barriers to learning was detected.  I have spent a total of 30 minutes minutes of face-to-face and non-face-to-face time, preparing to see the patient, obtaining and/or reviewing separately obtained history, performing a medically appropriate  examination, counseling and educating the patient, ordering tests, documenting clinical information in the electronic health record, and care coordination (communications with other health care professionals or caregivers).    Thank you for allowing me to participate in the care of this patient.    Barrie Folk, PA-C Department of Hematology/Oncology [facility] at [facility] Phone: [phone]  [phone]    11/06/2022 5:02 PM

## 2022-11-07 ENCOUNTER — Other Ambulatory Visit: Payer: Self-pay | Admitting: Oncology

## 2022-11-07 DIAGNOSIS — C679 Malignant neoplasm of bladder, unspecified: Secondary | ICD-10-CM

## 2022-11-08 ENCOUNTER — Other Ambulatory Visit: Payer: BC Managed Care – PPO

## 2022-11-08 ENCOUNTER — Ambulatory Visit: Payer: BC Managed Care – PPO

## 2022-11-14 ENCOUNTER — Encounter: Payer: Self-pay | Admitting: Physician Assistant

## 2022-11-14 ENCOUNTER — Telehealth: Payer: Self-pay | Admitting: Oncology

## 2022-11-14 NOTE — Telephone Encounter (Signed)
Called patient regarding upcoming December and January appointments. Patient is notified.  

## 2022-11-15 ENCOUNTER — Inpatient Hospital Stay: Payer: BC Managed Care – PPO

## 2022-11-15 ENCOUNTER — Other Ambulatory Visit: Payer: Self-pay

## 2022-11-15 VITALS — Wt 190.5 lb

## 2022-11-15 DIAGNOSIS — C670 Malignant neoplasm of trigone of bladder: Secondary | ICD-10-CM | POA: Diagnosis not present

## 2022-11-15 DIAGNOSIS — Z95828 Presence of other vascular implants and grafts: Secondary | ICD-10-CM

## 2022-11-15 DIAGNOSIS — C679 Malignant neoplasm of bladder, unspecified: Secondary | ICD-10-CM

## 2022-11-15 LAB — CMP (CANCER CENTER ONLY)
ALT: 13 U/L (ref 0–44)
AST: 47 U/L — ABNORMAL HIGH (ref 15–41)
Albumin: 3.3 g/dL — ABNORMAL LOW (ref 3.5–5.0)
Alkaline Phosphatase: 649 U/L — ABNORMAL HIGH (ref 38–126)
Anion gap: 7 (ref 5–15)
BUN: 24 mg/dL — ABNORMAL HIGH (ref 6–20)
CO2: 23 mmol/L (ref 22–32)
Calcium: 8.5 mg/dL — ABNORMAL LOW (ref 8.9–10.3)
Chloride: 107 mmol/L (ref 98–111)
Creatinine: 1.08 mg/dL (ref 0.61–1.24)
GFR, Estimated: >60 mL/min (ref >60)
Glucose, Bld: 109 mg/dL — ABNORMAL HIGH (ref 70–99)
Potassium: 3.8 mmol/L (ref 3.5–5.1)
Sodium: 137 mmol/L (ref 135–145)
Total Bilirubin: 0.5 mg/dL (ref 0.3–1.2)
Total Protein: 6.1 g/dL — ABNORMAL LOW (ref 6.5–8.1)

## 2022-11-15 LAB — CBC WITH DIFFERENTIAL (CANCER CENTER ONLY)
Abs Immature Granulocytes: 0.13 10*3/uL — ABNORMAL HIGH (ref 0.00–0.07)
Basophils Absolute: 0 10*3/uL (ref 0.0–0.1)
Basophils Relative: 0 %
Eosinophils Absolute: 0.1 10*3/uL (ref 0.0–0.5)
Eosinophils Relative: 2 %
HCT: 37.6 % — ABNORMAL LOW (ref 39.0–52.0)
Hemoglobin: 12.6 g/dL — ABNORMAL LOW (ref 13.0–17.0)
Immature Granulocytes: 2 %
Lymphocytes Relative: 23 %
Lymphs Abs: 1.6 10*3/uL (ref 0.7–4.0)
MCH: 29.4 pg (ref 26.0–34.0)
MCHC: 33.5 g/dL (ref 30.0–36.0)
MCV: 87.6 fL (ref 80.0–100.0)
Monocytes Absolute: 0.2 10*3/uL (ref 0.1–1.0)
Monocytes Relative: 3 %
Neutro Abs: 4.7 10*3/uL (ref 1.7–7.7)
Neutrophils Relative %: 70 %
Platelet Count: 149 10*3/uL — ABNORMAL LOW (ref 150–400)
RBC: 4.29 MIL/uL (ref 4.22–5.81)
RDW: 16.8 % — ABNORMAL HIGH (ref 11.5–15.5)
WBC Count: 6.8 10*3/uL (ref 4.0–10.5)
nRBC: 0 % (ref 0.0–0.2)

## 2022-11-15 MED ORDER — SODIUM CHLORIDE 0.9% FLUSH
10.0000 mL | INTRAVENOUS | Status: DC | PRN
  Administered 2022-11-15: 10 mL

## 2022-11-15 MED ORDER — SODIUM CHLORIDE 0.9 % IV SOLN: Freq: Once | INTRAVENOUS | Status: AC

## 2022-11-15 MED ORDER — PROCHLORPERAZINE MALEATE 10 MG PO TABS
10.0000 mg | ORAL_TABLET | Freq: Once | ORAL | Status: AC
  Administered 2022-11-15: 10 mg via ORAL
  Filled 2022-11-15: qty 1

## 2022-11-15 MED ORDER — SODIUM CHLORIDE 0.9 % IV SOLN: 0.5000 mg/kg | Freq: Once | INTRAVENOUS | Status: DC

## 2022-11-15 MED ORDER — SODIUM CHLORIDE 0.9 % IV SOLN
0.5300 mg/kg | Freq: Once | INTRAVENOUS | Status: AC
  Administered 2022-11-15: 50 mg via INTRAVENOUS
  Filled 2022-11-15: qty 5

## 2022-11-15 MED ORDER — SODIUM CHLORIDE 0.9% FLUSH
10.0000 mL | Freq: Once | INTRAVENOUS | Status: AC
  Administered 2022-11-15: 10 mL

## 2022-11-15 MED ORDER — HEPARIN SOD (PORK) LOCK FLUSH 100 UNIT/ML IV SOLN
500.0000 [IU] | Freq: Once | INTRAVENOUS | Status: AC | PRN
  Administered 2022-11-15: 500 [IU]

## 2022-11-15 NOTE — Patient Instructions (Signed)
[facility] ONCOLOGY  Discharge Instructions: Thank you for choosing [facility] to provide your oncology and hematology care.   If you have a lab appointment with the [facility], please go directly to the [facility] and check in at the registration area.   Wear comfortable clothing and clothing appropriate for easy access to any Portacath or PICC line.   We strive to give you quality time with your provider. You may need to reschedule your appointment if you arrive late (15 or more minutes).  Arriving late affects you and other patients whose appointments are after yours.  Also, if you miss three or more appointments without notifying the office, you may be dismissed from the clinic at the provider's discretion.      For prescription refill requests, have your pharmacy contact our office and allow 72 hours for refills to be completed.    Today you received the following chemotherapy and/or immunotherapy agents Padcev      To help prevent nausea and vomiting after your treatment, we encourage you to take your nausea medication as directed.  BELOW ARE SYMPTOMS THAT SHOULD BE REPORTED IMMEDIATELY: *FEVER GREATER THAN 100.4 F (38 C) OR HIGHER *CHILLS OR SWEATING *NAUSEA AND VOMITING THAT IS NOT CONTROLLED WITH YOUR NAUSEA MEDICATION *UNUSUAL SHORTNESS OF BREATH *UNUSUAL BRUISING OR BLEEDING *URINARY PROBLEMS (pain or burning when urinating, or frequent urination) *BOWEL PROBLEMS (unusual diarrhea, constipation, pain near the anus) TENDERNESS IN MOUTH AND THROAT WITH OR WITHOUT PRESENCE OF ULCERS (sore throat, sores in mouth, or a toothache) UNUSUAL RASH, SWELLING OR PAIN  UNUSUAL VAGINAL DISCHARGE OR ITCHING   Items with * indicate a potential emergency and should be followed up as soon as possible or go to the Emergency Department if any problems should occur.  Please show the CHEMOTHERAPY ALERT CARD or IMMUNOTHERAPY ALERT CARD at check-in to the  Emergency Department and triage nurse.  Should you have questions after your visit or need to cancel or reschedule your appointment, please contact [facility]  Dept: [phone]  and follow the prompts.  Office hours are 8:00 a.m. to 4:30 p.m. Monday - Friday. Please note that voicemails left after 4:00 p.m. may not be returned until the following business day.  We are closed weekends and major holidays. You have access to a nurse at all times for urgent questions. Please call the main number to the clinic Dept: [phone] and follow the prompts.   For any non-urgent questions, you may also contact your provider using MyChart. We now offer e-Visits for anyone 57 and older to request care online for non-urgent symptoms. For details visit [email or web address].   Also download the MyChart app! Go to the app store, search "MyChart", open the app, select [facility], and log in with your MyChart username and password.

## 2022-11-15 NOTE — Progress Notes (Signed)
OK to round Padcev dose to 50 mg to avoid waste of vial per Dr Alen Blew.  Kennith Center, Pharm.D., CPP 11/15/2022'@3'$ :23 PM

## 2022-11-21 ENCOUNTER — Inpatient Hospital Stay: Payer: BC Managed Care – PPO

## 2022-11-21 ENCOUNTER — Other Ambulatory Visit: Payer: Self-pay

## 2022-11-21 ENCOUNTER — Ambulatory Visit (HOSPITAL_COMMUNITY)
Admission: RE | Admit: 2022-11-21 | Discharge: 2022-11-21 | Disposition: A | Discharge status: Home / Self Care | Payer: BC Managed Care – PPO | Source: Ambulatory Visit | Attending: Oncology | Admitting: Oncology

## 2022-11-21 VITALS — BP 121/80 | HR 80 | Temp 98.1°F | Resp 16 | Ht 71.0 in | Wt 190.8 lb

## 2022-11-21 DIAGNOSIS — Z95828 Presence of other vascular implants and grafts: Secondary | ICD-10-CM

## 2022-11-21 DIAGNOSIS — K8020 Calculus of gallbladder without cholecystitis without obstruction: Secondary | ICD-10-CM | POA: Insufficient documentation

## 2022-11-21 DIAGNOSIS — C786 Secondary malignant neoplasm of retroperitoneum and peritoneum: Secondary | ICD-10-CM | POA: Insufficient documentation

## 2022-11-21 DIAGNOSIS — Z87891 Personal history of nicotine dependence: Secondary | ICD-10-CM | POA: Insufficient documentation

## 2022-11-21 DIAGNOSIS — Z883 Allergy status to other anti-infective agents status: Secondary | ICD-10-CM | POA: Insufficient documentation

## 2022-11-21 DIAGNOSIS — J439 Emphysema, unspecified: Secondary | ICD-10-CM | POA: Insufficient documentation

## 2022-11-21 DIAGNOSIS — M545 Low back pain, unspecified: Secondary | ICD-10-CM | POA: Insufficient documentation

## 2022-11-21 DIAGNOSIS — I700 Atherosclerosis of aorta: Secondary | ICD-10-CM | POA: Insufficient documentation

## 2022-11-21 DIAGNOSIS — Z79899 Other long term (current) drug therapy: Secondary | ICD-10-CM | POA: Insufficient documentation

## 2022-11-21 DIAGNOSIS — C670 Malignant neoplasm of trigone of bladder: Secondary | ICD-10-CM | POA: Insufficient documentation

## 2022-11-21 DIAGNOSIS — K5900 Constipation, unspecified: Secondary | ICD-10-CM | POA: Insufficient documentation

## 2022-11-21 DIAGNOSIS — Z515 Encounter for palliative care: Secondary | ICD-10-CM | POA: Insufficient documentation

## 2022-11-21 DIAGNOSIS — R630 Anorexia: Secondary | ICD-10-CM | POA: Insufficient documentation

## 2022-11-21 DIAGNOSIS — Z5112 Encounter for antineoplastic immunotherapy: Secondary | ICD-10-CM | POA: Insufficient documentation

## 2022-11-21 DIAGNOSIS — C7951 Secondary malignant neoplasm of bone: Secondary | ICD-10-CM | POA: Insufficient documentation

## 2022-11-21 DIAGNOSIS — I2510 Atherosclerotic heart disease of native coronary artery without angina pectoris: Secondary | ICD-10-CM | POA: Insufficient documentation

## 2022-11-21 DIAGNOSIS — Z87440 Personal history of urinary (tract) infections: Secondary | ICD-10-CM | POA: Insufficient documentation

## 2022-11-21 DIAGNOSIS — R634 Abnormal weight loss: Secondary | ICD-10-CM | POA: Insufficient documentation

## 2022-11-21 DIAGNOSIS — R627 Adult failure to thrive: Secondary | ICD-10-CM | POA: Insufficient documentation

## 2022-11-21 DIAGNOSIS — Z7952 Long term (current) use of systemic steroids: Secondary | ICD-10-CM | POA: Insufficient documentation

## 2022-11-21 DIAGNOSIS — F32A Depression, unspecified: Secondary | ICD-10-CM | POA: Insufficient documentation

## 2022-11-21 DIAGNOSIS — C679 Malignant neoplasm of bladder, unspecified: Secondary | ICD-10-CM | POA: Diagnosis present

## 2022-11-21 DIAGNOSIS — I10 Essential (primary) hypertension: Secondary | ICD-10-CM | POA: Insufficient documentation

## 2022-11-21 DIAGNOSIS — C778 Secondary and unspecified malignant neoplasm of lymph nodes of multiple regions: Secondary | ICD-10-CM | POA: Insufficient documentation

## 2022-11-21 DIAGNOSIS — Z510 Encounter for antineoplastic radiation therapy: Secondary | ICD-10-CM | POA: Insufficient documentation

## 2022-11-21 DIAGNOSIS — F419 Anxiety disorder, unspecified: Secondary | ICD-10-CM | POA: Insufficient documentation

## 2022-11-21 DIAGNOSIS — G893 Neoplasm related pain (acute) (chronic): Secondary | ICD-10-CM | POA: Insufficient documentation

## 2022-11-21 DIAGNOSIS — I959 Hypotension, unspecified: Secondary | ICD-10-CM | POA: Insufficient documentation

## 2022-11-21 DIAGNOSIS — K439 Ventral hernia without obstruction or gangrene: Secondary | ICD-10-CM | POA: Insufficient documentation

## 2022-11-21 DIAGNOSIS — E860 Dehydration: Secondary | ICD-10-CM | POA: Insufficient documentation

## 2022-11-21 DIAGNOSIS — N179 Acute kidney failure, unspecified: Secondary | ICD-10-CM | POA: Insufficient documentation

## 2022-11-21 MED ORDER — SODIUM CHLORIDE 0.9 % IV SOLN: Freq: Once | INTRAVENOUS | Status: AC

## 2022-11-21 MED ORDER — SODIUM CHLORIDE 0.9% FLUSH
10.0000 mL | Freq: Once | INTRAVENOUS | Status: AC
  Administered 2022-11-21: 10 mL

## 2022-11-21 MED ORDER — IOHEXOL 300 MG/ML  SOLN
100.0000 mL | Freq: Once | INTRAMUSCULAR | Status: AC | PRN
  Administered 2022-11-21: 100 mL via INTRAVENOUS

## 2022-11-21 MED ORDER — SODIUM CHLORIDE (PF) 0.9 % IJ SOLN
INTRAMUSCULAR | Status: AC
  Filled 2022-11-21: qty 50

## 2022-11-21 MED ORDER — HEPARIN SOD (PORK) LOCK FLUSH 100 UNIT/ML IV SOLN
500.0000 [IU] | Freq: Once | INTRAVENOUS | Status: AC
  Administered 2022-11-21: 500 [IU]

## 2022-11-29 ENCOUNTER — Inpatient Hospital Stay: Payer: BC Managed Care – PPO

## 2022-11-29 ENCOUNTER — Other Ambulatory Visit: Payer: Self-pay

## 2022-11-29 ENCOUNTER — Other Ambulatory Visit: Payer: Self-pay | Admitting: Family Medicine

## 2022-11-29 ENCOUNTER — Inpatient Hospital Stay (HOSPITAL_BASED_OUTPATIENT_CLINIC_OR_DEPARTMENT_OTHER): Payer: BC Managed Care – PPO | Admitting: Oncology

## 2022-11-29 VITALS — BP 101/70 | HR 84 | Temp 98.1°F | Resp 17 | Ht 71.0 in | Wt 188.4 lb

## 2022-11-29 DIAGNOSIS — M545 Low back pain, unspecified: Secondary | ICD-10-CM | POA: Diagnosis not present

## 2022-11-29 DIAGNOSIS — C786 Secondary malignant neoplasm of retroperitoneum and peritoneum: Secondary | ICD-10-CM | POA: Diagnosis not present

## 2022-11-29 DIAGNOSIS — C679 Malignant neoplasm of bladder, unspecified: Secondary | ICD-10-CM

## 2022-11-29 DIAGNOSIS — K8020 Calculus of gallbladder without cholecystitis without obstruction: Secondary | ICD-10-CM | POA: Diagnosis not present

## 2022-11-29 DIAGNOSIS — Z79899 Other long term (current) drug therapy: Secondary | ICD-10-CM | POA: Diagnosis not present

## 2022-11-29 DIAGNOSIS — C7951 Secondary malignant neoplasm of bone: Secondary | ICD-10-CM | POA: Diagnosis not present

## 2022-11-29 DIAGNOSIS — F419 Anxiety disorder, unspecified: Secondary | ICD-10-CM | POA: Diagnosis not present

## 2022-11-29 DIAGNOSIS — E860 Dehydration: Secondary | ICD-10-CM | POA: Diagnosis not present

## 2022-11-29 DIAGNOSIS — R627 Adult failure to thrive: Secondary | ICD-10-CM | POA: Diagnosis not present

## 2022-11-29 DIAGNOSIS — I2510 Atherosclerotic heart disease of native coronary artery without angina pectoris: Secondary | ICD-10-CM | POA: Diagnosis not present

## 2022-11-29 DIAGNOSIS — K5900 Constipation, unspecified: Secondary | ICD-10-CM | POA: Diagnosis not present

## 2022-11-29 DIAGNOSIS — C670 Malignant neoplasm of trigone of bladder: Secondary | ICD-10-CM | POA: Diagnosis present

## 2022-11-29 DIAGNOSIS — C778 Secondary and unspecified malignant neoplasm of lymph nodes of multiple regions: Secondary | ICD-10-CM | POA: Diagnosis not present

## 2022-11-29 DIAGNOSIS — I700 Atherosclerosis of aorta: Secondary | ICD-10-CM | POA: Diagnosis not present

## 2022-11-29 DIAGNOSIS — Z95828 Presence of other vascular implants and grafts: Secondary | ICD-10-CM

## 2022-11-29 DIAGNOSIS — R634 Abnormal weight loss: Secondary | ICD-10-CM | POA: Diagnosis not present

## 2022-11-29 DIAGNOSIS — J439 Emphysema, unspecified: Secondary | ICD-10-CM | POA: Diagnosis not present

## 2022-11-29 DIAGNOSIS — I10 Essential (primary) hypertension: Secondary | ICD-10-CM | POA: Diagnosis not present

## 2022-11-29 DIAGNOSIS — Z510 Encounter for antineoplastic radiation therapy: Secondary | ICD-10-CM | POA: Diagnosis not present

## 2022-11-29 DIAGNOSIS — N179 Acute kidney failure, unspecified: Secondary | ICD-10-CM | POA: Diagnosis not present

## 2022-11-29 DIAGNOSIS — G893 Neoplasm related pain (acute) (chronic): Secondary | ICD-10-CM | POA: Diagnosis not present

## 2022-11-29 DIAGNOSIS — R630 Anorexia: Secondary | ICD-10-CM | POA: Diagnosis not present

## 2022-11-29 DIAGNOSIS — Z87891 Personal history of nicotine dependence: Secondary | ICD-10-CM | POA: Diagnosis not present

## 2022-11-29 DIAGNOSIS — I959 Hypotension, unspecified: Secondary | ICD-10-CM | POA: Diagnosis not present

## 2022-11-29 DIAGNOSIS — Z515 Encounter for palliative care: Secondary | ICD-10-CM | POA: Diagnosis not present

## 2022-11-29 DIAGNOSIS — Z5112 Encounter for antineoplastic immunotherapy: Secondary | ICD-10-CM | POA: Diagnosis present

## 2022-11-29 LAB — CBC WITH DIFFERENTIAL (CANCER CENTER ONLY)
Abs Immature Granulocytes: 0.1 10*3/uL — ABNORMAL HIGH (ref 0.00–0.07)
Basophils Absolute: 0.1 10*3/uL (ref 0.0–0.1)
Basophils Relative: 1 %
Eosinophils Absolute: 0.1 10*3/uL (ref 0.0–0.5)
Eosinophils Relative: 3 %
HCT: 34.3 % — ABNORMAL LOW (ref 39.0–52.0)
Hemoglobin: 11.4 g/dL — ABNORMAL LOW (ref 13.0–17.0)
Immature Granulocytes: 2 %
Lymphocytes Relative: 28 %
Lymphs Abs: 1.2 10*3/uL (ref 0.7–4.0)
MCH: 29.5 pg (ref 26.0–34.0)
MCHC: 33.2 g/dL (ref 30.0–36.0)
MCV: 88.9 fL (ref 80.0–100.0)
Monocytes Absolute: 0.3 10*3/uL (ref 0.1–1.0)
Monocytes Relative: 7 %
Neutro Abs: 2.7 10*3/uL (ref 1.7–7.7)
Neutrophils Relative %: 59 %
Platelet Count: 154 10*3/uL (ref 150–400)
RBC: 3.86 MIL/uL — ABNORMAL LOW (ref 4.22–5.81)
RDW: 16.6 % — ABNORMAL HIGH (ref 11.5–15.5)
WBC Count: 4.5 10*3/uL (ref 4.0–10.5)
nRBC: 0 % (ref 0.0–0.2)

## 2022-11-29 LAB — CMP (CANCER CENTER ONLY)
ALT: 11 U/L (ref 0–44)
AST: 43 U/L — ABNORMAL HIGH (ref 15–41)
Albumin: 3.6 g/dL (ref 3.5–5.0)
Alkaline Phosphatase: 762 U/L — ABNORMAL HIGH (ref 38–126)
Anion gap: 7 (ref 5–15)
BUN: 25 mg/dL — ABNORMAL HIGH (ref 6–20)
CO2: 24 mmol/L (ref 22–32)
Calcium: 8.9 mg/dL (ref 8.9–10.3)
Chloride: 108 mmol/L (ref 98–111)
Creatinine: 1.35 mg/dL — ABNORMAL HIGH (ref 0.61–1.24)
GFR, Estimated: >60 mL/min (ref >60)
Glucose, Bld: 85 mg/dL (ref 70–99)
Potassium: 4.1 mmol/L (ref 3.5–5.1)
Sodium: 139 mmol/L (ref 135–145)
Total Bilirubin: 0.4 mg/dL (ref 0.3–1.2)
Total Protein: 6.4 g/dL — ABNORMAL LOW (ref 6.5–8.1)

## 2022-11-29 MED ORDER — SODIUM CHLORIDE 0.9% FLUSH
10.0000 mL | INTRAVENOUS | Status: DC | PRN
  Administered 2022-11-29: 10 mL

## 2022-11-29 MED ORDER — SODIUM CHLORIDE 0.9 % IV SOLN: Freq: Once | INTRAVENOUS | Status: AC

## 2022-11-29 MED ORDER — SODIUM CHLORIDE 0.9% FLUSH
10.0000 mL | Freq: Once | INTRAVENOUS | Status: AC
  Administered 2022-11-29: 10 mL

## 2022-11-29 MED ORDER — SODIUM CHLORIDE 0.9 % IV SOLN
40.0000 mg | Freq: Once | INTRAVENOUS | Status: AC
  Administered 2022-11-29: 40 mg via INTRAVENOUS
  Filled 2022-11-29: qty 4

## 2022-11-29 MED ORDER — PROCHLORPERAZINE MALEATE 10 MG PO TABS
10.0000 mg | ORAL_TABLET | Freq: Once | ORAL | Status: AC
  Administered 2022-11-29: 10 mg via ORAL
  Filled 2022-11-29: qty 1

## 2022-11-29 MED ORDER — HEPARIN SOD (PORK) LOCK FLUSH 100 UNIT/ML IV SOLN
500.0000 [IU] | Freq: Once | INTRAVENOUS | Status: AC | PRN
  Administered 2022-11-29: 500 [IU]

## 2022-11-29 NOTE — Patient Instructions (Signed)
[facility] ONCOLOGY  Discharge Instructions: Thank you for choosing [facility] to provide your oncology and hematology care.   If you have a lab appointment with the [facility], please go directly to the [facility] and check in at the registration area.   Wear comfortable clothing and clothing appropriate for easy access to any Portacath or PICC line.   We strive to give you quality time with your provider. You may need to reschedule your appointment if you arrive late (15 or more minutes).  Arriving late affects you and other patients whose appointments are after yours.  Also, if you miss three or more appointments without notifying the office, you may be dismissed from the clinic at the provider's discretion.      For prescription refill requests, have your pharmacy contact our office and allow 72 hours for refills to be completed.    Today you received the following chemotherapy and/or immunotherapy agents Padcev      To help prevent nausea and vomiting after your treatment, we encourage you to take your nausea medication as directed.  BELOW ARE SYMPTOMS THAT SHOULD BE REPORTED IMMEDIATELY: *FEVER GREATER THAN 100.4 F (38 C) OR HIGHER *CHILLS OR SWEATING *NAUSEA AND VOMITING THAT IS NOT CONTROLLED WITH YOUR NAUSEA MEDICATION *UNUSUAL SHORTNESS OF BREATH *UNUSUAL BRUISING OR BLEEDING *URINARY PROBLEMS (pain or burning when urinating, or frequent urination) *BOWEL PROBLEMS (unusual diarrhea, constipation, pain near the anus) TENDERNESS IN MOUTH AND THROAT WITH OR WITHOUT PRESENCE OF ULCERS (sore throat, sores in mouth, or a toothache) UNUSUAL RASH, SWELLING OR PAIN  UNUSUAL VAGINAL DISCHARGE OR ITCHING   Items with * indicate a potential emergency and should be followed up as soon as possible or go to the Emergency Department if any problems should occur.  Please show the CHEMOTHERAPY ALERT CARD or IMMUNOTHERAPY ALERT CARD at check-in to the  Emergency Department and triage nurse.  Should you have questions after your visit or need to cancel or reschedule your appointment, please contact [facility]  Dept: [phone]  and follow the prompts.  Office hours are 8:00 a.m. to 4:30 p.m. Monday - Friday. Please note that voicemails left after 4:00 p.m. may not be returned until the following business day.  We are closed weekends and major holidays. You have access to a nurse at all times for urgent questions. Please call the main number to the clinic Dept: [phone] and follow the prompts.   For any non-urgent questions, you may also contact your provider using MyChart. We now offer e-Visits for anyone 33 and older to request care online for non-urgent symptoms. For details visit [email or web address].   Also download the MyChart app! Go to the app store, search "MyChart", open the app, select [place], and log in with your MyChart username and password.

## 2022-11-29 NOTE — Progress Notes (Signed)
Spoke w/ MD Alen Blew and he would like for [place] to reduce padcev dose based on the calculated dose. Dose changed to 40 mg (~0.5 mg/kg)  Larene Beach, PharmD

## 2022-11-29 NOTE — Progress Notes (Signed)
Hematology and Oncology Follow Up Visit  Barry Gibson [id number] 31-May-1961 62 y.o. 11/29/2022 1:52 PM Wyatt Portela, MDShadad, [name], MD   Principle Diagnosis: 44 year old man with bladder cancer diagnosed in January 2022.  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   Prior Therapy:   He underwent robotic assisted laparoscopic radical cystectomy and lymphadenectomy January 2022.  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  He had 4 out of 7 lymph nodes involved with the final pathological staging is T4N2 disease.     He underwent staging scans on April 19, 2021 which showed interval progression of abdominal pelvic adenopathy consistent with worsening nodal metastasis.    Chemotherapy utilizing gemcitabine and cisplatin started on June 08, 2021 receiving day 1 cisplatin and gemcitabine with day 8 gemcitabine alone.  He completed cycle 4 on August 17, 2021.     Pembrolizumab 200 mg every 3 weeks started on September 07, 2021.  He is currently receiving 400 mg every 6 weeks.  Last treatment was given on July 19, 2022.  He developed a mild progression of disease.  Current therapy: Padcev 1 mg/kg weekly started on September 27, 2022.  He is currently receiving it on day 1 and day 8. He is here for evaluation prior to start of cycle 3.   Interim History: Mr. Edell returns today for a follow-up.  Since the last visit, he reports few complaints including lower back pain which has been more pronounced since the last visit.  His nausea and vomiting has improved at this time and his overall fatigue remains.  He does have a poor appetite and lost some weight and his performance status remained adequate.  He reports Tylenol is effective in his pain medication and narcotic analgesic pain medication has been  ineffective previously.    Medications: Reviewed without changes. Current Outpatient Medications  Medication Sig Dispense Refill   acetaminophen (TYLENOL) 650 MG CR tablet Take 1,300 mg by mouth every morning.     amLODipine (NORVASC) 5 MG tablet Take 1 tablet (5 mg total) by mouth daily. 90 tablet 3   dexamethasone (DECADRON) 4 MG tablet Take 1 tablet (4 mg total) by mouth 2 (two) times daily. Take for 3 days after each chemotherapy treatment 24 tablet 3   hydroxychloroquine (PLAQUENIL) 200 MG tablet Take 200 mg by mouth 2 (two) times daily.     losartan (COZAAR) 25 MG tablet Take 1 tablet (25 mg total) by mouth daily. 90 tablet 3   Multiple Vitamin (MULTIVITAMIN WITH MINERALS) TABS tablet Take 1 tablet by mouth daily.     omeprazole (PRILOSEC) 40 MG capsule Take 40 mg by mouth daily.     ondansetron (ZOFRAN-ODT) 4 MG disintegrating tablet Take 1 tablet (4 mg total) by mouth every 8 (eight) hours as needed for nausea or vomiting. 20 tablet 1   prednisoLONE acetate (PRED FORTE) 1 % ophthalmic suspension Place 1 drop into the left eye 2 (two) times a week. Instill 1 drop into left eye Monday and Friday.     promethazine (PHENERGAN) 25 MG tablet Take 1 tablet (25 mg total) by mouth every 6 (six) hours as needed for nausea or vomiting. 30 tablet 0   rosuvastatin (CRESTOR) 20 MG tablet TAKE 1 TABLET(20 MG) BY MOUTH DAILY (Patient taking differently: Take 20 mg by mouth daily.) 90 tablet 1   sertraline (  ZOLOFT) 100 MG tablet Take 1 tablet (100 mg total) by mouth daily. 90 tablet 3   No current facility-administered medications for this visit.   Facility-Administered Medications Ordered in Other Visits  Medication Dose Route Frequency Provider Last Rate Last Admin   acetaminophen (TYLENOL) 325 MG tablet            diphenhydrAMINE (BENADRYL) 25 mg capsule            gemcitabine (GEMZAR) chemo syringe for bladder instillation 2,000 mg  2,000 mg Bladder Instillation Once Franchot Gallo, MD        gemcitabine [facility]) chemo syringe for bladder instillation 2,000 mg  2,000 mg Bladder Instillation Once Franchot Gallo, MD         Allergies:  Allergies  Allergen Reactions   Ace Inhibitors Cough   Diflucan [Fluconazole] Other (See Comments)    Irritated ulcers      Physical Exam:         Blood pressure 101/70, pulse 84, temperature 98.1 F (36.7 C), temperature source Temporal, resp. rate 17, height '5\' 11"'$  (1.803 m), weight 188 lb 6.4 oz (85.5 kg), SpO2 100 %.        ECOG: 1   General appearance: Uncomfortable appearing gentleman without significant distress. Head: Normocephalic without any trauma Oropharynx: Mucous membranes are moist and pink without any thrush or ulcers. Eyes: Pupils are equal and round reactive to light. Lymph nodes: No cervical, supraclavicular, inguinal or axillary lymphadenopathy.   Heart:regular rate and rhythm.  S1 and S2 without leg edema. Lung: Clear without any rhonchi or wheezes.  No dullness to percussion. Abdomin: Soft, nontender, nondistended with good bowel sounds.  No hepatosplenomegaly. Musculoskeletal: No joint deformity or effusion.  Full range of motion noted. Neurological: No deficits noted on motor, sensory and deep tendon reflex exam. Skin: No petechial rash or dryness.  Appeared moist.                Lab Results: Lab Results  Component Value Date   WBC 6.8 11/15/2022   HGB 12.6 (L) 11/15/2022   HCT 37.6 (L) 11/15/2022   MCV 87.6 11/15/2022   PLT 149 (L) 11/15/2022   PSA 0.53 11/25/2014     Chemistry      Component Value Date/Time   NA 137 11/15/2022 1423   NA 141 11/02/2021 1512   K 3.8 11/15/2022 1423   CL 107 11/15/2022 1423   CO2 23 11/15/2022 1423   BUN 24 (H) 11/15/2022 1423   BUN 23 11/02/2021 1512   CREATININE 1.08 11/15/2022 1423   CREATININE 1.05 11/25/2014 0826      Component Value Date/Time   CALCIUM 8.5 (L) 11/15/2022 1423   ALKPHOS 649 (H) 11/15/2022 1423   AST 47 (H)  11/15/2022 1423   ALT 13 11/15/2022 1423   BILITOT 0.5 11/15/2022 1423     Narrative & Impression  CLINICAL DATA:  Invasive bladder cancer. Evaluate treatment response. * Tracking Code: BO *   EXAM: CT CHEST, ABDOMEN, AND PELVIS WITH CONTRAST   TECHNIQUE: Multidetector CT imaging of the chest, abdomen and pelvis was performed following the standard protocol during bolus administration of intravenous contrast.   RADIATION DOSE REDUCTION: This exam was performed according to the departmental dose-optimization program which includes automated exposure control, adjustment of the mA and/or kV according to patient size and/or use of iterative reconstruction technique.   CONTRAST:  12m OMNIPAQUE IOHEXOL 300 MG/ML  SOLN   COMPARISON:  09/16/2022   FINDINGS: CT CHEST FINDINGS  Cardiovascular: Right Port-A-Cath tip high right atrium. Aortic atherosclerosis. Normal heart size, without pericardial effusion. Left main, lad, left circumflex coronary artery calcification. No central pulmonary embolism, on this non-dedicated study.   Mediastinum/Nodes: No supraclavicular adenopathy. No axillary adenopathy. No mediastinal or hilar adenopathy.   Lungs/Pleura: No pleural fluid.  Mild centrilobular emphysema.   Musculoskeletal: Development of sclerotic and lytic osseous lesions relatively diffusely throughout the marrow space. Example sclerotic lesion of 1.8 cm within the T9 vertebral body on 114/6.   CT ABDOMEN PELVIS FINDINGS   Hepatobiliary: Normal liver. 7 mm gallstone without acute cholecystitis or biliary duct dilatation.   Pancreas: Normal, without mass or ductal dilatation.   Spleen: Normal in size, without focal abnormality.   Adrenals/Urinary Tract: Normal adrenal glands. Interpolar right renal 1.7 cm cyst. Bilateral renal cysts of maximally 1.7 cm . In the absence of clinically indicated signs/symptoms require(s) no independent follow-up. Mild left renal cortical  thinning. Moderate renal collecting system opacification on delayed images.   Status post cystectomy and ileal conduit creation, without acute complication.   Stomach/Bowel: Normal stomach, without wall thickening. Scattered colonic diverticula. Normal terminal ileum and appendix. Nonobstructivea small bowel again enters a left paramidline ventral abdominal wall hernia on 84/2.   Vascular/Lymphatic: Infrarenal abdominal aortic aneurysm is mild at 4.0 cm and not significantly changed. No extension into the iliacs.   No abdominal adenopathy. Index right external iliac node measures 5 mm on 109/2 versus 14 mm on the prior.   Right inguinal node measures 4 mm on 113/2 versus 15 mm on the prior.   No new pelvic adenopathy.   Reproductive: Prostatectomy without local recurrence.   Other: No significant free fluid. A separate, more cephalad fat containing hernia is diminutive on 75/2.   The right upper quadrant omental nodularity has resolved, with only minimal linear density remaining on 73/2. No new peritoneal metastasis identified.   Musculoskeletal: New heterogeneous sclerosis throughout the marrow, including relatively diffusely within the pelvis on 101/2.   IMPRESSION: 1. Relatively diffuse osseous metastasis, sclerotic less than lytic, newly apparent since 09/16/2022. This could represent development of osseous metastasis or healing of previously CT occult metastasis. 2. Otherwise, response to therapy of pelvic nodal and peritoneal metastasis. 3. No extra osseous metastasis within the chest. 4. Incidental findings, including: Aortic atherosclerosis (ICD10-I70.0), coronary artery atherosclerosis and emphysema (ICD10-J43.9). Cholelithiasis. Fat and nonobstructive small bowel containing ventral abdominal wall hernias.      Impression and Plan:  62 year old man with:  1.  Bladder cancer diagnosed in January 2022.  He developed stage IV high-grade urothelial  carcinoma.   His disease status was updated at this time and treatment choices were reviewed.  CT scan obtained on November 21, 2022 showed improvement in his lymph node disease but development of diffuse bone metastasis.  It is unclear if these bone metastasis have preceded the start of Padcev and possibly are more apparent now as they become more sclerotic versus true progression on Padcev.  After discussion today, I have opted to continue with Padcev for at least 2 more cycles and repeat scans in 2 months.  I feel that he has limited options beyond Padcev to treat his cancer and stopping therapy would be problematic and has limited option beyond that.  He is agreeable to proceed with treatment and will continue the same dose and schedule and recommend repeat imaging studies in 2 months.   2.  Urinary tract infection: No signs or symptoms of infection at this time.  3.  Nausea and vomiting and failure to thrive: Related to malignancy as well as treatment.  Will continue IV hydration every other week.  4.  IV access: Port-A-Cath will continue to be in use at this time.  5.  Bone pain: Related to metastatic disease.  Will obtain MRI for better quantification and I will refer him to radiation oncology for palliative treatment.  6.  Prognosis: He has an incurable malignancy that is unfortunately progressed into the bone possibly could have happened before the start of Padcev.  Any treatment is palliative and his performance status is reasonable but potentially could decline in the future.  Aggressive measures are still warranted at this time.   7.  Follow-up: He will follow-up in 1 week for IV hydration and in 2 weeks to complete the current cycle of Padcev.   30  minutes were spent on this encounter.  The time was dedicated to updating disease status, reviewing imaging studies, treatment choices and future plan of care discussion.  Zola Button, MD 1/12/20241:52 PM

## 2022-12-02 ENCOUNTER — Telehealth: Payer: Self-pay | Admitting: Oncology

## 2022-12-02 ENCOUNTER — Telehealth: Payer: Self-pay

## 2022-12-02 ENCOUNTER — Telehealth: Payer: Self-pay | Admitting: Family Medicine

## 2022-12-02 ENCOUNTER — Other Ambulatory Visit: Payer: Self-pay | Admitting: Oncology

## 2022-12-02 MED ORDER — HYDROCODONE-ACETAMINOPHEN 5-325 MG PO TABS: 1.0000 | ORAL_TABLET | Freq: Four times a day (QID) | ORAL | 0 refills | Status: DC | PRN

## 2022-12-02 MED ORDER — ROSUVASTATIN CALCIUM 20 MG PO TABS: 20.0000 mg | ORAL_TABLET | Freq: Every day | ORAL | 1 refills | Status: AC

## 2022-12-02 NOTE — Telephone Encounter (Signed)
Scheduled per 01/12 los, patient has been called and notified of upcoming appointments.

## 2022-12-02 NOTE — Telephone Encounter (Signed)
Patient is requesting refillrosuvastatin (CRESTOR) 20 MG tablet  to be sent to [facility].

## 2022-12-02 NOTE — Telephone Encounter (Signed)
Script sent to pharmacy and pt is aware

## 2022-12-02 NOTE — Telephone Encounter (Signed)
T/C from pt's wife stating she has not heard anything from scheduling regarding a radiation appt or any chemo appts. *Advised her referral  has been made to [facility] and to wait to hear back from them this week and from our scheduling regarding chemo appts.  Pt saw Anda Kraft on 12/20 and was given a prescription for Norco 5/325 mg for his back pain.  She said he is taking 2 at bedtime and will need a new RX sent to [facility].   Pt was advised by Anda Kraft to make appt with Palliative Care for his back pain but no appts are scheduled.  One of the side effects of his medication is causing severe dry eye and Refresh is not helping.  Can we send in a prescription for something stronger?

## 2022-12-03 ENCOUNTER — Telehealth: Payer: Self-pay

## 2022-12-03 ENCOUNTER — Ambulatory Visit
Admission: RE | Admit: 2022-12-03 | Discharge: 2022-12-03 | Disposition: A | Discharge status: Home / Self Care | Payer: BC Managed Care – PPO | Source: Ambulatory Visit | Attending: Radiation Oncology | Admitting: Radiation Oncology

## 2022-12-03 ENCOUNTER — Other Ambulatory Visit: Payer: Self-pay

## 2022-12-03 VITALS — Ht 71.0 in | Wt 188.0 lb

## 2022-12-03 DIAGNOSIS — C679 Malignant neoplasm of bladder, unspecified: Secondary | ICD-10-CM

## 2022-12-03 NOTE — Progress Notes (Signed)
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 Malignant neoplasm of urinary bladder, unspecified site ([place])  C67.9     2. 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 male seen at the request of Dr. Alen Blew. He was initially diagnosed with bladder cancer after presenting with hematuria in early 07/2019. He underwent TURBT on 08/05/19 under the care of Dr. Diona Fanti with pathology showing invasive high grade papillary urothelial carcinoma, invading the laminar propria at the level above the muscularis mucosa, not involving muscularis propria. He was taken for repeat cystoscopy with TURBT on 09/13/22 with similar pathology in specimens from the left bladder wall and bladder neck.  He completed 6 cycles of induction BCG followed by maintenance BCG in March 2021.  He underwent repeat TURBT on 04/24/20 again with the same pathology results, as well as benign biopsy from the prostatic urethra. He resumed induction BCG and subsequently developed disease progression as well as malignant hydronephrosis in 09/2020 and required bilateral nephrostomy tubes. He proceeded to radical cystoprostatectomy with lymphadenectomy on 11/29/20.  Final surgical pathology showed residual invasive high grade papillary urothelial carcinoma involving the trigone (1.5 cm) and directly invading into the prostatic ducts and stroma (pT4a).  Urothelial carcinoma in situ was identified at the right ureteral os,  anterior bladder wall, and prostatic urothelium but all surgical margins were negative. 4 of 7 lymph nodes were involved with extranodal extension present (pN2).  He underwent restaging scans on 04/19/21 showing interval progression of abdominopelvic adenopathy with retroperitoneal mesenteric and pelvic soft tissue stranding. He was referred to Dr. Alen Blew on 05/18/21 and was treated with four cycles of chemotherapy with gemcitabine and cisplatin 06/08/21 through 08/17/21.  Disease restaging scans showed a good response to treatment so he was started on maintenance pembrolizumab from 09/07/21 through 07/19/22.  He developed mild disease progression in October 2023 and was therefore switched to [facility] on 09/27/22.  He developed some low back pain that has been progressive over the past 3 months.  His most recent restaging CT C/A/P on 11/21/22 showed new, diffuse, mostly lytic osseous metastasis as compared to his last scan from 09/16/22.  Otherwise, response of the pelvic nodal and peritoneal metastasis to therapy and no extraosseous metastasis within the chest. He is scheduled for thoracic and lumbar spine MRI on 12/10/22.  His low back pain has been progressively worsening and less responsive to pain medications over the past 2 to 3 weeks.  Therefore, he has been kindly referred to [place] today for discussion of potential palliative radiation therapy to the painful sites of disease.  PREVIOUS RADIATION THERAPY: No  PAST MEDICAL HISTORY:  Past Medical History:  Diagnosis Date   Cancer [facility])    bladder   Cataract    COVID-19 11/13/2019   h/a, chills, fatigue, loss of taste and smell, all symptoms resolved in a few weeks   Frequent urination    Fuchs' corneal dystrophy  Full dentures    GERD (gastroesophageal reflux disease)    Hypertension    Rheumatoid arthritis ([place])       PAST SURGICAL HISTORY: Past Surgical History:  Procedure Laterality Date   CORNEAL TRANSPLANT Left 2013   CYSTOSCOPY WITH BIOPSY  N/A 09/14/2019   Procedure: CYSTOSCOPY WITH BIOPSY;  Surgeon: Franchot Gallo, MD;  Location: [facility];  Service: Urology;  Laterality: N/A;   CYSTOSCOPY WITH INJECTION N/A 11/29/2020   Procedure: CYSTOSCOPY WITH INJECTION OF INDOCYANINE GREEN DYE;  Surgeon: Alexis Frock, MD;  Location: [facility];  Service: Urology;  Laterality: N/A;  6 HRS   FULGURATION OF BLADDER TUMOR N/A 09/14/2019   Procedure: FULGURATION OF BLADDER TUMOR;  Surgeon: Franchot Gallo, MD;  Location: [facility];  Service: Urology;  Laterality: N/A;   IR IMAGING GUIDED PORT INSERTION  06/07/2021   IR NEPHROSTOMY EXCHANGE LEFT  10/30/2020   IR NEPHROSTOMY EXCHANGE LEFT  11/19/2020   IR NEPHROSTOMY EXCHANGE RIGHT  10/30/2020   IR NEPHROSTOMY EXCHANGE RIGHT  11/19/2020   IR NEPHROSTOMY PLACEMENT LEFT  09/27/2020   IR NEPHROSTOMY PLACEMENT RIGHT  09/27/2020   ROBOT ASSISTED LAPAROSCOPIC COMPLETE CYSTECT ILEAL CONDUIT N/A 11/29/2020   Procedure: XI ROBOTIC ASSISTED LAPAROSCOPIC COMPLETE CYSTECT ILEAL CONDUIT/RADICAL PROSTATECTOMY/LYMPHADENECTOMY;  Surgeon: Alexis Frock, MD;  Location: [facility];  Service: Urology;  Laterality: N/A;   TRANSURETHRAL RESECTION OF BLADDER TUMOR WITH MITOMYCIN-C N/A 08/05/2019   Procedure: TRANSURETHRAL RESECTION OF BLADDER TUMOR;  Surgeon: Franchot Gallo, MD;  Location: [facility];  Service: Urology;  Laterality: N/A;  1 HR   TRANSURETHRAL RESECTION OF BLADDER TUMOR WITH MITOMYCIN-C N/A 04/24/2020   Procedure: TRANSURETHRAL RESECTION OF BLADDER TUMOR WITH GEMCIDABINE;  Surgeon: Franchot Gallo, MD;  Location: [facility];  Service: Urology;  Laterality: N/A;    FAMILY HISTORY: No family history on file.  SOCIAL HISTORY:  Social History   Socioeconomic History   Marital status: Married    Spouse name: Not on file   Number of children: Not on file   Years of education: Not on file   Highest education level: Not on file  Occupational History   Not on file  Tobacco  Use   Smoking status: Former    Packs/day: 0.50    Years: 20.00    Total pack years: 10.00    Types: Cigarettes   Smokeless tobacco: Never   Tobacco comments:    quit 2010  Vaping Use   Vaping Use: Never used  Substance and Sexual Activity   Alcohol use: Yes    Comment: occ  2 x month   Drug use: No   Sexual activity: Yes  Other Topics Concern   Not on file  Social History Narrative   Not on file   Social Determinants of Health   Financial Resource Strain: Not on file  Food Insecurity: Not on file  Transportation Needs: Not on file  Physical Activity: Not on file  Stress: Not on file  Social Connections: Not on file  Intimate Partner Violence: Not on file    ALLERGIES: Ace inhibitors and Diflucan [fluconazole]  MEDICATIONS:  Current Outpatient Medications  Medication Sig Dispense Refill   predniSONE (DELTASONE) 5 MG tablet 4 tablet Orally Once a day as needed for flares for 30 days     acetaminophen (TYLENOL) 650 MG CR tablet Take 1,300 mg by mouth every morning.     amLODipine (NORVASC) 5 MG tablet Take 1 tablet (5 mg total) by mouth daily. 90 tablet  3   dexamethasone (DECADRON) 4 MG tablet Take 1 tablet (4 mg total) by mouth 2 (two) times daily. Take for 3 days after each chemotherapy treatment 24 tablet 3   HYDROcodone-acetaminophen (NORCO) 5-325 MG tablet Take 1 tablet by mouth every 6 (six) hours as needed for moderate pain. 60 tablet 0   hydroxychloroquine (PLAQUENIL) 200 MG tablet Take 200 mg by mouth 2 (two) times daily.     losartan (COZAAR) 25 MG tablet Take 1 tablet (25 mg total) by mouth daily. 90 tablet 3   Multiple Vitamin (MULTIVITAMIN WITH MINERALS) TABS tablet Take 1 tablet by mouth daily.     omeprazole (PRILOSEC) 40 MG capsule Take 40 mg by mouth daily.     omeprazole (PRILOSEC) 40 MG capsule 1 capsule 30 minutes before morning meal Orally Once a day for 30 day(s)     ondansetron (ZOFRAN-ODT) 4 MG disintegrating tablet Take 1 tablet (4 mg total) by  mouth every 8 (eight) hours as needed for nausea or vomiting. 20 tablet 1   prednisoLONE acetate (PRED FORTE) 1 % ophthalmic suspension Place 1 drop into the left eye 2 (two) times a week. Instill 1 drop into left eye Monday and Friday.     promethazine (PHENERGAN) 25 MG tablet Take 1 tablet (25 mg total) by mouth every 6 (six) hours as needed for nausea or vomiting. 30 tablet 0   rosuvastatin (CRESTOR) 20 MG tablet Take 1 tablet (20 mg total) by mouth daily. 90 tablet 1   sertraline (ZOLOFT) 100 MG tablet Take 1 tablet (100 mg total) by mouth daily. 90 tablet 3   No current facility-administered medications for this encounter.   Facility-Administered Medications Ordered in Other Encounters  Medication Dose Route Frequency Provider Last Rate Last Admin   acetaminophen (TYLENOL) 325 MG tablet            diphenhydrAMINE (BENADRYL) 25 mg capsule            gemcitabine (GEMZAR) chemo syringe for bladder instillation 2,000 mg  2,000 mg Bladder Instillation Once Franchot Gallo, MD       gemcitabine [facility]) chemo syringe for bladder instillation 2,000 mg  2,000 mg Bladder Instillation Once Franchot Gallo, MD        REVIEW OF SYSTEMS:  On review of systems, the Barry reports that he is doing well overall. He denies any chest pain, shortness of breath, cough, fevers, chills, or night sweats. He denies any bowel or bladder disturbances, and denies any current abdominal pain or vomiting. He reports a weight loss of 20 lbs and some nausea associated with the chemotherapy. He describes his pain as 7/10 in his lower back but denies any focal weakness or paresthesias.  He had been managing his pain with Tylenol as needed but more recently, has needed to use the narcotic pain medication more regularly.  He has increased to two Vicodin at night to help him rest which does seem to help but only temporary.  A complete review of systems is obtained and is otherwise negative.    PHYSICAL EXAM:  Wt Readings  from Last 3 Encounters:  12/03/22 188 lb (85.3 kg)  11/29/22 188 lb 6.4 oz (85.5 kg)  11/21/22 190 lb 12.8 oz (86.5 kg)   Temp Readings from Last 3 Encounters:  11/29/22 98.1 F (36.7 C) (Temporal)  11/21/22 98.1 F (36.7 C) (Temporal)  11/06/22 97.9 F (36.6 C) (Oral)   BP Readings from Last 3 Encounters:  11/29/22 101/70  11/21/22 121/80  11/06/22 103/62   Pulse Readings from Last 3 Encounters:  11/29/22 84  11/21/22 80  11/06/22 69   Pain Assessment Pain Score: 7  Pain Loc: Back (lower back)/10  Physical exam not performed in light of telephone consult visit format.   KPS = 90  100 - Normal; no complaints; no evidence of disease. 90   - Able to carry on normal activity; minor signs or symptoms of disease. 80   - Normal activity with effort; some signs or symptoms of disease. 37   - Cares for self; unable to carry on normal activity or to do active work. 60   - Requires occasional assistance, but is able to care for most of his personal needs. 50   - Requires considerable assistance and frequent medical care. 39   - Disabled; requires special care and assistance. 60   - Severely disabled; hospital admission is indicated although death not imminent. 56   - Very sick; hospital admission necessary; active supportive treatment necessary. 10   - Moribund; fatal processes progressing rapidly. 0     - Dead  Karnofsky DA, Abelmann [place], Craver LS and Burchenal [facility] [phone]) The use of the nitrogen mustards in the palliative treatment of carcinoma: with particular reference to bronchogenic carcinoma Cancer 1 634-56  LABORATORY DATA:  Lab Results  Component Value Date   WBC 4.5 11/29/2022   HGB 11.4 (L) 11/29/2022   HCT 34.3 (L) 11/29/2022   MCV 88.9 11/29/2022   PLT 154 11/29/2022   Lab Results  Component Value Date   NA 139 11/29/2022   K 4.1 11/29/2022   CL 108 11/29/2022   CO2 24 11/29/2022   Lab Results  Component Value Date   ALT 11 11/29/2022   AST 43 (H)  11/29/2022   ALKPHOS 762 (H) 11/29/2022   BILITOT 0.4 11/29/2022     RADIOGRAPHY: CT CHEST ABDOMEN PELVIS W CONTRAST  Result Date: 11/22/2022 CLINICAL DATA:  Invasive bladder cancer. Evaluate treatment response. * Tracking Code: BO * EXAM: CT CHEST, ABDOMEN, AND PELVIS WITH CONTRAST TECHNIQUE: Multidetector CT imaging of the chest, abdomen and pelvis was performed following the standard protocol during bolus administration of intravenous contrast. RADIATION DOSE REDUCTION: This exam was performed according to the departmental dose-optimization program which includes automated exposure control, adjustment of the mA and/or kV according to Barry size and/or use of iterative reconstruction technique. CONTRAST:  143m OMNIPAQUE IOHEXOL 300 MG/ML  SOLN COMPARISON:  09/16/2022 FINDINGS: CT CHEST FINDINGS Cardiovascular: Right Port-A-Cath tip high right atrium. Aortic atherosclerosis. Normal heart size, without pericardial effusion. Left main, lad, left circumflex coronary artery calcification. No central pulmonary embolism, on this non-dedicated study. Mediastinum/Nodes: No supraclavicular adenopathy. No axillary adenopathy. No mediastinal or hilar adenopathy. Lungs/Pleura: No pleural fluid.  Mild centrilobular emphysema. Musculoskeletal: Development of sclerotic and lytic osseous lesions relatively diffusely throughout the marrow space. Example sclerotic lesion of 1.8 cm within the T9 vertebral body on 114/6. CT ABDOMEN PELVIS FINDINGS Hepatobiliary: Normal liver. 7 mm gallstone without acute cholecystitis or biliary duct dilatation. Pancreas: Normal, without mass or ductal dilatation. Spleen: Normal in size, without focal abnormality. Adrenals/Urinary Tract: Normal adrenal glands. Interpolar right renal 1.7 cm cyst. Bilateral renal cysts of maximally 1.7 cm . In the absence of clinically indicated signs/symptoms require(s) no independent follow-up. Mild left renal cortical thinning. Moderate renal collecting  system opacification on delayed images. Status post cystectomy and ileal conduit creation, without acute complication. Stomach/Bowel: Normal stomach, without wall thickening. Scattered colonic diverticula. Normal terminal  ileum and appendix. Nonobstructivea small bowel again enters a left paramidline ventral abdominal wall hernia on 84/2. Vascular/Lymphatic: Infrarenal abdominal aortic aneurysm is mild at 4.0 cm and not significantly changed. No extension into the iliacs. No abdominal adenopathy. Index right external iliac node measures 5 mm on 109/2 versus 14 mm on the prior. Right inguinal node measures 4 mm on 113/2 versus 15 mm on the prior. No new pelvic adenopathy. Reproductive: Prostatectomy without local recurrence. Other: No significant free fluid. A separate, more cephalad fat containing hernia is diminutive on 75/2. The right upper quadrant omental nodularity has resolved, with only minimal linear density remaining on 73/2. No new peritoneal metastasis identified. Musculoskeletal: New heterogeneous sclerosis throughout the marrow, including relatively diffusely within the pelvis on 101/2. IMPRESSION: 1. Relatively diffuse osseous metastasis, sclerotic less than lytic, newly apparent since 09/16/2022. This could represent development of osseous metastasis or healing of previously CT occult metastasis. 2. Otherwise, response to therapy of pelvic nodal and peritoneal metastasis. 3. No extra osseous metastasis within the chest. 4. Incidental findings, including: Aortic atherosclerosis (ICD10-I70.0), coronary artery atherosclerosis and emphysema (ICD10-J43.9). Cholelithiasis. Fat and nonobstructive small bowel containing ventral abdominal wall hernias. Electronically Signed   By: Abigail Miyamoto M.D.   On: 11/22/2022 13:03      IMPRESSION/PLAN: This visit was conducted via telephone to spare the Barry unnecessary potential exposure in the healthcare setting during the current COVID-19 pandemic. 1. 62 y.o.  man with painful osseous metastases in the spine from stage IV p(T4a N2) bladder cancer.  Today, we talked to the Barry and and his Gibson, Barry Gibson, about the findings and workup thus far. We discussed the natural history of metastatic urothelial carcinoma and general treatment, highlighting the role of palliative radiotherapy in the management of painful osseous metastases. We discussed the available radiation techniques, and focused on the details and logistics of delivery.  The recommendation is for a 2-week course of daily palliative radiotherapy to the painful sites of disease in the lumbar spine.  We reviewed the anticipated acute and late sequelae associated with radiation in this setting.  We also encouraged the Barry to keep his scheduled imaging appointments for MRI of the thoracic and lumbar spine on 12/10/2022 to assess for any further worrisome sites of disease.  The Barry and his Gibson were encouraged to ask questions that were answered to their stated satisfaction.  At the end of our conversation, the Barry would like to proceed with the recommended 2-week course of daily palliative radiotherapy to the painful sites of disease in the lumbar spine.  He has provided verbal consent to proceed today so we will share this discussion with Dr. Alen Blew and proceed with treatment planning accordingly.  He is tentatively scheduled for CT simulation/treatment planning on Wednesday, 12/04/2022 at 9 AM, in anticipation of beginning his treatments on Thursday, 12/05/2022.  He will sign formal written consent at the time of his CT scan appointment.  We enjoyed meeting him and his Gibson today and look forward to continuing participate in his care.  Given current concerns for Barry exposure during the COVID-19 pandemic, this encounter was conducted via telephone. The Barry was notified in advance and was offered a [place] meeting to allow for face to face communication but unfortunately reported that he did not  have the appropriate resources/technology to support such a visit and instead preferred to proceed with telephone consult. The Barry has given verbal consent for this type of encounter. The attendants for this meeting include Tyler Pita  MD, Freeman Caldron PA-C, Barry Gibson and his Gibson, Barry Gibson. During the encounter, Tyler Pita MD and Freeman Caldron PA-C were located at [facility].  Barry Gibson and his Gibson, Barry Gibson, were located at home.   We personally spent 60 minutes in this encounter including chart review, reviewing radiological studies, meeting face-to-face with the Barry, entering orders and completing documentation.    Nicholos Johns, PA-C    Tyler Pita, MD  [facility] Direct Dial: [phone]  Fax: [phone] [email or web address]  Skype  [email or web address]   This document serves as a record of services personally performed by Tyler Pita, MD and Freeman Caldron, PA-C. It was created on their behalf by Wilburn Mylar, a trained medical scribe. The creation of this record is based on the scribe's personal observations and the provider's statements to them. This document has been checked and approved by the attending provider.

## 2022-12-03 NOTE — Telephone Encounter (Signed)
Notified Patient of prior authorization approval for Hydrocodone 5/325 mg tablets. Medication is approved through 12/04/2023. No other needs or concerns voiced at this time.

## 2022-12-03 NOTE — Progress Notes (Addendum)
GU Location of Tumor / Histology: Bladder cancer diagnosed in January 2022. 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  Mets to spine  06/17/2022 Dr. Alen Blew CT Chest Abdomen Pelvis with Contrast CLINICAL DATA:  Invasive bladder cancer. Assess treatment response.  History of cystectomy. Chemotherapy complete. Immunotherapy ongoing. * Tracking Code: BO   IMPRESSION: Chest Impression:   1. No evidence of thoracic metastasis. 2. Coronary artery calcification and Aortic Atherosclerosis (ICD10-I70.0).   Abdomen / Pelvis Impression:   1. Interval enlargement of peritoneal nodular implant in the LEFT lower quadrant/iliac fossa. 2. Stable enlarged RIGHT external iliac lymph node. 3. Post cystectomy anatomy and ileal conduit formation without complication. 4. 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 No follow-up recommended for this lesion. 5. 3.9 cm infrarenal abdominal aortic aneurysm. Recommend follow-up every 2 years. Reference: Joellyn Rued Radiol 7371;06:269-485.  09/16/2022 Dr. Philip Aspen CT Abdomen Pelvis with Contrast CLINICAL DATA:  Nausea and vomiting. Left lower back pain  IMPRESSION: 1. Post cystoprostatectomy with ileal conduit. 2. Right external iliac node has increased in size from prior exam, now 14 mm, previously 12 mm. There is a new enlarged 15 mm lymph node in the high right inguinal region, previously 7 mm. Findings are suspicious for nodal metastasis. 3. Faint ill-defined soft tissue density in the right upper quadrant of the abdomen, not definitively seen on prior exam. This may represent omental metastatic involvement. 4. Unchanged infrarenal aortic aneurysm measuring 4 cm. Recommend follow-up every 12 months and vascular consultation. This recommendation follows ACR consensus guidelines: White Paper of the ACR Incidental Findings Committee II on Vascular Findings. J Am Coll Radiol 2013; 10:789-794. 5.  Cholelithiasis without gallbladder inflammation. 6. Colonic diverticulosis without diverticulitis. 7. Ventral abdominal wall. Hernia at the level of the ostomy contains a short segment of small bowel, but no associated inflammation. Supraumbilical ventral abdominal wall hernia contains only fat. 8. The previous left lower quadrant peritoneal nodule is not convincingly seen on the current exam.   11/21/2022 Dr. Alen Blew CT Chest Abdomen Pelvis with Contrast CLINICAL DATA:  Invasive bladder cancer. Evaluate treatment response. * Tracking Code: BO *  IMPRESSION: 1. Relatively diffuse osseous metastasis, sclerotic less than lytic, newly apparent since 09/16/2022. This could represent development of osseous metastasis or healing of previously CT occult metastasis. 2. Otherwise, response to therapy of pelvic nodal and peritoneal metastasis. 3. No extra osseous metastasis within the chest. 4. Incidental findings, including: Aortic atherosclerosis (ICD10-I70.0), coronary artery atherosclerosis and emphysema (ICD10-J43.9). Cholelithiasis. Fat and nonobstructive small bowel containing ventral abdominal wall hernias.  Past/Anticipated interventions by urology, if any: NA  Past/Anticipated interventions by medical oncology, if any:   Dr. Alen Blew Impression and Plan:   62 year old man with:  1.  Bladder cancer diagnosed in January 2022.  He developed stage IV high-grade urothelial carcinoma. His disease status was updated at this time and treatment choices were reviewed.  CT scan obtained on November 21, 2022 showed improvement in his lymph node disease but development of diffuse bone metastasis.  It is unclear if these bone metastasis have preceded the start of Padcev and possibly are more apparent now as they become more sclerotic versus true progression on Padcev.   After discussion today, I have opted to continue with Padcev for at least 2 more cycles and repeat scans in 2 months.  I feel that he has  limited options beyond Padcev to treat his cancer and stopping therapy would be problematic and has limited option  beyond that.   He is agreeable to proceed with treatment and will continue the same dose and schedule and recommend repeat imaging studies in 2 months.   2.  Urinary tract infection: No signs or symptoms of infection at this time.   3.  Nausea and vomiting and failure to thrive: Related to malignancy as well as treatment.  Will continue IV hydration every other week.   4.  IV access: Port-A-Cath will continue to be in use at this time.   5.  Bone pain: Related to metastatic disease.  Will obtain MRI for better quantification and I will refer him to radiation oncology for palliative treatment.   6.  Prognosis: He has an incurable malignancy that is unfortunately progressed into the bone possibly could have happened before the start of Padcev.  Any treatment is palliative and his performance status is reasonable but potentially could decline in the future.  Aggressive measures are still warranted at this time.   7.  Follow-up: He will follow-up in 1 week for IV hydration and in 2 weeks to complete the current cycle of Padcev.  Weight changes, if any: Loss of 20 lbs  Bowel/Bladder complaints, if any:  No  Nausea/Vomiting, if any: Some nausea and no vomiting  Pain issues, if any:  7/10 lower back  SAFETY ISSUES: Prior radiation? No Pacemaker/ICD? No Possible current pregnancy? Male Is the patient on methotrexate? No  Current Complaints / other details:  Right IJ approach Port-A-Cath with the tip projecting at the superior right atrium.

## 2022-12-04 ENCOUNTER — Ambulatory Visit
Admission: RE | Admit: 2022-12-04 | Discharge: 2022-12-04 | Disposition: A | Discharge status: Home / Self Care | Payer: BC Managed Care – PPO | Source: Ambulatory Visit | Attending: Radiation Oncology | Admitting: Radiation Oncology

## 2022-12-04 DIAGNOSIS — C679 Malignant neoplasm of bladder, unspecified: Secondary | ICD-10-CM

## 2022-12-04 NOTE — Progress Notes (Signed)
  Radiation Oncology         [phone]) [phone] ________________________________  Name: Barry Gibson MRN: [id number]  Date: 12/04/2022  DOB: 07-23-1961  SIMULATION AND TREATMENT PLANNING NOTE    ICD-10-CM   1. 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 patient with lumbar metastasis  NARRATIVE:  The patient was brought to the [place] suite.  Identity was confirmed.  All relevant records and images related to the planned course of therapy were reviewed.  The patient freely provided informed written consent to proceed with treatment after reviewing the details related to the planned course of therapy. The consent form was witnessed and verified by the simulation staff.  Then, the patient was set-up in a stable reproducible  supine position for radiation therapy.  CT images were obtained.  Surface markings were placed.  The CT images were loaded into the planning software.  Then the target and avoidance structures were contoured including kidneys.  Treatment planning then occurred.  The radiation prescription was entered and confirmed.  Then, I designed and supervised the construction of a total of 3 medically necessary complex treatment devices with VacLoc positioner and 2 MLCs to shield kidneys.  I have requested : 3D Simulation  I have requested a DVH of the following structures: Left Kidney, Right Kidney and target.  PLAN:  The patient will receive 30 Gy in 10 fractions.  ________________________________  Sheral Apley Tammi Klippel, M.D.

## 2022-12-05 ENCOUNTER — Ambulatory Visit
Admission: RE | Admit: 2022-12-05 | Discharge: 2022-12-05 | Disposition: A | Discharge status: Home / Self Care | Payer: BC Managed Care – PPO | Source: Ambulatory Visit | Attending: Radiation Oncology | Admitting: Radiation Oncology

## 2022-12-05 ENCOUNTER — Ambulatory Visit: Payer: BC Managed Care – PPO

## 2022-12-05 ENCOUNTER — Other Ambulatory Visit: Payer: Self-pay

## 2022-12-05 LAB — RAD ONC ARIA SESSION SUMMARY
Course Elapsed Days: 0
Plan Fractions Treated to Date: 1
Plan Prescribed Dose Per Fraction: 3 Gy
Plan Total Fractions Prescribed: 10
Plan Total Prescribed Dose: 30 Gy
Reference Point Dosage Given to Date: 3 Gy
Reference Point Session Dosage Given: 3 Gy
Session Number: 1

## 2022-12-06 ENCOUNTER — Ambulatory Visit
Admission: RE | Admit: 2022-12-06 | Discharge: 2022-12-06 | Disposition: A | Discharge status: Home / Self Care | Payer: BC Managed Care – PPO | Source: Ambulatory Visit | Attending: Radiation Oncology | Admitting: Radiation Oncology

## 2022-12-06 ENCOUNTER — Inpatient Hospital Stay: Payer: BC Managed Care – PPO

## 2022-12-06 ENCOUNTER — Other Ambulatory Visit: Payer: Self-pay

## 2022-12-06 ENCOUNTER — Ambulatory Visit: Payer: BC Managed Care – PPO

## 2022-12-06 VITALS — BP 120/73 | HR 81 | Temp 97.7°F | Resp 16

## 2022-12-06 DIAGNOSIS — C679 Malignant neoplasm of bladder, unspecified: Secondary | ICD-10-CM

## 2022-12-06 DIAGNOSIS — Z95828 Presence of other vascular implants and grafts: Secondary | ICD-10-CM

## 2022-12-06 LAB — RAD ONC ARIA SESSION SUMMARY
Course Elapsed Days: 1
Plan Fractions Treated to Date: 2
Plan Prescribed Dose Per Fraction: 3 Gy
Plan Total Fractions Prescribed: 10
Plan Total Prescribed Dose: 30 Gy
Reference Point Dosage Given to Date: 6 Gy
Reference Point Session Dosage Given: 3 Gy
Session Number: 2

## 2022-12-06 MED ORDER — SODIUM CHLORIDE 0.9% FLUSH
10.0000 mL | Freq: Once | INTRAVENOUS | Status: AC
  Administered 2022-12-06: 10 mL

## 2022-12-06 MED ORDER — SODIUM CHLORIDE 0.9 % IV SOLN: Freq: Once | INTRAVENOUS | Status: AC

## 2022-12-06 MED ORDER — HEPARIN SOD (PORK) LOCK FLUSH 100 UNIT/ML IV SOLN
500.0000 [IU] | Freq: Once | INTRAVENOUS | Status: AC
  Administered 2022-12-06: 500 [IU]

## 2022-12-09 ENCOUNTER — Other Ambulatory Visit: Payer: Self-pay

## 2022-12-09 ENCOUNTER — Other Ambulatory Visit: Payer: Self-pay | Admitting: Nurse Practitioner

## 2022-12-09 ENCOUNTER — Telehealth: Payer: Self-pay | Admitting: *Deleted

## 2022-12-09 ENCOUNTER — Ambulatory Visit
Admission: RE | Admit: 2022-12-09 | Discharge: 2022-12-09 | Disposition: A | Discharge status: Home / Self Care | Payer: BC Managed Care – PPO | Source: Ambulatory Visit | Attending: Radiation Oncology | Admitting: Radiation Oncology

## 2022-12-09 DIAGNOSIS — C679 Malignant neoplasm of bladder, unspecified: Secondary | ICD-10-CM

## 2022-12-09 LAB — RAD ONC ARIA SESSION SUMMARY
Course Elapsed Days: 4
Plan Fractions Treated to Date: 3
Plan Prescribed Dose Per Fraction: 3 Gy
Plan Total Fractions Prescribed: 10
Plan Total Prescribed Dose: 30 Gy
Reference Point Dosage Given to Date: 9 Gy
Reference Point Session Dosage Given: 3 Gy
Session Number: 3

## 2022-12-09 NOTE — Telephone Encounter (Signed)
This is regarding a former Dr Barry Gibson patient - PC received from patient's wife Barry Gibson - she states patient has had a very rough weekend due to his back pain.  He has been taking his Norco as prescribed as well as using a heating pad to his back but without relief.  Barry Gibson is asking if he can possibly have a stronger pain medication. Dr Burr Medico, please advise if you would like this patient referred to Palliative Care.

## 2022-12-09 NOTE — Progress Notes (Signed)
[place]  [phone]) [phone] Fax:(336) [phone]   Name: DAVEN MONTZ Date: 12/09/2022 MRN: [id number]  DOB: 1961-06-21  Patient Care Team: Coral Spikes, DO as PCP - General (Family Medicine)    REASON FOR CONSULTATION: CHISOM AUST is a 62 y.o. male with oncologic medical history including bladder cancer diagnosed in January 2022.  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 Palliative ask to see for symptom management and goals of care.    SOCIAL HISTORY:     reports that he has quit smoking. His smoking use included cigarettes. He has a 10.00 pack-year smoking history. He has never used smokeless tobacco. He reports current alcohol use. He reports that he does not use drugs.  ADVANCE DIRECTIVES:  None on file  CODE STATUS: Full code  PAST MEDICAL HISTORY: Past Medical History:  Diagnosis Date   Cancer ([place])    bladder   Cataract    COVID-19 11/13/2019   h/a, chills, fatigue, loss of taste and smell, all symptoms resolved in a few weeks   Frequent urination    Fuchs' corneal dystrophy    Full dentures    GERD (gastroesophageal reflux disease)    Hypertension    Rheumatoid arthritis ([place])     PAST SURGICAL HISTORY:  Past Surgical History:  Procedure Laterality Date   CORNEAL TRANSPLANT Left 2013   CYSTOSCOPY WITH BIOPSY N/A 09/14/2019   Procedure: CYSTOSCOPY WITH BIOPSY;  Surgeon: Franchot Gallo, MD;  Location: [facility];  Service: Urology;  Laterality: N/A;   CYSTOSCOPY WITH INJECTION N/A 11/29/2020   Procedure: CYSTOSCOPY WITH INJECTION OF INDOCYANINE GREEN DYE;  Surgeon: Alexis Frock, MD;  Location: [facility];  Service: Urology;  Laterality: N/A;  6 HRS   FULGURATION OF BLADDER TUMOR N/A 09/14/2019   Procedure: FULGURATION OF BLADDER TUMOR;  Surgeon: Franchot Gallo, MD;  Location: [facility];  Service: Urology;  Laterality: N/A;   IR IMAGING GUIDED PORT  INSERTION  06/07/2021   IR NEPHROSTOMY EXCHANGE LEFT  10/30/2020   IR NEPHROSTOMY EXCHANGE LEFT  11/19/2020   IR NEPHROSTOMY EXCHANGE RIGHT  10/30/2020   IR NEPHROSTOMY EXCHANGE RIGHT  11/19/2020   IR NEPHROSTOMY PLACEMENT LEFT  09/27/2020   IR NEPHROSTOMY PLACEMENT RIGHT  09/27/2020   ROBOT ASSISTED LAPAROSCOPIC COMPLETE CYSTECT ILEAL CONDUIT N/A 11/29/2020   Procedure: XI ROBOTIC ASSISTED LAPAROSCOPIC COMPLETE CYSTECT ILEAL CONDUIT/RADICAL PROSTATECTOMY/LYMPHADENECTOMY;  Surgeon: Alexis Frock, MD;  Location: [facility];  Service: Urology;  Laterality: N/A;   TRANSURETHRAL RESECTION OF BLADDER TUMOR WITH MITOMYCIN-C N/A 08/05/2019   Procedure: TRANSURETHRAL RESECTION OF BLADDER TUMOR;  Surgeon: Franchot Gallo, MD;  Location[facility];  Service: Urology;  Laterality: N/A;  1 HR   TRANSURETHRAL RESECTION OF BLADDER TUMOR WITH MITOMYCIN-C N/A 04/24/2020   Procedure: TRANSURETHRAL RESECTION OF BLADDER TUMOR WITH GEMCIDABINE;  Surgeon: Franchot Gallo, MD;  Location: [facility];  Service: Urology;  Laterality: N/A;    HEMATOLOGY/ONCOLOGY HISTORY:  Oncology History  Bladder cancer ([place])  03/06/2020 Initial Diagnosis   Bladder cancer ([place])   05/18/2021 Cancer Staging   Staging form: Urinary Bladder, AJCC 8th Edition - Clinical: Stage IVB (cT4, cN2, cM1b) - Signed by Wyatt Portela, MD on 05/18/2021 WHO/ISUP grade (low/high): High Grade Histologic grading system: 2 grade system   06/08/2021 - 08/17/2021 Chemotherapy   Patient is on Treatment Plan : BLADDER Cisplatin D1 + Gemcitabine D1,8 q21d x 6 Cycles     09/07/2021 -  06/07/2022 Chemotherapy   Patient is on Treatment Plan : HEAD/NECK Pembrolizumab      09/07/2021 - 07/19/2022 Chemotherapy   Patient is on Treatment Plan : ANUS Pembrolizumab (400) q42d     09/27/2022 -  Chemotherapy   Patient is on Treatment Plan : UROTHELIAL LOCALLY ADVANCED, METASTATIC Enfortumab D1,8,15 q28d       ALLERGIES:  is allergic to  ace inhibitors and diflucan [fluconazole].  MEDICATIONS:  Current Outpatient Medications  Medication Sig Dispense Refill   acetaminophen (TYLENOL) 650 MG CR tablet Take 1,300 mg by mouth every morning.     amLODipine (NORVASC) 5 MG tablet Take 1 tablet (5 mg total) by mouth daily. 90 tablet 3   dexamethasone (DECADRON) 4 MG tablet Take 1 tablet (4 mg total) by mouth 2 (two) times daily. Take for 3 days after each chemotherapy treatment 24 tablet 3   HYDROcodone-acetaminophen (NORCO) 5-325 MG tablet Take 1 tablet by mouth every 6 (six) hours as needed for moderate pain. 60 tablet 0   hydroxychloroquine (PLAQUENIL) 200 MG tablet Take 200 mg by mouth 2 (two) times daily.     losartan (COZAAR) 25 MG tablet Take 1 tablet (25 mg total) by mouth daily. 90 tablet 3   Multiple Vitamin (MULTIVITAMIN WITH MINERALS) TABS tablet Take 1 tablet by mouth daily.     omeprazole (PRILOSEC) 40 MG capsule Take 40 mg by mouth daily.     omeprazole (PRILOSEC) 40 MG capsule 1 capsule 30 minutes before morning meal Orally Once a day for 30 day(s)     ondansetron (ZOFRAN-ODT) 4 MG disintegrating tablet Take 1 tablet (4 mg total) by mouth every 8 (eight) hours as needed for nausea or vomiting. 20 tablet 1   prednisoLONE acetate (PRED FORTE) 1 % ophthalmic suspension Place 1 drop into the left eye 2 (two) times a week. Instill 1 drop into left eye Monday and Friday.     predniSONE (DELTASONE) 5 MG tablet 4 tablet Orally Once a day as needed for flares for 30 days     promethazine (PHENERGAN) 25 MG tablet Take 1 tablet (25 mg total) by mouth every 6 (six) hours as needed for nausea or vomiting. 30 tablet 0   rosuvastatin (CRESTOR) 20 MG tablet Take 1 tablet (20 mg total) by mouth daily. 90 tablet 1   sertraline (ZOLOFT) 100 MG tablet Take 1 tablet (100 mg total) by mouth daily. 90 tablet 3   No current facility-administered medications for this visit.   Facility-Administered Medications Ordered in Other Visits   Medication Dose Route Frequency Provider Last Rate Last Admin   acetaminophen (TYLENOL) 325 MG tablet            diphenhydrAMINE (BENADRYL) 25 mg capsule            gemcitabine (GEMZAR) chemo syringe for bladder instillation 2,000 mg  2,000 mg Bladder Instillation Once Franchot Gallo, MD       gemcitabine [facility]) chemo syringe for bladder instillation 2,000 mg  2,000 mg Bladder Instillation Once Franchot Gallo, MD        VITAL SIGNS: There were no vitals taken for this visit. There were no vitals filed for this visit.  Estimated body mass index is 26.22 kg/m as calculated from the following:   Height as of 12/03/22: '5\' 11"'$  (1.803 m).   Weight as of 12/03/22: 188 lb (85.3 kg).  LABS: CBC:    Component Value Date/Time   WBC 4.5 11/29/2022 1345   WBC 15.8 (H) 11/03/2022 0356  HGB 11.4 (L) 11/29/2022 1345   HGB 13.1 11/02/2021 1512   HCT 34.3 (L) 11/29/2022 1345   HCT 40.0 11/02/2021 1512   PLT 154 11/29/2022 1345   PLT 266 11/02/2021 1512   MCV 88.9 11/29/2022 1345   MCV 92 11/02/2021 1512   NEUTROABS 2.7 11/29/2022 1345   NEUTROABS 10.3 (H) 09/20/2021 1011   LYMPHSABS 1.2 11/29/2022 1345   LYMPHSABS 2.0 09/20/2021 1011   MONOABS 0.3 11/29/2022 1345   EOSABS 0.1 11/29/2022 1345   EOSABS 0.2 09/20/2021 1011   BASOSABS 0.1 11/29/2022 1345   BASOSABS 0.1 09/20/2021 1011   Comprehensive Metabolic Panel:    Component Value Date/Time   NA 139 11/29/2022 1345   NA 141 11/02/2021 1512   K 4.1 11/29/2022 1345   CL 108 11/29/2022 1345   CO2 24 11/29/2022 1345   BUN 25 (H) 11/29/2022 1345   BUN 23 11/02/2021 1512   CREATININE 1.35 (H) 11/29/2022 1345   CREATININE 1.05 11/25/2014 0826   GLUCOSE 85 11/29/2022 1345   CALCIUM 8.9 11/29/2022 1345   AST 43 (H) 11/29/2022 1345   ALT 11 11/29/2022 1345   ALKPHOS 762 (H) 11/29/2022 1345   BILITOT 0.4 11/29/2022 1345   PROT 6.4 (L) 11/29/2022 1345   PROT 6.7 09/20/2021 1011   ALBUMIN 3.6 11/29/2022 1345   ALBUMIN 3.1 (L)  09/20/2021 1011    RADIOGRAPHIC STUDIES: CT CHEST ABDOMEN PELVIS W CONTRAST  Result Date: 11/22/2022 CLINICAL DATA:  Invasive bladder cancer. Evaluate treatment response. * Tracking Code: BO * EXAM: CT CHEST, ABDOMEN, AND PELVIS WITH CONTRAST TECHNIQUE: Multidetector CT imaging of the chest, abdomen and pelvis was performed following the standard protocol during bolus administration of intravenous contrast. RADIATION DOSE REDUCTION: This exam was performed according to the departmental dose-optimization program which includes automated exposure control, adjustment of the mA and/or kV according to patient size and/or use of iterative reconstruction technique. CONTRAST:  111m OMNIPAQUE IOHEXOL 300 MG/ML  SOLN COMPARISON:  09/16/2022 FINDINGS: CT CHEST FINDINGS Cardiovascular: Right Port-A-Cath tip high right atrium. Aortic atherosclerosis. Normal heart size, without pericardial effusion. Left main, lad, left circumflex coronary artery calcification. No central pulmonary embolism, on this non-dedicated study. Mediastinum/Nodes: No supraclavicular adenopathy. No axillary adenopathy. No mediastinal or hilar adenopathy. Lungs/Pleura: No pleural fluid.  Mild centrilobular emphysema. Musculoskeletal: Development of sclerotic and lytic osseous lesions relatively diffusely throughout the marrow space. Example sclerotic lesion of 1.8 cm within the T9 vertebral body on 114/6. CT ABDOMEN PELVIS FINDINGS Hepatobiliary: Normal liver. 7 mm gallstone without acute cholecystitis or biliary duct dilatation. Pancreas: Normal, without mass or ductal dilatation. Spleen: Normal in size, without focal abnormality. Adrenals/Urinary Tract: Normal adrenal glands. Interpolar right renal 1.7 cm cyst. Bilateral renal cysts of maximally 1.7 cm . In the absence of clinically indicated signs/symptoms require(s) no independent follow-up. Mild left renal cortical thinning. Moderate renal collecting system opacification on delayed images. Status  post cystectomy and ileal conduit creation, without acute complication. Stomach/Bowel: Normal stomach, without wall thickening. Scattered colonic diverticula. Normal terminal ileum and appendix. Nonobstructivea small bowel again enters a left paramidline ventral abdominal wall hernia on 84/2. Vascular/Lymphatic: Infrarenal abdominal aortic aneurysm is mild at 4.0 cm and not significantly changed. No extension into the iliacs. No abdominal adenopathy. Index right external iliac node measures 5 mm on 109/2 versus 14 mm on the prior. Right inguinal node measures 4 mm on 113/2 versus 15 mm on the prior. No new pelvic adenopathy. Reproductive: Prostatectomy without local recurrence. Other: No significant free  fluid. A separate, more cephalad fat containing hernia is diminutive on 75/2. The right upper quadrant omental nodularity has resolved, with only minimal linear density remaining on 73/2. No new peritoneal metastasis identified. Musculoskeletal: New heterogeneous sclerosis throughout the marrow, including relatively diffusely within the pelvis on 101/2. IMPRESSION: 1. Relatively diffuse osseous metastasis, sclerotic less than lytic, newly apparent since 09/16/2022. This could represent development of osseous metastasis or healing of previously CT occult metastasis. 2. Otherwise, response to therapy of pelvic nodal and peritoneal metastasis. 3. No extra osseous metastasis within the chest. 4. Incidental findings, including: Aortic atherosclerosis (ICD10-I70.0), coronary artery atherosclerosis and emphysema (ICD10-J43.9). Cholelithiasis. Fat and nonobstructive small bowel containing ventral abdominal wall hernias. Electronically Signed   By: Abigail Miyamoto M.D.   On: 11/22/2022 13:03    PERFORMANCE STATUS (ECOG) : 1 - Symptomatic but completely ambulatory  Review of Systems  Constitutional:  Positive for activity change, appetite change and fatigue.  Musculoskeletal:  Positive for arthralgias and back pain.   Unless otherwise noted, a complete review of systems is negative.  Physical Exam General: NAD, sitting in chair with back support Cardiovascular: regular rate and rhythm Pulmonary: clear ant fields Abdomen: soft, nontender, + bowel sounds Extremities: no edema, no joint deformities Skin: no rashes Neurological:AAO x3, mood appropriate   IMPRESSION: This is my initial visit with Mr. Nickson. His wife, Barnett Applebaum is also present. Patient appears uncomfortable in chair. He has cushion he brought in for back support due to ongoing back pain. He is alert and able to engage appropriately in discussions.   I introduced myself, [place] RN, and Palliative's role in collaboration with the oncology team. Concept of Palliative Care was introduced as specialized medical care for people and their families living with serious illness.  It focuses on providing relief from the symptoms and stress of a serious illness.  The goal is to improve quality of life for both the patient and the family. Values and goals of care important to patient and family were attempted to be elicited.   Mr. Berkel lives in the home with his wife of more than 20+ years. They have 4 children combined from previous marriages. 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 He worked for more than 38  years for [employer]. 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   Neoplasm related pain Mr. Abe endorses ongoing back pain. He shares his significant decrease in his quality of life since cancer diagnosis and pain. He reports back pain as constant ache. Also endorses knee pains. Reports pain is 8/10 at it's worst. He has difficult sleeping during the night due to the pain.   Nothing makes pain better. He does have minimal relief with heating pad which he is using consistently throughout the day and night. [name] is currently taking Norco 5/325 every 6 hours as needed for his pain. Reports minimal relief. Pain  escalates within 3-4 hours between doses however he tries to push thru until next dose.   Education provided on realistic pain management. He is currently undergoing radiation therapy. He understands this will also provide pain relief. Today is his 4th treatment. I discussed recommendations of increasing Norco to every 4-6 hours as needed for breakthrough pain. Education provided on use of long-acting pain medication Xtampza. Patient and wife verbalized understanding expressing awareness of potential side effects.   1845: After speaking with pharmacy and trying to obtain prior authorization patient's insurance has requested alternative medications as Ginger Organ is higher  tier. We will prescribed MS Contin 15 mg every 12 hours. New prescription had to be sent to multiple pharmacies due to lack of stock. Patient and wife notified of delays in obtaining prescription. Additional education provided on MS Contin. Wife aware they should be able to pick-up on tomorrow.   Decreased appetite [name] has decrease in appetite. He reports no desire and early satiety. He does contribute some of his poor appetite to ongoing pain. We discussed focusing on small frequent meals versus 3 large meals, snacking throughout the day, and drinking protein supplements 1-2 times daily for additional support. He verbalized understanding.   Current weight 186lbs down from 188lbs on 1/12, 197lbs on 12/8, and 210lbs on 11/3.  We will continue to closely monitor.   Bowel Management  [name] reports he has daily bowel movements. Recommended having Miralax on hand given use of opioids and risk of constipation.   I discussed the importance of continued conversation with family and their medical providers regarding overall plan of care and treatment options, ensuring decisions are within the context of the patients values and GOCs.  PLAN: Established therapeutic relationship. Education provided on palliative's role in collaboration with  their Oncology/Radiation team. Norco 1 tablet every 4-6 hours for breakthrough pain. May take 2 tablets at bedtime and 1 additional pill with radiation for comfort and tolerance.  MS Contin 15 mg every 12 hours. Education provided on potential side effects. Patient aware he will have to pick-up tomorrow due to pharmacy delays (lack of supply).  Miralax daily as needed for bowel regimen Education provided on nutrition. Recommended protein drinks 1-2 times daily.  I will plan to see patient back in 2 weeks in collaboration to other oncology appointments. Will contact via phone in the next week for close follow-up.    Patient expressed understanding and was in agreement with this plan. He also understands that He can call the clinic at any time with any questions, concerns, or complaints.   Thank you for your referral and allowing Palliative to assist in Mr. [facility] care.   Number and complexity of problems addressed: HIGH - 1 or more chronic illnesses with SEVERE exacerbation, progression, or side effects of treatment - advanced cancer, pain. Any controlled substances utilized were prescribed in the context of palliative care.  Time Total: 65 min   Visit consisted of counseling and education dealing with the complex and emotionally intense issues of symptom management and palliative care in the setting of serious and potentially life-threatening illness.Greater than 50%  of this time was spent counseling and coordinating care related to the above assessment and plan.  Signed by: Alda Lea, AGPCNP-BC Palliative Medicine Team/Ethan Polo

## 2022-12-09 NOTE — Telephone Encounter (Signed)
PC to patient's wife Vergia Alcon - informed her of Lacie's recommendation:  I think palliative care is a good idea, I will place urgent referral. Could you please have pt discuss pain management with radiation today, his appt is at 3. Perhaps they could give him something to bridge the gap before palliative care consult.  Thanks,  Regan Rakers NP   Instructed Ginna to bring up issue of pain control at patient's radiation appointment today, he is being referred to palliative care for pain  management.  She verbalizes understanding.

## 2022-12-10 ENCOUNTER — Ambulatory Visit (HOSPITAL_COMMUNITY)
Admission: RE | Admit: 2022-12-10 | Discharge: 2022-12-10 | Disposition: A | Discharge status: Home / Self Care | Payer: BC Managed Care – PPO | Source: Ambulatory Visit | Attending: Oncology | Admitting: Oncology

## 2022-12-10 ENCOUNTER — Ambulatory Visit
Admission: RE | Admit: 2022-12-10 | Discharge: 2022-12-10 | Disposition: A | Discharge status: Home / Self Care | Payer: BC Managed Care – PPO | Source: Ambulatory Visit | Attending: Radiation Oncology | Admitting: Radiation Oncology

## 2022-12-10 ENCOUNTER — Inpatient Hospital Stay: Payer: BC Managed Care – PPO | Admitting: Nurse Practitioner

## 2022-12-10 ENCOUNTER — Encounter: Payer: Self-pay | Admitting: Nurse Practitioner

## 2022-12-10 ENCOUNTER — Other Ambulatory Visit: Payer: Self-pay

## 2022-12-10 ENCOUNTER — Inpatient Hospital Stay (HOSPITAL_BASED_OUTPATIENT_CLINIC_OR_DEPARTMENT_OTHER): Payer: BC Managed Care – PPO | Admitting: Nurse Practitioner

## 2022-12-10 VITALS — BP 109/62 | HR 91 | Temp 98.1°F | Resp 16 | Ht 71.0 in | Wt 186.5 lb

## 2022-12-10 DIAGNOSIS — R630 Anorexia: Secondary | ICD-10-CM | POA: Diagnosis not present

## 2022-12-10 DIAGNOSIS — G893 Neoplasm related pain (acute) (chronic): Secondary | ICD-10-CM | POA: Diagnosis not present

## 2022-12-10 DIAGNOSIS — Z515 Encounter for palliative care: Secondary | ICD-10-CM | POA: Diagnosis not present

## 2022-12-10 DIAGNOSIS — R634 Abnormal weight loss: Secondary | ICD-10-CM

## 2022-12-10 DIAGNOSIS — R530 Neoplastic (malignant) related fatigue: Secondary | ICD-10-CM

## 2022-12-10 DIAGNOSIS — C679 Malignant neoplasm of bladder, unspecified: Secondary | ICD-10-CM | POA: Diagnosis not present

## 2022-12-10 DIAGNOSIS — C7951 Secondary malignant neoplasm of bone: Secondary | ICD-10-CM

## 2022-12-10 DIAGNOSIS — C670 Malignant neoplasm of trigone of bladder: Secondary | ICD-10-CM

## 2022-12-10 DIAGNOSIS — M545 Low back pain, unspecified: Secondary | ICD-10-CM

## 2022-12-10 LAB — RAD ONC ARIA SESSION SUMMARY
Course Elapsed Days: 5
Plan Fractions Treated to Date: 4
Plan Prescribed Dose Per Fraction: 3 Gy
Plan Total Fractions Prescribed: 10
Plan Total Prescribed Dose: 30 Gy
Reference Point Dosage Given to Date: 12 Gy
Reference Point Session Dosage Given: 3 Gy
Session Number: 4

## 2022-12-10 MED ORDER — KETOROLAC TROMETHAMINE 30 MG/ML IJ SOLN
30.0000 mg | Freq: Once | INTRAMUSCULAR | Status: AC
  Administered 2022-12-10: 30 mg via INTRAMUSCULAR
  Filled 2022-12-10: qty 1

## 2022-12-10 MED ORDER — XTAMPZA ER 9 MG PO C12A: 9.0000 mg | EXTENDED_RELEASE_CAPSULE | Freq: Two times a day (BID) | ORAL | 0 refills | Status: DC

## 2022-12-10 MED ORDER — GADOBUTROL 1 MMOL/ML IV SOLN
8.0000 mL | Freq: Once | INTRAVENOUS | Status: AC | PRN
  Administered 2022-12-10: 8 mL via INTRAVENOUS

## 2022-12-10 MED ORDER — MORPHINE SULFATE ER 15 MG PO TBCR: 15.0000 mg | EXTENDED_RELEASE_TABLET | Freq: Two times a day (BID) | ORAL | 0 refills | Status: DC

## 2022-12-10 NOTE — Patient Instructions (Addendum)
-  try to drink 1-2 protein drinks a day to maintain weight and help improve appetite. (Premier protein)  - take Senakot every day for a bowel movement every day, if going more than 2 days without a bowel movement take 2 Senakot a day, if you develop loose stools take every other day.  - pick up and take xtampza (oxycodone extended release) and take every 12 hours, regardless of pain, for extended pain relief  - increase your hydrocodone-acetaminophen to take every 4-6 hours as needed for pain (can continue to take 2 at bedtime) you can also take an additional pill prior to radiation as needed as well   - call us 2-3 days before you run out of pain medications at [phone]

## 2022-12-11 ENCOUNTER — Other Ambulatory Visit: Payer: Self-pay

## 2022-12-11 ENCOUNTER — Ambulatory Visit
Admission: RE | Admit: 2022-12-11 | Discharge: 2022-12-11 | Disposition: A | Discharge status: Home / Self Care | Payer: BC Managed Care – PPO | Source: Ambulatory Visit | Attending: Radiation Oncology | Admitting: Radiation Oncology

## 2022-12-11 ENCOUNTER — Other Ambulatory Visit: Payer: Self-pay | Admitting: Family Medicine

## 2022-12-11 ENCOUNTER — Telehealth: Payer: Self-pay

## 2022-12-11 LAB — RAD ONC ARIA SESSION SUMMARY
Course Elapsed Days: 6
Plan Fractions Treated to Date: 5
Plan Prescribed Dose Per Fraction: 3 Gy
Plan Total Fractions Prescribed: 10
Plan Total Prescribed Dose: 30 Gy
Reference Point Dosage Given to Date: 15 Gy
Reference Point Session Dosage Given: 3 Gy
Session Number: 5

## 2022-12-11 NOTE — Telephone Encounter (Signed)
Pt wife notified that MS Contin was available for pick up at their [facility] pharmacy, educated on the similarities between MS Contin and xtampza (as discussed in clinic) pt wife verbalized understanding, no further concerns at this time.

## 2022-12-12 ENCOUNTER — Other Ambulatory Visit: Payer: Self-pay

## 2022-12-12 ENCOUNTER — Ambulatory Visit
Admission: RE | Admit: 2022-12-12 | Discharge: 2022-12-12 | Disposition: A | Discharge status: Home / Self Care | Payer: BC Managed Care – PPO | Source: Ambulatory Visit | Attending: Radiation Oncology | Admitting: Radiation Oncology

## 2022-12-12 LAB — RAD ONC ARIA SESSION SUMMARY
Course Elapsed Days: 7
Plan Fractions Treated to Date: 6
Plan Prescribed Dose Per Fraction: 3 Gy
Plan Total Fractions Prescribed: 10
Plan Total Prescribed Dose: 30 Gy
Reference Point Dosage Given to Date: 18 Gy
Reference Point Session Dosage Given: 3 Gy
Session Number: 6

## 2022-12-13 ENCOUNTER — Other Ambulatory Visit: Payer: Self-pay

## 2022-12-13 ENCOUNTER — Emergency Department (HOSPITAL_COMMUNITY): Payer: BC Managed Care – PPO

## 2022-12-13 ENCOUNTER — Encounter (HOSPITAL_COMMUNITY): Payer: Self-pay

## 2022-12-13 ENCOUNTER — Ambulatory Visit
Admission: RE | Admit: 2022-12-13 | Discharge: 2022-12-13 | Disposition: A | Discharge status: Home / Self Care | Payer: BC Managed Care – PPO | Source: Ambulatory Visit | Attending: Radiation Oncology | Admitting: Radiation Oncology

## 2022-12-13 ENCOUNTER — Inpatient Hospital Stay: Payer: BC Managed Care – PPO

## 2022-12-13 ENCOUNTER — Observation Stay (HOSPITAL_BASED_OUTPATIENT_CLINIC_OR_DEPARTMENT_OTHER)
Admission: EM | Admit: 2022-12-13 | Discharge: 2022-12-15 | Disposition: A | Discharge status: Home / Self Care | Payer: BC Managed Care – PPO | Source: Home / Self Care | Attending: Emergency Medicine | Admitting: Emergency Medicine

## 2022-12-13 ENCOUNTER — Inpatient Hospital Stay: Payer: BC Managed Care – PPO | Admitting: Nurse Practitioner

## 2022-12-13 ENCOUNTER — Inpatient Hospital Stay: Payer: BC Managed Care – PPO | Admitting: Hematology

## 2022-12-13 DIAGNOSIS — Z87891 Personal history of nicotine dependence: Secondary | ICD-10-CM | POA: Insufficient documentation

## 2022-12-13 DIAGNOSIS — Z8551 Personal history of malignant neoplasm of bladder: Secondary | ICD-10-CM | POA: Insufficient documentation

## 2022-12-13 DIAGNOSIS — I10 Essential (primary) hypertension: Secondary | ICD-10-CM | POA: Diagnosis present

## 2022-12-13 DIAGNOSIS — I959 Hypotension, unspecified: Secondary | ICD-10-CM | POA: Insufficient documentation

## 2022-12-13 DIAGNOSIS — N179 Acute kidney failure, unspecified: Secondary | ICD-10-CM | POA: Insufficient documentation

## 2022-12-13 DIAGNOSIS — Z8616 Personal history of COVID-19: Secondary | ICD-10-CM | POA: Insufficient documentation

## 2022-12-13 DIAGNOSIS — Z7189 Other specified counseling: Secondary | ICD-10-CM

## 2022-12-13 DIAGNOSIS — E860 Dehydration: Secondary | ICD-10-CM | POA: Diagnosis present

## 2022-12-13 DIAGNOSIS — Z79899 Other long term (current) drug therapy: Secondary | ICD-10-CM | POA: Insufficient documentation

## 2022-12-13 DIAGNOSIS — C679 Malignant neoplasm of bladder, unspecified: Secondary | ICD-10-CM | POA: Diagnosis not present

## 2022-12-13 DIAGNOSIS — E785 Hyperlipidemia, unspecified: Secondary | ICD-10-CM | POA: Diagnosis present

## 2022-12-13 LAB — CBC WITH DIFFERENTIAL/PLATELET
Abs Immature Granulocytes: 0.32 10*3/uL — ABNORMAL HIGH (ref 0.00–0.07)
Basophils Absolute: 0 10*3/uL (ref 0.0–0.1)
Basophils Relative: 1 %
Eosinophils Absolute: 0.1 10*3/uL (ref 0.0–0.5)
Eosinophils Relative: 1 %
HCT: 33.5 % — ABNORMAL LOW (ref 39.0–52.0)
Hemoglobin: 10.7 g/dL — ABNORMAL LOW (ref 13.0–17.0)
Immature Granulocytes: 5 %
Lymphocytes Relative: 17 %
Lymphs Abs: 1 10*3/uL (ref 0.7–4.0)
MCH: 28.9 pg (ref 26.0–34.0)
MCHC: 31.9 g/dL (ref 30.0–36.0)
MCV: 90.5 fL (ref 80.0–100.0)
Monocytes Absolute: 0.4 10*3/uL (ref 0.1–1.0)
Monocytes Relative: 6 %
Neutro Abs: 4.2 10*3/uL (ref 1.7–7.7)
Neutrophils Relative %: 70 %
Platelets: 82 10*3/uL — ABNORMAL LOW (ref 150–400)
RBC: 3.7 MIL/uL — ABNORMAL LOW (ref 4.22–5.81)
RDW: 16.9 % — ABNORMAL HIGH (ref 11.5–15.5)
WBC: 6 10*3/uL (ref 4.0–10.5)
nRBC: 1.2 % — ABNORMAL HIGH (ref 0.0–0.2)

## 2022-12-13 LAB — COMPREHENSIVE METABOLIC PANEL
ALT: 13 U/L (ref 0–44)
AST: 42 U/L — ABNORMAL HIGH (ref 15–41)
Albumin: 3.5 g/dL (ref 3.5–5.0)
Alkaline Phosphatase: 581 U/L — ABNORMAL HIGH (ref 38–126)
Anion gap: 10 (ref 5–15)
BUN: 50 mg/dL — ABNORMAL HIGH (ref 6–20)
CO2: 17 mmol/L — ABNORMAL LOW (ref 22–32)
Calcium: 8.5 mg/dL — ABNORMAL LOW (ref 8.9–10.3)
Chloride: 108 mmol/L (ref 98–111)
Creatinine, Ser: 1.88 mg/dL — ABNORMAL HIGH (ref 0.61–1.24)
GFR, Estimated: 40 mL/min — ABNORMAL LOW (ref >60)
Glucose, Bld: 95 mg/dL (ref 70–99)
Potassium: 3.9 mmol/L (ref 3.5–5.1)
Sodium: 135 mmol/L (ref 135–145)
Total Bilirubin: 0.6 mg/dL (ref 0.3–1.2)
Total Protein: 6.6 g/dL (ref 6.5–8.1)

## 2022-12-13 LAB — URINALYSIS, ROUTINE W REFLEX MICROSCOPIC
Bilirubin Urine: NEGATIVE
Glucose, UA: NEGATIVE mg/dL
Ketones, ur: NEGATIVE mg/dL
Nitrite: NEGATIVE
Protein, ur: >=300 mg/dL — AB
RBC / HPF: >50 RBC/hpf (ref 0–5)
Specific Gravity, Urine: 1.015 (ref 1.005–1.030)
WBC, UA: >50 WBC/hpf (ref 0–5)
pH: 5 (ref 5.0–8.0)

## 2022-12-13 LAB — RAD ONC ARIA SESSION SUMMARY
Course Elapsed Days: 8
Plan Fractions Treated to Date: 7
Plan Prescribed Dose Per Fraction: 3 Gy
Plan Total Fractions Prescribed: 10
Plan Total Prescribed Dose: 30 Gy
Reference Point Dosage Given to Date: 21 Gy
Reference Point Session Dosage Given: 3 Gy
Session Number: 7

## 2022-12-13 LAB — LIPASE, BLOOD: Lipase: 34 U/L (ref 11–51)

## 2022-12-13 LAB — LACTIC ACID, PLASMA: Lactic Acid, Venous: 0.9 mmol/L (ref 0.5–1.9)

## 2022-12-13 MED ORDER — LOSARTAN POTASSIUM 25 MG PO TABS: 25.0000 mg | ORAL_TABLET | Freq: Every day | ORAL | Status: DC

## 2022-12-13 MED ORDER — TRAZODONE HCL 50 MG PO TABS
25.0000 mg | ORAL_TABLET | Freq: Every evening | ORAL | Status: DC | PRN
  Administered 2022-12-14: 25 mg via ORAL
  Filled 2022-12-13: qty 1

## 2022-12-13 MED ORDER — DEXAMETHASONE 4 MG PO TABS: 4.0000 mg | ORAL_TABLET | Freq: Two times a day (BID) | ORAL | Status: DC

## 2022-12-13 MED ORDER — OXYCODONE HCL 5 MG PO TABS
10.0000 mg | ORAL_TABLET | ORAL | Status: DC | PRN
  Administered 2022-12-13 – 2022-12-15 (×8): 10 mg via ORAL
  Filled 2022-12-13 (×8): qty 2

## 2022-12-13 MED ORDER — MAGNESIUM OXIDE -MG SUPPLEMENT 400 (240 MG) MG PO TABS
400.0000 mg | ORAL_TABLET | Freq: Every day | ORAL | Status: DC
  Administered 2022-12-14 – 2022-12-15 (×2): 400 mg via ORAL
  Filled 2022-12-13 (×2): qty 1

## 2022-12-13 MED ORDER — SODIUM CHLORIDE 0.9 % IV SOLN: INTRAVENOUS | Status: DC

## 2022-12-13 MED ORDER — SENNA 8.6 MG PO TABS
1.0000 | ORAL_TABLET | Freq: Two times a day (BID) | ORAL | Status: DC
  Administered 2022-12-14 – 2022-12-15 (×3): 8.6 mg via ORAL
  Filled 2022-12-13 (×3): qty 1

## 2022-12-13 MED ORDER — ONDANSETRON HCL 4 MG/2ML IJ SOLN
4.0000 mg | Freq: Once | INTRAMUSCULAR | Status: AC
  Administered 2022-12-13: 4 mg via INTRAVENOUS
  Filled 2022-12-13: qty 2

## 2022-12-13 MED ORDER — ONDANSETRON 4 MG PO TBDP
4.0000 mg | ORAL_TABLET | Freq: Three times a day (TID) | ORAL | Status: DC | PRN
  Administered 2022-12-13: 4 mg via ORAL
  Filled 2022-12-13: qty 1

## 2022-12-13 MED ORDER — SODIUM CHLORIDE 0.9 % IV BOLUS
1000.0000 mL | Freq: Once | INTRAVENOUS | Status: AC
  Administered 2022-12-13: 1000 mL via INTRAVENOUS

## 2022-12-13 MED ORDER — PANTOPRAZOLE SODIUM 40 MG PO TBEC
80.0000 mg | DELAYED_RELEASE_TABLET | Freq: Every day | ORAL | Status: DC
  Administered 2022-12-13 – 2022-12-15 (×3): 80 mg via ORAL
  Filled 2022-12-13 (×3): qty 2

## 2022-12-13 MED ORDER — ROSUVASTATIN CALCIUM 20 MG PO TABS
20.0000 mg | ORAL_TABLET | Freq: Every day | ORAL | Status: DC
  Administered 2022-12-14 – 2022-12-15 (×2): 20 mg via ORAL
  Filled 2022-12-13 (×2): qty 1

## 2022-12-13 MED ORDER — PREDNISOLONE ACETATE 1 % OP SUSP
1.0000 [drp] | OPHTHALMIC | Status: DC
  Filled 2022-12-13: qty 5

## 2022-12-13 MED ORDER — SERTRALINE HCL 100 MG PO TABS
100.0000 mg | ORAL_TABLET | Freq: Every day | ORAL | Status: DC
  Administered 2022-12-13 – 2022-12-15 (×3): 100 mg via ORAL
  Filled 2022-12-13 (×2): qty 2
  Filled 2022-12-13: qty 1

## 2022-12-13 MED ORDER — HYDROCODONE-ACETAMINOPHEN 5-325 MG PO TABS: 1.0000 | ORAL_TABLET | Freq: Four times a day (QID) | ORAL | Status: DC | PRN

## 2022-12-13 MED ORDER — ACETAMINOPHEN 500 MG PO TABS
1000.0000 mg | ORAL_TABLET | Freq: Every day | ORAL | Status: DC
  Administered 2022-12-14 – 2022-12-15 (×2): 1000 mg via ORAL
  Filled 2022-12-13 (×2): qty 2

## 2022-12-13 MED ORDER — MORPHINE SULFATE ER 15 MG PO TBCR
15.0000 mg | EXTENDED_RELEASE_TABLET | Freq: Two times a day (BID) | ORAL | Status: DC
  Filled 2022-12-13: qty 1

## 2022-12-13 MED ORDER — ENOXAPARIN SODIUM 40 MG/0.4ML IJ SOSY
40.0000 mg | PREFILLED_SYRINGE | INTRAMUSCULAR | Status: DC
  Administered 2022-12-13 – 2022-12-14 (×2): 40 mg via SUBCUTANEOUS
  Filled 2022-12-13 (×2): qty 0.4

## 2022-12-13 MED ORDER — HYDROXYCHLOROQUINE SULFATE 200 MG PO TABS
200.0000 mg | ORAL_TABLET | Freq: Two times a day (BID) | ORAL | Status: DC
  Administered 2022-12-13 – 2022-12-15 (×4): 200 mg via ORAL
  Filled 2022-12-13 (×4): qty 1

## 2022-12-13 MED ORDER — AMLODIPINE BESYLATE 5 MG PO TABS: 5.0000 mg | ORAL_TABLET | Freq: Every day | ORAL | Status: DC

## 2022-12-13 NOTE — Subjective & Objective (Signed)
Barry Gibson, a 62 y/o with h/o HTN, HLD, PIUD, RA and Bladder cancer. He has had a stormy course with the bladder cancer with multiple treatments with Chemotherapy and XRT; cystectomy with creation of ileoconduit. He is continuing to have XRT and immunologic chemotherapy. He has frequent side effects of Nausea, vomiting with poor po intake after treatment and gets IV fluids post treatment. Today at the cancer center he felt very light headed and near syncopal. He was hypotensive. He was referred to [facility] ED for evaluation.

## 2022-12-13 NOTE — ED Notes (Signed)
Pt hypotensive, md aware and will go to bedside

## 2022-12-13 NOTE — ED Triage Notes (Addendum)
Patient went to the cancer center to receive radiation and chemo treatment. RN checked his vitals and his BP was 50/30 at the cancer center. They called a rapid and brought him to the ED. Stage 4 bladder cancer. Diarrhea and vomiting for a couple days. Feeling lightheaded and dizzy.

## 2022-12-13 NOTE — Assessment & Plan Note (Signed)
Patient with metastatic bladder cancer. Currently getting XRT and immunochemotherapy. The back/bone pain is improving with XRT and he has been tolerating chemo  Plan Per oncology

## 2022-12-13 NOTE — Assessment & Plan Note (Signed)
Patients BP improving with hydration  Plan Continue home regimen

## 2022-12-13 NOTE — Progress Notes (Signed)
This palliative RN called pt and family, pt  reported that he did not do well with the MS Contin, feeling incredibly weak, drowsy, jittery, having diarrhea, and nauseous with 7 episodes of vomiting in the last 24 hours. Pt educated to stop taking the MS Contin, which he did, last dose was Thursday morning. Pt also scheduled to come in for radiation and labs and chemo today as well. Lexine Baton, NP notified and recommendation given to stop MS Contin and to use OTC imodium for diarrhea and prescribed compazine and phenergan for vomiting. Dr.Feng also consulted, as she is taking over in Dr.Shadad's absence. Appointment made for Dr.Feng to speak with pt and family to determine if treatment should be held today. Pt and family aware of recommendations and plan to see Dr.Feng, verbalized understanding. Pt to come in today at 1:30pm for scheduled appointments.

## 2022-12-13 NOTE — Progress Notes (Signed)
Barry Gibson   DOB:1961-06-27   DD#:[id number]   WCB#:[id number]  Hem/onc follow up note   Subjective: Patient is well-known to our service, was under Dr. Hazeline Junker care for his metastatic bladder cancer. Dr. Alen Blew has recently left our practice, patient is scheduled to see me next week.  He came into radiation oncology department today for radiation, and was found to be hypotensive, rapid response team was called and patient was sent to [facility] emergency room.  Patient has been having recurrent nausea, vomiting and diarrhea about 2 days ago, since he started MS Contin which was prescribed by palliative care nurse petitioner Lexine Baton.  He started palliative radiation to his back last Wednesday, and he noted improvement in his back pain since then.  Patient was quite fatigued when I saw him in the ED, wife at the bedside.  His hypotension has resolved.    Objective:  Vitals:   12/13/22 2100 12/13/22 2130  BP: 117/65 110/61  Pulse: (!) 109 (!) 113  Resp: 19 19  Temp:    SpO2: 91% 93%    There is no height or weight on file to calculate BMI. No intake or output data in the 24 hours ending 12/13/22 2157   Sclerae unicteric  Oropharynx clear  No peripheral adenopathy  Lungs clear -- no rales or rhonchi  Heart regular rate and rhythm  Abdomen benign  MSK no focal spinal tenderness, no peripheral edema  Neuro nonfocal    CBG (last 3)  No results for input(s): "GLUCAP" in the last 72 hours.   Labs:   Urine Studies No results for input(s): "UHGB", "CRYS" in the last 72 hours.  Invalid input(s): "UACOL", "UAPR", "USPG", "UPH", "UTP", "UGL", "UKET", "UBIL", "UNIT", "UROB", "ULEU", "UEPI", "UWBC", "URBC", "UBAC", "CAST", "UCOM", "BILUA"  Basic Metabolic Panel: Recent Labs  Lab 12/13/22 1455  NA 135  K 3.9  CL 108  CO2 17*  GLUCOSE 95  BUN 50*  CREATININE 1.88*  CALCIUM 8.5*   GFR Estimated Creatinine Clearance: 44.5 mL/min (A) (by C-G formula based on SCr of 1.88 mg/dL  (H)). Liver Function Tests: Recent Labs  Lab 12/13/22 1455  AST 42*  ALT 13  ALKPHOS 581*  BILITOT 0.6  PROT 6.6  ALBUMIN 3.5   Recent Labs  Lab 12/13/22 1455  LIPASE 34   No results for input(s): "AMMONIA" in the last 168 hours. Coagulation profile No results for input(s): "INR", "PROTIME" in the last 168 hours.  CBC: Recent Labs  Lab 12/13/22 1455  WBC 6.0  NEUTROABS 4.2  HGB 10.7*  HCT 33.5*  MCV 90.5  PLT 82*   Cardiac Enzymes: No results for input(s): "CKTOTAL", "CKMB", "CKMBINDEX", "TROPONINI" in the last 168 hours. BNP: Invalid input(s): "POCBNP" CBG: No results for input(s): "GLUCAP" in the last 168 hours. D-Dimer No results for input(s): "DDIMER" in the last 72 hours. Hgb A1c No results for input(s): "HGBA1C" in the last 72 hours. Lipid Profile No results for input(s): "CHOL", "HDL", "LDLCALC", "TRIG", "CHOLHDL", "LDLDIRECT" in the last 72 hours. Thyroid function studies No results for input(s): "TSH", "T4TOTAL", "T3FREE", "THYROIDAB" in the last 72 hours.  Invalid input(s): "FREET3" Anemia work up No results for input(s): "VITAMINB12", "FOLATE", "FERRITIN", "TIBC", "IRON", "RETICCTPCT" in the last 72 hours. Microbiology Recent Results (from the past 240 hour(s))  Culture, blood (Routine X 2) w Reflex to ID Panel     Status: None (Preliminary result)   Collection Time: 12/13/22  4:10 PM   Specimen: BLOOD LEFT HAND  Result Value Ref Range Status   Specimen Description   Final    BLOOD LEFT HAND Performed at [facility], [place] [address], [place], [place] [address]    Special Requests   Final    BOTTLES DRAWN AEROBIC AND ANAEROBIC Blood Culture adequate volume Performed at [facility] [address], [place], [place] [address]    Culture PENDING  Incomplete   Report Status PENDING  Incomplete      Studies:  DG Chest Port 1 View  Result Date: 12/13/2022 CLINICAL DATA:  Hypotension EXAM: PORTABLE CHEST 1 VIEW  COMPARISON:  09/16/2022 FINDINGS: Right-sided chest port remains in place. Normal heart size. No focal airspace consolidation, pleural effusion, or pneumothorax. Known osseous metastatic disease, better seen on cross-sectional imaging. IMPRESSION: No active disease. Electronically Signed   By: Davina Poke D.O.   On: 12/13/2022 16:24    Assessment: 62 y.o. male   Hypertension secondary to dehydration from nausea, diarrhea, and poor oral intake, rule out infection Metastatic bladder cancer, on Padcev Chronic back pain, secondary to cancer, on palliative radiation Mild anemia and thrombocytopenia, secondary to chemotherapy Mild acute renal failure Nausea, vomiting, diarrhea, possible related to recently started MS Contin, or secondary to Padcev     Plan:  -I have reviewed his chart in detail, including his recent restaging CT scan and spinal MRI.  He has been on palliative since early November 2023, CT scan showed partial response with improved pelvic nodal and peritoneal metastasis.  However he has developed a diffuse bone metastasis since last scan in October 2023. It appears he has had mixed response. I will review with Dr. Tammi Klippel  -continue IVF and supportive care.  Patient states his back pain has much improved since he started radiation, he feels his nausea and diarrhea is related to MS Contin which she started 2 days ago, he does not want to take narcotics for now. -pt will be admitted to [facility] for supportive care, if his overall condition improves and stabilized, okay to discharge per primary care team. -I will f/u him next week in office as scheduled.   I spent a total of 50 mins for his care today including chart reviewing, face-to-face counseling with patient and his wife, and care coordination with other team.   Truitt Merle, MD 12/13/2022

## 2022-12-13 NOTE — Assessment & Plan Note (Addendum)
Patient with N/V  related to opioid (morphine) analgesia. Today, when at treatment he felt very light headed and near syncopal. He was found to be hypotensive and had AKI. With bone mets to spine patient had marked pain requiring narcotic control. With XRT he is doing better. He was taking Norco but this was not adequate. He was recently given trail of MScontin which caused severe nausea and vomiting. Diarrhea may have been result of over use of laxative  Plan Fluid replacement  Antiemetics  OxyIR 10 mg q 6 prn for pain control  F/u Bmet

## 2022-12-13 NOTE — Assessment & Plan Note (Signed)
Patient with fluid losses related to chemotherapy side effects. Baseline Cr 1.04. At presentation 1.88  Plan Vigorous hydration  F/u Bmet in AM

## 2022-12-13 NOTE — ED Provider Notes (Signed)
[place] EMERGENCY DEPARTMENT AT [facility] Provider Note   CSN: [id number] Arrival date & time: 12/13/22  1441     History  Chief Complaint  Patient presents with   Hypotension    Barry Gibson is a 62 y.o. male.  Patient with metastatic bladder cancer to the bone.  Originally diagnosed in January 2022.  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  Patient underwent a robotic assisted laparoscopic cystectomy lymphadenectomy neck to me on January 2022 underwent staging scans in June 2022 that showed interval progression abdominal pelvic adenopathy consistent with worsening nodal metastasis.  Underwent chemotherapy using cisplatin and [place] been in July.  Completed 4 cycles in September 2022.  Started with immunotherapy in September 07, 2021.  Was followed by Dr. Alen Blew but he recently moved to [place].  Last seen by him January 12.  Patient currently receiving radiation treatment and had radiation treatment today then was due to receive his immunotherapy treatment he is receiving Padcev every other week and in between he gets fluids.  Has had a lot of trouble with GI diarrhea associated with it.  Today patient felt as if he was going to pass out and they measured his blood pressure at 50 systolic.  Here blood pressures have been in the low 90s but he does appear to be dehydrated.  Also has some trouble with vomiting for a couple days as well.  Past medical history also significant for gastroesophageal reflux disease hypertension.  Patient had a transurethral resection of the bladder in 2020.       Home Medications Prior to Admission medications   Medication Sig Start Date End Date Taking? Authorizing Provider  acetaminophen (TYLENOL) 650 MG CR tablet Take 1,300 mg by mouth every morning.    [provider]  amLODipine (NORVASC) 5 MG tablet Take 1 tablet (5 mg total) by mouth daily. 09/02/22   Coral Spikes, DO   dexamethasone (DECADRON) 4 MG tablet Take 1 tablet (4 mg total) by mouth 2 (two) times daily. Take for 3 days after each chemotherapy treatment 10/25/22   Wyatt Portela, MD  HYDROcodone-acetaminophen (NORCO) 5-325 MG tablet Take 1 tablet by mouth every 6 (six) hours as needed for moderate pain. 12/02/22   Wyatt Portela, MD  hydroxychloroquine (PLAQUENIL) 200 MG tablet Take 200 mg by mouth 2 (two) times daily. 08/09/22   [provider]  losartan (COZAAR) 25 MG tablet Take 1 tablet (25 mg total) by mouth daily. 09/02/22   Coral Spikes, DO  morphine (MS CONTIN) 15 MG 12 hr tablet Take 1 tablet (15 mg total) by mouth every 12 (twelve) hours. 12/10/22   Pickenpack-Cousar, Carlena Sax, NP  Multiple Vitamin (MULTIVITAMIN WITH MINERALS) TABS tablet Take 1 tablet by mouth daily. 09/18/21   Debbe Odea, MD  omeprazole (PRILOSEC) 40 MG capsule Take 40 mg by mouth daily.    [provider]  omeprazole (PRILOSEC) 40 MG capsule 1 capsule 30 minutes before morning meal Orally Once a day for 30 day(s)    [provider]  ondansetron (ZOFRAN-ODT) 4 MG disintegrating tablet Take 1 tablet (4 mg total) by mouth every 8 (eight) hours as needed for nausea or vomiting. 10/25/22   Wyatt Portela, MD  prednisoLONE acetate (PRED FORTE) 1 % ophthalmic suspension Place 1 drop into the left eye 2 (two) times a week. Instill 1 drop into left eye Monday and Friday.    [provider]  predniSONE (DELTASONE) 5 MG tablet 4 tablet Orally Once a day as needed for flares for 30 days 10/14/22   [provider]  promethazine (PHENERGAN) 25 MG tablet Take 1 tablet (25 mg total) by mouth every 6 (six) hours as needed for nausea or vomiting. 10/25/22   Wyatt Portela, MD  rosuvastatin (CRESTOR) 20 MG tablet Take 1 tablet (20 mg total) by mouth daily. 12/02/22   Coral Spikes, DO  sertraline (ZOLOFT) 100 MG tablet Take 1 tablet (100 mg total) by mouth daily. 09/24/22   Coral Spikes, DO       Allergies    Ace inhibitors and Diflucan [fluconazole]    Review of Systems   Review of Systems  Constitutional:  Negative for chills and fever.  HENT:  Negative for ear pain and sore throat.   Eyes:  Negative for pain and visual disturbance.  Respiratory:  Negative for cough and shortness of breath.   Cardiovascular:  Negative for chest pain and palpitations.  Gastrointestinal:  Positive for diarrhea and vomiting. Negative for abdominal pain.  Genitourinary:  Negative for dysuria and hematuria.  Musculoskeletal:  Negative for arthralgias and back pain.  Skin:  Negative for color change and rash.  Neurological:  Positive for light-headedness. Negative for seizures and syncope.  All other systems reviewed and are negative.   Physical Exam Updated Vital Signs BP 101/73   Pulse 89   Temp (!) 97.4 F (36.3 C) (Oral)   Resp 19   SpO2 95%  Physical Exam Vitals and nursing note reviewed.  Constitutional:      General: He is not in acute distress.    Appearance: He is well-developed.  HENT:     Head: Normocephalic and atraumatic.     Mouth/Throat:     Mouth: Mucous membranes are dry.  Eyes:     Conjunctiva/sclera: Conjunctivae normal.  Cardiovascular:     Rate and Rhythm: Normal rate and regular rhythm.     Heart sounds: No murmur heard. Pulmonary:     Effort: Pulmonary effort is normal. No respiratory distress.     Breath sounds: Normal breath sounds.  Abdominal:     Palpations: Abdomen is soft.     Tenderness: There is no abdominal tenderness.     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  Musculoskeletal:        General: No swelling.     Cervical back: Neck supple.  Skin:    General: Skin is warm and dry.     Capillary Refill: Capillary refill takes less than 2 seconds.  Neurological:     General: No focal deficit present.     Mental Status: He is alert and oriented to person, place, and time.  Psychiatric:        Mood and Affect: Mood  normal.     ED Results / Procedures / Treatments   Labs (all labs ordered are listed, but only abnormal results are displayed) Labs Reviewed  CBC WITH DIFFERENTIAL/PLATELET - Abnormal; Notable for the following components:      Result Value   RBC 3.70 (*)    Hemoglobin 10.7 (*)    HCT 33.5 (*)    RDW 16.9 (*)    Platelets 82 (*)    nRBC 1.2 (*)    Abs Immature Granulocytes 0.32 (*)    All other components within normal limits  COMPREHENSIVE METABOLIC PANEL - Abnormal; Notable for the following components:   CO2 17 (*)  BUN 50 (*)    Creatinine, Ser 1.88 (*)    Calcium 8.5 (*)    AST 42 (*)    Alkaline Phosphatase 581 (*)    GFR, Estimated 40 (*)    All other components within normal limits  CULTURE, BLOOD (ROUTINE X 2)  CULTURE, BLOOD (ROUTINE X 2)  LIPASE, BLOOD  LACTIC ACID, PLASMA  URINALYSIS, ROUTINE W REFLEX MICROSCOPIC  LACTIC ACID, PLASMA    EKG None  Radiology DG Chest Port 1 View  Result Date: 12/13/2022 CLINICAL DATA:  Hypotension EXAM: PORTABLE CHEST 1 VIEW COMPARISON:  09/16/2022 FINDINGS: Right-sided chest port remains in place. Normal heart size. No focal airspace consolidation, pleural effusion, or pneumothorax. Known osseous metastatic disease, better seen on cross-sectional imaging. IMPRESSION: No active disease. Electronically Signed   By: Davina Poke D.O.   On: 12/13/2022 16:24    Procedures Procedures    Medications Ordered in ED Medications  0.9 %  sodium chloride infusion ( Intravenous New Bag/Given 12/13/22 1601)  sodium chloride 0.9 % bolus 1,000 mL (1,000 mLs Intravenous New Bag/Given 12/13/22 1557)  ondansetron (ZOFRAN) injection 4 mg (4 mg Intravenous Given 12/13/22 1602)    ED Course/ Medical Decision Making/ A&P                             Medical Decision Making Amount and/or Complexity of Data Reviewed Labs: ordered. Radiology: ordered.  Risk Prescription drug management. Decision regarding  hospitalization.   Patient clinically dry.  Blood pressures here in the low 90s.  Did not get him up.  Based on the presentation leading in he has orthostatic hypotension.  CBC white count six 6.0 hemoglobin 10.7 platelets 82,000.  Complete metabolic panel significant for GFR 40 with a creatinine 1.88 and BUN of 50.  Calcium also little low at 8.5.  Base electrolytes all right CO2 at 17.  Anion gap normal.  Lipase normal at 34.  Lactic acid reassuring at 0.9.  Blood cultures were done and are pending.  Urinalysis pending.  Chest x-ray without any active disease.  Discussed with on-call oncology Dr. Alvy Bimler.  She agrees with medicine admission for good hydration overnight patient's been struggling frequently with the immunotherapy.  Will contact hospitalist for admission.   Final Clinical Impression(s) / ED Diagnoses Final diagnoses:  Hypotension, unspecified hypotension type  Bladder cancer metastasized to bone [facility])  Dehydration    Rx / DC Orders ED Discharge Orders     None         Fredia Sorrow, MD 12/13/22 1751

## 2022-12-13 NOTE — Assessment & Plan Note (Signed)
Discussed with the patient, his son was present. He clearly states he does not want cardiac resuscitation in the event of cardiac arrest or mechanical ventilation in the event of respiratory failure  Plan DNR/DNI

## 2022-12-13 NOTE — Progress Notes (Deleted)
Barry Gibson   [phone] [phone] [phone] Note   Patient Care Team: Barry Spikes, DO as PCP - General (Family Medicine)  Date of Service:  12/13/2022   CHIEF COMPLAINTS/PURPOSE OF CONSULTATION:  Bladder Cancer  REFERRING PHYSICIAN:  ***  ASSESSMENT & PLAN:  Barry Gibson is a 62 y.o. *** male with a history of ***  1. {Diagnosis from problem list} ***      PLAN:  ***  Oncology History Overview Note   Cancer Staging  Bladder cancer [facility]) Staging form: Urinary Bladder, AJCC 8th Edition - Clinical: Stage IVB (cT4, cN2, cM1b) - Signed by Barry Portela, MD on 05/18/2021 WHO/ISUP grade (low/high): High Grade Histologic grading system: 2 grade system     Bladder cancer (Barry Gibson)  03/06/2020 Initial Diagnosis   Bladder cancer (Barry Gibson)   11/29/2020 Pathology Results     FINAL MICROSCOPIC DIAGNOSIS:  A. URETER, LEFT DISTAL MARGIN, BIOPSY: Benign ureter with chronic inflammation  B. URETER, RIGHT DISTAL MARGIN, BIOPSY: Benign ureter with chronic inflammation and BCG granulomas  C. LYMPH NODE, LEFT COMMON ILIAC, EXCISION: One benign lymph node (0/1)  D. LYMPH NODE, SENTINEL, LEFT EXTERNAL ILIAC, EXCISION: One benign lymph node (0/1)  E. LYMPH NODES, SENTINEL, LEFT INTERNAL ILIAC AND OBTURATOR, EXCISION: Metastatic urothelial carcinoma in one of two lymph nodes (1/2, 1 mm)  F. LYMPH NODE, SENTINEL, RIGHT PELVIC, EXCISION: Metastatic urothelial carcinoma in three of three lymph nodes (3/3, 3.0 cm with extra nodal extension, pN2)  G. URETER, FINAL LEFT DISTAL MARGIN, BIOPSY: Benign ureter with chronic inflammation  H. 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 URETER, FINAL RIGHT DISTAL MARGIN, BIOPSY: Benign ureter     05/18/2021 Cancer Staging   Staging form: Urinary Bladder, AJCC 8th Edition - Clinical: Stage IVB (cT4, cN2, cM1b) - Signed by Barry Portela, MD on 05/18/2021 WHO/ISUP grade (low/high): High Grade Histologic grading system: 2 grade system   06/08/2021 - 08/17/2021 Chemotherapy   Patient is on Treatment Plan : BLADDER Cisplatin D1 + Gemcitabine D1,8 q21d x 6 Cycles     09/07/2021 - 06/07/2022 Chemotherapy   Patient is on Treatment Plan : HEAD/NECK Pembrolizumab      09/07/2021 - 07/19/2022 Chemotherapy   Patient is on Treatment Plan : ANUS Pembrolizumab (400) q42d     09/27/2022 -  Chemotherapy   Patient is on Treatment Plan : UROTHELIAL LOCALLY ADVANCED, METASTATIC Enfortumab D1,8,15 q28d     11/21/2022 Imaging    IMPRESSION: 1. Relatively diffuse osseous metastasis, sclerotic less than lytic, newly apparent since 09/16/2022. This could represent development of osseous metastasis or healing of previously CT occult metastasis. 2. Otherwise, response to therapy of pelvic nodal and peritoneal metastasis. 3. No extra osseous metastasis within the chest. 4. Incidental findings, including: Aortic atherosclerosis (ICD10-I70.0), coronary artery atherosclerosis and emphysema (ICD10-J43.9). Cholelithiasis. Fat and nonobstructive small bowel containing ventral abdominal wall hernias.     12/10/2022 Imaging    IMPRESSION: 1. Diffuse osseous metastatic disease to the spine, ribs, sacrum and visualized pelvic bones. No associated fracture or retropulsion. 2. Degenerative changes of the lumbar spine with mild spinal canal stenosis at L3-4 and L4-5 and moderate narrowing of the bilateral subarticular zones  and moderate bilateral neural foraminal narrowing at L3-4. 3. Moderate narrowing of the left subarticular zone and moderate left neural foraminal narrowing at  L5-S1. 4. Cholelithiasis. 5. Stable 4 cm infrarenal aortic aneurysm.     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 male is a here because of ***. The patient was referred by ***. The patient presents to the clinic today accompanied by ***.   {Story of what lead them here}   Today the patient notes they felt/feeling prior/after...   {She/He} has a PMHx of....    Socially...    REVIEW OF SYSTEMS:   *** Constitutional: Denies fevers, chills or abnormal night sweats Eyes: Denies blurriness of vision, double vision or watery eyes Ears, nose, mouth, throat, and face: Denies mucositis or sore throat Respiratory: Denies cough, dyspnea or wheezes Cardiovascular: Denies palpitation, chest discomfort or lower extremity swelling Gastrointestinal:  Denies nausea, heartburn or change in bowel habits Skin: Denies abnormal skin rashes Lymphatics: Denies new lymphadenopathy or easy bruising Neurological:Denies numbness, tingling or new weaknesses Behavioral/Psych: Mood is stable, no new changes  All other systems were reviewed with the patient and are negative.   MEDICAL HISTORY:  Past Medical History:  Diagnosis Date   Cancer [facility])    bladder   Cataract    COVID-19 11/13/2019   h/a, chills, fatigue, loss of taste and smell, all symptoms resolved in a few weeks   Frequent urination    Fuchs' corneal dystrophy    Full dentures    GERD (gastroesophageal reflux disease)    Hypertension    Rheumatoid arthritis ([place])     SURGICAL HISTORY: Past Surgical History:  Procedure Laterality Date   CORNEAL TRANSPLANT Left 2013   CYSTOSCOPY WITH BIOPSY N/A 09/14/2019   Procedure: CYSTOSCOPY WITH BIOPSY;  Surgeon: Franchot Gallo, MD;  Location: [facility];  Service: Urology;  Laterality: N/A;   CYSTOSCOPY WITH INJECTION N/A 11/29/2020    Procedure: CYSTOSCOPY WITH INJECTION OF INDOCYANINE GREEN DYE;  Surgeon: Alexis Frock, MD;  Location: [facility];  Service: Urology;  Laterality: N/A;  6 HRS   FULGURATION OF BLADDER TUMOR N/A 09/14/2019   Procedure: FULGURATION OF BLADDER TUMOR;  Surgeon: Franchot Gallo, MD;  Location: [facility];  Service: Urology;  Laterality: N/A;   IR IMAGING GUIDED PORT INSERTION  06/07/2021   IR NEPHROSTOMY EXCHANGE LEFT  10/30/2020   IR NEPHROSTOMY EXCHANGE LEFT  11/19/2020   IR NEPHROSTOMY EXCHANGE RIGHT  10/30/2020   IR NEPHROSTOMY EXCHANGE RIGHT  11/19/2020   IR NEPHROSTOMY PLACEMENT LEFT  09/27/2020   IR NEPHROSTOMY PLACEMENT RIGHT  09/27/2020   ROBOT ASSISTED LAPAROSCOPIC COMPLETE CYSTECT ILEAL CONDUIT N/A 11/29/2020   Procedure: XI ROBOTIC ASSISTED LAPAROSCOPIC COMPLETE CYSTECT ILEAL CONDUIT/RADICAL PROSTATECTOMY/LYMPHADENECTOMY;  Surgeon: Alexis Frock, MD;  Location: [facility];  Service: Urology;  Laterality: N/A;   TRANSURETHRAL RESECTION OF BLADDER TUMOR WITH MITOMYCIN-C N/A 08/05/2019   Procedure: TRANSURETHRAL RESECTION OF BLADDER TUMOR;  Surgeon: Franchot Gallo, MD;  Location: [facility];  Service: Urology;  Laterality: N/A;  1 HR   TRANSURETHRAL RESECTION OF BLADDER TUMOR WITH MITOMYCIN-C N/A 04/24/2020   Procedure: TRANSURETHRAL RESECTION OF BLADDER TUMOR WITH GEMCIDABINE;  Surgeon: Franchot Gallo, MD;  Location: [facility];  Service: Urology;  Laterality: N/A;    SOCIAL HISTORY: Social History   Socioeconomic History   Marital status: Married  Spouse name: Not on file   Number of children: Not on file   Years of education: Not on file   Highest education level: Not on file  Occupational History   Not on file  Tobacco Use   Smoking status: Former    Packs/day: 0.50    Years: 20.00    Total pack years: 10.00    Types: Cigarettes   Smokeless tobacco: Never   Tobacco comments:    quit 2010  Vaping Use   Vaping Use: Never used  Substance and  Sexual Activity   Alcohol use: Yes    Comment: occ  2 x month   Drug use: No   Sexual activity: Yes  Other Topics Concern   Not on file  Social History Narrative   Not on file   Social Determinants of Health   Financial Resource Strain: Not on file  Food Insecurity: Not on file  Transportation Needs: Not on file  Physical Activity: Not on file  Stress: Not on file  Social Connections: Not on file  Intimate Partner Violence: Not on file    FAMILY HISTORY: No family history on file.  ALLERGIES:  is allergic to ace inhibitors and diflucan [fluconazole].  MEDICATIONS:  Current Outpatient Medications  Medication Sig Dispense Refill   acetaminophen (TYLENOL) 650 MG CR tablet Take 1,300 mg by mouth every morning.     amLODipine (NORVASC) 5 MG tablet Take 1 tablet (5 mg total) by mouth daily. 90 tablet 3   dexamethasone (DECADRON) 4 MG tablet Take 1 tablet (4 mg total) by mouth 2 (two) times daily. Take for 3 days after each chemotherapy treatment 24 tablet 3   HYDROcodone-acetaminophen (NORCO) 5-325 MG tablet Take 1 tablet by mouth every 6 (six) hours as needed for moderate pain. 60 tablet 0   hydroxychloroquine (PLAQUENIL) 200 MG tablet Take 200 mg by mouth 2 (two) times daily.     losartan (COZAAR) 25 MG tablet Take 1 tablet (25 mg total) by mouth daily. 90 tablet 3   morphine (MS CONTIN) 15 MG 12 hr tablet Take 1 tablet (15 mg total) by mouth every 12 (twelve) hours. 30 tablet 0   Multiple Vitamin (MULTIVITAMIN WITH MINERALS) TABS tablet Take 1 tablet by mouth daily.     omeprazole (PRILOSEC) 40 MG capsule Take 40 mg by mouth daily.     omeprazole (PRILOSEC) 40 MG capsule 1 capsule 30 minutes before morning meal Orally Once a day for 30 day(s)     ondansetron (ZOFRAN-ODT) 4 MG disintegrating tablet Take 1 tablet (4 mg total) by mouth every 8 (eight) hours as needed for nausea or vomiting. 20 tablet 1   prednisoLONE acetate (PRED FORTE) 1 % ophthalmic suspension Place 1 drop into  the left eye 2 (two) times a week. Instill 1 drop into left eye Monday and Friday.     predniSONE (DELTASONE) 5 MG tablet 4 tablet Orally Once a day as needed for flares for 30 days     promethazine (PHENERGAN) 25 MG tablet Take 1 tablet (25 mg total) by mouth every 6 (six) hours as needed for nausea or vomiting. 30 tablet 0   rosuvastatin (CRESTOR) 20 MG tablet Take 1 tablet (20 mg total) by mouth daily. 90 tablet 1   sertraline (ZOLOFT) 100 MG tablet Take 1 tablet (100 mg total) by mouth daily. 90 tablet 3   No current facility-administered medications for this visit.   Facility-Administered Medications Ordered in Other Visits  Medication Dose Route Frequency  Provider Last Rate Last Admin   acetaminophen (TYLENOL) 325 MG tablet            diphenhydrAMINE (BENADRYL) 25 mg capsule            gemcitabine (GEMZAR) chemo syringe for bladder instillation 2,000 mg  2,000 mg Bladder Instillation Once Franchot Gallo, MD       gemcitabine [facility]) chemo syringe for bladder instillation 2,000 mg  2,000 mg Bladder Instillation Once Franchot Gallo, MD        PHYSICAL EXAMINATION: ECOG PERFORMANCE STATUS: {CHL ONC ECOG [id number]  There were no vitals filed for this visit. There were no vitals filed for this visit. *** GENERAL:alert, no distress and comfortable SKIN: skin color, texture, turgor are normal, no rashes or significant lesions EYES: normal, Conjunctiva are pink and non-injected, sclera clear {OROPHARYNX:no exudate, no erythema and lips, buccal mucosa, and tongue normal}  NECK: supple, thyroid normal size, non-tender, without nodularity LYMPH:  no palpable lymphadenopathy in the cervical, axillary {or inguinal} LUNGS: clear to auscultation and percussion with normal breathing effort HEART: regular rate & rhythm and no murmurs and no lower extremity edema ABDOMEN:abdomen soft, non-tender and normal bowel sounds Musculoskeletal:no cyanosis of digits and no clubbing  NEURO:  alert & oriented x 3 with fluent speech, no focal motor/sensory deficits  LABORATORY DATA:  I have reviewed the data as listed    Latest Ref Rng & Units 11/29/2022    1:45 PM 11/15/2022    2:23 PM 11/06/2022    1:07 PM  CBC  WBC 4.0 - 10.5 K/uL 4.5  6.8  12.7   Hemoglobin 13.0 - 17.0 g/dL 11.4  12.6  14.2   Hematocrit 39.0 - 52.0 % 34.3  37.6  41.6   Platelets 150 - 400 K/uL 154  149  107        Latest Ref Rng & Units 11/29/2022    1:45 PM 11/15/2022    2:23 PM 11/06/2022    1:07 PM  CMP  Glucose 70 - 99 mg/dL 85  109  114   BUN 6 - 20 mg/dL 25  24  35   Creatinine 0.61 - 1.24 mg/dL 1.35  1.08  1.36   Sodium 135 - 145 mmol/L 139  137  134   Potassium 3.5 - 5.1 mmol/L 4.1  3.8  4.1   Chloride 98 - 111 mmol/L 108  107  105   CO2 22 - 32 mmol/L '24  23  22   '$ Calcium 8.9 - 10.3 mg/dL 8.9  8.5  8.4   Total Protein 6.5 - 8.1 g/dL 6.4  6.1  6.2   Total Bilirubin 0.3 - 1.2 mg/dL 0.4  0.5  0.4   Alkaline Phos 38 - 126 U/L 762  649  551   AST 15 - 41 U/L 43  47  49   ALT 0 - 44 U/L '11  13  25      '$ RADIOGRAPHIC STUDIES: I have personally reviewed the radiological images as listed and agreed with the findings in the report. MR THORACIC SPINE W WO CONTRAST  Result Date: 12/11/2022 CLINICAL DATA:  Malignant neoplasm of urinary bladder, unspecified site ([place]) C67.9 (ICD-10-CM). Metastatic disease evaluation. EXAM: MRI THORACIC AND LUMBAR SPINE WITHOUT AND WITH CONTRAST TECHNIQUE: Multiplanar and multiecho pulse sequences of the thoracic and lumbar spine were obtained without and with intravenous contrast. CONTRAST:  49m GADAVIST GADOBUTROL 1 MMOL/ML IV SOLN COMPARISON:  CT chest abdomen and pelvis November 21, 2022. FINDINGS: MRI  THORACIC SPINE FINDINGS Alignment:  Physiologic. Vertebrae: Diffuse T1 hypointensity throughout the thoracic vertebrae and visualized portion of the ribs with heterogeneous signal on T2 and STIR and heterogeneous diffuse contrast enhancement, consistent with diffuse  metastatic disease to the bones. T1 localizer images of the cervical spine also show diffuse T1 signal abnormality. There is no associated fracture or retropulsion. Cord:  Normal signal and morphology. Paraspinal and other soft tissues: Cholelithiasis. Minimal bilateral pleural effusions. Disc levels: Tiny posterior disc protrusions are seen at T5-6, T6-7 and T7-8. No significant spinal canal or neural foraminal stenosis at any level. MRI LUMBAR SPINE FINDINGS Segmentation:  Standard. Alignment:  Physiologic. Vertebrae: Diffuse T1 hypointensity throughout the lumbar spine, sacral and visualized pelvic bones with heterogeneous signal on T2 and STIR and heterogeneous diffuse contrast enhancement, consistent with diffuse metastatic disease to the bones. There is no associated fracture or retropulsion. Conus medullaris: Extends to the L1 level and appears normal. Paraspinal and other soft tissues: Stable 4 cm infrarenal aortic aneurysm. Disc levels: T12-L1, L1-2 and L2-3: No spinal canal or neural foraminal stenosis. L3-4: Disc bulge and mild facet degenerative changes with ligamentum flavum redundancy resulting in mild spinal canal stenosis with moderate narrowing of the bilateral subarticular zones and moderate bilateral neural foraminal narrowing. L4-5: Disc bulge, mild-to-moderate facet degenerative changes and mild ligamentum flavum redundancy resulting in mild spinal canal stenosis with mild narrowing of the bilateral subarticular zones, moderate right and mild left neural foraminal narrowing. L5-S1: Left central disc protrusion and mild facet degenerative changes resulting in moderate narrowing of the left subarticular zone, mild right and moderate left neural foraminal narrowing. IMPRESSION: 1. Diffuse osseous metastatic disease to the spine, ribs, sacrum and visualized pelvic bones. No associated fracture or retropulsion. 2. Degenerative changes of the lumbar spine with mild spinal canal stenosis at L3-4 and  L4-5 and moderate narrowing of the bilateral subarticular zones and moderate bilateral neural foraminal narrowing at L3-4. 3. Moderate narrowing of the left subarticular zone and moderate left neural foraminal narrowing at L5-S1. 4. Cholelithiasis. 5. Stable 4 cm infrarenal aortic aneurysm. Electronically Signed   By: Pedro Earls M.D.   On: 12/11/2022 09:20   MR Lumbar Spine W Wo Contrast  Result Date: 12/11/2022 CLINICAL DATA:  Malignant neoplasm of urinary bladder, unspecified site ([place]) C67.9 (ICD-10-CM). Metastatic disease evaluation. EXAM: MRI THORACIC AND LUMBAR SPINE WITHOUT AND WITH CONTRAST TECHNIQUE: Multiplanar and multiecho pulse sequences of the thoracic and lumbar spine were obtained without and with intravenous contrast. CONTRAST:  17m GADAVIST GADOBUTROL 1 MMOL/ML IV SOLN COMPARISON:  CT chest abdomen and pelvis November 21, 2022. FINDINGS: MRI THORACIC SPINE FINDINGS Alignment:  Physiologic. Vertebrae: Diffuse T1 hypointensity throughout the thoracic vertebrae and visualized portion of the ribs with heterogeneous signal on T2 and STIR and heterogeneous diffuse contrast enhancement, consistent with diffuse metastatic disease to the bones. T1 localizer images of the cervical spine also show diffuse T1 signal abnormality. There is no associated fracture or retropulsion. Cord:  Normal signal and morphology. Paraspinal and other soft tissues: Cholelithiasis. Minimal bilateral pleural effusions. Disc levels: Tiny posterior disc protrusions are seen at T5-6, T6-7 and T7-8. No significant spinal canal or neural foraminal stenosis at any level. MRI LUMBAR SPINE FINDINGS Segmentation:  Standard. Alignment:  Physiologic. Vertebrae: Diffuse T1 hypointensity throughout the lumbar spine, sacral and visualized pelvic bones with heterogeneous signal on T2 and STIR and heterogeneous diffuse contrast enhancement, consistent with diffuse metastatic disease to the bones. There is no associated  fracture or  retropulsion. Conus medullaris: Extends to the L1 level and appears normal. Paraspinal and other soft tissues: Stable 4 cm infrarenal aortic aneurysm. Disc levels: T12-L1, L1-2 and L2-3: No spinal canal or neural foraminal stenosis. L3-4: Disc bulge and mild facet degenerative changes with ligamentum flavum redundancy resulting in mild spinal canal stenosis with moderate narrowing of the bilateral subarticular zones and moderate bilateral neural foraminal narrowing. L4-5: Disc bulge, mild-to-moderate facet degenerative changes and mild ligamentum flavum redundancy resulting in mild spinal canal stenosis with mild narrowing of the bilateral subarticular zones, moderate right and mild left neural foraminal narrowing. L5-S1: Left central disc protrusion and mild facet degenerative changes resulting in moderate narrowing of the left subarticular zone, mild right and moderate left neural foraminal narrowing. IMPRESSION: 1. Diffuse osseous metastatic disease to the spine, ribs, sacrum and visualized pelvic bones. No associated fracture or retropulsion. 2. Degenerative changes of the lumbar spine with mild spinal canal stenosis at L3-4 and L4-5 and moderate narrowing of the bilateral subarticular zones and moderate bilateral neural foraminal narrowing at L3-4. 3. Moderate narrowing of the left subarticular zone and moderate left neural foraminal narrowing at L5-S1. 4. Cholelithiasis. 5. Stable 4 cm infrarenal aortic aneurysm. Electronically Signed   By: Pedro Earls M.D.   On: 12/11/2022 09:20   CT CHEST ABDOMEN PELVIS W CONTRAST  Result Date: 11/22/2022 CLINICAL DATA:  Invasive bladder cancer. Evaluate treatment response. * Tracking Code: BO * EXAM: CT CHEST, ABDOMEN, AND PELVIS WITH CONTRAST TECHNIQUE: Multidetector CT imaging of the chest, abdomen and pelvis was performed following the standard protocol during bolus administration of intravenous contrast. RADIATION DOSE REDUCTION: This  exam was performed according to the departmental dose-optimization program which includes automated exposure control, adjustment of the mA and/or kV according to patient size and/or use of iterative reconstruction technique. CONTRAST:  145m OMNIPAQUE IOHEXOL 300 MG/ML  SOLN COMPARISON:  09/16/2022 FINDINGS: CT CHEST FINDINGS Cardiovascular: Right Port-A-Cath tip high right atrium. Aortic atherosclerosis. Normal heart size, without pericardial effusion. Left main, lad, left circumflex coronary artery calcification. No central pulmonary embolism, on this non-dedicated study. Mediastinum/Nodes: No supraclavicular adenopathy. No axillary adenopathy. No mediastinal or hilar adenopathy. Lungs/Pleura: No pleural fluid.  Mild centrilobular emphysema. Musculoskeletal: Development of sclerotic and lytic osseous lesions relatively diffusely throughout the marrow space. Example sclerotic lesion of 1.8 cm within the T9 vertebral body on 114/6. CT ABDOMEN PELVIS FINDINGS Hepatobiliary: Normal liver. 7 mm gallstone without acute cholecystitis or biliary duct dilatation. Pancreas: Normal, without mass or ductal dilatation. Spleen: Normal in size, without focal abnormality. Adrenals/Urinary Tract: Normal adrenal glands. Interpolar right renal 1.7 cm cyst. Bilateral renal cysts of maximally 1.7 cm . In the absence of clinically indicated signs/symptoms require(s) no independent follow-up. Mild left renal cortical thinning. Moderate renal collecting system opacification on delayed images. Status post cystectomy and ileal conduit creation, without acute complication. Stomach/Bowel: Normal stomach, without wall thickening. Scattered colonic diverticula. Normal terminal ileum and appendix. Nonobstructivea small bowel again enters a left paramidline ventral abdominal wall hernia on 84/2. Vascular/Lymphatic: Infrarenal abdominal aortic aneurysm is mild at 4.0 cm and not significantly changed. No extension into the iliacs. No abdominal  adenopathy. Index right external iliac node measures 5 mm on 109/2 versus 14 mm on the prior. Right inguinal node measures 4 mm on 113/2 versus 15 mm on the prior. No new pelvic adenopathy. Reproductive: Prostatectomy without local recurrence. Other: No significant free fluid. A separate, more cephalad fat containing hernia is diminutive on 75/2. The right upper quadrant  omental nodularity has resolved, with only minimal linear density remaining on 73/2. No new peritoneal metastasis identified. Musculoskeletal: New heterogeneous sclerosis throughout the marrow, including relatively diffusely within the pelvis on 101/2. IMPRESSION: 1. Relatively diffuse osseous metastasis, sclerotic less than lytic, newly apparent since 09/16/2022. This could represent development of osseous metastasis or healing of previously CT occult metastasis. 2. Otherwise, response to therapy of pelvic nodal and peritoneal metastasis. 3. No extra osseous metastasis within the chest. 4. Incidental findings, including: Aortic atherosclerosis (ICD10-I70.0), coronary artery atherosclerosis and emphysema (ICD10-J43.9). Cholelithiasis. Fat and nonobstructive small bowel containing ventral abdominal wall hernias. Electronically Signed   By: Abigail Miyamoto M.D.   On: 11/22/2022 13:03     No orders of the defined types were placed in this encounter.   All questions were answered. The patient knows to call the clinic with any problems, questions or concerns. The total time spent in the appointment was {CHL ONC TIME VISIT - [id number].     Baldemar Friday, [place] 12/13/2022 2:02 PM  I, Audry Riles am acting as scribe for Truitt Merle, MD.   {Add scribe attestation statement}

## 2022-12-13 NOTE — Assessment & Plan Note (Signed)
- 

## 2022-12-13 NOTE — H&P (Signed)
History and Physical    Barry Gibson:[id number] DOB: [birth year] DOA: 12/13/2022  DOS: the patient was seen and examined on 12/13/2022  PCP: Coral Spikes, DO   Patient coming from: Clinic  I have personally briefly reviewed patient's old medical records in [place]  [name], a 62 y/o with h/o HTN, HLD, PIUD, RA and Bladder cancer. He has had a stormy course with the bladder cancer with multiple treatments with Chemotherapy and XRT; cystectomy with creation of ileoconduit. He is continuing to have XRT and immunologic chemotherapy. He has frequent side effects of Nausea, vomiting with poor po intake after treatment and gets IV fluids post treatment. Today at the cancer center he felt very light headed and near syncopal. He was hypotensive. He was referred to [facility] ED for evaluation.   ED Course: T 97.4  101/73  HR 89  RR 18. Per EDP patient in no acute distress but appeared dehydrated. Lab: BUN 50 Cr 1.88 (baseline 1.04) Mg 1.5, Alk phos 581  Lac 0.9  CBCD nl. TRH called to admit for continued rehydration  Review of Systems:  Review of Systems  Constitutional:  Negative for chills, fever and weight loss.  HENT: Negative.    Eyes: Negative.   Respiratory: Negative.    Cardiovascular: Negative.   Gastrointestinal:  Positive for diarrhea, nausea and vomiting.  Genitourinary: Negative.   Musculoskeletal: Negative.   Skin: Negative.   Neurological: Negative.   Endo/Heme/Allergies: Negative.   Psychiatric/Behavioral: Negative.      Past Medical History:  Diagnosis Date   Cancer [facility])    bladder   Cataract    COVID-19 11/13/2019   h/a, chills, fatigue, loss of taste and smell, all symptoms resolved in a few weeks   Frequent urination    Fuchs' corneal dystrophy    Full dentures    GERD (gastroesophageal reflux disease)    Hypertension    Rheumatoid arthritis ([place])     Past Surgical History:  Procedure Laterality Date   CORNEAL TRANSPLANT Left 2013    CYSTOSCOPY WITH BIOPSY N/A 09/14/2019   Procedure: CYSTOSCOPY WITH BIOPSY;  Surgeon: Franchot Gallo, MD;  Location: [facility];  Service: Urology;  Laterality: N/A;   CYSTOSCOPY WITH INJECTION N/A 11/29/2020   Procedure: CYSTOSCOPY WITH INJECTION OF INDOCYANINE GREEN DYE;  Surgeon: Alexis Frock, MD;  Location: [facility];  Service: Urology;  Laterality: N/A;  6 HRS   FULGURATION OF BLADDER TUMOR N/A 09/14/2019   Procedure: FULGURATION OF BLADDER TUMOR;  Surgeon: Franchot Gallo, MD;  Location: [facility];  Service: Urology;  Laterality: N/A;   IR IMAGING GUIDED PORT INSERTION  06/07/2021   IR NEPHROSTOMY EXCHANGE LEFT  10/30/2020   IR NEPHROSTOMY EXCHANGE LEFT  11/19/2020   IR NEPHROSTOMY EXCHANGE RIGHT  10/30/2020   IR NEPHROSTOMY EXCHANGE RIGHT  11/19/2020   IR NEPHROSTOMY PLACEMENT LEFT  09/27/2020   IR NEPHROSTOMY PLACEMENT RIGHT  09/27/2020   ROBOT ASSISTED LAPAROSCOPIC COMPLETE CYSTECT ILEAL CONDUIT N/A 11/29/2020   Procedure: XI ROBOTIC ASSISTED LAPAROSCOPIC COMPLETE CYSTECT ILEAL CONDUIT/RADICAL PROSTATECTOMY/LYMPHADENECTOMY;  Surgeon: Alexis Frock, MD;  Location: [facility];  Service: Urology;  Laterality: N/A;   TRANSURETHRAL RESECTION OF BLADDER TUMOR WITH MITOMYCIN-C N/A 08/05/2019   Procedure: TRANSURETHRAL RESECTION OF BLADDER TUMOR;  Surgeon: Franchot Gallo, MD;  Location: [facility];  Service: Urology;  Laterality: N/A;  1 HR   TRANSURETHRAL RESECTION OF BLADDER TUMOR WITH MITOMYCIN-C N/A 04/24/2020   Procedure: TRANSURETHRAL RESECTION OF BLADDER TUMOR WITH GEMCIDABINE;  Surgeon: Franchot Gallo, MD;  Location: [facility];  Service: Urology;  Laterality: N/A;    Soc Hx - married twice that didn't work. 3rd marriage going strong. 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 His smoking use included cigarettes. He has a 10.00 pack-year smoking history. He has never used smokeless  tobacco. He reports current alcohol use. He reports that he does not use drugs.  Allergies  Allergen Reactions   Ace Inhibitors Cough   Diflucan [Fluconazole] Other (See Comments)    Irritated ulcers    History reviewed. No pertinent family history.  Prior to Admission medications   Medication Sig Start Date End Date Taking? Authorizing Provider  acetaminophen (TYLENOL) 650 MG CR tablet Take 1,300 mg by mouth every morning.    [provider]  amLODipine (NORVASC) 5 MG tablet Take 1 tablet (5 mg total) by mouth daily. 09/02/22   Coral Spikes, DO  dexamethasone (DECADRON) 4 MG tablet Take 1 tablet (4 mg total) by mouth 2 (two) times daily. Take for 3 days after each chemotherapy treatment 10/25/22   Wyatt Portela, MD  HYDROcodone-acetaminophen (NORCO) 5-325 MG tablet Take 1 tablet by mouth every 6 (six) hours as needed for moderate pain. 12/02/22   Wyatt Portela, MD  hydroxychloroquine (PLAQUENIL) 200 MG tablet Take 200 mg by mouth 2 (two) times daily. 08/09/22   [provider]  losartan (COZAAR) 25 MG tablet Take 1 tablet (25 mg total) by mouth daily. 09/02/22   Coral Spikes, DO  morphine (MS CONTIN) 15 MG 12 hr tablet Take 1 tablet (15 mg total) by mouth every 12 (twelve) hours. 12/10/22   Pickenpack-Cousar, Carlena Sax, NP  Multiple Vitamin (MULTIVITAMIN WITH MINERALS) TABS tablet Take 1 tablet by mouth daily. 09/18/21   Debbe Odea, MD  omeprazole (PRILOSEC) 40 MG capsule Take 40 mg by mouth daily.    [provider]  omeprazole (PRILOSEC) 40 MG capsule 1 capsule 30 minutes before morning meal Orally Once a day for 30 day(s)    [provider]  ondansetron (ZOFRAN-ODT) 4 MG disintegrating tablet Take 1 tablet (4 mg total) by mouth every 8 (eight) hours as needed for nausea or vomiting. 10/25/22   Wyatt Portela, MD  prednisoLONE acetate (PRED FORTE) 1 % ophthalmic suspension Place 1 drop into the left eye 2 (two) times a week. Instill 1 drop into left  eye Monday and Friday.    [provider]  predniSONE (DELTASONE) 5 MG tablet 4 tablet Orally Once a day as needed for flares for 30 days 10/14/22   [provider]  promethazine (PHENERGAN) 25 MG tablet Take 1 tablet (25 mg total) by mouth every 6 (six) hours as needed for nausea or vomiting. 10/25/22   Wyatt Portela, MD  rosuvastatin (CRESTOR) 20 MG tablet Take 1 tablet (20 mg total) by mouth daily. 12/02/22   Coral Spikes, DO  sertraline (ZOLOFT) 100 MG tablet Take 1 tablet (100 mg total) by mouth daily. 09/24/22   Coral Spikes, DO    Physical Exam: Vitals:   12/13/22 2015 12/13/22 2030 12/13/22 2100 12/13/22 2130  BP: 112/65 108/61 117/65 110/61  Pulse: (!) 109 (!) 109 (!) 109 (!) 113  Resp:  '19 19 19  '$ Temp:      TempSrc:      SpO2: 93% 93% 91% 93%    Physical Exam Constitutional:  General: He is not in acute distress.    Appearance: Normal appearance. He is normal weight. He is ill-appearing. He is not toxic-appearing.  HENT:     Head: Normocephalic and atraumatic.     Mouth/Throat:     Mouth: Mucous membranes are dry.     Pharynx: Oropharynx is clear.  Eyes:     Extraocular Movements: Extraocular movements intact.     Conjunctiva/sclera: Conjunctivae normal.     Pupils: Pupils are equal, round, and reactive to light.  Cardiovascular:     Rate and Rhythm: Normal rate and regular rhythm.     Pulses: Normal pulses.     Heart sounds: Normal heart sounds.  Pulmonary:     Effort: Pulmonary effort is normal. No respiratory distress.     Breath sounds: Normal breath sounds.  Abdominal:     Palpations: Abdomen is soft.     Comments: Ioleconduit lower abdomen-functioning  Musculoskeletal:        General: Normal range of motion.     Cervical back: Normal range of motion.  Skin:    General: Skin is warm and dry.  Neurological:     General: No focal deficit present.     Mental Status: He is alert and oriented to person, place, and time.  Psychiatric:         Mood and Affect: Mood normal.      Labs on Admission: I have personally reviewed following labs and imaging studies  CBC: Recent Labs  Lab 12/13/22 1455  WBC 6.0  NEUTROABS 4.2  HGB 10.7*  HCT 33.5*  MCV 90.5  PLT 82*   Basic Metabolic Panel: Recent Labs  Lab 12/13/22 1455  NA 135  K 3.9  CL 108  CO2 17*  GLUCOSE 95  BUN 50*  CREATININE 1.88*  CALCIUM 8.5*   GFR: Estimated Creatinine Clearance: 44.5 mL/min (A) (by C-G formula based on SCr of 1.88 mg/dL (H)). Liver Function Tests: Recent Labs  Lab 12/13/22 1455  AST 42*  ALT 13  ALKPHOS 581*  BILITOT 0.6  PROT 6.6  ALBUMIN 3.5   Recent Labs  Lab 12/13/22 1455  LIPASE 34   No results for input(s): "AMMONIA" in the last 168 hours. Coagulation Profile: No results for input(s): "INR", "PROTIME" in the last 168 hours. Cardiac Enzymes: No results for input(s): "CKTOTAL", "CKMB", "CKMBINDEX", "TROPONINI" in the last 168 hours. BNP (last 3 results) No results for input(s): "PROBNP" in the last 8760 hours. HbA1C: No results for input(s): "HGBA1C" in the last 72 hours. CBG: No results for input(s): "GLUCAP" in the last 168 hours. Lipid Profile: No results for input(s): "CHOL", "HDL", "LDLCALC", "TRIG", "CHOLHDL", "LDLDIRECT" in the last 72 hours. Thyroid Function Tests: No results for input(s): "TSH", "T4TOTAL", "FREET4", "T3FREE", "THYROIDAB" in the last 72 hours. Anemia Panel: No results for input(s): "VITAMINB12", "FOLATE", "FERRITIN", "TIBC", "IRON", "RETICCTPCT" in the last 72 hours. Urine analysis:    Component Value Date/Time   COLORURINE YELLOW 12/13/2022 1820   APPEARANCEUR TURBID (A) 12/13/2022 1820   APPEARANCEUR Hazy (A) 09/22/2020 1521   LABSPEC 1.015 12/13/2022 1820   PHURINE 5.0 12/13/2022 1820   GLUCOSEU NEGATIVE 12/13/2022 1820   HGBUR LARGE (A) 12/13/2022 1820   BILIRUBINUR NEGATIVE 12/13/2022 1820   BILIRUBINUR Negative 09/22/2020 1521   KETONESUR NEGATIVE 12/13/2022 1820    PROTEINUR >=300 (A) 12/13/2022 1820   UROBILINOGEN 0.2 06/09/2020 1536   NITRITE NEGATIVE 12/13/2022 1820   LEUKOCYTESUR MODERATE (A) 12/13/2022 1820    Radiological Exams  on Admission: I have personally reviewed images DG Chest Port 1 View  Result Date: 12/13/2022 CLINICAL DATA:  Hypotension EXAM: PORTABLE CHEST 1 VIEW COMPARISON:  09/16/2022 FINDINGS: Right-sided chest port remains in place. Normal heart size. No focal airspace consolidation, pleural effusion, or pneumothorax. Known osseous metastatic disease, better seen on cross-sectional imaging. IMPRESSION: No active disease. Electronically Signed   By: Davina Poke D.O.   On: 12/13/2022 16:24    EKG: I have personally reviewed EKG: no EKG on chart  Assessment/Plan Principal Problem:   AKI (acute kidney injury) ([place]) Active Problems:   Hyperlipidemia   Essential hypertension, benign   Bladder cancer (HCC)   Dehydration, severe   Advanced care planning/counseling discussion    Assessment and Plan: * AKI (acute kidney injury) ([place]) Patient with fluid losses related to chemotherapy side effects. Baseline Cr 1.04. At presentation 1.88  Plan Vigorous hydration  F/u Bmet in AM  Essential hypertension, benign Patients BP improving with hydration  Plan Continue home regimen  Hyperlipidemia Continue home regimen  Advanced care planning/counseling discussion Discussed with the patient, his son was present. He clearly states he does not want cardiac resuscitation in the event of cardiac arrest or mechanical ventilation in the event of respiratory failure  Plan DNR/DNI  Dehydration, severe Patient with N/V  related to opioid (morphine) analgesia. Today, when at treatment he felt very light headed and near syncopal. He was found to be hypotensive and had AKI. With bone mets to spine patient had marked pain requiring narcotic control. With XRT he is doing better. He was taking Norco but this was not adequate. He was recently  given trail of MScontin which caused severe nausea and vomiting. Diarrhea may have been result of over use of laxative  Plan Fluid replacement  Antiemetics  OxyIR 10 mg q 6 prn for pain control  F/u Bmet   Bladder cancer [facility]) Patient with metastatic bladder cancer. Currently getting XRT and immunochemotherapy. The back/bone pain is improving with XRT and he has been tolerating chemo  Plan Per oncology       DVT prophylaxis: Lovenox Code Status: DNR/DNI(Do NOT Intubate) Family Communication: spoke with son and wife. They understand dx and tx plan  Disposition Plan: home when stable 24-48 hrs  Consults called: none  Admission status: Observation, Med-Surg   Adella Hare, MD [facility] 12/13/2022, 10:30 PM

## 2022-12-14 DIAGNOSIS — N179 Acute kidney failure, unspecified: Secondary | ICD-10-CM | POA: Diagnosis not present

## 2022-12-14 LAB — BASIC METABOLIC PANEL
Anion gap: 8 (ref 5–15)
BUN: 36 mg/dL — ABNORMAL HIGH (ref 6–20)
CO2: 16 mmol/L — ABNORMAL LOW (ref 22–32)
Calcium: 7.8 mg/dL — ABNORMAL LOW (ref 8.9–10.3)
Chloride: 109 mmol/L (ref 98–111)
Creatinine, Ser: 1.53 mg/dL — ABNORMAL HIGH (ref 0.61–1.24)
GFR, Estimated: 52 mL/min — ABNORMAL LOW (ref >60)
Glucose, Bld: 74 mg/dL (ref 70–99)
Potassium: 3.8 mmol/L (ref 3.5–5.1)
Sodium: 133 mmol/L — ABNORMAL LOW (ref 135–145)

## 2022-12-14 MED ORDER — METHYLPREDNISOLONE SODIUM SUCC 125 MG IJ SOLR
125.0000 mg | Freq: Once | INTRAMUSCULAR | Status: AC
  Administered 2022-12-14: 125 mg via INTRAVENOUS
  Filled 2022-12-14: qty 2

## 2022-12-14 MED ORDER — CHLORHEXIDINE GLUCONATE CLOTH 2 % EX PADS
6.0000 | MEDICATED_PAD | Freq: Every day | CUTANEOUS | Status: DC
  Administered 2022-12-15: 6 via TOPICAL

## 2022-12-14 MED ORDER — METHOCARBAMOL 1000 MG/10ML IJ SOLN
500.0000 mg | Freq: Three times a day (TID) | INTRAVENOUS | Status: DC | PRN
  Filled 2022-12-14: qty 5

## 2022-12-14 MED ORDER — HYDROMORPHONE HCL 1 MG/ML IJ SOLN: 0.5000 mg | INTRAMUSCULAR | Status: DC | PRN

## 2022-12-14 NOTE — ED Notes (Signed)
ED TO INPATIENT HANDOFF REPORT  ED Nurse Name and Phone #: Gean Birchwood Name/Age/Gender Barry Gibson 62 y.o. male Room/Bed: WA15/WA15  Code Status   Code Status: DNR  Home/SNF/Other Home Patient oriented to: self, place, time, and situation Is this baseline? Yes   Triage Complete: Triage complete  Chief Complaint AKI (acute kidney injury) [facility]) [N17.9]  Triage Note Patient went to the cancer center to receive radiation and chemo treatment. RN checked his vitals and his BP was 50/30 at the cancer center. They called a rapid and brought him to the ED. Stage 4 bladder cancer. Diarrhea and vomiting for a couple days. Feeling lightheaded and dizzy.   Allergies Allergies  Allergen Reactions   Ace Inhibitors Cough   Diflucan [Fluconazole] Other (See Comments)    Irritated ulcers    Level of Care/Admitting Diagnosis ED Disposition     ED Disposition  Admit   Condition  --   Comment  Hospital Area: [facility] [[id number]]  Level of Care: Med-Surg [16]  May place patient in observation at [facility] or [facility] if equivalent level of care is available:: No  Covid Evaluation: Asymptomatic - no recent exposure (last 10 days) testing not required  Diagnosis: AKI (acute kidney injury) [facility]) [[id number]]  Admitting Physician: Neena Rhymes [[id number]]  Attending Physician: Neena Rhymes [[id number]]          B Medical/Surgery History Past Medical History:  Diagnosis Date   Cancer ([place])    bladder   Cataract    COVID-19 11/13/2019   h/a, chills, fatigue, loss of taste and smell, all symptoms resolved in a few weeks   Frequent urination    Fuchs' corneal dystrophy    Full dentures    GERD (gastroesophageal reflux disease)    Hypertension    Rheumatoid arthritis ([place])    Past Surgical History:  Procedure Laterality Date   CORNEAL TRANSPLANT Left 2013   CYSTOSCOPY WITH BIOPSY N/A 09/14/2019   Procedure: CYSTOSCOPY WITH BIOPSY;  Surgeon:  Franchot Gallo, MD;  Location: [facility];  Service: Urology;  Laterality: N/A;   CYSTOSCOPY WITH INJECTION N/A 11/29/2020   Procedure: CYSTOSCOPY WITH INJECTION OF INDOCYANINE GREEN DYE;  Surgeon: Alexis Frock, MD;  Location: [facility];  Service: Urology;  Laterality: N/A;  6 HRS   FULGURATION OF BLADDER TUMOR N/A 09/14/2019   Procedure: FULGURATION OF BLADDER TUMOR;  Surgeon: Franchot Gallo, MD;  Location: [facility];  Service: Urology;  Laterality: N/A;   IR IMAGING GUIDED PORT INSERTION  06/07/2021   IR NEPHROSTOMY EXCHANGE LEFT  10/30/2020   IR NEPHROSTOMY EXCHANGE LEFT  11/19/2020   IR NEPHROSTOMY EXCHANGE RIGHT  10/30/2020   IR NEPHROSTOMY EXCHANGE RIGHT  11/19/2020   IR NEPHROSTOMY PLACEMENT LEFT  09/27/2020   IR NEPHROSTOMY PLACEMENT RIGHT  09/27/2020   ROBOT ASSISTED LAPAROSCOPIC COMPLETE CYSTECT ILEAL CONDUIT N/A 11/29/2020   Procedure: XI ROBOTIC ASSISTED LAPAROSCOPIC COMPLETE CYSTECT ILEAL CONDUIT/RADICAL PROSTATECTOMY/LYMPHADENECTOMY;  Surgeon: Alexis Frock, MD;  Location: [facility];  Service: Urology;  Laterality: N/A;   TRANSURETHRAL RESECTION OF BLADDER TUMOR WITH MITOMYCIN-C N/A 08/05/2019   Procedure: TRANSURETHRAL RESECTION OF BLADDER TUMOR;  Surgeon: Franchot Gallo, MD;  Location: [facility];  Service: Urology;  Laterality: N/A;  1 HR   TRANSURETHRAL RESECTION OF BLADDER TUMOR WITH MITOMYCIN-C N/A 04/24/2020   Procedure: TRANSURETHRAL RESECTION OF BLADDER TUMOR WITH GEMCIDABINE;  Surgeon: Franchot Gallo, MD;  Location: [facility];  Service: Urology;  Laterality: N/A;  A IV Location/Drains/Wounds Patient Lines/Drains/Airways Status     Active Line/Drains/Airways     Name Placement date Placement time Site Days   Implanted Port 06/07/21 Right Chest 06/07/21  1534  Chest  555   Nephrostomy Left 10.2 Fr. 11/19/20  1020  Left  755   Nephrostomy Right 10.2 Fr. 11/19/20  1037  Right  755   Urostomy Ileal conduit RLQ 11/29/20  1358  RLQ  745    Urostomy Ureterostomy right RLQ --  --  RLQ  --   Ureteral Drain/Stent Left ureter 7.2 Fr. 11/29/20  1321  Left ureter  745   Ureteral Drain/Stent Right ureter 7.2 Fr. 11/29/20  1337  Right ureter  745   Incision - 3 Ports Abdomen Right;Lateral Right;Mid Left;Lateral 11/29/20  0917  -- 745            Intake/Output Last 24 hours  Intake/Output Summary (Last 24 hours) at 12/14/2022 1136 Last data filed at 12/14/2022 0500 Gross per 24 hour  Intake --  Output 800 ml  Net -800 ml    Labs/Imaging Results for orders placed or performed during the hospital encounter of 12/13/22 (from the past 48 hour(s))  CBC with Differential/Platelet     Status: Abnormal   Collection Time: 12/13/22  2:55 PM  Result Value Ref Range   WBC 6.0 4.0 - 10.5 K/uL   RBC 3.70 (L) 4.22 - 5.81 MIL/uL   Hemoglobin 10.7 (L) 13.0 - 17.0 g/dL   HCT 33.5 (L) 39.0 - 52.0 %   MCV 90.5 80.0 - 100.0 fL   MCH 28.9 26.0 - 34.0 pg   MCHC 31.9 30.0 - 36.0 g/dL   RDW 16.9 (H) 11.5 - 15.5 %   Platelets 82 (L) 150 - 400 K/uL    Comment: SPECIMEN CHECKED FOR CLOTS Immature Platelet Fraction may be clinically indicated, consider ordering this additional test [id number] REPEATED TO VERIFY PLATELET COUNT CONFIRMED BY SMEAR    nRBC 1.2 (H) 0.0 - 0.2 %   Neutrophils Relative % 70 %   Neutro Abs 4.2 1.7 - 7.7 K/uL   Lymphocytes Relative 17 %   Lymphs Abs 1.0 0.7 - 4.0 K/uL   Monocytes Relative 6 %   Monocytes Absolute 0.4 0.1 - 1.0 K/uL   Eosinophils Relative 1 %   Eosinophils Absolute 0.1 0.0 - 0.5 K/uL   Basophils Relative 1 %   Basophils Absolute 0.0 0.0 - 0.1 K/uL   Immature Granulocytes 5 %   Abs Immature Granulocytes 0.32 (H) 0.00 - 0.07 K/uL    Comment: Performed at [facility], [place] [address], [place], [place] [address]  Comprehensive metabolic panel     Status: Abnormal   Collection Time: 12/13/22  2:55 PM  Result Value Ref Range   Sodium 135 135 - 145 mmol/L   Potassium 3.9 3.5 - 5.1  mmol/L   Chloride 108 98 - 111 mmol/L   CO2 17 (L) 22 - 32 mmol/L   Glucose, Bld 95 70 - 99 mg/dL    Comment: Glucose reference range applies only to samples taken after fasting for at least 8 hours.   BUN 50 (H) 6 - 20 mg/dL   Creatinine, Ser 1.88 (H) 0.61 - 1.24 mg/dL   Calcium 8.5 (L) 8.9 - 10.3 mg/dL   Total Protein 6.6 6.5 - 8.1 g/dL   Albumin 3.5 3.5 - 5.0 g/dL   AST 42 (H) 15 - 41 U/L   ALT 13 0 - 44 U/L  Alkaline Phosphatase 581 (H) 38 - 126 U/L   Total Bilirubin 0.6 0.3 - 1.2 mg/dL   GFR, Estimated 40 (L) >60 mL/min    Comment: (NOTE) Calculated using the CKD-EPI Creatinine Equation (2021)    Anion gap 10 5 - 15    Comment: Performed at [facility], [place] [address] [place], [place] [address]  Lipase, blood     Status: None   Collection Time: 12/13/22  2:55 PM  Result Value Ref Range   Lipase 34 11 - 51 U/L    Comment: Performed at [facility], [place] [address], [place], [place] [address]  Lactic acid, plasma     Status: None   Collection Time: 12/13/22  2:55 PM  Result Value Ref Range   Lactic Acid, Venous 0.9 0.5 - 1.9 mmol/L    Comment: Performed at [facility], [place] [address], [place], [place] [address]  Culture, blood (Routine X 2) w Reflex to ID Panel     Status: None (Preliminary result)   Collection Time: 12/13/22  4:10 PM   Specimen: BLOOD LEFT HAND  Result Value Ref Range   Specimen Description      BLOOD LEFT HAND Performed at [facility], [place] [address] [place], [place] [address]    Special Requests      BOTTLES DRAWN AEROBIC AND ANAEROBIC Blood Culture adequate volume Performed at [place] [address], [place], [place] [address]    Culture      NO GROWTH < 12 HOURS Performed at [place] [address] [place], [place] [address]    Report Status PENDING   Culture, blood (Routine X 2) w Reflex to ID Panel     Status: None (Preliminary result)    Collection Time: 12/13/22  4:14 PM   Specimen: BLOOD  Result Value Ref Range   Specimen Description      BLOOD RIGHT ANTECUBITAL Performed at [facility], [place] [address] [place], [place] [address]    Special Requests      BOTTLES DRAWN AEROBIC AND ANAEROBIC Blood Culture adequate volume Performed at [place] [address] [place], [place] [address]    Culture      NO GROWTH < 12 HOURS Performed at [place] [address], [place], [place] [address]    Report Status PENDING   Urinalysis, Routine w reflex microscopic -Urine, Unspecified Source     Status: Abnormal   Collection Time: 12/13/22  6:20 PM  Result Value Ref Range   Color, Urine YELLOW YELLOW   APPearance TURBID (A) CLEAR   Specific Gravity, Urine 1.015 1.005 - 1.030   pH 5.0 5.0 - 8.0   Glucose, UA NEGATIVE NEGATIVE mg/dL   Hgb urine dipstick LARGE (A) NEGATIVE   Bilirubin Urine NEGATIVE NEGATIVE   Ketones, ur NEGATIVE NEGATIVE mg/dL   Protein, ur >=300 (A) NEGATIVE mg/dL   Nitrite NEGATIVE NEGATIVE   Leukocytes,Ua MODERATE (A) NEGATIVE   RBC / HPF >50 0 - 5 RBC/hpf   WBC, UA >50 0 - 5 WBC/hpf   Bacteria, UA FEW (A) NONE SEEN   Squamous Epithelial / HPF 0-5 0 - 5 /HPF   WBC Clumps PRESENT    Non Squamous Epithelial 0-5 (A) NONE SEEN    Comment: Performed at [facility], [place] [address], [place], [place] [address]  Basic metabolic panel     Status: Abnormal  Collection Time: 12/14/22  5:37 AM  Result Value Ref Range   Sodium 133 (L) 135 - 145 mmol/L   Potassium 3.8 3.5 - 5.1 mmol/L   Chloride 109 98 - 111 mmol/L   CO2 16 (L) 22 - 32 mmol/L   Glucose, Bld 74 70 - 99 mg/dL    Comment: Glucose reference range applies only to samples taken after fasting for at least 8 hours.   BUN 36 (H) 6 - 20 mg/dL   Creatinine, Ser 1.53 (H) 0.61 - 1.24 mg/dL   Calcium 7.8 (L) 8.9 - 10.3 mg/dL   GFR, Estimated 52 (L) >60 mL/min    Comment:  (NOTE) Calculated using the CKD-EPI Creatinine Equation (2021)    Anion gap 8 5 - 15    Comment: Performed at [facility], [place] [address] [place], [place] [address]   DG Chest Port 1 View  Result Date: 12/13/2022 CLINICAL DATA:  Hypotension EXAM: PORTABLE CHEST 1 VIEW COMPARISON:  09/16/2022 FINDINGS: Right-sided chest port remains in place. Normal heart size. No focal airspace consolidation, pleural effusion, or pneumothorax. Known osseous metastatic disease, better seen on cross-sectional imaging. IMPRESSION: No active disease. Electronically Signed   By: Davina Poke D.O.   On: 12/13/2022 16:24    Pending Labs Unresulted Labs (From admission, onward)     Start     Ordered   12/20/22 0500  Creatinine, serum  (enoxaparin (LOVENOX)    CrCl >/= 30 ml/min)  Weekly,   R     Comments: while on enoxaparin therapy    12/13/22 2218   12/15/22 0500  CBC  Tomorrow morning,   R        12/14/22 0814   12/15/22 1224  Basic metabolic panel  Tomorrow morning,   R        12/14/22 0814            Vitals/Pain Today's Vitals   12/14/22 0845 12/14/22 0847 12/14/22 0848 12/14/22 1039  BP:      Pulse: 98 99    Resp:      Temp:   99.1 F (37.3 C)   TempSrc:   Oral   SpO2: 90% 93%    PainSc:    5     Isolation Precautions No active isolations  Medications Medications  0.9 %  sodium chloride infusion ( Intravenous New Bag/Given 12/13/22 1601)  acetaminophen (TYLENOL) tablet 1,000 mg (1,000 mg Oral Given 12/14/22 0940)  hydroxychloroquine (PLAQUENIL) tablet 200 mg (200 mg Oral Given 12/14/22 0941)  rosuvastatin (CRESTOR) tablet 20 mg (20 mg Oral Given 12/14/22 0941)  sertraline (ZOLOFT) tablet 100 mg (100 mg Oral Given 12/14/22 0942)  pantoprazole (PROTONIX) EC tablet 80 mg (80 mg Oral Given 12/14/22 0940)  ondansetron (ZOFRAN-ODT) disintegrating tablet 4 mg (4 mg Oral Given 12/13/22 2216)  prednisoLONE acetate (PRED FORTE) 1 % ophthalmic suspension 1 drop (has no  administration in time range)  oxyCODONE (Oxy IR/ROXICODONE) immediate release tablet 10 mg (10 mg Oral Given 12/14/22 0941)  enoxaparin (LOVENOX) injection 40 mg (40 mg Subcutaneous Given 12/13/22 2230)  traZODone (DESYREL) tablet 25 mg (has no administration in time range)  senna (SENOKOT) tablet 8.6 mg (8.6 mg Oral Given 12/14/22 0940)  magnesium oxide (MAG-OX) tablet 400 mg (400 mg Oral Given 12/14/22 0941)  sodium chloride 0.9 % bolus 1,000 mL (0 mLs Intravenous Stopped 12/13/22 1755)  ondansetron (ZOFRAN) injection 4 mg (4 mg Intravenous Given 12/13/22 1602)  sodium chloride 0.9 % bolus 1,000 mL (  0 mLs Intravenous Stopped 12/14/22 0908)    Mobility walks     Focused Assessments Cardiac Assessment Handoff:    No results found for: "CKTOTAL", "CKMB", "CKMBINDEX", "TROPONINI" No results found for: "DDIMER" Does the Patient currently have chest pain? No    R Recommendations: See Admitting Provider Note  Report given to:   Additional Notes:

## 2022-12-14 NOTE — ED Notes (Signed)
Pt repositioned in recliner and given heating pads for his back.

## 2022-12-14 NOTE — Progress Notes (Signed)
PROGRESS NOTE  Barry Gibson  YQM:[id number] DOB: 09-Jun-1961 DOA: 12/13/2022 PCP: Coral Spikes, DO   Brief Narrative:  Patient is a 62 year old male with history of hypertension, hyperlipidemia, rheumatoid arthritis, bladder cancer status post chemo, radiation treatment, cystectomy and ileal conduit placement was admitted from cancer center because he was found to be hypotensive, had near syncopal episode.  On presentation, he was hemodynamically stable but appeared dehydrated.  Lab work showed creatinine of 1.88.  Assessment & Plan:  Principal Problem:   AKI (acute kidney injury) ([place]) Active Problems:   Hyperlipidemia   Essential hypertension, benign   Bladder cancer (H[facility])   Dehydration, severe   Advanced care planning/counseling discussion  AKI: Likely secondary to dehydration.  Baseline creatinine is normal.  Presented with creatinine of 1.8.  Continue IV fluids.  Monitor BMP  History of hypertension: Blood pressure was soft on presentation, patient was lightheaded and had near syncopal episode.  Continue to hold antihypertensives.  On amlodipine and losartan at home  History of metastatic bladder cancer: Follows with oncology. status post chemo, radiation treatment, cystectomy and ileal conduit placement.  Mets to spine.  On chronic pain regimen.    Severe back pain: Complains of severe back pain.  Will request a physical therapy assessment.  He was recently started on OxyContin but it caused severe nausea, vomiting.  Currently on hold.  Currently on oxycodone only.  Will add Robaxin.  Will give him high-dose steroid 1 dose, will start on prednisone from tomorrow.Continue k pad  Hyperlipidemia: Continue home regimen  History of rheumatoid arthritis: On hydroxychloroquine           DVT prophylaxis:enoxaparin (LOVENOX) injection 40 mg Start: 12/13/22 2230     Code Status: DNR  Family Communication: None at the bedside  Patient status:Obs  Patient is from  :Home  Anticipated discharge BM:WUXL  Estimated DC date: 1 to 2 days   Consultants: None  Procedures: None  Antimicrobials:  Anti-infectives (From admission, onward)    Start     Dose/Rate Route Frequency Ordered Stop   12/13/22 2200  hydroxychloroquine (PLAQUENIL) tablet 200 mg        200 mg Oral 2 times daily 12/13/22 2151         Subjective: Patient seen and examined at bedside today.  Hemodynamically stable complains of severe back pain.  Blood pressure was soft but stable.  Denies lightheadedness or dizziness  Objective: Vitals:   12/14/22 0130 12/14/22 0400 12/14/22 0430 12/14/22 0547  BP: 105/69 101/68 104/79 114/63  Pulse: (!) 120 (!) 108 (!) 106 (!) 104  Resp: 19   20  Temp:    98 F (36.7 C)  TempSrc:      SpO2: 92% 90% 93% 94%    Intake/Output Summary (Last 24 hours) at 12/14/2022 0810 Last data filed at 12/14/2022 0500 Gross per 24 hour  Intake --  Output 800 ml  Net -800 ml   There were no vitals filed for this visit.  Examination:  General exam: Overall comfortable, not in distress HEENT: PERRL Respiratory system:  no wheezes or crackles  Cardiovascular system: S1 & S2 heard, RRR.  Gastrointestinal system: Abdomen is nondistended, soft and nontender.  Ileal conduit Central nervous system: Alert and oriented Extremities: No edema, no clubbing ,no cyanosis Skin: No rashes, no ulcers,no icterus     Data Reviewed: I have personally reviewed following labs and imaging studies  CBC: Recent Labs  Lab 12/13/22 1455  WBC 6.0  NEUTROABS 4.2  HGB 10.7*  HCT 33.5*  MCV 90.5  PLT 82*   Basic Metabolic Panel: Recent Labs  Lab 12/13/22 1455 12/14/22 0537  NA 135 133*  K 3.9 3.8  CL 108 109  CO2 17* 16*  GLUCOSE 95 74  BUN 50* 36*  CREATININE 1.88* 1.53*  CALCIUM 8.5* 7.8*     Recent Results (from the past 240 hour(s))  Culture, blood (Routine X 2) w Reflex to ID Panel     Status: None (Preliminary result)   Collection Time: 12/13/22   4:10 PM   Specimen: BLOOD LEFT HAND  Result Value Ref Range Status   Specimen Description   Final    BLOOD LEFT HAND Performed at [facility], [place] [address], [place], [place] [address]    Special Requests   Final    BOTTLES DRAWN AEROBIC AND ANAEROBIC Blood Culture adequate volume Performed at [facility] [address] [place], [place] [address]    Culture   Final    NO GROWTH < 12 HOURS Performed at [place] [address], [place], [place] [address]    Report Status PENDING  Incomplete  Culture, blood (Routine X 2) w Reflex to ID Panel     Status: None (Preliminary result)   Collection Time: 12/13/22  4:14 PM   Specimen: BLOOD  Result Value Ref Range Status   Specimen Description   Final    BLOOD RIGHT ANTECUBITAL Performed at [place] [address], [place], [place] [address]    Special Requests   Final    BOTTLES DRAWN AEROBIC AND ANAEROBIC Blood Culture adequate volume Performed at [place] [address], [place], [place] [address]    Culture   Final    NO GROWTH < 12 HOURS Performed at [place] [address] [place], [place] [address]    Report Status PENDING  Incomplete     Radiology Studies: DG Chest Port 1 View  Result Date: 12/13/2022 CLINICAL DATA:  Hypotension EXAM: PORTABLE CHEST 1 VIEW COMPARISON:  09/16/2022 FINDINGS: Right-sided chest port remains in place. Normal heart size. No focal airspace consolidation, pleural effusion, or pneumothorax. Known osseous metastatic disease, better seen on cross-sectional imaging. IMPRESSION: No active disease. Electronically Signed   By: Davina Poke D.O.   On: 12/13/2022 16:24    Scheduled Meds:  acetaminophen  1,000 mg Oral Daily   amLODipine  5 mg Oral Daily   dexamethasone  4 mg Oral BID   enoxaparin (LOVENOX) injection  40 mg Subcutaneous Q24H   hydroxychloroquine  200 mg Oral BID   losartan  25 mg Oral  Daily   magnesium oxide  400 mg Oral Daily   pantoprazole  80 mg Oral Daily   [START ON 12/16/2022] prednisoLONE acetate  1 drop Left Eye Once per day on Mon Thu   rosuvastatin  20 mg Oral Daily   senna  1 tablet Oral BID   sertraline  100 mg Oral Daily   Continuous Infusions:  sodium chloride 125 mL/hr at 12/13/22 1601     LOS: 0 days   Shelly Coss, MD [facility] P1/27/2024, 8:10 AM

## 2022-12-15 DIAGNOSIS — N179 Acute kidney failure, unspecified: Secondary | ICD-10-CM | POA: Diagnosis not present

## 2022-12-15 LAB — CBC
HCT: 25.3 % — ABNORMAL LOW (ref 39.0–52.0)
Hemoglobin: 8.3 g/dL — ABNORMAL LOW (ref 13.0–17.0)
MCH: 29.1 pg (ref 26.0–34.0)
MCHC: 32.8 g/dL (ref 30.0–36.0)
MCV: 88.8 fL (ref 80.0–100.0)
Platelets: 71 10*3/uL — ABNORMAL LOW (ref 150–400)
RBC: 2.85 MIL/uL — ABNORMAL LOW (ref 4.22–5.81)
RDW: 16.1 % — ABNORMAL HIGH (ref 11.5–15.5)
WBC: 4.9 10*3/uL (ref 4.0–10.5)
nRBC: 0.6 % — ABNORMAL HIGH (ref 0.0–0.2)

## 2022-12-15 LAB — BASIC METABOLIC PANEL
Anion gap: 9 (ref 5–15)
BUN: 32 mg/dL — ABNORMAL HIGH (ref 6–20)
CO2: 17 mmol/L — ABNORMAL LOW (ref 22–32)
Calcium: 8.4 mg/dL — ABNORMAL LOW (ref 8.9–10.3)
Chloride: 108 mmol/L (ref 98–111)
Creatinine, Ser: 1.17 mg/dL (ref 0.61–1.24)
GFR, Estimated: >60 mL/min (ref >60)
Glucose, Bld: 124 mg/dL — ABNORMAL HIGH (ref 70–99)
Potassium: 4.3 mmol/L (ref 3.5–5.1)
Sodium: 134 mmol/L — ABNORMAL LOW (ref 135–145)

## 2022-12-15 MED ORDER — HEPARIN SOD (PORK) LOCK FLUSH 100 UNIT/ML IV SOLN
500.0000 [IU] | INTRAVENOUS | Status: DC | PRN
  Filled 2022-12-15: qty 5

## 2022-12-15 MED ORDER — OXYCODONE HCL 10 MG PO TABS: 10.0000 mg | ORAL_TABLET | Freq: Four times a day (QID) | ORAL | 0 refills | Status: AC | PRN

## 2022-12-15 MED ORDER — DICLOFENAC SODIUM 1 % EX GEL
4.0000 g | Freq: Four times a day (QID) | CUTANEOUS | Status: DC
  Administered 2022-12-15: 4 g via TOPICAL
  Filled 2022-12-15: qty 100

## 2022-12-15 MED ORDER — PREDNISONE 5 MG PO TABS: 20.0000 mg | ORAL_TABLET | Freq: Every day | ORAL | Status: AC | PRN

## 2022-12-15 MED ORDER — LIDOCAINE 5 % EX PTCH: 1.0000 | MEDICATED_PATCH | Freq: Every day | CUTANEOUS | 0 refills | Status: DC

## 2022-12-15 MED ORDER — LIDOCAINE 5 % EX PTCH
1.0000 | MEDICATED_PATCH | Freq: Every day | CUTANEOUS | Status: DC
  Administered 2022-12-15: 1 via TRANSDERMAL
  Filled 2022-12-15: qty 1

## 2022-12-15 MED ORDER — PREDNISONE 10 MG PO TABS: 10.0000 mg | ORAL_TABLET | Freq: Every day | ORAL | 0 refills | Status: AC

## 2022-12-15 MED ORDER — DICLOFENAC SODIUM 1 % EX GEL: 4.0000 g | Freq: Four times a day (QID) | CUTANEOUS | 1 refills | Status: AC

## 2022-12-15 NOTE — TOC Transition Note (Signed)
Transition of Care [facility]) - CM/SW Discharge Note   Patient Details  Name: Barry Gibson MRN: [id number] Date of Birth: 05-24-1961  [facility]) CM/SW Contact:  Roseanne Kaufman, RN Phone Number: 12/15/2022, 1:20 PM   Clinical Narrative:  Per chart review PT recommended DME: rollong walker. This RNCM spoke with patient's wife Barnett Applebaum who reports they have several walkers that been inherited by other family member, declined RW.   Transportation at discharge: wife  No additional TOC needs       Final next level of care: Home/Self Care Barriers to Discharge: No Barriers Identified   Patient Goals and CMS Choice CMS Medicare.gov Compare Post Acute Care list provided to:: Patient Represenative (must comment) Chanson Teems (wife)) Choice offered to / list presented to : Spouse  Discharge Placement                         Discharge Plan and Services Additional resources added to the After Visit Summary for                  DME Arranged:  (DME orders, declined) DME Agency: NA       HH Arranged: NA HH Agency: NA        Social Determinants of Health (SDOH) Interventions SDOH Screenings   Food Insecurity: No Food Insecurity (12/14/2022)  Housing: Low Risk  (12/14/2022)  Transportation Needs: No Transportation Needs (12/14/2022)  Utilities: Not At Risk (12/14/2022)  Depression (PHQ2-9): Low Risk  (09/02/2022)  Tobacco Use: Medium Risk (12/13/2022)     Readmission Risk Interventions     No data to display

## 2022-12-15 NOTE — Discharge Summary (Addendum)
Physician Discharge Summary  Barry Gibson:[id number] DOB: [birth year] DOA: 12/13/2022  PCP: Coral Spikes, DO  Admit date: 12/13/2022 Discharge date: 12/15/2022  Admitted From: Home Disposition:  Home  Discharge Condition:Stable CODE STATUS: DNR Diet recommendation:  Regular  Brief/Interim Summary:  Patient is a 62 year old male with history of hypertension, hyperlipidemia, rheumatoid arthritis, bladder cancer status post chemo, radiation treatment, cystectomy and ileal conduit placement was admitted from cancer center because he was found to be hypotensive, had near syncopal episode.  On presentation, he was hemodynamically stable but appeared dehydrated.  Lab work showed creatinine of 1.88.  Started on IV fluids with resolution of AKI.  He was complaining of significant back pain on presentation, back pain has significantly improved today.  PT recommended no follow-up.  Medically stable for discharge home  Following problems were addressed during the hospitalization:  AKI: Likely secondary to dehydration.  Baseline creatinine is normal.  Presented with creatinine of 1.8.  Resolved with IV fluids  History of hypertension: Blood pressure was soft on presentation, patient was lightheaded and had near syncopal episode.  Continue to hold antihypertensives.  On amlodipine and losartan at home   History of metastatic bladder cancer: Follows with oncology. status post chemo, radiation treatment, cystectomy and ileal conduit placement.  Mets to spine.  On chronic pain regimen.     Severe back pain: Complained of severe back pain.  He was recently started on OxyContin but it caused severe nausea, vomiting.  Treated with a steroid.  He will be discharged on tapering dose of prednisone.  PT did not recommend follow-up.Added lidocaine patch, Voltaren gel on discharge medications  Hyperlipidemia: Continue home regimen   History of rheumatoid arthritis: On hydroxychloroquine         Discharge Diagnoses:  Principal Problem:   AKI (acute kidney injury) ([place]) Active Problems:   Hyperlipidemia   Essential hypertension, benign   Bladder cancer ([place])   Dehydration, severe   Advanced care planning/counseling discussion    Discharge Instructions  Discharge Instructions     Diet general   Complete by: As directed    Discharge instructions   Complete by: As directed    1)Please take prescribed medications as instructed 2)Follow up with your PCP and oncologist 3)You blood pressure has been noted to be on the lower side during this hospitalization.  We have held your blood pressure medications.  Monitor blood pressure at home.  If her blood pressure starts going up again, you may need to restart those medications   Increase activity slowly   Complete by: As directed       Allergies as of 12/15/2022       Reactions   Ace Inhibitors Cough   Diflucan [fluconazole] Other (See Comments)   Irritated ulcers   Morphine Nausea And Vomiting        Medication List     STOP taking these medications    amLODipine 5 MG tablet Commonly known as: NORVASC   HYDROcodone-acetaminophen 5-325 MG tablet Commonly known as: Norco   losartan 25 MG tablet Commonly known as: COZAAR   morphine 15 MG 12 hr tablet Commonly known as: MS CONTIN       TAKE these medications    acetaminophen 650 MG CR tablet Commonly known as: TYLENOL Take 1,300 mg by mouth every morning.   dexamethasone 4 MG tablet Commonly known as: DECADRON Take 1 tablet (4 mg total) by mouth 2 (two) times daily. Take for 3 days after each chemotherapy treatment  diclofenac Sodium 1 % Gel Commonly known as: VOLTAREN Apply 4 g topically 4 (four) times daily. Apply to knees   hydroxychloroquine 200 MG tablet Commonly known as: PLAQUENIL Take 200 mg by mouth 2 (two) times daily.   lidocaine 5 % Commonly known as: LIDODERM Place 1 patch onto the skin daily. Remove & Discard patch within 12  hours or as directed by MD   multivitamin with minerals Tabs tablet Take 1 tablet by mouth daily.   omeprazole 40 MG capsule Commonly known as: PRILOSEC Take 40 mg by mouth daily.   ondansetron 4 MG disintegrating tablet Commonly known as: ZOFRAN-ODT Take 1 tablet (4 mg total) by mouth every 8 (eight) hours as needed for nausea or vomiting.   Oxycodone HCl 10 MG Tabs Take 1 tablet (10 mg total) by mouth every 6 (six) hours as needed for breakthrough pain.   prednisoLONE acetate 1 % ophthalmic suspension Commonly known as: PRED FORTE Place 1 drop into the left eye 2 (two) times a week. Instill 1 drop into left eye Monday and Friday.   predniSONE 10 MG tablet Commonly known as: DELTASONE Take 1 tablet (10 mg total) by mouth daily. Take 4 pills daily for 3 days, 3 pills daily for 3 days, 2 pills daily for 3 days, 1 pill daily for 3 days then stop What changed: You were already taking a medication with the same name, and this prescription was added. Make sure you understand how and when to take each.   predniSONE 5 MG tablet Commonly known as: DELTASONE Take 4 tablets (20 mg total) by mouth daily as needed (flare up). Can resume after finishing the tapering dose What changed: additional instructions   promethazine 25 MG tablet Commonly known as: PHENERGAN Take 1 tablet (25 mg total) by mouth every 6 (six) hours as needed for nausea or vomiting.   rosuvastatin 20 MG tablet Commonly known as: CRESTOR Take 1 tablet (20 mg total) by mouth daily.   sertraline 100 MG tablet Commonly known as: ZOLOFT Take 1 tablet (100 mg total) by mouth daily.               Durable Medical Equipment  (From admission, onward)           Start     Ordered   12/15/22 1004  For home use only DME Walker rolling  Once       Question Answer Comment  Walker: With 5 Inch Wheels   Patient needs a walker to treat with the following condition Balance disorder      12/15/22 1003             Follow-up Information     Coral Spikes, DO. Schedule an appointment as soon as possible for a visit in 1 week(s).   Specialty: Family Medicine Contact information: [place] [address] [phone]                Allergies  Allergen Reactions   Ace Inhibitors Cough   Diflucan [Fluconazole] Other (See Comments)    Irritated ulcers   Morphine Nausea And Vomiting    Consultations: None   Procedures/Studies: DG Chest Port 1 View  Result Date: 12/13/2022 CLINICAL DATA:  Hypotension EXAM: PORTABLE CHEST 1 VIEW COMPARISON:  09/16/2022 FINDINGS: Right-sided chest port remains in place. Normal heart size. No focal airspace consolidation, pleural effusion, or pneumothorax. Known osseous metastatic disease, better seen on cross-sectional imaging. IMPRESSION: No active disease. Electronically Signed  By: Davina Poke D.O.   On: 12/13/2022 16:24   MR THORACIC SPINE W WO CONTRAST  Result Date: 12/11/2022 CLINICAL DATA:  Malignant neoplasm of urinary bladder, unspecified site ([place]) C67.9 (ICD-10-CM). Metastatic disease evaluation. EXAM: MRI THORACIC AND LUMBAR SPINE WITHOUT AND WITH CONTRAST TECHNIQUE: Multiplanar and multiecho pulse sequences of the thoracic and lumbar spine were obtained without and with intravenous contrast. CONTRAST:  69m GADAVIST GADOBUTROL 1 MMOL/ML IV SOLN COMPARISON:  CT chest abdomen and pelvis November 21, 2022. FINDINGS: MRI THORACIC SPINE FINDINGS Alignment:  Physiologic. Vertebrae: Diffuse T1 hypointensity throughout the thoracic vertebrae and visualized portion of the ribs with heterogeneous signal on T2 and STIR and heterogeneous diffuse contrast enhancement, consistent with diffuse metastatic disease to the bones. T1 localizer images of the cervical spine also show diffuse T1 signal abnormality. There is no associated fracture or retropulsion. Cord:  Normal signal and morphology. Paraspinal and other soft tissues: Cholelithiasis.  Minimal bilateral pleural effusions. Disc levels: Tiny posterior disc protrusions are seen at T5-6, T6-7 and T7-8. No significant spinal canal or neural foraminal stenosis at any level. MRI LUMBAR SPINE FINDINGS Segmentation:  Standard. Alignment:  Physiologic. Vertebrae: Diffuse T1 hypointensity throughout the lumbar spine, sacral and visualized pelvic bones with heterogeneous signal on T2 and STIR and heterogeneous diffuse contrast enhancement, consistent with diffuse metastatic disease to the bones. There is no associated fracture or retropulsion. Conus medullaris: Extends to the L1 level and appears normal. Paraspinal and other soft tissues: Stable 4 cm infrarenal aortic aneurysm. Disc levels: T12-L1, L1-2 and L2-3: No spinal canal or neural foraminal stenosis. L3-4: Disc bulge and mild facet degenerative changes with ligamentum flavum redundancy resulting in mild spinal canal stenosis with moderate narrowing of the bilateral subarticular zones and moderate bilateral neural foraminal narrowing. L4-5: Disc bulge, mild-to-moderate facet degenerative changes and mild ligamentum flavum redundancy resulting in mild spinal canal stenosis with mild narrowing of the bilateral subarticular zones, moderate right and mild left neural foraminal narrowing. L5-S1: Left central disc protrusion and mild facet degenerative changes resulting in moderate narrowing of the left subarticular zone, mild right and moderate left neural foraminal narrowing. IMPRESSION: 1. Diffuse osseous metastatic disease to the spine, ribs, sacrum and visualized pelvic bones. No associated fracture or retropulsion. 2. Degenerative changes of the lumbar spine with mild spinal canal stenosis at L3-4 and L4-5 and moderate narrowing of the bilateral subarticular zones and moderate bilateral neural foraminal narrowing at L3-4. 3. Moderate narrowing of the left subarticular zone and moderate left neural foraminal narrowing at L5-S1. 4. Cholelithiasis. 5.  Stable 4 cm infrarenal aortic aneurysm. Electronically Signed   By: KPedro EarlsM.D.   On: 12/11/2022 09:20   MR Lumbar Spine W Wo Contrast  Result Date: 12/11/2022 CLINICAL DATA:  Malignant neoplasm of urinary bladder, unspecified site ([place] C67.9 (ICD-10-CM). Metastatic disease evaluation. EXAM: MRI THORACIC AND LUMBAR SPINE WITHOUT AND WITH CONTRAST TECHNIQUE: Multiplanar and multiecho pulse sequences of the thoracic and lumbar spine were obtained without and with intravenous contrast. CONTRAST:  810mGADAVIST GADOBUTROL 1 MMOL/ML IV SOLN COMPARISON:  CT chest abdomen and pelvis November 21, 2022. FINDINGS: MRI THORACIC SPINE FINDINGS Alignment:  Physiologic. Vertebrae: Diffuse T1 hypointensity throughout the thoracic vertebrae and visualized portion of the ribs with heterogeneous signal on T2 and STIR and heterogeneous diffuse contrast enhancement, consistent with diffuse metastatic disease to the bones. T1 localizer images of the cervical spine also show diffuse T1 signal abnormality. There is no associated fracture or retropulsion. Cord:  Normal signal and morphology. Paraspinal and other soft tissues: Cholelithiasis. Minimal bilateral pleural effusions. Disc levels: Tiny posterior disc protrusions are seen at T5-6, T6-7 and T7-8. No significant spinal canal or neural foraminal stenosis at any level. MRI LUMBAR SPINE FINDINGS Segmentation:  Standard. Alignment:  Physiologic. Vertebrae: Diffuse T1 hypointensity throughout the lumbar spine, sacral and visualized pelvic bones with heterogeneous signal on T2 and STIR and heterogeneous diffuse contrast enhancement, consistent with diffuse metastatic disease to the bones. There is no associated fracture or retropulsion. Conus medullaris: Extends to the L1 level and appears normal. Paraspinal and other soft tissues: Stable 4 cm infrarenal aortic aneurysm. Disc levels: T12-L1, L1-2 and L2-3: No spinal canal or neural foraminal stenosis. L3-4: Disc  bulge and mild facet degenerative changes with ligamentum flavum redundancy resulting in mild spinal canal stenosis with moderate narrowing of the bilateral subarticular zones and moderate bilateral neural foraminal narrowing. L4-5: Disc bulge, mild-to-moderate facet degenerative changes and mild ligamentum flavum redundancy resulting in mild spinal canal stenosis with mild narrowing of the bilateral subarticular zones, moderate right and mild left neural foraminal narrowing. L5-S1: Left central disc protrusion and mild facet degenerative changes resulting in moderate narrowing of the left subarticular zone, mild right and moderate left neural foraminal narrowing. IMPRESSION: 1. Diffuse osseous metastatic disease to the spine, ribs, sacrum and visualized pelvic bones. No associated fracture or retropulsion. 2. Degenerative changes of the lumbar spine with mild spinal canal stenosis at L3-4 and L4-5 and moderate narrowing of the bilateral subarticular zones and moderate bilateral neural foraminal narrowing at L3-4. 3. Moderate narrowing of the left subarticular zone and moderate left neural foraminal narrowing at L5-S1. 4. Cholelithiasis. 5. Stable 4 cm infrarenal aortic aneurysm. Electronically Signed   By: Pedro Earls M.D.   On: 12/11/2022 09:20   CT CHEST ABDOMEN PELVIS W CONTRAST  Result Date: 11/22/2022 CLINICAL DATA:  Invasive bladder cancer. Evaluate treatment response. * Tracking Code: BO * EXAM: CT CHEST, ABDOMEN, AND PELVIS WITH CONTRAST TECHNIQUE: Multidetector CT imaging of the chest, abdomen and pelvis was performed following the standard protocol during bolus administration of intravenous contrast. RADIATION DOSE REDUCTION: This exam was performed according to the departmental dose-optimization program which includes automated exposure control, adjustment of the mA and/or kV according to patient size and/or use of iterative reconstruction technique. CONTRAST:  130m OMNIPAQUE  IOHEXOL 300 MG/ML  SOLN COMPARISON:  09/16/2022 FINDINGS: CT CHEST FINDINGS Cardiovascular: Right Port-A-Cath tip high right atrium. Aortic atherosclerosis. Normal heart size, without pericardial effusion. Left main, lad, left circumflex coronary artery calcification. No central pulmonary embolism, on this non-dedicated study. Mediastinum/Nodes: No supraclavicular adenopathy. No axillary adenopathy. No mediastinal or hilar adenopathy. Lungs/Pleura: No pleural fluid.  Mild centrilobular emphysema. Musculoskeletal: Development of sclerotic and lytic osseous lesions relatively diffusely throughout the marrow space. Example sclerotic lesion of 1.8 cm within the T9 vertebral body on 114/6. CT ABDOMEN PELVIS FINDINGS Hepatobiliary: Normal liver. 7 mm gallstone without acute cholecystitis or biliary duct dilatation. Pancreas: Normal, without mass or ductal dilatation. Spleen: Normal in size, without focal abnormality. Adrenals/Urinary Tract: Normal adrenal glands. Interpolar right renal 1.7 cm cyst. Bilateral renal cysts of maximally 1.7 cm . In the absence of clinically indicated signs/symptoms require(s) no independent follow-up. Mild left renal cortical thinning. Moderate renal collecting system opacification on delayed images. Status post cystectomy and ileal conduit creation, without acute complication. Stomach/Bowel: Normal stomach, without wall thickening. Scattered colonic diverticula. Normal terminal ileum and appendix. Nonobstructivea small bowel again enters a left paramidline  ventral abdominal wall hernia on 84/2. Vascular/Lymphatic: Infrarenal abdominal aortic aneurysm is mild at 4.0 cm and not significantly changed. No extension into the iliacs. No abdominal adenopathy. Index right external iliac node measures 5 mm on 109/2 versus 14 mm on the prior. Right inguinal node measures 4 mm on 113/2 versus 15 mm on the prior. No new pelvic adenopathy. Reproductive: Prostatectomy without local recurrence. Other: No  significant free fluid. A separate, more cephalad fat containing hernia is diminutive on 75/2. The right upper quadrant omental nodularity has resolved, with only minimal linear density remaining on 73/2. No new peritoneal metastasis identified. Musculoskeletal: New heterogeneous sclerosis throughout the marrow, including relatively diffusely within the pelvis on 101/2. IMPRESSION: 1. Relatively diffuse osseous metastasis, sclerotic less than lytic, newly apparent since 09/16/2022. This could represent development of osseous metastasis or healing of previously CT occult metastasis. 2. Otherwise, response to therapy of pelvic nodal and peritoneal metastasis. 3. No extra osseous metastasis within the chest. 4. Incidental findings, including: Aortic atherosclerosis (ICD10-I70.0), coronary artery atherosclerosis and emphysema (ICD10-J43.9). Cholelithiasis. Fat and nonobstructive small bowel containing ventral abdominal wall hernias. Electronically Signed   By: Abigail Miyamoto M.D.   On: 11/22/2022 13:03      Subjective: Patient seen and examined at bedside today.  Hemodynamically stable for discharge  Discharge Exam: Vitals:   12/15/22 0032 12/15/22 0504  BP: 124/74 114/76  Pulse: 95 87  Resp: 16 17  Temp: 98.3 F (36.8 C) 98.1 F (36.7 C)  SpO2: 92% 94%   Vitals:   12/14/22 1650 12/14/22 2043 12/15/22 0032 12/15/22 0504  BP: 104/66 123/73 124/74 114/76  Pulse: 92 93 95 87  Resp: '20 16 16 17  '$ Temp: 98 F (36.7 C) 98.9 F (37.2 C) 98.3 F (36.8 C) 98.1 F (36.7 C)  TempSrc: Oral Oral Oral Oral  SpO2: (!) 88% 94% 92% 94%  Height:        General: Pt is alert, awake, not in acute distress Cardiovascular: RRR, S1/S2 +, no rubs, no gallops Respiratory: CTA bilaterally, no wheezing, no rhonchi Abdominal: Soft, NT, ND, bowel sounds + Extremities: no edema, no cyanosis    The results of significant diagnostics from this hospitalization (including imaging, microbiology, ancillary and  laboratory) are listed below for reference.     Microbiology: Recent Results (from the past 240 hour(s))  Culture, blood (Routine X 2) w Reflex to ID Panel     Status: None (Preliminary result)   Collection Time: 12/13/22  4:10 PM   Specimen: BLOOD LEFT HAND  Result Value Ref Range Status   Specimen Description   Final    BLOOD LEFT HAND Performed at [facility], [place] [address], [place], [place] [address]    Special Requests   Final    BOTTLES DRAWN AEROBIC AND ANAEROBIC Blood Culture adequate volume Performed at [facility] [address] [place], [place] [address]    Culture   Final    NO GROWTH 2 DAYS Performed at [facility] [address], [place], [place] [address]    Report Status PENDING  Incomplete  Culture, blood (Routine X 2) w Reflex to ID Panel     Status: None (Preliminary result)   Collection Time: 12/13/22  4:14 PM   Specimen: BLOOD  Result Value Ref Range Status   Specimen Description   Final    BLOOD RIGHT ANTECUBITAL Performed at [facility] [address], [place],  [address]  Special Requests   Final    BOTTLES DRAWN AEROBIC AND ANAEROBIC Blood Culture adequate volume Performed at [facility] [address] [place], [place] [address]    Culture   Final    NO GROWTH 2 DAYS Performed at [facility] [address], [place], [place] [address]    Report Status PENDING  Incomplete     Labs: BNP (last 3 results) Recent Labs    09/16/22 1441  BNP 27.0   Basic Metabolic Panel: Recent Labs  Lab 12/13/22 1455 12/14/22 0537 12/15/22 0504  NA 135 133* 134*  K 3.9 3.8 4.3  CL 108 109 108  CO2 17* 16* 17*  GLUCOSE 95 74 124*  BUN 50* 36* 32*  CREATININE 1.88* 1.53* 1.17  CALCIUM 8.5* 7.8* 8.4*   Liver Function Tests: Recent Labs  Lab 12/13/22 1455  AST 42*  ALT 13  ALKPHOS 581*  BILITOT 0.6  PROT 6.6  ALBUMIN 3.5   Recent Labs  Lab  12/13/22 1455  LIPASE 34   No results for input(s): "AMMONIA" in the last 168 hours. CBC: Recent Labs  Lab 12/13/22 1455 12/15/22 0504  WBC 6.0 4.9  NEUTROABS 4.2  --   HGB 10.7* 8.3*  HCT 33.5* 25.3*  MCV 90.5 88.8  PLT 82* 71*   Cardiac Enzymes: No results for input(s): "CKTOTAL", "CKMB", "CKMBINDEX", "TROPONINI" in the last 168 hours. BNP: Invalid input(s): "POCBNP" CBG: No results for input(s): "GLUCAP" in the last 168 hours. D-Dimer No results for input(s): "DDIMER" in the last 72 hours. Hgb A1c No results for input(s): "HGBA1C" in the last 72 hours. Lipid Profile No results for input(s): "CHOL", "HDL", "LDLCALC", "TRIG", "CHOLHDL", "LDLDIRECT" in the last 72 hours. Thyroid function studies No results for input(s): "TSH", "T4TOTAL", "T3FREE", "THYROIDAB" in the last 72 hours.  Invalid input(s): "FREET3" Anemia work up No results for input(s): "VITAMINB12", "FOLATE", "FERRITIN", "TIBC", "IRON", "RETICCTPCT" in the last 72 hours. Urinalysis    Component Value Date/Time   COLORURINE YELLOW 12/13/2022 1820   APPEARANCEUR TURBID (A) 12/13/2022 1820   APPEARANCEUR Hazy (A) 09/22/2020 1521   LABSPEC 1.015 12/13/2022 1820   PHURINE 5.0 12/13/2022 1820   GLUCOSEU NEGATIVE 12/13/2022 1820   HGBUR LARGE (A) 12/13/2022 1820   BILIRUBINUR NEGATIVE 12/13/2022 1820   BILIRUBINUR Negative 09/22/2020 1521   KETONESUR NEGATIVE 12/13/2022 1820   PROTEINUR >=300 (A) 12/13/2022 1820   UROBILINOGEN 0.2 06/09/2020 1536   NITRITE NEGATIVE 12/13/2022 1820   LEUKOCYTESUR MODERATE (A) 12/13/2022 1820   Sepsis Labs Recent Labs  Lab 12/13/22 1455 12/15/22 0504  WBC 6.0 4.9   Microbiology Recent Results (from the past 240 hour(s))  Culture, blood (Routine X 2) w Reflex to ID Panel     Status: None (Preliminary result)   Collection Time: 12/13/22  4:10 PM   Specimen: BLOOD LEFT HAND  Result Value Ref Range Status   Specimen Description   Final    BLOOD LEFT HAND Performed at  [facility], [place] [address] [place], [place] [address]    Special Requests   Final    BOTTLES DRAWN AEROBIC AND ANAEROBIC Blood Culture adequate volume Performed at [facility] [address] [place], [place] [address]    Culture   Final    NO GROWTH 2 DAYS Performed at [facility] [address], [place], [place] [address]    Report Status PENDING  Incomplete  Culture, blood (Routine X 2) w  Reflex to ID Panel     Status: None (Preliminary result)   Collection Time: 12/13/22  4:14 PM   Specimen: BLOOD  Result Value Ref Range Status   Specimen Description   Final    BLOOD RIGHT ANTECUBITAL Performed at [facility] [address], [place], [place] [address]    Special Requests   Final    BOTTLES DRAWN AEROBIC AND ANAEROBIC Blood Culture adequate volume Performed at [facility] [address] [place], [place] [address]    Culture   Final    NO GROWTH 2 DAYS Performed at [facility] [address] [place], [place] [address]    Report Status PENDING  Incomplete    Please note: You were cared for by a hospitalist during your hospital stay. Once you are discharged, your primary care physician will handle any further medical issues. Please note that NO REFILLS for any discharge medications will be authorized once you are discharged, as it is imperative that you return to your primary care physician (or establish a relationship with a primary care physician if you do not have one) for your post hospital discharge needs so that they can reassess your need for medications and monitor your lab values.    Time coordinating discharge: 40 minutes  SIGNED:   Shelly Coss, MD  [facility] 12/15/2022, 1:52 PM Pager [phone]  If 7PM-7AM, please contact night-coverage [email or web address] Password [email or web address]

## 2022-12-15 NOTE — Evaluation (Signed)
Physical Therapy Evaluation & discharge Patient Details Name: Barry Gibson MRN: [id number] DOB: Aug 27, 1961 Today's Date: 12/15/2022  History of Present Illness  Patient is a 62 year old male with history of hypertension, hyperlipidemia, rheumatoid arthritis, bladder cancer status post chemo, radiation treatment, cystectomy and ileal conduit placement was admitted from cancer center because he was found to be hypotensive, had near syncopal episode.  On presentation, he was hemodynamically stable but appeared dehydrated.  Lab work showed creatinine of 1.88. He complains of back and B knee pain (R worse than L).  Clinical Impression  Pt admitted with above diagnosis. While PT was in room, MD came in and is planning of d/c pt home today.  Reviewed home safety and RW recommendations, as well as log rolling, and having assistance with entry into the house and which car to choose to go home in. Recommend RW for home use. Will d/c PT at this time.        Recommendations for follow up therapy are one component of a multi-disciplinary discharge planning process, led by the attending physician.  Recommendations may be updated based on patient status, additional functional criteria and insurance authorization.  Follow Up Recommendations No PT follow up      Assistance Recommended at Discharge Set up Supervision/Assistance  Patient can return home with the following  A little help with walking and/or transfers;A little help with bathing/dressing/bathroom;Help with stairs or ramp for entrance    Equipment Recommendations Rolling walker (2 wheels)  Recommendations for Other Services       Functional Status Assessment Patient has had a recent decline in their functional status and demonstrates the ability to make significant improvements in function in a reasonable and predictable amount of time.     Precautions / Restrictions Precautions Precautions: None Restrictions Weight Bearing  Restrictions: No      Mobility  Bed Mobility Overal bed mobility: Needs Assistance Bed Mobility: Rolling, Sidelying to Sit Rolling: Min assist Sidelying to sit: Min assist       General bed mobility comments: MIN A for rolling R and then to get upright.  Says wife will be able to help him at that level.    Transfers Overall transfer level: Needs assistance Equipment used: Rolling walker (2 wheels) Transfers: Sit to/from Stand Sit to Stand: Supervision, From elevated surface           General transfer comment: cues for hand placement only.  bed slightly elevated and pt reports his bed does elevate.    Ambulation/Gait Ambulation/Gait assistance: Supervision Gait Distance (Feet): 20 Feet Assistive device: Rolling walker (2 wheels) Gait Pattern/deviations: Step-through pattern       General Gait Details: cues for using RW to unweight knees for pain management.  Pt could have walked farther, but is being d/c today and wanted to rest and save a bit of energy.  Stairs            Wheelchair Mobility    Modified Rankin (Stroke Patients Only)       Balance Overall balance assessment: No apparent balance deficits (not formally assessed)                                           Pertinent Vitals/Pain Pain Assessment Pain Assessment: 0-10 Pain Score: 6  Pain Location: low back pain 5-6/10, knees 6/10. Pain Descriptors / Indicators: Constant Pain Intervention(s): Premedicated before session  Home Living Family/patient expects to be discharged to:: Private residence Living Arrangements: Spouse/significant other Available Help at Discharge: Family;Available 24 hours/day Type of Home: House Home Access: Stairs to enter Entrance Stairs-Rails: None Entrance Stairs-Number of Steps: 2   Home Layout: Able to live on main level with bedroom/bathroom Home Equipment: None      Prior Function Prior Level of Function : Independent/Modified  Independent                     Hand Dominance        Extremity/Trunk Assessment   Upper Extremity Assessment Upper Extremity Assessment: LUE deficits/detail LUE Deficits / Details: Middle 3 fingers with birth deformity    Lower Extremity Assessment Lower Extremity Assessment: Overall WFL for tasks assessed (but complaints of pain)       Communication   Communication: No difficulties  Cognition Arousal/Alertness: Awake/alert Behavior During Therapy: WFL for tasks assessed/performed Overall Cognitive Status: Within Functional Limits for tasks assessed                                          General Comments General comments (skin integrity, edema, etc.): did not perform stairs, but stated one of his kids will be able to help him and he was not concerned.    Exercises Total Joint Exercises Knee Flexion: AROM, Both   Assessment/Plan    PT Assessment Patient does not need any further PT services  PT Problem List         PT Treatment Interventions      PT Goals (Current goals can be found in the Care Plan section)  Acute Rehab PT Goals Patient Stated Goal: home PT Goal Formulation: All assessment and education complete, DC therapy    Frequency       Co-evaluation               AM-PAC PT "6 Clicks" Mobility  Outcome Measure Help needed turning from your back to your side while in a flat bed without using bedrails?: A Little Help needed moving from lying on your back to sitting on the side of a flat bed without using bedrails?: A Little Help needed moving to and from a bed to a chair (including a wheelchair)?: A Little Help needed standing up from a chair using your arms (e.g., wheelchair or bedside chair)?: None Help needed to walk in hospital room?: A Little Help needed climbing 3-5 steps with a railing? : A Little 6 Click Score: 19    End of Session Equipment Utilized During Treatment: Gait belt Activity Tolerance: Patient  tolerated treatment well Patient left: in chair;with call bell/phone within reach Nurse Communication: Mobility status PT Visit Diagnosis: Pain Pain - Right/Left: Right Pain - part of body: Knee    Time: 8889-1694 PT Time Calculation (min) (ACUTE ONLY): 33 min   Charges:   PT Evaluation $PT Eval Moderate Complexity: 1 Mod PT Treatments $Therapeutic Activity: 8-22 mins        Lydia Guiles., PT Office [phone] Acute Rehab 12/15/2022   Galen Manila 12/15/2022, 9:55 AM

## 2022-12-15 NOTE — Plan of Care (Signed)

## 2022-12-15 NOTE — Progress Notes (Signed)
Pt discharged home with spouse in stable condition. Discharge instructions given. Scripts sent to pharmacy of choice. No immediate questions or concerns at this time . DC'd from unit wheelchair.

## 2022-12-16 ENCOUNTER — Other Ambulatory Visit: Payer: Self-pay | Admitting: Hematology

## 2022-12-16 ENCOUNTER — Other Ambulatory Visit: Payer: Self-pay | Admitting: Nurse Practitioner

## 2022-12-16 ENCOUNTER — Ambulatory Visit: Payer: BC Managed Care – PPO

## 2022-12-16 ENCOUNTER — Telehealth: Payer: Self-pay | Admitting: Hematology

## 2022-12-16 DIAGNOSIS — C7951 Secondary malignant neoplasm of bone: Secondary | ICD-10-CM

## 2022-12-16 MED ORDER — LIDOCAINE 5 % EX OINT: 1.0000 | TOPICAL_OINTMENT | Freq: Three times a day (TID) | CUTANEOUS | 0 refills | Status: AC | PRN

## 2022-12-16 NOTE — Telephone Encounter (Signed)
Contacted patient to scheduled appointments. Patient is aware of appointments that are scheduled.   

## 2022-12-17 ENCOUNTER — Ambulatory Visit: Payer: BC Managed Care – PPO | Admitting: Radiation Oncology

## 2022-12-17 ENCOUNTER — Inpatient Hospital Stay (HOSPITAL_BASED_OUTPATIENT_CLINIC_OR_DEPARTMENT_OTHER): Payer: BC Managed Care – PPO | Admitting: Nurse Practitioner

## 2022-12-17 ENCOUNTER — Other Ambulatory Visit: Payer: Self-pay

## 2022-12-17 ENCOUNTER — Encounter: Payer: Self-pay | Admitting: Nurse Practitioner

## 2022-12-17 ENCOUNTER — Ambulatory Visit: Payer: BC Managed Care – PPO

## 2022-12-17 ENCOUNTER — Telehealth: Payer: Self-pay | Admitting: *Deleted

## 2022-12-17 ENCOUNTER — Inpatient Hospital Stay (HOSPITAL_BASED_OUTPATIENT_CLINIC_OR_DEPARTMENT_OTHER): Payer: BC Managed Care – PPO | Admitting: Physician Assistant

## 2022-12-17 ENCOUNTER — Ambulatory Visit
Admission: RE | Admit: 2022-12-17 | Discharge: 2022-12-17 | Disposition: A | Discharge status: Home / Self Care | Payer: BC Managed Care – PPO | Source: Ambulatory Visit | Attending: Radiation Oncology | Admitting: Radiation Oncology

## 2022-12-17 ENCOUNTER — Inpatient Hospital Stay: Payer: BC Managed Care – PPO

## 2022-12-17 ENCOUNTER — Other Ambulatory Visit: Payer: Self-pay | Admitting: Physician Assistant

## 2022-12-17 VITALS — BP 124/83 | HR 80 | Temp 98.8°F | Resp 16 | Wt 190.2 lb

## 2022-12-17 VITALS — BP 142/87 | HR 79 | Resp 16

## 2022-12-17 DIAGNOSIS — C7951 Secondary malignant neoplasm of bone: Secondary | ICD-10-CM

## 2022-12-17 DIAGNOSIS — G893 Neoplasm related pain (acute) (chronic): Secondary | ICD-10-CM

## 2022-12-17 DIAGNOSIS — Z515 Encounter for palliative care: Secondary | ICD-10-CM

## 2022-12-17 DIAGNOSIS — Z95828 Presence of other vascular implants and grafts: Secondary | ICD-10-CM | POA: Diagnosis not present

## 2022-12-17 DIAGNOSIS — C679 Malignant neoplasm of bladder, unspecified: Secondary | ICD-10-CM

## 2022-12-17 DIAGNOSIS — R630 Anorexia: Secondary | ICD-10-CM

## 2022-12-17 DIAGNOSIS — F419 Anxiety disorder, unspecified: Secondary | ICD-10-CM

## 2022-12-17 DIAGNOSIS — R110 Nausea: Secondary | ICD-10-CM

## 2022-12-17 DIAGNOSIS — K5900 Constipation, unspecified: Secondary | ICD-10-CM

## 2022-12-17 DIAGNOSIS — K5903 Drug induced constipation: Secondary | ICD-10-CM | POA: Diagnosis not present

## 2022-12-17 LAB — RAD ONC ARIA SESSION SUMMARY
Course Elapsed Days: 12
Plan Fractions Treated to Date: 8
Plan Prescribed Dose Per Fraction: 3 Gy
Plan Total Fractions Prescribed: 10
Plan Total Prescribed Dose: 30 Gy
Reference Point Dosage Given to Date: 24 Gy
Reference Point Session Dosage Given: 3 Gy
Session Number: 8

## 2022-12-17 LAB — CMP (CANCER CENTER ONLY)
ALT: 9 U/L (ref 0–44)
AST: 29 U/L (ref 15–41)
Albumin: 3.4 g/dL — ABNORMAL LOW (ref 3.5–5.0)
Alkaline Phosphatase: 503 U/L — ABNORMAL HIGH (ref 38–126)
Anion gap: 9 (ref 5–15)
BUN: 28 mg/dL — ABNORMAL HIGH (ref 6–20)
CO2: 20 mmol/L — ABNORMAL LOW (ref 22–32)
Calcium: 8.6 mg/dL — ABNORMAL LOW (ref 8.9–10.3)
Chloride: 107 mmol/L (ref 98–111)
Creatinine: 1.14 mg/dL (ref 0.61–1.24)
GFR, Estimated: >60 mL/min (ref >60)
Glucose, Bld: 109 mg/dL — ABNORMAL HIGH (ref 70–99)
Potassium: 4.1 mmol/L (ref 3.5–5.1)
Sodium: 136 mmol/L (ref 135–145)
Total Bilirubin: 0.4 mg/dL (ref 0.3–1.2)
Total Protein: 5.9 g/dL — ABNORMAL LOW (ref 6.5–8.1)

## 2022-12-17 LAB — CBC WITH DIFFERENTIAL (CANCER CENTER ONLY)
Abs Immature Granulocytes: 0.43 10*3/uL — ABNORMAL HIGH (ref 0.00–0.07)
Basophils Absolute: 0 10*3/uL (ref 0.0–0.1)
Basophils Relative: 1 %
Eosinophils Absolute: 0.1 10*3/uL (ref 0.0–0.5)
Eosinophils Relative: 2 %
HCT: 26.3 % — ABNORMAL LOW (ref 39.0–52.0)
Hemoglobin: 9 g/dL — ABNORMAL LOW (ref 13.0–17.0)
Immature Granulocytes: 8 %
Lymphocytes Relative: 12 %
Lymphs Abs: 0.7 10*3/uL (ref 0.7–4.0)
MCH: 29.6 pg (ref 26.0–34.0)
MCHC: 34.2 g/dL (ref 30.0–36.0)
MCV: 86.5 fL (ref 80.0–100.0)
Monocytes Absolute: 0.4 10*3/uL (ref 0.1–1.0)
Monocytes Relative: 7 %
Neutro Abs: 3.7 10*3/uL (ref 1.7–7.7)
Neutrophils Relative %: 70 %
Platelet Count: 113 10*3/uL — ABNORMAL LOW (ref 150–400)
RBC: 3.04 MIL/uL — ABNORMAL LOW (ref 4.22–5.81)
RDW: 16.2 % — ABNORMAL HIGH (ref 11.5–15.5)
Smear Review: NORMAL
WBC Count: 5.3 10*3/uL (ref 4.0–10.5)
nRBC: 2.6 % — ABNORMAL HIGH (ref 0.0–0.2)

## 2022-12-17 MED ORDER — OXYCODONE HCL 15 MG PO TABS: 15.0000 mg | ORAL_TABLET | ORAL | 0 refills | Status: AC | PRN

## 2022-12-17 MED ORDER — KETOROLAC TROMETHAMINE 30 MG/ML IJ SOLN
30.0000 mg | Freq: Once | INTRAMUSCULAR | Status: AC
  Administered 2022-12-17: 30 mg via INTRAMUSCULAR
  Filled 2022-12-17: qty 1

## 2022-12-17 MED ORDER — HEPARIN SOD (PORK) LOCK FLUSH 100 UNIT/ML IV SOLN
500.0000 [IU] | Freq: Once | INTRAVENOUS | Status: AC
  Administered 2022-12-17: 500 [IU]

## 2022-12-17 MED ORDER — DIAZEPAM 2 MG PO TABS: 2.0000 mg | ORAL_TABLET | Freq: Three times a day (TID) | ORAL | 0 refills | Status: AC | PRN

## 2022-12-17 MED ORDER — SODIUM CHLORIDE 0.9% FLUSH
10.0000 mL | Freq: Once | INTRAVENOUS | Status: AC
  Administered 2022-12-17: 10 mL

## 2022-12-17 MED ORDER — SORBITOL 70 % PO SOLN: 30.0000 mL | Freq: Two times a day (BID) | ORAL | 1 refills | Status: DC | PRN

## 2022-12-17 MED ORDER — SODIUM CHLORIDE 0.9 % IV SOLN: Freq: Once | INTRAVENOUS | Status: AC

## 2022-12-17 NOTE — Telephone Encounter (Signed)
Barnett Applebaum called to say that Barry Gibson is constipated (last BM in ED on Saturday) depressed and taking in limited amounts of gatorade. Is asking if he can be seen today while he is here for RT at 2:30.  Message to [place] will come to [facility] after RT for IVF

## 2022-12-17 NOTE — Progress Notes (Signed)
[place]  [phone] [phone]   Name: Barry Gibson Date: 12/17/2022 MRN: [id number]  DOB: October 28, 1961  Patient Care Team: Coral Spikes, DO as PCP - General (Family Medicine) Truitt Merle, MD as Attending Physician (Hematology and Oncology)    INTERVAL HISTORY: Barry Gibson is a 62 y.o. male with oncologic medical history including bladder cancer diagnosed in January 2022.  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 Palliative ask to see for symptom management and goals of care.   SOCIAL HISTORY:     reports that he has quit smoking. His smoking use included cigarettes. He has a 10.00 pack-year smoking history. He has never used smokeless tobacco. He reports current alcohol use. He reports that he does not use drugs.  ADVANCE DIRECTIVES:   CODE STATUS: Full code  PAST MEDICAL HISTORY: Past Medical History:  Diagnosis Date   Cancer ([place])    bladder   Cataract    COVID-19 11/13/2019   h/a, chills, fatigue, loss of taste and smell, all symptoms resolved in a few weeks   Frequent urination    Fuchs' corneal dystrophy    Full dentures    GERD (gastroesophageal reflux disease)    Hypertension    Rheumatoid arthritis ([place])     ALLERGIES:  is allergic to ace inhibitors, diflucan [fluconazole], and morphine.  MEDICATIONS:  Current Outpatient Medications  Medication Sig Dispense Refill   lidocaine (XYLOCAINE) 5 % ointment Apply 1 Application topically 3 (three) times daily as needed. Use no more than 6 inches of ointment per application 53.97 g 0   acetaminophen (TYLENOL) 650 MG CR tablet Take 1,300 mg by mouth every morning.     dexamethasone (DECADRON) 4 MG tablet Take 1 tablet (4 mg total) by mouth 2 (two) times daily. Take for 3 days after each chemotherapy treatment 24 tablet 3   diclofenac Sodium (VOLTAREN) 1 % GEL Apply 4 g topically 4 (four) times  daily. Apply to knees 100 g 1   hydroxychloroquine (PLAQUENIL) 200 MG tablet Take 200 mg by mouth 2 (two) times daily.     Multiple Vitamin (MULTIVITAMIN WITH MINERALS) TABS tablet Take 1 tablet by mouth daily.     omeprazole (PRILOSEC) 40 MG capsule Take 40 mg by mouth daily.     ondansetron (ZOFRAN-ODT) 4 MG disintegrating tablet Take 1 tablet (4 mg total) by mouth every 8 (eight) hours as needed for nausea or vomiting. 20 tablet 1   oxyCODONE 10 MG TABS Take 1 tablet (10 mg total) by mouth every 6 (six) hours as needed for breakthrough pain. 20 tablet 0   prednisoLONE acetate (PRED FORTE) 1 % ophthalmic suspension Place 1 drop into the left eye 2 (two) times a week. Instill 1 drop into left eye Monday and Friday.     predniSONE (DELTASONE) 10 MG tablet Take 1 tablet (10 mg total) by mouth daily. Take 4 pills daily for 3 days, 3 pills daily for 3 days, 2 pills daily for 3 days, 1 pill daily for 3 days then stop 30 tablet 0   predniSONE (DELTASONE) 5 MG tablet Take 4 tablets (20 mg total) by mouth daily as needed (flare up). Can resume after finishing the tapering dose     promethazine (PHENERGAN) 25 MG tablet Take 1 tablet (25 mg total) by mouth every 6 (six) hours as needed for nausea or vomiting. 30 tablet 0   rosuvastatin (CRESTOR) 20 MG tablet  Take 1 tablet (20 mg total) by mouth daily. 90 tablet 1   sertraline (ZOLOFT) 100 MG tablet Take 1 tablet (100 mg total) by mouth daily. 90 tablet 3   No current facility-administered medications for this visit.   Facility-Administered Medications Ordered in Other Visits  Medication Dose Route Frequency Provider Last Rate Last Admin   acetaminophen (TYLENOL) 325 MG tablet            diphenhydrAMINE (BENADRYL) 25 mg capsule            gemcitabine (GEMZAR) chemo syringe for bladder instillation 2,000 mg  2,000 mg Bladder Instillation Once Franchot Gallo, MD       gemcitabine [facility]) chemo syringe for bladder instillation 2,000 mg  2,000 mg Bladder  Instillation Once Franchot Gallo, MD        VITAL SIGNS: There were no vitals taken for this visit. There were no vitals filed for this visit.  Estimated body mass index is 26.53 kg/m as calculated from the following:   Height as of 12/14/22: '5\' 11"'$  (1.803 m).   Weight as of an earlier encounter on 12/17/22: 190 lb 3.2 oz (86.3 kg).   PERFORMANCE STATUS (ECOG) : 1 - Symptomatic but completely ambulatory   Physical Exam General: NAD, weak appearing  Cardiovascular: regular rate and rhythm Pulmonary: normal breathing pattern  Abdomen: soft, nontender, urostomy bag in place Extremities: no edema, no joint deformities Skin: no rashes Neurological: AAO x3, depressed   IMPRESSION: I saw Barry Gibson during his infusion in symptom management. Wife is present. Continues to have some fatigue. Not able to be as active as he would like due to pain, constipation, and fatigue. Barry Gibson was recently hospitalized with dehydration and hypotension secondary to nausea, vomiting, and diarrhea. He shares that he was unable to get any rest last night and was up off and on. States it was a "horrible" night.   Neoplasm related pain Barry Gibson continues to have ongoing back and knee pains. He did receive cortisone injections in his knees with little to no relief. His back pain causes the most discomfort interfering with his ADLs and ability to rest.   Unfortunately he was unable to tolerate MS Contin. This caused severe nausea and vomiting. Since discontinuing he has been taking Oxycodone IR 10 mg every 6 hours. Reports mild relief however pain remains intense with medication.   Education provided on use of oxycodone. His insurance initially denied oxycodone extended release with request to initiate MS Contin first.  We discussed considering oxycodone extended release now but he has failed MS Contin to allow him additional options for better pain relief.  Education provided on increasing oxycodone IR to 15  mg allowing patient to take every 4 hours as needed for severe pain.  He and wife verbalized understanding and appreciation.  We will plan to continue to closely monitor and adjust as needed.  Constipation Patient has not had a significant bowel movement since 1/27.  Is concerned about his constipation.  Education provided on the use of stool softeners daily in the setting of opioid use.  Today he took a total of senna S x4 tabs without any results.  As instructed by Anda Kraft, [place] I reemphasized patient taking magnesium citrate once he gets home.  Given he is taking 4 Senokot today advised to start with a half a bottle and if no relief within 2-3 hours proceed with drinking the remainder of the bottle.  Education provided on continuing with daily senna 2 tabs  twice daily.  MiraLAX as needed.  I will also send in sorbitol to have on hand as needed for constipation.  Patient understands if he goes more than 24-48 hours without a bowel movement he would need to take the MiraLAX and or sorbitol.  Anxiety/Depression Barry Gibson appropriately is having increased depression and anxiety due to his diagnosis and symptom burden.  He confirms he has a virtual visit with his PCP on Thursday to further discuss his medications.  He is currently taking Zoloft 100 mg daily.  He is aware this may need to be adjusted.  He does not endorse any suicidal ideations however does express feelings of hopelessness secondary to his poor quality of life.  He is clear he wishes to continue to treat the treatable but also does not wish to be in a suffering state.  Barry Gibson describes episodes of chest tightness and head pressure in the setting of anxiety.  I discussed use of Valium as needed for anxiety.  Education provided on the efficacy of medication as well as potential side effects.  Patient and wife verbalized understanding.  I discussed the importance of continued conversation with family and their medical providers regarding overall  plan of care and treatment options, ensuring decisions are within the context of the patients values and GOCs.  PLAN:  Oxycodone 15 mg every 4 hours as needed for breakthrough pain. Unfortunately patient did not tolerate MS Contin.  Call severe nausea and vomiting.  This has since been discontinued.  We discussed possible use of Xtampza with hopes of better tolerance given patient is currently tolerating oxycodone IR.  Will work with insurance to get approval as they initially denied requesting initial trial of MS Contin. Senna S 2 tabs twice daily Patient has been advised to take magnesium citrate today.  Half a bottle initially and if no bowel movement within 2-3 hours instructed to take the remainder.   Sorbitol as needed  Miralax daily as needed for bowel regimen Education provided on nutrition. Recommended protein drinks 1-2 times daily.  Valium 2 mg every 8 hours as needed for anxiety.  Extensive education provided on efficacy and potential side effects. I will plan to see patient back in 2-3 weeks in collaboration to other oncology appointments. Will contact via phone on Monday for close follow-up.   Patient expressed understanding and was in agreement with this plan. He also understands that He can call the clinic at any time with any questions, concerns, or complaints.   Any controlled substances utilized were prescribed in the context of palliative care. PDMP has been reviewed.   Time Total: 45 min  Visit consisted of counseling and education dealing with the complex and emotionally intense issues of symptom management and palliative care in the setting of serious and potentially life-threatening illness.Greater than 50%  of this time was spent counseling and coordinating care related to the above assessment and plan.  Alda Lea, AGPCNP-BC  Palliative Medicine Team/[place] [facility]

## 2022-12-17 NOTE — Patient Instructions (Addendum)
If blood pressure is greater than 140/90 for 3 consecutive days then you should restart blood pressure medications as previously prescribed. Continue to check blood pressure once daily and do not take it if blood pressure is less than 110/70.   Continue to take the stool softener pills as directed on the bottle. Tonight try drinking 1/2 bottle of over the counter magnesium citrate. If no relief after 3 hours you should drink the other 1/2.  If no relief by tomorrow then start taking the Sorbitol as directed on the bottle.  Lexine Baton is prescribing Valium to help with anxiety and sleep and she is increasing your pain medicine oxycodone as you discussed.

## 2022-12-17 NOTE — Progress Notes (Signed)
Symptom Management Consult note [facility]    Patient Care Team: Coral Spikes, DO as PCP - General (Family Medicine) Truitt Merle, MD as Attending Physician (Hematology and Oncology)    Name of the patient: Barry Gibson  [id number]  [birth year]   Date of visit: 12/17/2022   Chief Complaint/Reason for visit: constipation, lab recheck   Current Therapy: Padcev  Last treatment:  Day 15   Cycle 3 on 12/13/22   ASSESSMENT & PLAN: Patient is a 62 y.o. male  with oncologic history of metastatic bladder carcinoma followed by Dr. Burr Medico.  I have viewed most recent oncology note and lab work.    #Metastatic bladder carcinoma -Patient previously followed by Dr. Alen Blew who recently left the practice. Patient care is being transferred to Dr Burr Medico. - Next appointment with oncologist is 12/27/22 -Labs collected today for follow up of recent hospital admission. CBC shows stable anemia, platelets improving 113 from 71 x 2 days ago. CMP similar to x 2 days prior no acute intervention needed. AKI resolved.  #Cancer related pain -Patient given IM toradol for pain in clinic as he has had relief with it in the past. -I discussed pain management and constipation with palliative care team as current regimen is not providing relief. Palliative care team was available to see patient and is prescribing valium for anxiety, Sorbitol, and increasing oxycodone frequency to q4 hours. Greatly appreciate their assistance with co management. Please see separate palliative note for full details.  #Constipation -Patient with non tender abdomen. Passing flatus. -Discussed aggressive OTC constipation management including magnesium citrate. -Sorbitol prescribed as per above if no relief with magnesium citrate.   -Patient given 1L NS for hydration support.   #Depression -Referral sent to chaplain by RN for worsening depression and interest in additional support outside of his family. - He also has  video visit with PCP this week to discuss medication management. Currently taking zoloft.  -Patient without suicidal ideations, more frustrated with worsening symptoms recently. Has strong support system at home.   Strict ED precautions discussed should symptoms worsen.    Heme/Onc History: Oncology History Overview Note   Cancer Staging  Bladder cancer [facility]) Staging form: Urinary Bladder, AJCC 8th Edition - Clinical: Stage IVB (cT4, cN2, cM1b) - Signed by Wyatt Portela, MD on 05/18/2021 WHO/ISUP grade (low/high): High Grade Histologic grading system: 2 grade system     Bladder cancer ([place])  03/06/2020 Initial Diagnosis   Bladder cancer ([place])   11/29/2020 Pathology Results     FINAL MICROSCOPIC DIAGNOSIS:  A. URETER, LEFT DISTAL MARGIN, BIOPSY: Benign ureter with chronic inflammation  B. URETER, RIGHT DISTAL MARGIN, BIOPSY: Benign ureter with chronic inflammation and BCG granulomas  C. LYMPH NODE, LEFT COMMON ILIAC, EXCISION: One benign lymph node (0/1)  D. LYMPH NODE, SENTINEL, LEFT EXTERNAL ILIAC, EXCISION: One benign lymph node (0/1)  E. LYMPH NODES, SENTINEL, LEFT INTERNAL ILIAC AND OBTURATOR, EXCISION: Metastatic urothelial carcinoma in one of two lymph nodes (1/2, 1 mm)  F. LYMPH NODE, SENTINEL, RIGHT PELVIC, EXCISION: Metastatic urothelial carcinoma in three of three lymph nodes (3/3, 3.0 cm with extra nodal extension, pN2)  G. URETER, FINAL LEFT DISTAL MARGIN, BIOPSY: Benign ureter with chronic inflammation  H. 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 URETER, FINAL RIGHT DISTAL MARGIN, BIOPSY: Benign ureter     05/18/2021 Cancer  Staging   Staging form: Urinary Bladder, AJCC 8th Edition - Clinical: Stage IVB (cT4, cN2, cM1b) - Signed by Wyatt Portela, MD on 05/18/2021 WHO/ISUP grade (low/high): High Grade Histologic grading system: 2 grade system   06/08/2021 - 08/17/2021 Chemotherapy   Patient is on Treatment Plan : BLADDER Cisplatin D1 + Gemcitabine D1,8 q21d x 6 Cycles     09/07/2021 - 06/07/2022 Chemotherapy   Patient is on Treatment Plan : HEAD/NECK Pembrolizumab      09/07/2021 - 07/19/2022 Chemotherapy   Patient is on Treatment Plan : ANUS Pembrolizumab (400) q42d     09/27/2022 -  Chemotherapy   Patient is on Treatment Plan : UROTHELIAL LOCALLY ADVANCED, METASTATIC Enfortumab D1,8,15 q28d     11/21/2022 Imaging    IMPRESSION: 1. Relatively diffuse osseous metastasis, sclerotic less than lytic, newly apparent since 09/16/2022. This could represent development of osseous metastasis or healing of previously CT occult metastasis. 2. Otherwise, response to therapy of pelvic nodal and peritoneal metastasis. 3. No extra osseous metastasis within the chest. 4. Incidental findings, including: Aortic atherosclerosis (ICD10-I70.0), coronary artery atherosclerosis and emphysema (ICD10-J43.9). Cholelithiasis. Fat and nonobstructive small bowel containing ventral abdominal wall hernias.     12/10/2022 Imaging    IMPRESSION: 1. Diffuse osseous metastatic disease to the spine, ribs, sacrum and visualized pelvic bones. No associated fracture or retropulsion. 2. Degenerative changes of the lumbar spine with mild spinal canal stenosis at L3-4 and L4-5 and moderate narrowing of the bilateral subarticular zones and moderate bilateral neural foraminal narrowing at L3-4. 3. Moderate narrowing of the left subarticular zone and moderate left neural foraminal narrowing at L5-S1. 4. Cholelithiasis. 5. Stable 4 cm infrarenal aortic aneurysm.     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 male with oncologic history as above presenting to [facility] today with chief complaint of lab recheck and constipation. He is accompanied by spouse who provides additional information.  Patient had recent hospital admission for hypotension.  He was discharged x 2 days ago.  Patient states since being discharged he is experiencing constipation.  He admits to passing flatus today and felt his stomach rumbling. He feels bloated although denies abdominal pain. Admits to intermittent nausea.  He has been taking over-the-counter senna S daily and zofran.  At time of discharge patient was prescribed oxycodone by the hospitalist.  He has been taking these every 6 hours without much symptom improvement.  He is endorsing aching pain in his low back and knees.  This pain has been ongoing for several months.  He denies injury.  He rates his pain currently 8 out of 10 in severity.  Pain is worse with movement. He takes Tylenol arthritis daily.  Patient also mentions worsening depression with his recent worsening symptoms.  He is currently taking Zoloft as prescribed by his PCP.  He has been on this for many years.  He has an appointment with PCP for video visit this week to discuss possible dose adjustment.  Patient denies any suicidal or homicidal ideations.  He has a strong support system at home including spouse and family.      ROS  All other systems are reviewed and are negative for acute change except as  noted in the HPI.    Allergies  Allergen Reactions   Ace Inhibitors Cough   Diflucan [Fluconazole] Other (See Comments)    Irritated ulcers   Morphine Nausea And Vomiting     Past Medical History:  Diagnosis Date   Cancer ([place])    bladder   Cataract    COVID-19 11/13/2019   h/a, chills, fatigue, loss of taste and smell, all symptoms resolved in a few weeks   Frequent urination    Fuchs'  corneal dystrophy    Full dentures    GERD (gastroesophageal reflux disease)    Hypertension    Rheumatoid arthritis ([place])      Past Surgical History:  Procedure Laterality Date   CORNEAL TRANSPLANT Left 2013   CYSTOSCOPY WITH BIOPSY N/A 09/14/2019   Procedure: CYSTOSCOPY WITH BIOPSY;  Surgeon: Franchot Gallo, MD;  Location: [facility];  Service: Urology;  Laterality: N/A;   CYSTOSCOPY WITH INJECTION N/A 11/29/2020   Procedure: CYSTOSCOPY WITH INJECTION OF INDOCYANINE GREEN DYE;  Surgeon: Alexis Frock, MD;  Location: [facility];  Service: Urology;  Laterality: N/A;  6 HRS   FULGURATION OF BLADDER TUMOR N/A 09/14/2019   Procedure: FULGURATION OF BLADDER TUMOR;  Surgeon: Franchot Gallo, MD;  Location: [facility];  Service: Urology;  Laterality: N/A;   IR IMAGING GUIDED PORT INSERTION  06/07/2021   IR NEPHROSTOMY EXCHANGE LEFT  10/30/2020   IR NEPHROSTOMY EXCHANGE LEFT  11/19/2020   IR NEPHROSTOMY EXCHANGE RIGHT  10/30/2020   IR NEPHROSTOMY EXCHANGE RIGHT  11/19/2020   IR NEPHROSTOMY PLACEMENT LEFT  09/27/2020   IR NEPHROSTOMY PLACEMENT RIGHT  09/27/2020   ROBOT ASSISTED LAPAROSCOPIC COMPLETE CYSTECT ILEAL CONDUIT N/A 11/29/2020   Procedure: XI ROBOTIC ASSISTED LAPAROSCOPIC COMPLETE CYSTECT ILEAL CONDUIT/RADICAL PROSTATECTOMY/LYMPHADENECTOMY;  Surgeon: Alexis Frock, MD;  Location: [facility];  Service: Urology;  Laterality: N/A;   TRANSURETHRAL RESECTION OF BLADDER TUMOR WITH MITOMYCIN-C N/A 08/05/2019   Procedure: TRANSURETHRAL RESECTION OF BLADDER TUMOR;  Surgeon: Franchot Gallo, MD;  Location[facility];  Service: Urology;  Laterality: N/A;  1 HR   TRANSURETHRAL RESECTION OF BLADDER TUMOR WITH MITOMYCIN-C N/A 04/24/2020   Procedure: TRANSURETHRAL RESECTION OF BLADDER TUMOR WITH GEMCIDABINE;  Surgeon: Franchot Gallo, MD;  Location: [facility];  Service: Urology;  Laterality: N/A;    Social History   Socioeconomic History   Marital status: Married     Spouse name: Not on file   Number of children: Not on file   Years of education: Not on file   Highest education level: Not on file  Occupational History   Not on file  Tobacco Use   Smoking status: Former    Packs/day: 0.50    Years: 20.00    Total pack years: 10.00    Types: Cigarettes   Smokeless tobacco: Never   Tobacco comments:    quit 2010  Vaping Use   Vaping Use: Never used  Substance and Sexual Activity   Alcohol use: Yes    Comment: occ  2 x month   Drug use: No   Sexual activity: Yes  Other Topics Concern   Not on file  Social History Narrative   Not on file   Social Determinants of Health   Financial Resource Strain: Not on file  Food Insecurity: No Food Insecurity (12/14/2022)   Hunger Vital Sign    Worried About Running Out of Food in the Last Year: Never true    Leslie in the Last Year:  Never true  Transportation Needs: No Transportation Needs (12/14/2022)   PRAPARE - Hydrologist (Medical): No    Lack of Transportation (Non-Medical): No  Physical Activity: Not on file  Stress: Not on file  Social Connections: Not on file  Intimate Partner Violence: Not At Risk (12/14/2022)   Humiliation, Afraid, Rape, and Kick questionnaire    Fear of Current or Ex-Partner: No    Emotionally Abused: No    Physically Abused: No    Sexually Abused: No    No family history on file.   Current Outpatient Medications:    lidocaine (XYLOCAINE) 5 % ointment, Apply 1 Application topically 3 (three) times daily as needed. Use no more than 6 inches of ointment per application, Disp: 36.64 g, Rfl: 0   acetaminophen (TYLENOL) 650 MG CR tablet, Take 1,300 mg by mouth every morning., Disp: , Rfl:    dexamethasone (DECADRON) 4 MG tablet, Take 1 tablet (4 mg total) by mouth 2 (two) times daily. Take for 3 days after each chemotherapy treatment, Disp: 24 tablet, Rfl: 3   diazepam (VALIUM) 2 MG tablet, Take 1 tablet (2 mg total) by mouth every 8  (eight) hours as needed for anxiety., Disp: 45 tablet, Rfl: 0   diclofenac Sodium (VOLTAREN) 1 % GEL, Apply 4 g topically 4 (four) times daily. Apply to knees, Disp: 100 g, Rfl: 1   hydroxychloroquine (PLAQUENIL) 200 MG tablet, Take 200 mg by mouth 2 (two) times daily., Disp: , Rfl:    Multiple Vitamin (MULTIVITAMIN WITH MINERALS) TABS tablet, Take 1 tablet by mouth daily., Disp: , Rfl:    omeprazole (PRILOSEC) 40 MG capsule, Take 40 mg by mouth daily., Disp: , Rfl:    ondansetron (ZOFRAN-ODT) 4 MG disintegrating tablet, Take 1 tablet (4 mg total) by mouth every 8 (eight) hours as needed for nausea or vomiting., Disp: 20 tablet, Rfl: 1   [START ON 12/19/2022] oxyCODONE (ROXICODONE) 15 MG immediate release tablet, Take 1 tablet (15 mg total) by mouth every 4 (four) hours as needed for severe pain., Disp: 90 tablet, Rfl: 0   oxyCODONE 10 MG TABS, Take 1 tablet (10 mg total) by mouth every 6 (six) hours as needed for breakthrough pain., Disp: 20 tablet, Rfl: 0   prednisoLONE acetate (PRED FORTE) 1 % ophthalmic suspension, Place 1 drop into the left eye 2 (two) times a week. Instill 1 drop into left eye Monday and Friday., Disp: , Rfl:    predniSONE (DELTASONE) 10 MG tablet, Take 1 tablet (10 mg total) by mouth daily. Take 4 pills daily for 3 days, 3 pills daily for 3 days, 2 pills daily for 3 days, 1 pill daily for 3 days then stop, Disp: 30 tablet, Rfl: 0   predniSONE (DELTASONE) 5 MG tablet, Take 4 tablets (20 mg total) by mouth daily as needed (flare up). Can resume after finishing the tapering dose, Disp: , Rfl:    promethazine (PHENERGAN) 25 MG tablet, Take 1 tablet (25 mg total) by mouth every 6 (six) hours as needed for nausea or vomiting., Disp: 30 tablet, Rfl: 0   rosuvastatin (CRESTOR) 20 MG tablet, Take 1 tablet (20 mg total) by mouth daily., Disp: 90 tablet, Rfl: 1   sertraline (ZOLOFT) 100 MG tablet, Take 1 tablet (100 mg total) by mouth daily., Disp: 90 tablet, Rfl: 3   sorbitol 70 % solution,  Take 30 mLs by mouth 2 (two) times daily as needed (for constipation)., Disp: 473 mL, Rfl: 1  No current facility-administered medications for this visit.  Facility-Administered Medications Ordered in Other Visits:    acetaminophen (TYLENOL) 325 MG tablet, , , ,    diphenhydrAMINE (BENADRYL) 25 mg capsule, , , ,    gemcitabine (GEMZAR) chemo syringe for bladder instillation 2,000 mg, 2,000 mg, Bladder Instillation, Once, Franchot Gallo, MD   gemcitabine [facility]) chemo syringe for bladder instillation 2,000 mg, 2,000 mg, Bladder Instillation, Once, Franchot Gallo, MD  PHYSICAL EXAM: ECOG FS:1 - Symptomatic but completely ambulatory    Vitals:   12/17/22 1541  BP: 124/83  Pulse: 80  Resp: 16  Temp: 98.8 F (37.1 C)  TempSrc: Oral  SpO2: 99%  Weight: 190 lb 3.2 oz (86.3 kg)   Physical Exam Vitals and nursing note reviewed.  Constitutional:      Appearance: He is well-developed. He is not ill-appearing or toxic-appearing.  HENT:     Head: Normocephalic.     Nose: Nose normal.     Mouth/Throat:     Mouth: Mucous membranes are moist.     Pharynx: Oropharynx is clear.  Eyes:     Conjunctiva/sclera: Conjunctivae normal.  Neck:     Vascular: No JVD.  Cardiovascular:     Rate and Rhythm: Normal rate and regular rhythm.     Pulses: Normal pulses.     Heart sounds: Normal heart sounds.  Pulmonary:     Effort: Pulmonary effort is normal.     Breath sounds: Normal breath sounds.  Abdominal:     General: Bowel sounds are normal. There is no distension.     Palpations: Abdomen is soft. There is no mass.     Tenderness: There is no abdominal tenderness. There is no guarding or rebound.     Comments: Ileal conduit  Musculoskeletal:     Cervical back: Normal range of motion.     Right lower leg: No edema.     Left lower leg: No edema.  Skin:    General: Skin is warm and dry.  Neurological:     Mental Status: He is oriented to person, place, and time.     Comments:  Ambulatory with normal gait  Psychiatric:        Mood and Affect: Mood is anxious.        Thought Content: Thought content does not include homicidal or suicidal ideation.        LABORATORY DATA: I have reviewed the data as listed    Latest Ref Rng & Units 12/17/2022    3:35 PM 12/15/2022    5:04 AM 12/13/2022    2:55 PM  CBC  WBC 4.0 - 10.5 K/uL 5.3  4.9  6.0   Hemoglobin 13.0 - 17.0 g/dL 9.0  8.3  10.7   Hematocrit 39.0 - 52.0 % 26.3  25.3  33.5   Platelets 150 - 400 K/uL 113  71  82         Latest Ref Rng & Units 12/17/2022    3:35 PM 12/15/2022    5:04 AM 12/14/2022    5:37 AM  CMP  Glucose 70 - 99 mg/dL 109  124  74   BUN 6 - 20 mg/dL 28  32  36   Creatinine 0.61 - 1.24 mg/dL 1.14  1.17  1.53   Sodium 135 - 145 mmol/L 136  134  133   Potassium 3.5 - 5.1 mmol/L 4.1  4.3  3.8   Chloride 98 - 111 mmol/L 107  108  109  CO2 22 - 32 mmol/L '20  17  16   '$ Calcium 8.9 - 10.3 mg/dL 8.6  8.4  7.8   Total Protein 6.5 - 8.1 g/dL 5.9     Total Bilirubin 0.3 - 1.2 mg/dL 0.4     Alkaline Phos 38 - 126 U/L 503     AST 15 - 41 U/L 29     ALT 0 - 44 U/L 9          RADIOGRAPHIC STUDIES (from last 24 hours if applicable) I have personally reviewed the radiological images as listed and agreed with the findings in the report. No results found.      Visit Diagnosis: 1. Port-A-Cath in place   2. Neoplasm related pain   3. 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 Palliative care patient   5. Constipation, unspecified constipation type      No orders of the defined types were placed in this encounter.   All questions were answered. The patient knows to call the clinic with any problems, questions or concerns. No barriers to learning was detected.  I have spent a total of 30 minutes minutes of face-to-face and non-face-to-face time, preparing to see the patient, obtaining and/or reviewing separately obtained history, performing a medically appropriate examination,  counseling and educating the patient, ordering tests, documenting clinical information in the electronic health record, and care coordination (communications with other health care professionals or caregivers).    Thank you for allowing me to participate in the care of this patient.    Barrie Folk, PA-C Department of Hematology/Oncology [facility] Phone: [phone]  [phone]    12/17/2022 5:49 PM

## 2022-12-17 NOTE — Patient Instructions (Signed)

## 2022-12-18 ENCOUNTER — Ambulatory Visit: Payer: BC Managed Care – PPO

## 2022-12-18 ENCOUNTER — Telehealth: Payer: Self-pay

## 2022-12-18 ENCOUNTER — Encounter: Payer: Self-pay | Admitting: General Practice

## 2022-12-18 LAB — CULTURE, BLOOD (ROUTINE X 2)
Culture: NO GROWTH
Culture: NO GROWTH
Special Requests: ADEQUATE
Special Requests: ADEQUATE

## 2022-12-18 NOTE — Progress Notes (Signed)
[facility] Spiritual Care Note  Referred by nursing for patient and caregiver support. Reached Mr Milliron' wife Barnett Applebaum by phone per their request, scheduling in-person visit in [place] office tomorrow (12/19/2022) at 2:15, prior to his radiation treatment.   [place], [place], [facility] Pager [phone] Voicemail [phone]

## 2022-12-18 NOTE — Telephone Encounter (Signed)
Notified Patient of prior authorization approval for Oxycodone HCL '15mg'$  Tablets. Medication is approved through 12/19/2023. No other needs or concerns voiced at this time.

## 2022-12-19 ENCOUNTER — Other Ambulatory Visit: Payer: Self-pay

## 2022-12-19 ENCOUNTER — Telehealth: Payer: Self-pay

## 2022-12-19 ENCOUNTER — Ambulatory Visit
Admission: RE | Admit: 2022-12-19 | Discharge: 2022-12-19 | Disposition: A | Discharge status: Home / Self Care | Payer: BC Managed Care – PPO | Source: Ambulatory Visit | Attending: Radiation Oncology | Admitting: Radiation Oncology

## 2022-12-19 ENCOUNTER — Telehealth (INDEPENDENT_AMBULATORY_CARE_PROVIDER_SITE_OTHER): Payer: BC Managed Care – PPO | Admitting: Family Medicine

## 2022-12-19 ENCOUNTER — Encounter: Payer: Self-pay | Admitting: General Practice

## 2022-12-19 DIAGNOSIS — Z87891 Personal history of nicotine dependence: Secondary | ICD-10-CM | POA: Diagnosis not present

## 2022-12-19 DIAGNOSIS — I959 Hypotension, unspecified: Secondary | ICD-10-CM | POA: Insufficient documentation

## 2022-12-19 DIAGNOSIS — Z79899 Other long term (current) drug therapy: Secondary | ICD-10-CM | POA: Insufficient documentation

## 2022-12-19 DIAGNOSIS — E860 Dehydration: Secondary | ICD-10-CM | POA: Diagnosis not present

## 2022-12-19 DIAGNOSIS — C7951 Secondary malignant neoplasm of bone: Secondary | ICD-10-CM | POA: Diagnosis not present

## 2022-12-19 DIAGNOSIS — G893 Neoplasm related pain (acute) (chronic): Secondary | ICD-10-CM | POA: Diagnosis not present

## 2022-12-19 DIAGNOSIS — C778 Secondary and unspecified malignant neoplasm of lymph nodes of multiple regions: Secondary | ICD-10-CM | POA: Diagnosis not present

## 2022-12-19 DIAGNOSIS — N179 Acute kidney failure, unspecified: Secondary | ICD-10-CM | POA: Insufficient documentation

## 2022-12-19 DIAGNOSIS — J439 Emphysema, unspecified: Secondary | ICD-10-CM | POA: Diagnosis not present

## 2022-12-19 DIAGNOSIS — C786 Secondary malignant neoplasm of retroperitoneum and peritoneum: Secondary | ICD-10-CM | POA: Diagnosis not present

## 2022-12-19 DIAGNOSIS — Z515 Encounter for palliative care: Secondary | ICD-10-CM | POA: Insufficient documentation

## 2022-12-19 DIAGNOSIS — Z5112 Encounter for antineoplastic immunotherapy: Secondary | ICD-10-CM | POA: Insufficient documentation

## 2022-12-19 DIAGNOSIS — R630 Anorexia: Secondary | ICD-10-CM | POA: Insufficient documentation

## 2022-12-19 DIAGNOSIS — Z510 Encounter for antineoplastic radiation therapy: Secondary | ICD-10-CM | POA: Diagnosis not present

## 2022-12-19 DIAGNOSIS — F419 Anxiety disorder, unspecified: Secondary | ICD-10-CM | POA: Insufficient documentation

## 2022-12-19 DIAGNOSIS — Z7952 Long term (current) use of systemic steroids: Secondary | ICD-10-CM | POA: Insufficient documentation

## 2022-12-19 DIAGNOSIS — I700 Atherosclerosis of aorta: Secondary | ICD-10-CM | POA: Diagnosis not present

## 2022-12-19 DIAGNOSIS — I2510 Atherosclerotic heart disease of native coronary artery without angina pectoris: Secondary | ICD-10-CM | POA: Diagnosis not present

## 2022-12-19 DIAGNOSIS — K5900 Constipation, unspecified: Secondary | ICD-10-CM | POA: Insufficient documentation

## 2022-12-19 DIAGNOSIS — C670 Malignant neoplasm of trigone of bladder: Secondary | ICD-10-CM | POA: Insufficient documentation

## 2022-12-19 DIAGNOSIS — R627 Adult failure to thrive: Secondary | ICD-10-CM | POA: Diagnosis not present

## 2022-12-19 DIAGNOSIS — Z87440 Personal history of urinary (tract) infections: Secondary | ICD-10-CM | POA: Insufficient documentation

## 2022-12-19 DIAGNOSIS — M545 Low back pain, unspecified: Secondary | ICD-10-CM | POA: Diagnosis not present

## 2022-12-19 DIAGNOSIS — I10 Essential (primary) hypertension: Secondary | ICD-10-CM | POA: Diagnosis not present

## 2022-12-19 DIAGNOSIS — K8020 Calculus of gallbladder without cholecystitis without obstruction: Secondary | ICD-10-CM | POA: Diagnosis not present

## 2022-12-19 DIAGNOSIS — R634 Abnormal weight loss: Secondary | ICD-10-CM | POA: Diagnosis not present

## 2022-12-19 DIAGNOSIS — Z883 Allergy status to other anti-infective agents status: Secondary | ICD-10-CM | POA: Insufficient documentation

## 2022-12-19 DIAGNOSIS — F32A Depression, unspecified: Secondary | ICD-10-CM | POA: Insufficient documentation

## 2022-12-19 LAB — RAD ONC ARIA SESSION SUMMARY
Course Elapsed Days: 14
Plan Fractions Treated to Date: 9
Plan Prescribed Dose Per Fraction: 3 Gy
Plan Total Fractions Prescribed: 10
Plan Total Prescribed Dose: 30 Gy
Reference Point Dosage Given to Date: 27 Gy
Reference Point Session Dosage Given: 3 Gy
Session Number: 9

## 2022-12-19 MED ORDER — ARIPIPRAZOLE 5 MG PO TABS: 5.0000 mg | ORAL_TABLET | Freq: Every day | ORAL | 1 refills | Status: AC

## 2022-12-19 NOTE — Telephone Encounter (Signed)
Per patient's wife Barnett Applebaum:  They followed the advice of Alda Lea, NP and took mag citrate Tuesday night, patient then had 2 episodes of diarrhea yesterday morning. Patient took 2 senna pills Wednesday morning then 2 more pills Wednesday evening. Patient took 1/2 bottle of mag citrate this AM and then had one episode of diarrhea. Barnett Applebaum also reports that their pharmacy did not have the prescribed sorbitol in stock when they went to pick it up. Patient is complaining of mild abdominal discomfort- "dull stomach pain". Patient is wondering what to do from here.   This RN spoke with Penni Homans- Cousar, NP and relayed this advice to [place]:  Patient can take 2 senna in the morning, and 2 senna in the evening along with Miralax once daily as instructed. Nikki, NP, will resend sorbitol to patient's pharmacy in [place], but patient does not need to take this as he has had a bowel movement with the mag citrate. Sorbitol is only needed if patient does not respond to mag citrate, and he can have the sorbitol on hand for future constipation issues. Per Lexine Baton, patient can also have prune juice with 1 teaspoon of melted butter to help further with constipation. Should the patient have 4 or more bowel movements daily, he is to reduce the senna to once a day.

## 2022-12-19 NOTE — Progress Notes (Signed)
Virtual Visit via Telephone Note  I connected with Barry Gibson on 12/19/22 at  4:10 PM EST by telephone and verified that I am speaking with the correct person using two identifiers.  Location: Patient: Medical office; radiation oncology Provider: Office   I discussed the limitations, risks, security and privacy concerns of performing an evaluation and management service by telephone and the availability of in person appointments. I also discussed with the patient that there may be a patient responsible charge related to this service. The patient expressed understanding and agreed to proceed.   History of Present Illness: 62 year old male with metastatic bladder cancer presents to discuss depression.  Patient states that he is struggling at this time.  He states that he is going through a lot with his cancer treatment and feels like giving up.  I advised him that if he feels that he no longer wants to proceed with treatment that he may do so.  He feels down and depressed.  He is currently on Zoloft.  He would like to discuss medications to help his depression.  He is followed closely by palliative.    Observations/Objective: Speaking in full sentences.  No apparent respiratory distress.  Assessment and Plan:  62 year old male with worsening mood in the setting of ongoing cancer treatment.  Continue Zoloft.  Adding Abilify.  Follow Up Instructions:    I discussed the assessment and treatment plan with the patient. The patient was provided an opportunity to ask questions and all were answered. The patient agreed with the plan and demonstrated an understanding of the instructions.   The patient was advised to call back or seek an in-person evaluation if the symptoms worsen or if the condition fails to improve as anticipated.  I provided 5 minutes of non-face-to-face time during this encounter.   Coral Spikes, DO

## 2022-12-19 NOTE — Progress Notes (Signed)
[facility] Spiritual Care Note  Met with Barry Gibson and his wife Barry Gibson in [place] office as planned. They used the opportunity to share and process challenges of being the patient and the caregiver, both with [name] treatment and in adjacent relationships (such as [name]'s concurrent caregiver role with her dad).  We talked about common stressors and consequent relationship dynamics. They noted that they felt better after getting to verbalize their experiences.  A future topic to explore is the theme of grief (at loss of the old normal and the family roles and joys prior to becoming patient and caregiver). We closed with prayer per their request.  Couple knows to reach out whenever needed/desired for follow-up conversation and support.    [place], [place], [facility] Pager [phone] Voicemail [phone]

## 2022-12-20 ENCOUNTER — Inpatient Hospital Stay: Payer: BC Managed Care – PPO | Attending: Oncology

## 2022-12-20 ENCOUNTER — Other Ambulatory Visit: Payer: Self-pay | Admitting: Hematology

## 2022-12-20 ENCOUNTER — Ambulatory Visit
Admission: RE | Admit: 2022-12-20 | Discharge: 2022-12-20 | Disposition: A | Discharge status: Home / Self Care | Payer: BC Managed Care – PPO | Source: Ambulatory Visit | Attending: Radiation Oncology | Admitting: Radiation Oncology

## 2022-12-20 ENCOUNTER — Encounter: Payer: Self-pay | Admitting: Urology

## 2022-12-20 ENCOUNTER — Ambulatory Visit: Payer: BC Managed Care – PPO

## 2022-12-20 ENCOUNTER — Other Ambulatory Visit: Payer: Self-pay

## 2022-12-20 VITALS — BP 139/95 | HR 81 | Temp 97.9°F | Resp 16

## 2022-12-20 DIAGNOSIS — G893 Neoplasm related pain (acute) (chronic): Secondary | ICD-10-CM | POA: Insufficient documentation

## 2022-12-20 DIAGNOSIS — Z9079 Acquired absence of other genital organ(s): Secondary | ICD-10-CM | POA: Insufficient documentation

## 2022-12-20 DIAGNOSIS — N390 Urinary tract infection, site not specified: Secondary | ICD-10-CM | POA: Insufficient documentation

## 2022-12-20 DIAGNOSIS — C679 Malignant neoplasm of bladder, unspecified: Secondary | ICD-10-CM | POA: Insufficient documentation

## 2022-12-20 DIAGNOSIS — I700 Atherosclerosis of aorta: Secondary | ICD-10-CM | POA: Insufficient documentation

## 2022-12-20 DIAGNOSIS — D649 Anemia, unspecified: Secondary | ICD-10-CM | POA: Insufficient documentation

## 2022-12-20 DIAGNOSIS — M48061 Spinal stenosis, lumbar region without neurogenic claudication: Secondary | ICD-10-CM | POA: Insufficient documentation

## 2022-12-20 DIAGNOSIS — J439 Emphysema, unspecified: Secondary | ICD-10-CM | POA: Insufficient documentation

## 2022-12-20 DIAGNOSIS — Z515 Encounter for palliative care: Secondary | ICD-10-CM | POA: Insufficient documentation

## 2022-12-20 DIAGNOSIS — Z510 Encounter for antineoplastic radiation therapy: Secondary | ICD-10-CM | POA: Diagnosis not present

## 2022-12-20 DIAGNOSIS — Z885 Allergy status to narcotic agent status: Secondary | ICD-10-CM | POA: Insufficient documentation

## 2022-12-20 DIAGNOSIS — Z95828 Presence of other vascular implants and grafts: Secondary | ICD-10-CM

## 2022-12-20 DIAGNOSIS — Z87891 Personal history of nicotine dependence: Secondary | ICD-10-CM | POA: Insufficient documentation

## 2022-12-20 DIAGNOSIS — C786 Secondary malignant neoplasm of retroperitoneum and peritoneum: Secondary | ICD-10-CM | POA: Insufficient documentation

## 2022-12-20 DIAGNOSIS — Z8616 Personal history of COVID-19: Secondary | ICD-10-CM | POA: Insufficient documentation

## 2022-12-20 DIAGNOSIS — I2510 Atherosclerotic heart disease of native coronary artery without angina pectoris: Secondary | ICD-10-CM | POA: Insufficient documentation

## 2022-12-20 DIAGNOSIS — Z883 Allergy status to other anti-infective agents status: Secondary | ICD-10-CM | POA: Insufficient documentation

## 2022-12-20 DIAGNOSIS — K8020 Calculus of gallbladder without cholecystitis without obstruction: Secondary | ICD-10-CM | POA: Insufficient documentation

## 2022-12-20 DIAGNOSIS — M47816 Spondylosis without myelopathy or radiculopathy, lumbar region: Secondary | ICD-10-CM | POA: Insufficient documentation

## 2022-12-20 DIAGNOSIS — I7143 Infrarenal abdominal aortic aneurysm, without rupture: Secondary | ICD-10-CM | POA: Insufficient documentation

## 2022-12-20 DIAGNOSIS — I10 Essential (primary) hypertension: Secondary | ICD-10-CM | POA: Insufficient documentation

## 2022-12-20 DIAGNOSIS — C7951 Secondary malignant neoplasm of bone: Secondary | ICD-10-CM | POA: Insufficient documentation

## 2022-12-20 DIAGNOSIS — Z79899 Other long term (current) drug therapy: Secondary | ICD-10-CM | POA: Insufficient documentation

## 2022-12-20 DIAGNOSIS — R590 Localized enlarged lymph nodes: Secondary | ICD-10-CM | POA: Insufficient documentation

## 2022-12-20 LAB — RAD ONC ARIA SESSION SUMMARY
Course Elapsed Days: 15
Plan Fractions Treated to Date: 10
Plan Prescribed Dose Per Fraction: 3 Gy
Plan Total Fractions Prescribed: 10
Plan Total Prescribed Dose: 30 Gy
Reference Point Dosage Given to Date: 30 Gy
Reference Point Session Dosage Given: 3 Gy
Session Number: 10

## 2022-12-20 MED ORDER — HEPARIN SOD (PORK) LOCK FLUSH 100 UNIT/ML IV SOLN
500.0000 [IU] | Freq: Once | INTRAVENOUS | Status: AC | PRN
  Administered 2022-12-20: 500 [IU]

## 2022-12-20 MED ORDER — SODIUM CHLORIDE 0.9% FLUSH
10.0000 mL | Freq: Once | INTRAVENOUS | Status: AC | PRN
  Administered 2022-12-20: 10 mL

## 2022-12-20 MED ORDER — SODIUM CHLORIDE 0.9 % IV SOLN: Freq: Once | INTRAVENOUS | Status: AC

## 2022-12-20 NOTE — Patient Instructions (Signed)

## 2022-12-23 ENCOUNTER — Encounter: Payer: Self-pay | Admitting: Nurse Practitioner

## 2022-12-23 ENCOUNTER — Other Ambulatory Visit: Payer: Self-pay

## 2022-12-23 ENCOUNTER — Inpatient Hospital Stay (HOSPITAL_BASED_OUTPATIENT_CLINIC_OR_DEPARTMENT_OTHER): Payer: BC Managed Care – PPO | Admitting: Nurse Practitioner

## 2022-12-23 DIAGNOSIS — R630 Anorexia: Secondary | ICD-10-CM | POA: Diagnosis not present

## 2022-12-23 DIAGNOSIS — G893 Neoplasm related pain (acute) (chronic): Secondary | ICD-10-CM | POA: Diagnosis not present

## 2022-12-23 DIAGNOSIS — C7951 Secondary malignant neoplasm of bone: Secondary | ICD-10-CM

## 2022-12-23 DIAGNOSIS — C679 Malignant neoplasm of bladder, unspecified: Secondary | ICD-10-CM

## 2022-12-23 DIAGNOSIS — K5903 Drug induced constipation: Secondary | ICD-10-CM | POA: Diagnosis not present

## 2022-12-23 DIAGNOSIS — Z515 Encounter for palliative care: Secondary | ICD-10-CM | POA: Diagnosis not present

## 2022-12-23 DIAGNOSIS — R530 Neoplastic (malignant) related fatigue: Secondary | ICD-10-CM

## 2022-12-23 MED ORDER — ONDANSETRON 4 MG PO TBDP: 4.0000 mg | ORAL_TABLET | Freq: Three times a day (TID) | ORAL | 1 refills | Status: AC | PRN

## 2022-12-23 MED ORDER — PROMETHAZINE HCL 25 MG PO TABS: 25.0000 mg | ORAL_TABLET | Freq: Four times a day (QID) | ORAL | 0 refills | Status: AC | PRN

## 2022-12-23 NOTE — Progress Notes (Unsigned)
Barry  [phone]) [phone] Fax:(336) [phone]   Name: Barry Gibson Date: 12/23/2022 MRN: [id number]  DOB: 01/19/1961  Patient Care Team: Coral Spikes, DO as PCP - General (Family Medicine) Truitt Merle, MD as Attending Physician (Hematology and Oncology)    I connected with [name] on 12/23/22 at  1:30 PM EST by phone and verified that I am speaking with the correct person using two identifiers.   I discussed the limitations, risks, security and privacy concerns of performing an evaluation and management service by telemedicine and the availability of in-person appointments. I also discussed with the patient that there may be a patient responsible charge related to this service. The patient expressed understanding and agreed to proceed.   Other persons participating in the visit and their role in the encounter: maygan, RN   Patient's location: home   Provider's location: [facility]   Chief Complaint: follow up on symptom management     INTERVAL HISTORY: Barry Gibson is a 62 y.o. male with oncologic medical history including bladder cancer diagnosed in January 2022.  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 Palliative ask to see for symptom management and goals of care.   SOCIAL HISTORY:     reports that he has quit smoking. His smoking use included cigarettes. He has a 10.00 pack-year smoking history. He has never used smokeless tobacco. He reports current alcohol use. He reports that he does not use drugs.  ADVANCE DIRECTIVES:   CODE STATUS: Full code  PAST MEDICAL HISTORY: Past Medical History:  Diagnosis Date   Cancer ([place])    bladder   Cataract    COVID-19 11/13/2019   h/a, chills, fatigue, loss of taste and smell, all symptoms resolved in a few weeks   Frequent urination    Fuchs' corneal dystrophy    Full dentures    GERD (gastroesophageal reflux disease)     Hypertension    Rheumatoid arthritis ([place])     ALLERGIES:  is allergic to ace inhibitors, diflucan [fluconazole], and morphine.  MEDICATIONS:  Current Outpatient Medications  Medication Sig Dispense Refill   acetaminophen (TYLENOL) 650 MG CR tablet Take 1,300 mg by mouth every morning.     ARIPiprazole (ABILIFY) 5 MG tablet Take 1 tablet (5 mg total) by mouth daily. 90 tablet 1   dexamethasone (DECADRON) 4 MG tablet Take 1 tablet (4 mg total) by mouth 2 (two) times daily. Take for 3 days after each chemotherapy treatment 24 tablet 3   diazepam (VALIUM) 2 MG tablet Take 1 tablet (2 mg total) by mouth every 8 (eight) hours as needed for anxiety. 45 tablet 0   diclofenac Sodium (VOLTAREN) 1 % GEL Apply 4 g topically 4 (four) times daily. Apply to knees 100 g 1   hydroxychloroquine (PLAQUENIL) 200 MG tablet Take 200 mg by mouth 2 (two) times daily.     lidocaine (XYLOCAINE) 5 % ointment Apply 1 Application topically 3 (three) times daily as needed. Use no more than 6 inches of ointment per application 85.88 g 0   Multiple Vitamin (MULTIVITAMIN WITH MINERALS) TABS tablet Take 1 tablet by mouth daily.     omeprazole (PRILOSEC) 40 MG capsule Take 40 mg by mouth daily.     ondansetron (ZOFRAN-ODT) 4 MG disintegrating tablet Take 1 tablet (4 mg total) by mouth every 8 (eight) hours as needed for nausea or vomiting. 20 tablet 1   oxyCODONE (ROXICODONE) 15  MG immediate release tablet Take 1 tablet (15 mg total) by mouth every 4 (four) hours as needed for severe pain. 90 tablet 0   oxyCODONE 10 MG TABS Take 1 tablet (10 mg total) by mouth every 6 (six) hours as needed for breakthrough pain. 20 tablet 0   prednisoLONE acetate (PRED FORTE) 1 % ophthalmic suspension Place 1 drop into the left eye 2 (two) times a week. Instill 1 drop into left eye Monday and Friday.     predniSONE (DELTASONE) 10 MG tablet Take 1 tablet (10 mg total) by mouth daily. Take 4 pills daily for 3 days, 3 pills daily for 3 days, 2  pills daily for 3 days, 1 pill daily for 3 days then stop 30 tablet 0   predniSONE (DELTASONE) 5 MG tablet Take 4 tablets (20 mg total) by mouth daily as needed (flare up). Can resume after finishing the tapering dose     promethazine (PHENERGAN) 25 MG tablet Take 1 tablet (25 mg total) by mouth every 6 (six) hours as needed for nausea or vomiting. 30 tablet 0   rosuvastatin (CRESTOR) 20 MG tablet Take 1 tablet (20 mg total) by mouth daily. 90 tablet 1   sertraline (ZOLOFT) 100 MG tablet Take 1 tablet (100 mg total) by mouth daily. 90 tablet 3   sorbitol 70 % solution Take 30 mLs by mouth 2 (two) times daily as needed (for constipation). 473 mL 1   No current facility-administered medications for this visit.   Facility-Administered Medications Ordered in Other Visits  Medication Dose Route Frequency Provider Last Rate Last Admin   acetaminophen (TYLENOL) 325 MG tablet            diphenhydrAMINE (BENADRYL) 25 mg capsule            gemcitabine (GEMZAR) chemo syringe for bladder instillation 2,000 mg  2,000 mg Bladder Instillation Once Franchot Gallo, MD       gemcitabine [facility]) chemo syringe for bladder instillation 2,000 mg  2,000 mg Bladder Instillation Once Franchot Gallo, MD        VITAL SIGNS: There were no vitals taken for this visit. There were no vitals filed for this visit.  Estimated body mass index is 26.53 kg/m as calculated from the following:   Height as of 12/14/22: '5\' 11"'$  (1.803 m).   Weight as of 12/17/22: 190 lb 3.2 oz (86.3 kg).   PERFORMANCE STATUS (ECOG) : 1 - Symptomatic but completely ambulatory   Physical Exam General: NAD, weak appearing  Cardiovascular: regular rate and rhythm Pulmonary: normal breathing pattern  Abdomen: soft, nontender, urostomy bag in place Extremities: no edema, no joint deformities Skin: no rashes Neurological: AAO x3, depressed   IMPRESSION:    Neoplasm related pain [name] continues to have ongoing back and knee  pains. He did receive cortisone injections in his knees with little to no relief. His back pain causes the most discomfort interfering with his ADLs and ability to rest.   Unfortunately he was unable to tolerate MS Contin. This caused severe nausea and vomiting. Since discontinuing he has been taking Oxycodone IR 10 mg every 6 hours. Reports mild relief however pain remains intense with medication.   Education provided on use of oxycodone. His insurance initially denied oxycodone extended release with request to initiate MS Contin first.  We discussed considering oxycodone extended release now but he has failed MS Contin to allow him additional options for better pain relief.  Education provided on increasing oxycodone IR to  15 mg allowing patient to take every 4 hours as needed for severe pain.  He and wife verbalized understanding and appreciation.  We will plan to continue to closely monitor and adjust as needed.  Constipation   Anxiety/Depression   I discussed the importance of continued conversation with family and their medical providers regarding overall plan of care and treatment options, ensuring decisions are within the context of the patients values and GOCs.  PLAN:  Oxycodone 15 mg every 4 hours as needed for breakthrough pain. Unfortunately patient did not tolerate MS Contin.  Call severe nausea and vomiting.  This has since been discontinued.  We discussed possible use of Xtampza with hopes of better tolerance given patient is currently tolerating oxycodone IR.  Will work with insurance to get approval as they initially denied requesting initial trial of MS Contin. Senna S 2 tabs twice daily Patient has been advised to take magnesium citrate today.  Half a bottle initially and if no bowel movement within 2-3 hours instructed to take the remainder.   Sorbitol as needed  Miralax daily as needed for bowel regimen Education provided on nutrition. Recommended protein drinks 1-2 times  daily.  Valium 2 mg every 8 hours as needed for anxiety.  Extensive education provided on efficacy and potential side effects. I will plan to see patient back in 2-3 weeks in collaboration to other oncology appointments. Will contact via phone on Monday for close follow-up.   Patient expressed understanding and was in agreement with this plan. He also understands that He can call the clinic at any time with any questions, concerns, or complaints.   Any controlled substances utilized were prescribed in the context of palliative care. PDMP has been reviewed.   Time Total: 45 min  Visit consisted of counseling and education dealing with the complex and emotionally intense issues of symptom management and palliative care in the setting of serious and potentially life-threatening illness.Greater than 50%  of this time was spent counseling and coordinating care related to the above assessment and plan.  Alda Lea, AGPCNP-BC  Palliative Medicine Team/[place]

## 2022-12-24 ENCOUNTER — Encounter: Payer: Self-pay | Admitting: Hematology

## 2022-12-24 ENCOUNTER — Other Ambulatory Visit: Payer: Self-pay | Admitting: Nurse Practitioner

## 2022-12-24 ENCOUNTER — Other Ambulatory Visit: Payer: Self-pay

## 2022-12-24 ENCOUNTER — Telehealth: Payer: Self-pay

## 2022-12-24 MED ORDER — GABAPENTIN 300 MG PO CAPS: 300.0000 mg | ORAL_CAPSULE | Freq: Every day | ORAL | 0 refills | Status: AC

## 2022-12-24 NOTE — Telephone Encounter (Signed)
Pt wife called wishing to review a previously discussed medication- gabapentin. Education provided on what medication is, how to take it, and what negative side effects to watch out for. Pt wife Barnett Applebaum verbalized understanding and new order sen to pharmacy.

## 2022-12-25 ENCOUNTER — Telehealth: Payer: Self-pay

## 2022-12-25 ENCOUNTER — Other Ambulatory Visit: Payer: Self-pay | Admitting: Hematology

## 2022-12-25 MED ORDER — LACTULOSE 20 GM/30ML PO SOLN: 20.0000 g | Freq: Two times a day (BID) | ORAL | 2 refills | Status: AC | PRN

## 2022-12-25 NOTE — Telephone Encounter (Signed)
Pt wife called reporting pt is constipated again, lactulose sent to pharmacy, pt wife educated on bowel regimen,no further questions at this time.

## 2022-12-26 ENCOUNTER — Telehealth: Payer: Self-pay | Admitting: *Deleted

## 2022-12-26 NOTE — Telephone Encounter (Signed)
Barnett Applebaum states [name] FMLA is ending on 2/24. The employer is asking for a doctor's note asking to extend his leave based on his condition and continuing treatments. They asked that the doctor put June 30th, 2024. This entitles him to keep his health insurance.

## 2022-12-26 NOTE — Assessment & Plan Note (Addendum)
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  -diagnosed in 11/2020 -He underwent robotic assisted laparoscopic radical cystectomy and lymphadenectomy January 2022.  -CT 04/2021 showed worsening abdominal and pelvic node metastasis  -first line chemo with 4 cycles cisplatin and gemcitabine 06/08/2021-08/17/2021 -pembro 09/07/21-07/19/2022  -currently on Padcev since 09/27/2022  -recent CT 11/21/2022 showed improved node and peritoneal mets, however spinal MRI showed new diffuse bone mets, indicting cancer progression  -He recently completed a course of palliative RT to pelvic nodes for his significant scrotal and leg edema

## 2022-12-27 ENCOUNTER — Inpatient Hospital Stay: Payer: BC Managed Care – PPO

## 2022-12-27 ENCOUNTER — Other Ambulatory Visit: Payer: Self-pay

## 2022-12-27 ENCOUNTER — Telehealth: Payer: Self-pay

## 2022-12-27 ENCOUNTER — Inpatient Hospital Stay: Payer: BC Managed Care – PPO | Admitting: Hematology

## 2022-12-27 ENCOUNTER — Encounter: Payer: Self-pay | Admitting: Hematology

## 2022-12-27 ENCOUNTER — Inpatient Hospital Stay (HOSPITAL_BASED_OUTPATIENT_CLINIC_OR_DEPARTMENT_OTHER): Payer: BC Managed Care – PPO | Admitting: Hematology

## 2022-12-27 VITALS — BP 100/58 | HR 90 | Temp 97.5°F | Resp 16 | Wt 186.0 lb

## 2022-12-27 DIAGNOSIS — Z515 Encounter for palliative care: Secondary | ICD-10-CM | POA: Insufficient documentation

## 2022-12-27 DIAGNOSIS — C670 Malignant neoplasm of trigone of bladder: Secondary | ICD-10-CM | POA: Diagnosis present

## 2022-12-27 DIAGNOSIS — I2510 Atherosclerotic heart disease of native coronary artery without angina pectoris: Secondary | ICD-10-CM | POA: Diagnosis not present

## 2022-12-27 DIAGNOSIS — K8020 Calculus of gallbladder without cholecystitis without obstruction: Secondary | ICD-10-CM | POA: Diagnosis not present

## 2022-12-27 DIAGNOSIS — Z9079 Acquired absence of other genital organ(s): Secondary | ICD-10-CM | POA: Insufficient documentation

## 2022-12-27 DIAGNOSIS — Z87891 Personal history of nicotine dependence: Secondary | ICD-10-CM | POA: Diagnosis not present

## 2022-12-27 DIAGNOSIS — Z8616 Personal history of COVID-19: Secondary | ICD-10-CM | POA: Diagnosis not present

## 2022-12-27 DIAGNOSIS — N390 Urinary tract infection, site not specified: Secondary | ICD-10-CM | POA: Diagnosis not present

## 2022-12-27 DIAGNOSIS — D649 Anemia, unspecified: Secondary | ICD-10-CM | POA: Insufficient documentation

## 2022-12-27 DIAGNOSIS — C7951 Secondary malignant neoplasm of bone: Secondary | ICD-10-CM | POA: Diagnosis not present

## 2022-12-27 DIAGNOSIS — Z79899 Other long term (current) drug therapy: Secondary | ICD-10-CM | POA: Diagnosis not present

## 2022-12-27 DIAGNOSIS — I700 Atherosclerosis of aorta: Secondary | ICD-10-CM | POA: Insufficient documentation

## 2022-12-27 DIAGNOSIS — G893 Neoplasm related pain (acute) (chronic): Secondary | ICD-10-CM | POA: Insufficient documentation

## 2022-12-27 DIAGNOSIS — I10 Essential (primary) hypertension: Secondary | ICD-10-CM | POA: Diagnosis not present

## 2022-12-27 DIAGNOSIS — R590 Localized enlarged lymph nodes: Secondary | ICD-10-CM | POA: Insufficient documentation

## 2022-12-27 DIAGNOSIS — Z885 Allergy status to narcotic agent status: Secondary | ICD-10-CM | POA: Diagnosis not present

## 2022-12-27 DIAGNOSIS — J439 Emphysema, unspecified: Secondary | ICD-10-CM | POA: Diagnosis not present

## 2022-12-27 DIAGNOSIS — I7143 Infrarenal abdominal aortic aneurysm, without rupture: Secondary | ICD-10-CM | POA: Insufficient documentation

## 2022-12-27 DIAGNOSIS — Z883 Allergy status to other anti-infective agents status: Secondary | ICD-10-CM | POA: Diagnosis not present

## 2022-12-27 DIAGNOSIS — Z5112 Encounter for antineoplastic immunotherapy: Secondary | ICD-10-CM | POA: Diagnosis present

## 2022-12-27 DIAGNOSIS — Z95828 Presence of other vascular implants and grafts: Secondary | ICD-10-CM

## 2022-12-27 DIAGNOSIS — M47816 Spondylosis without myelopathy or radiculopathy, lumbar region: Secondary | ICD-10-CM | POA: Insufficient documentation

## 2022-12-27 DIAGNOSIS — C679 Malignant neoplasm of bladder, unspecified: Secondary | ICD-10-CM | POA: Diagnosis not present

## 2022-12-27 DIAGNOSIS — C786 Secondary malignant neoplasm of retroperitoneum and peritoneum: Secondary | ICD-10-CM | POA: Diagnosis not present

## 2022-12-27 DIAGNOSIS — M48061 Spinal stenosis, lumbar region without neurogenic claudication: Secondary | ICD-10-CM | POA: Insufficient documentation

## 2022-12-27 LAB — CMP (CANCER CENTER ONLY)
ALT: 8 U/L (ref 0–44)
AST: 30 U/L (ref 15–41)
Albumin: 3.2 g/dL — ABNORMAL LOW (ref 3.5–5.0)
Alkaline Phosphatase: 385 U/L — ABNORMAL HIGH (ref 38–126)
Anion gap: 7 (ref 5–15)
BUN: 26 mg/dL — ABNORMAL HIGH (ref 6–20)
CO2: 24 mmol/L (ref 22–32)
Calcium: 8.3 mg/dL — ABNORMAL LOW (ref 8.9–10.3)
Chloride: 107 mmol/L (ref 98–111)
Creatinine: 1.41 mg/dL — ABNORMAL HIGH (ref 0.61–1.24)
GFR, Estimated: 57 mL/min — ABNORMAL LOW (ref >60)
Glucose, Bld: 102 mg/dL — ABNORMAL HIGH (ref 70–99)
Potassium: 3.7 mmol/L (ref 3.5–5.1)
Sodium: 138 mmol/L (ref 135–145)
Total Bilirubin: 0.4 mg/dL (ref 0.3–1.2)
Total Protein: 5.6 g/dL — ABNORMAL LOW (ref 6.5–8.1)

## 2022-12-27 LAB — CBC WITH DIFFERENTIAL (CANCER CENTER ONLY)
Abs Immature Granulocytes: 0.4 10*3/uL — ABNORMAL HIGH (ref 0.00–0.07)
Basophils Absolute: 0.1 10*3/uL (ref 0.0–0.1)
Basophils Relative: 1 %
Eosinophils Absolute: 0.1 10*3/uL (ref 0.0–0.5)
Eosinophils Relative: 2 %
HCT: 25.3 % — ABNORMAL LOW (ref 39.0–52.0)
Hemoglobin: 8.1 g/dL — ABNORMAL LOW (ref 13.0–17.0)
Immature Granulocytes: 9 %
Lymphocytes Relative: 16 %
Lymphs Abs: 0.7 10*3/uL (ref 0.7–4.0)
MCH: 29.2 pg (ref 26.0–34.0)
MCHC: 32 g/dL (ref 30.0–36.0)
MCV: 91.3 fL (ref 80.0–100.0)
Monocytes Absolute: 0.5 10*3/uL (ref 0.1–1.0)
Monocytes Relative: 11 %
Neutro Abs: 2.9 10*3/uL (ref 1.7–7.7)
Neutrophils Relative %: 61 %
Platelet Count: 72 10*3/uL — ABNORMAL LOW (ref 150–400)
RBC: 2.77 MIL/uL — ABNORMAL LOW (ref 4.22–5.81)
RDW: 17.5 % — ABNORMAL HIGH (ref 11.5–15.5)
Smear Review: NORMAL
WBC Count: 4.7 10*3/uL (ref 4.0–10.5)
nRBC: 9.7 % — ABNORMAL HIGH (ref 0.0–0.2)

## 2022-12-27 MED ORDER — SODIUM CHLORIDE 0.9% FLUSH
10.0000 mL | Freq: Once | INTRAVENOUS | Status: AC | PRN
  Administered 2022-12-27: 10 mL

## 2022-12-27 MED ORDER — HEPARIN SOD (PORK) LOCK FLUSH 100 UNIT/ML IV SOLN
500.0000 [IU] | Freq: Once | INTRAVENOUS | Status: AC
  Administered 2022-12-27: 500 [IU] via INTRAVENOUS

## 2022-12-27 MED ORDER — SODIUM CHLORIDE 0.9% FLUSH
10.0000 mL | Freq: Once | INTRAVENOUS | Status: AC
  Administered 2022-12-27: 10 mL via INTRAVENOUS

## 2022-12-27 NOTE — Progress Notes (Signed)
[place]   Telephone:(336) [phone] Fax:(336) [phone]   Clinic Follow up Note   Patient Care Team: Coral Spikes, DO as PCP - General (Family Medicine) Truitt Merle, MD as Attending Physician (Hematology and Oncology)  Date of Service:  12/27/2022  CHIEF COMPLAINT: f/u of Bladder Cancer  CURRENT THERAPY:  Enfortumab q28d started  09/27/2022  ASSESSMENT:  Barry Gibson is a 62 y.o. 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  -diagnosed in 11/2020 -He underwent robotic assisted laparoscopic radical cystectomy and lymphadenectomy January 2022.  -CT 04/2021 showed worsening abdominal and pelvic node metastasis  -first line chemo with 4 cycles cisplatin and gemcitabine 06/08/2021-08/17/2021 -pembro 09/07/21-07/19/2022  -currently on Padcev since 09/27/2022  -recent CT 11/21/2022 showed improved node and peritoneal mets, however spinal MRI showed new diffuse bone mets, indicting cancer progression  -He recently completed a course of palliative RT to lumbar spinal bone mets  for his significant back pain, and his pain has much improved  -I reviewed his recent CT and MRI scan images and discussed the findings with patient, due to cancer progression, I will stop Padcev, we obtained NGS Tempus today to see if his tumor has any targetable mutations -I will call him in 2 weeks to review the Tempus results, and decide next line treatment.    PLAN: - lab reviewed hg 8.1 -Discuss Molecule Testing Tempus which was done today  -Cancel Padcev treatment due to cancer progression  - phone visit in 2 weeks.  SUMMARY OF ONCOLOGIC HISTORY: Oncology History Overview Note   Cancer Staging  Bladder cancer [facility]) Staging form: Urinary Bladder, AJCC 8th Edition - Clinical: Stage IVB (cT4, cN2, cM1b) - Signed by Wyatt Portela, MD on 05/18/2021 WHO/ISUP grade (low/high): High Grade Histologic grading system: 2 grade  system     Bladder cancer ([place])  03/06/2020 Initial Diagnosis   Bladder cancer ([place])   11/29/2020 Pathology Results     FINAL MICROSCOPIC DIAGNOSIS:  A. URETER, LEFT DISTAL MARGIN, BIOPSY: Benign ureter with chronic inflammation  B. URETER, RIGHT DISTAL MARGIN, BIOPSY: Benign ureter with chronic inflammation and BCG granulomas  C. LYMPH NODE, LEFT COMMON ILIAC, EXCISION: One benign lymph node (0/1)  D. LYMPH NODE, SENTINEL, LEFT EXTERNAL ILIAC, EXCISION: One benign lymph node (0/1)  E. LYMPH NODES, SENTINEL, LEFT INTERNAL ILIAC AND OBTURATOR, EXCISION: Metastatic urothelial carcinoma in one of two lymph nodes (1/2, 1 mm)  F. LYMPH NODE, SENTINEL, RIGHT PELVIC, EXCISION: Metastatic urothelial carcinoma in three of three lymph nodes (3/3, 3.0 cm with extra nodal extension, pN2)  G. URETER, FINAL LEFT DISTAL MARGIN, BIOPSY: Benign ureter with chronic inflammation  H. 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 URETER, FINAL RIGHT DISTAL MARGIN, BIOPSY: Benign ureter     05/18/2021 Cancer Staging   Staging form: Urinary Bladder, AJCC 8th Edition - Clinical: Stage IVB (cT4, cN2, cM1b) - Signed by Wyatt Portela, MD on 05/18/2021 WHO/ISUP grade (low/high): High Grade Histologic grading system: 2 grade system   06/08/2021 - 08/17/2021 Chemotherapy   Patient is on Treatment Plan : BLADDER Cisplatin D1 + Gemcitabine D1,8 q21d x 6 Cycles     09/07/2021 - 06/07/2022 Chemotherapy  Patient is on Treatment Plan : HEAD/NECK Pembrolizumab      09/07/2021 - 07/19/2022 Chemotherapy   Patient is on Treatment Plan : ANUS Pembrolizumab (400) q42d     09/27/2022 -   Chemotherapy   Patient is on Treatment Plan : UROTHELIAL LOCALLY ADVANCED, METASTATIC Enfortumab D1,8,15 q28d     11/21/2022 Imaging    IMPRESSION: 1. Relatively diffuse osseous metastasis, sclerotic less than lytic, newly apparent since 09/16/2022. This could represent development of osseous metastasis or healing of previously CT occult metastasis. 2. Otherwise, response to therapy of pelvic nodal and peritoneal metastasis. 3. No extra osseous metastasis within the chest. 4. Incidental findings, including: Aortic atherosclerosis (ICD10-I70.0), coronary artery atherosclerosis and emphysema (ICD10-J43.9). Cholelithiasis. Fat and nonobstructive small bowel containing ventral abdominal wall hernias.     12/10/2022 Imaging    IMPRESSION: 1. Diffuse osseous metastatic disease to the spine, ribs, sacrum and visualized pelvic bones. No associated fracture or retropulsion. 2. Degenerative changes of the lumbar spine with mild spinal canal stenosis at L3-4 and L4-5 and moderate narrowing of the bilateral subarticular zones and moderate bilateral neural foraminal narrowing at L3-4. 3. Moderate narrowing of the left subarticular zone and moderate left neural foraminal narrowing at L5-S1. 4. Cholelithiasis. 5. Stable 4 cm infrarenal aortic aneurysm.     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 Pt states that the pain is a lot better since starting radiation.Pt reports of using Oxycodone for pain with Tylenol. Pt appetite is a little better.    All other systems were reviewed with the patient and are negative.  MEDICAL HISTORY:  Past Medical History:  Diagnosis Date   Cancer [facility])    bladder   Cataract    COVID-19 11/13/2019   h/a,  chills, fatigue, loss of taste and smell, all symptoms resolved in a few weeks   Frequent urination    Fuchs' corneal dystrophy    Full dentures    GERD (gastroesophageal reflux disease)    Hypertension    Rheumatoid arthritis ([place])     SURGICAL HISTORY: Past Surgical History:  Procedure Laterality Date   CORNEAL TRANSPLANT Left 2013   CYSTOSCOPY WITH BIOPSY N/A 09/14/2019   Procedure: CYSTOSCOPY WITH BIOPSY;  Surgeon: Franchot Gallo, MD;  Location: [facility];  Service: Urology;  Laterality: N/A;   CYSTOSCOPY WITH INJECTION N/A 11/29/2020   Procedure: CYSTOSCOPY WITH INJECTION OF INDOCYANINE GREEN DYE;  Surgeon: Alexis Frock, MD;  Location: [facility];  Service: Urology;  Laterality: N/A;  6 HRS   FULGURATION OF BLADDER TUMOR N/A 09/14/2019   Procedure: FULGURATION OF BLADDER TUMOR;  Surgeon: Franchot Gallo, MD;  Location: [facility];  Service: Urology;  Laterality: N/A;   IR IMAGING GUIDED PORT INSERTION  06/07/2021   IR NEPHROSTOMY EXCHANGE LEFT  10/30/2020   IR NEPHROSTOMY EXCHANGE LEFT  11/19/2020   IR NEPHROSTOMY EXCHANGE RIGHT  10/30/2020   IR NEPHROSTOMY EXCHANGE RIGHT  11/19/2020   IR NEPHROSTOMY PLACEMENT LEFT  09/27/2020   IR NEPHROSTOMY PLACEMENT RIGHT  09/27/2020   ROBOT ASSISTED LAPAROSCOPIC COMPLETE CYSTECT ILEAL CONDUIT N/A 11/29/2020   Procedure: XI ROBOTIC ASSISTED LAPAROSCOPIC COMPLETE CYSTECT ILEAL CONDUIT/RADICAL PROSTATECTOMY/LYMPHADENECTOMY;  Surgeon: Alexis Frock, MD;  Location: [facility];  Service: Urology;  Laterality: N/A;  TRANSURETHRAL RESECTION OF BLADDER TUMOR WITH MITOMYCIN-C N/A 08/05/2019   Procedure: TRANSURETHRAL RESECTION OF BLADDER TUMOR;  Surgeon: Franchot Gallo, MD;  Location: [facility];  Service: Urology;  Laterality: N/A;  1 HR   TRANSURETHRAL RESECTION OF BLADDER TUMOR WITH MITOMYCIN-C N/A 04/24/2020   Procedure: TRANSURETHRAL RESECTION OF BLADDER TUMOR WITH GEMCIDABINE;  Surgeon: Franchot Gallo, MD;  [facility];  Service: Urology;  Laterality: N/A;    I have reviewed the social history and family history with the patient and they are unchanged from previous note.  ALLERGIES:  is allergic to ace inhibitors, diflucan [fluconazole], and morphine.  MEDICATIONS:  Current Outpatient Medications  Medication Sig Dispense Refill   acetaminophen (TYLENOL) 650 MG CR tablet Take 1,300 mg by mouth every morning.     ARIPiprazole (ABILIFY) 5 MG tablet Take 1 tablet (5 mg total) by mouth daily. 90 tablet 1   dexamethasone (DECADRON) 4 MG tablet Take 1 tablet (4 mg total) by mouth 2 (two) times daily. Take for 3 days after each chemotherapy treatment 24 tablet 3   diazepam (VALIUM) 2 MG tablet Take 1 tablet (2 mg total) by mouth every 8 (eight) hours as needed for anxiety. 45 tablet 0   diclofenac Sodium (VOLTAREN) 1 % GEL Apply 4 g topically 4 (four) times daily. Apply to knees 100 g 1   gabapentin (NEURONTIN) 300 MG capsule Take 1 capsule (300 mg total) by mouth at bedtime. 15 capsule 0   hydroxychloroquine (PLAQUENIL) 200 MG tablet Take 200 mg by mouth 2 (two) times daily.     Lactulose 20 GM/30ML SOLN Take 30 mLs (20 g total) by mouth 2 (two) times daily as needed (take 2 times daily as needed for constipation). 237 mL 2   lidocaine (XYLOCAINE) 5 % ointment Apply 1 Application topically 3 (three) times daily as needed. Use no more than 6 inches of ointment per application [id number] g 0   Multiple Vitamin (MULTIVITAMIN WITH MINERALS) TABS tablet Take 1 tablet by mouth daily.     omeprazole (PRILOSEC) 40 MG capsule Take 40 mg by mouth daily.     ondansetron (ZOFRAN-ODT) 4 MG disintegrating tablet Take 1 tablet (4 mg total) by mouth every 8 (eight) hours as needed for nausea or vomiting. 20 tablet 1   oxyCODONE (ROXICODONE) 15 MG immediate release tablet Take 1 tablet (15 mg total) by mouth every 4 (four) hours as needed for severe pain. 90 tablet 0   oxyCODONE 10 MG TABS Take 1 tablet (10 mg total) by  mouth every 6 (six) hours as needed for breakthrough pain. 20 tablet 0   prednisoLONE acetate (PRED FORTE) 1 % ophthalmic suspension Place 1 drop into the left eye 2 (two) times a week. Instill 1 drop into left eye Monday and Friday.     predniSONE (DELTASONE) 10 MG tablet Take 1 tablet (10 mg total) by mouth daily. Take 4 pills daily for 3 days, 3 pills daily for 3 days, 2 pills daily for 3 days, 1 pill daily for 3 days then stop 30 tablet 0   predniSONE (DELTASONE) 5 MG tablet Take 4 tablets (20 mg total) by mouth daily as needed (flare up). Can resume after finishing the tapering dose     promethazine (PHENERGAN) 25 MG tablet Take 1 tablet (25 mg total) by mouth every 6 (six) hours as needed for nausea or vomiting. 30 tablet 0   rosuvastatin (CRESTOR) 20 MG tablet Take 1 tablet (20 mg total)  by mouth daily. 90 tablet 1   sertraline (ZOLOFT) 100 MG tablet Take 1 tablet (100 mg total) by mouth daily. 90 tablet 3   No current facility-administered medications for this visit.   Facility-Administered Medications Ordered in Other Visits  Medication Dose Route Frequency Provider Last Rate Last Admin   acetaminophen (TYLENOL) 325 MG tablet            diphenhydrAMINE (BENADRYL) 25 mg capsule            gemcitabine (GEMZAR) chemo syringe for bladder instillation 2,000 mg  2,000 mg Bladder Instillation Once Franchot Gallo, MD       gemcitabine [facility]) chemo syringe for bladder instillation 2,000 mg  2,000 mg Bladder Instillation Once Franchot Gallo, MD        PHYSICAL EXAMINATION: ECOG PERFORMANCE STATUS: 2 - Symptomatic, <50% confined to bed  Vitals:   12/27/22 1000 12/27/22 1003  BP: (!) 94/49 (!) 100/58  Pulse: 90   Resp: 16   Temp: (!) 97.5 F (36.4 C)   SpO2: 99%    Wt Readings from Last 3 Encounters:  12/27/22 186 lb (84.4 kg)  12/17/22 190 lb 3.2 oz (86.3 kg)  12/10/22 186 lb 8 oz (84.6 kg)     GENERAL:alert, no distress and comfortable SKIN: skin color normal, no rashes  or significant lesions EYES: normal, Conjunctiva are pink and non-injected, sclera clear  NEURO: alert & oriented x 3 with fluent speech   LABORATORY DATA:  I have reviewed the data as listed    Latest Ref Rng & Units 12/27/2022    9:29 AM 12/17/2022    3:35 PM 12/15/2022    5:04 AM  CBC  WBC 4.0 - 10.5 K/uL 4.7  5.3  4.9   Hemoglobin 13.0 - 17.0 g/dL 8.1  9.0  8.3   Hematocrit 39.0 - 52.0 % 25.3  26.3  25.3   Platelets 150 - 400 K/uL 72  113  71         Latest Ref Rng & Units 12/27/2022    9:29 AM 12/17/2022    3:35 PM 12/15/2022    5:04 AM  CMP  Glucose 70 - 99 mg/dL 102  109  124   BUN 6 - 20 mg/dL 26  28  32   Creatinine 0.61 - 1.24 mg/dL 1.41  1.14  1.17   Sodium 135 - 145 mmol/L 138  136  134   Potassium 3.5 - 5.1 mmol/L 3.7  4.1  4.3   Chloride 98 - 111 mmol/L 107  107  108   CO2 22 - 32 mmol/L 24  20  17   $ Calcium 8.9 - 10.3 mg/dL 8.3  8.6  8.4   Total Protein 6.5 - 8.1 g/dL 5.6  5.9    Total Bilirubin 0.3 - 1.2 mg/dL 0.4  0.4    Alkaline Phos 38 - 126 U/L 385  503    AST 15 - 41 U/L 30  29    ALT 0 - 44 U/L 8  9        RADIOGRAPHIC STUDIES: I have personally reviewed the radiological images as listed and agreed with the findings in the report. No results found.    Orders Placed This Encounter  Procedures   Miscellaneous Genetic Test    [facility]   Sample to Blood Bank    Standing Status:   Future    Standing Expiration Date:   12/28/2023   All questions were answered. The patient knows  to call the clinic with any problems, questions or concerns. No barriers to learning was detected. The total time spent in the appointment was 40 minutes.     Truitt Merle, MD 12/27/2022   Felicity Coyer, CMA, am acting as scribe for Truitt Merle, MD.   I have reviewed the above documentation for accuracy and completeness, and I agree with the above.

## 2022-12-27 NOTE — Telephone Encounter (Signed)
Pt wife called reporting pt had 3 loose stools today, educated [place] on when and how to take mirilax, senna, and lactulose and when to hold these medications. Educated [place] on pushing fluids for the pt as well so he does not become dehydrated. [place] verbalized understanding, no further questions at the time of the call

## 2022-12-29 LAB — PROSTATE-SPECIFIC AG, SERUM (LABCORP): Prostate Specific Ag, Serum: <0.1 ng/mL (ref 0.0–4.0)

## 2022-12-30 ENCOUNTER — Inpatient Hospital Stay (HOSPITAL_BASED_OUTPATIENT_CLINIC_OR_DEPARTMENT_OTHER): Payer: BC Managed Care – PPO | Admitting: Physician Assistant

## 2022-12-30 ENCOUNTER — Other Ambulatory Visit: Payer: Self-pay

## 2022-12-30 ENCOUNTER — Inpatient Hospital Stay: Payer: BC Managed Care – PPO

## 2022-12-30 ENCOUNTER — Other Ambulatory Visit: Payer: Self-pay | Admitting: Physician Assistant

## 2022-12-30 ENCOUNTER — Telehealth: Payer: Self-pay

## 2022-12-30 VITALS — BP 96/65 | HR 89 | Temp 97.5°F | Resp 18 | Ht 71.0 in

## 2022-12-30 VITALS — BP 97/62 | HR 85 | Temp 98.1°F | Resp 16

## 2022-12-30 DIAGNOSIS — D649 Anemia, unspecified: Secondary | ICD-10-CM | POA: Diagnosis not present

## 2022-12-30 DIAGNOSIS — G893 Neoplasm related pain (acute) (chronic): Secondary | ICD-10-CM

## 2022-12-30 DIAGNOSIS — D696 Thrombocytopenia, unspecified: Secondary | ICD-10-CM

## 2022-12-30 DIAGNOSIS — A4153 Sepsis due to Serratia: Secondary | ICD-10-CM | POA: Diagnosis not present

## 2022-12-30 DIAGNOSIS — C679 Malignant neoplasm of bladder, unspecified: Secondary | ICD-10-CM

## 2022-12-30 DIAGNOSIS — Z95828 Presence of other vascular implants and grafts: Secondary | ICD-10-CM

## 2022-12-30 DIAGNOSIS — C7951 Secondary malignant neoplasm of bone: Secondary | ICD-10-CM

## 2022-12-30 DIAGNOSIS — R410 Disorientation, unspecified: Secondary | ICD-10-CM | POA: Diagnosis not present

## 2022-12-30 DIAGNOSIS — N390 Urinary tract infection, site not specified: Secondary | ICD-10-CM

## 2022-12-30 LAB — URINALYSIS, COMPLETE (UACMP) WITH MICROSCOPIC
Bilirubin Urine: NEGATIVE
Glucose, UA: NEGATIVE mg/dL
Ketones, ur: NEGATIVE mg/dL
Nitrite: NEGATIVE
Protein, ur: 100 mg/dL — AB
RBC / HPF: >50 RBC/hpf (ref 0–5)
Specific Gravity, Urine: 1.014 (ref 1.005–1.030)
WBC, UA: >50 WBC/hpf (ref 0–5)
pH: 5 (ref 5.0–8.0)

## 2022-12-30 LAB — CBC WITH DIFFERENTIAL (CANCER CENTER ONLY)
Abs Immature Granulocytes: 0.86 10*3/uL — ABNORMAL HIGH (ref 0.00–0.07)
Basophils Absolute: 0 10*3/uL (ref 0.0–0.1)
Basophils Relative: 1 %
Eosinophils Absolute: 0.1 10*3/uL (ref 0.0–0.5)
Eosinophils Relative: 2 %
HCT: 23.5 % — ABNORMAL LOW (ref 39.0–52.0)
Hemoglobin: 7.7 g/dL — ABNORMAL LOW (ref 13.0–17.0)
Immature Granulocytes: 15 %
Lymphocytes Relative: 15 %
Lymphs Abs: 0.9 10*3/uL (ref 0.7–4.0)
MCH: 29.3 pg (ref 26.0–34.0)
MCHC: 32.8 g/dL (ref 30.0–36.0)
MCV: 89.4 fL (ref 80.0–100.0)
Monocytes Absolute: 0.5 10*3/uL (ref 0.1–1.0)
Monocytes Relative: 9 %
Neutro Abs: 3.5 10*3/uL (ref 1.7–7.7)
Neutrophils Relative %: 58 %
Platelet Count: 45 10*3/uL — ABNORMAL LOW (ref 150–400)
RBC: 2.63 MIL/uL — ABNORMAL LOW (ref 4.22–5.81)
RDW: 17.5 % — ABNORMAL HIGH (ref 11.5–15.5)
Smear Review: NORMAL
WBC Count: 5.9 10*3/uL (ref 4.0–10.5)
nRBC: 5.9 % — ABNORMAL HIGH (ref 0.0–0.2)

## 2022-12-30 LAB — CMP (CANCER CENTER ONLY)
ALT: 8 U/L (ref 0–44)
AST: 29 U/L (ref 15–41)
Albumin: 3.1 g/dL — ABNORMAL LOW (ref 3.5–5.0)
Alkaline Phosphatase: 349 U/L — ABNORMAL HIGH (ref 38–126)
Anion gap: 9 (ref 5–15)
BUN: 23 mg/dL — ABNORMAL HIGH (ref 6–20)
CO2: 23 mmol/L (ref 22–32)
Calcium: 8.5 mg/dL — ABNORMAL LOW (ref 8.9–10.3)
Chloride: 104 mmol/L (ref 98–111)
Creatinine: 1.24 mg/dL (ref 0.61–1.24)
GFR, Estimated: >60 mL/min (ref >60)
Glucose, Bld: 88 mg/dL (ref 70–99)
Potassium: 3.6 mmol/L (ref 3.5–5.1)
Sodium: 136 mmol/L (ref 135–145)
Total Bilirubin: 0.5 mg/dL (ref 0.3–1.2)
Total Protein: 5.6 g/dL — ABNORMAL LOW (ref 6.5–8.1)

## 2022-12-30 LAB — DIC (DISSEMINATED INTRAVASCULAR COAGULATION)PANEL
D-Dimer, Quant: 4.69 ug/mL-FEU — ABNORMAL HIGH (ref 0.00–0.50)
Fibrinogen: 542 mg/dL — ABNORMAL HIGH (ref 210–475)
INR: 1.2 (ref 0.8–1.2)
Platelets: 46 10*3/uL — ABNORMAL LOW (ref 150–400)
Prothrombin Time: 15.3 seconds — ABNORMAL HIGH (ref 11.4–15.2)
Smear Review: NONE SEEN
aPTT: 35 seconds (ref 24–36)

## 2022-12-30 LAB — PREPARE RBC (CROSSMATCH)

## 2022-12-30 MED ORDER — DIPHENHYDRAMINE HCL 25 MG PO CAPS
25.0000 mg | ORAL_CAPSULE | Freq: Once | ORAL | Status: DC
  Filled 2022-12-30: qty 1

## 2022-12-30 MED ORDER — SODIUM CHLORIDE 0.9% IV SOLUTION
250.0000 mL | Freq: Once | INTRAVENOUS | Status: AC
  Administered 2022-12-30: 250 mL via INTRAVENOUS

## 2022-12-30 MED ORDER — CIPROFLOXACIN HCL 500 MG PO TABS: 500.0000 mg | ORAL_TABLET | Freq: Two times a day (BID) | ORAL | 0 refills | Status: AC

## 2022-12-30 MED ORDER — ACETAMINOPHEN 325 MG PO TABS
650.0000 mg | ORAL_TABLET | Freq: Once | ORAL | Status: AC
  Administered 2022-12-30: 650 mg via ORAL
  Filled 2022-12-30: qty 2

## 2022-12-30 MED ORDER — HEPARIN SOD (PORK) LOCK FLUSH 100 UNIT/ML IV SOLN
500.0000 [IU] | Freq: Once | INTRAVENOUS | Status: AC | PRN
  Administered 2022-12-30: 500 [IU]

## 2022-12-30 MED ORDER — SODIUM CHLORIDE 0.9% FLUSH
10.0000 mL | Freq: Once | INTRAVENOUS | Status: AC | PRN
  Administered 2022-12-30: 10 mL

## 2022-12-30 MED ORDER — SODIUM CHLORIDE 0.9 % IV SOLN: Freq: Once | INTRAVENOUS | Status: AC

## 2022-12-30 MED ORDER — OXYCODONE HCL 5 MG PO TABS
15.0000 mg | ORAL_TABLET | Freq: Once | ORAL | Status: AC
  Administered 2022-12-30: 10 mg via ORAL
  Filled 2022-12-30: qty 3

## 2022-12-30 NOTE — Patient Instructions (Signed)

## 2022-12-30 NOTE — Telephone Encounter (Signed)
Pt wife called asking if pt could be seen today by Anda Kraft in [facility].  Wife stated that the pt has n/v with 5 episodes of vomiting overnight.  Pt's urine is concentrated per spouse but pt also has a urostomy.  Wife stated that the pt is extremely fatigue and has been taking in little fluids.  Wife stated the pt might be dehydration and might need IV Fluids.  Wife also stated that the pt's BP had been running low when seen in clinic on Friday by Dr. Burr Medico.  Pt's current BP is 107/70.  Pt c/o headache with 6/10 pain.  Pt taking Acetaminophen with some relief.  Wife stated the pt's blood counts were low when the pt was in the clinic on Friday with Dr. Burr Medico.  Wife stated that Dr Burr Medico stated the pt may need blood products but will monitor for now.  Wife stated she feels the pt might need blood now.  Wife also stated that pt had diarrhea x 2 days last week but has resolved after speaking with Alda Lea, NP in Palliative.  Wife stated pt is having normal BM now.  Informed wife that this RN will speak with [facility] to see if they can see the pt and will f/u once this RN has an answer from [facility].

## 2022-12-30 NOTE — Progress Notes (Signed)
Symptom Management Consult note [place]    Patient Care Team: Coral Spikes, DO as PCP - General (Family Medicine) Truitt Merle, MD as Attending Physician (Hematology and Oncology)    Name / MRN / DOB: Barry Gibson  [id number]  Mar 19, 1961   Date of visit: 12/30/2022   Chief Complaint/Reason for visit: fatigue, dark urine   Current Therapy: Discontinued Padcev on 12/27/22 due to disease progression.  Last treatment:  Day 1   Cycle 3 on 11/29/22   ASSESSMENT & PLAN: Patient is a 62 y.o. male  with oncologic history of metastatic bladder carcinoma followed by Dr. Burr Medico.  I have viewed most recent oncology note and lab work.    #Metastatic bladder cancer -Recently switched providers- now patient of Dr. Burr Medico. At last visit plan was to discontinue Padcev and obtained NGS [place] to see if there are any targetable mutations. Next treatment plan will be decided once results are in. -Given dose of pain medication while in clinic for cancer-related pain. -Patient also given IV fluids for hydration support with his decreased p.o. intake reported. -Next appointment with oncologist is 01/10/23   #UTI -Urostomy with dark urine. No abdominal or CA tenderness. -UA is concerning for UTI with large leukocytes, over 50 WBC and rare bacteria.  Urine culture sent. -With urostomy and stoma in open conduit an accurate UA is difficult to obtain. As his urine has changed from light clear to dark and cloudy and UA has findings to suggest UTI will go ahead and treat patient for UTI. -Prescription for cipro sent to the pharmacy.   #)Anemia and thrombocytopenia -New. Patient with increased bruising, otherwise no obvious bleeding. DIC panel obtained and shows INR 1.2 with mildly elevated fibrinogen 542 and d dimer of 4.69. Elevated dimer is consistent with known cancer, he denies any symptoms to suggest PE at this time. -CBC today shows downtrending hemoglobin 7.7 from 8.1 x 3 days  ago and platelet count of 45k which is also decreasing compared to recent labs at [facility]. -We discussed some of the risks, benefits, and alternatives of blood transfusions. The patient is symptomatic from anemia and the hemoglobin level is critically low. Some of the side-effects to be expected including risks of transfusion reactions, chills, infection, syndrome of volume overload and risk of hospitalization from various reasons and the patient is willing to proceed and went ahead to sign consent today. -Patient given 1 unit of blood today in clinic. -As patient's anemia and thrombocytopenia continue to worsen I discussed plan with oncologist.  She is agreeable with the transfusion and recommends bringing patient back to clinic in 2 days for repeat labs.   Heme/Onc History: Oncology History Overview Note   Cancer Staging  Bladder cancer [facility]) Staging form: Urinary Bladder, AJCC 8th Edition - Clinical: Stage IVB (cT4, cN2, cM1b) - Signed by Wyatt Portela, MD on 05/18/2021 WHO/ISUP grade (low/high): High Grade Histologic grading system: 2 grade system     Bladder cancer ([place])  03/06/2020 Initial Diagnosis   Bladder cancer ([place])   11/29/2020 Pathology Results     FINAL MICROSCOPIC DIAGNOSIS:  A. URETER, LEFT DISTAL MARGIN, BIOPSY: Benign ureter with chronic inflammation  B. URETER, RIGHT DISTAL MARGIN, BIOPSY: Benign ureter with chronic inflammation and BCG granulomas  C. LYMPH NODE, LEFT COMMON ILIAC, EXCISION: One benign lymph node (0/1)  D. LYMPH NODE, SENTINEL, LEFT EXTERNAL ILIAC, EXCISION: One benign lymph node (0/1)  E. LYMPH NODES, SENTINEL, LEFT INTERNAL ILIAC AND OBTURATOR,  EXCISION: Metastatic urothelial carcinoma in one of two lymph nodes (1/2, 1 mm)  F. LYMPH NODE, SENTINEL, RIGHT PELVIC, EXCISION: Metastatic urothelial carcinoma in three of three lymph nodes (3/3, 3.0 cm with extra nodal extension, pN2)  G. URETER, FINAL LEFT DISTAL MARGIN, BIOPSY: Benign ureter  with chronic inflammation  H. 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 URETER, FINAL RIGHT DISTAL MARGIN, BIOPSY: Benign ureter     05/18/2021 Cancer Staging   Staging form: Urinary Bladder, AJCC 8th Edition - Clinical: Stage IVB (cT4, cN2, cM1b) - Signed by Wyatt Portela, MD on 05/18/2021 WHO/ISUP grade (low/high): High Grade Histologic grading system: 2 grade system   06/08/2021 - 08/17/2021 Chemotherapy   Patient is on Treatment Plan : BLADDER Cisplatin D1 + Gemcitabine D1,8 q21d x 6 Cycles     09/07/2021 - 06/07/2022 Chemotherapy   Patient is on Treatment Plan : HEAD/NECK Pembrolizumab      09/07/2021 - 07/19/2022 Chemotherapy   Patient is on Treatment Plan : ANUS Pembrolizumab (400) q42d     09/27/2022 -  Chemotherapy   Patient is on Treatment Plan : UROTHELIAL LOCALLY ADVANCED, METASTATIC Enfortumab D1,8,15 q28d     11/21/2022 Imaging    IMPRESSION: 1. Relatively diffuse osseous metastasis, sclerotic less than lytic, newly apparent since 09/16/2022. This could represent development of osseous metastasis or healing of previously CT occult metastasis. 2. Otherwise, response to therapy of pelvic nodal and peritoneal metastasis. 3. No extra osseous metastasis within the chest. 4. Incidental findings, including: Aortic atherosclerosis (ICD10-I70.0), coronary artery atherosclerosis and emphysema (ICD10-J43.9). Cholelithiasis. Fat and nonobstructive small bowel containing ventral abdominal wall hernias.     12/10/2022 Imaging    IMPRESSION: 1. Diffuse osseous metastatic disease to the spine, ribs, sacrum and visualized pelvic bones. No associated fracture  or retropulsion. 2. Degenerative changes of the lumbar spine with mild spinal canal stenosis at L3-4 and L4-5 and moderate narrowing of the bilateral subarticular zones and moderate bilateral neural foraminal narrowing at L3-4. 3. Moderate narrowing of the left subarticular zone and moderate left neural foraminal narrowing at L5-S1. 4. Cholelithiasis. 5. Stable 4 cm infrarenal aortic aneurysm.     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 male with oncologic history as above presenting to [facility] today with chief complaint of fatigue and dizziness.  He is accompanied by his significant other who provides majority of the history.  She states last week patient overall has been feeling improved.  His symptoms significantly worsen x 3 days ago.  She noticed patient has been extremely fatigued and weak.  His worst day was yesterday she spent most of the day sleeping.  He has had minimal p.o. intake, may be 20 ounces of fluids per day.  He has had good appetite when awake.  Patient endorsed feeling dizzy when changing positions.  Denies any head injury or fall.  Significant other has been closely monitoring his blood pressure at home and systolic has ranged from [id number].  She also notices that patient has been bruising easier.  He has multiple bruises on his arm from lightly playing with his  dog which is unusual for him. He noticed decrease and dark urine output in his ostomy bag over the last 2 days as well. He has not had any fever. Took pain medicine PTA. He denies any chest pain or shortness of breath. Denies any obvious blood loss. His constipation he was struggling with recently has resolved and bowel movements are happening regularly.      ROS  All other systems are reviewed and are negative for acute change except as noted in the HPI.    Allergies  Allergen  Reactions   Ace Inhibitors Cough   Diflucan [Fluconazole] Other (See Comments)    Irritated ulcers   Morphine Nausea And Vomiting     Past Medical History:  Diagnosis Date   Cancer ([place])    bladder   Cataract    COVID-19 11/13/2019   h/a, chills, fatigue, loss of taste and smell, all symptoms resolved in a few weeks   Frequent urination    Fuchs' corneal dystrophy    Full dentures    GERD (gastroesophageal reflux disease)    Hypertension    Rheumatoid arthritis ([place])      Past Surgical History:  Procedure Laterality Date   CORNEAL TRANSPLANT Left 2013   CYSTOSCOPY WITH BIOPSY N/A 09/14/2019   Procedure: CYSTOSCOPY WITH BIOPSY;  Surgeon: Franchot Gallo, MD;  Location: [facility];  Service: Urology;  Laterality: N/A;   CYSTOSCOPY WITH INJECTION N/A 11/29/2020   Procedure: CYSTOSCOPY WITH INJECTION OF INDOCYANINE GREEN DYE;  Surgeon: Alexis Frock, MD;  Location: [facility];  Service: Urology;  Laterality: N/A;  6 HRS   FULGURATION OF BLADDER TUMOR N/A 09/14/2019   Procedure: FULGURATION OF BLADDER TUMOR;  Surgeon: Franchot Gallo, MD;  Location: [facility];  Service: Urology;  Laterality: N/A;   IR IMAGING GUIDED PORT INSERTION  06/07/2021   IR NEPHROSTOMY EXCHANGE LEFT  10/30/2020   IR NEPHROSTOMY EXCHANGE LEFT  11/19/2020   IR NEPHROSTOMY EXCHANGE RIGHT  10/30/2020   IR NEPHROSTOMY EXCHANGE RIGHT  11/19/2020   IR NEPHROSTOMY PLACEMENT LEFT  09/27/2020   IR NEPHROSTOMY PLACEMENT RIGHT  09/27/2020   ROBOT ASSISTED LAPAROSCOPIC COMPLETE CYSTECT ILEAL CONDUIT N/A 11/29/2020   Procedure: XI ROBOTIC ASSISTED LAPAROSCOPIC COMPLETE CYSTECT ILEAL CONDUIT/RADICAL PROSTATECTOMY/LYMPHADENECTOMY;  Surgeon: Alexis Frock, MD;  Location: [facility];  Service: Urology;  Laterality: N/A;   TRANSURETHRAL RESECTION OF BLADDER TUMOR WITH MITOMYCIN-C N/A 08/05/2019   Procedure: TRANSURETHRAL RESECTION OF BLADDER TUMOR;  Surgeon: Franchot Gallo, MD;  Location: [facility];  Service:  Urology;  Laterality: N/A;  1 HR   TRANSURETHRAL RESECTION OF BLADDER TUMOR WITH MITOMYCIN-C N/A 04/24/2020   Procedure: TRANSURETHRAL RESECTION OF BLADDER TUMOR WITH GEMCIDABINE;  Surgeon: Franchot Gallo, MD;  Location: [facility];  Service: Urology;  Laterality: N/A;    Social History   Socioeconomic History   Marital status: Married    Spouse name: Not on file   Number of children: Not on file   Years of education: Not on file   Highest education level: Not on file  Occupational History   Not on file  Tobacco Use   Smoking status: Former    Packs/day: 0.50    Years: 20.00    Total pack years: 10.00    Types: Cigarettes   Smokeless tobacco: Never   Tobacco comments:    quit 2010  Vaping Use   Vaping Use: Never used  Substance and Sexual Activity   Alcohol use: Yes    Comment: occ  2 x month   Drug use: No   Sexual activity: Yes  Other Topics Concern   Not on file  Social History Narrative   Not on file   Social Determinants of Health   Financial Resource Strain: Not on file  Food Insecurity: No Food Insecurity (12/14/2022)   Hunger Vital Sign    Worried About Running Out of Food in the Last Year: Never true    Ran Out of Food in the Last Year: Never true  Transportation Needs: No Transportation Needs (12/14/2022)   PRAPARE - Hydrologist (Medical): No    Lack of Transportation (Non-Medical): No  Physical Activity: Not on file  Stress: Not on file  Social Connections: Not on file  Intimate Partner Violence: Not At Risk (12/14/2022)   Humiliation, Afraid, Rape, and Kick questionnaire    Fear of Current or Ex-Partner: No    Emotionally Abused: No    Physically Abused: No    Sexually Abused: No    No family history on file.   Current Outpatient Medications:    acetaminophen (TYLENOL) 650 MG CR tablet, Take 1,300 mg by mouth every morning., Disp: , Rfl:    ARIPiprazole (ABILIFY) 5 MG tablet, Take 1 tablet (5 mg  total) by mouth daily., Disp: 90 tablet, Rfl: 1   ciprofloxacin (CIPRO) 500 MG tablet, Take 1 tablet (500 mg total) by mouth 2 (two) times daily for 5 days., Disp: 10 tablet, Rfl: 0   dexamethasone (DECADRON) 4 MG tablet, Take 1 tablet (4 mg total) by mouth 2 (two) times daily. Take for 3 days after each chemotherapy treatment, Disp: 24 tablet, Rfl: 3   diazepam (VALIUM) 2 MG tablet, Take 1 tablet (2 mg total) by mouth every 8 (eight) hours as needed for anxiety., Disp: 45 tablet, Rfl: 0   diclofenac Sodium (VOLTAREN) 1 % GEL, Apply 4 g topically 4 (four) times daily. Apply to knees, Disp: 100 g, Rfl: 1   gabapentin (NEURONTIN) 300 MG capsule, Take 1 capsule (300 mg total) by mouth at bedtime., Disp: 15 capsule, Rfl: 0   hydroxychloroquine (PLAQUENIL) 200 MG tablet, Take 200 mg by mouth 2 (two) times daily., Disp: , Rfl:    Lactulose 20 GM/30ML SOLN, Take 30 mLs (20 g total) by mouth 2 (two) times daily as needed (take 2 times daily as needed for constipation)., Disp: 237 mL, Rfl: 2   lidocaine (XYLOCAINE) 5 % ointment, Apply 1 Application topically 3 (three) times daily as needed. Use no more than 6 inches of ointment per application, Disp: [id number] g, Rfl: 0   Multiple Vitamin (MULTIVITAMIN WITH MINERALS) TABS tablet, Take 1 tablet by mouth daily., Disp: , Rfl:    omeprazole (PRILOSEC) 40 MG capsule, Take 40 mg by mouth daily., Disp: , Rfl:    ondansetron (ZOFRAN-ODT) 4 MG disintegrating tablet, Take 1 tablet (4 mg total) by mouth every 8 (eight) hours as needed for nausea or vomiting., Disp: 20 tablet, Rfl: 1   oxyCODONE (ROXICODONE) 15 MG immediate release tablet, Take 1 tablet (15 mg total) by mouth every 4 (four) hours as needed for severe pain., Disp: 90 tablet, Rfl: 0   oxyCODONE 10 MG TABS, Take 1 tablet (10 mg total) by mouth every 6 (six) hours as needed for breakthrough pain., Disp: 20 tablet, Rfl: 0   prednisoLONE acetate (PRED FORTE) 1 % ophthalmic suspension, Place 1 drop into the left eye  2 (two) times a week. Instill 1 drop into left  eye Monday and Friday., Disp: , Rfl:    predniSONE (DELTASONE) 10 MG tablet, Take 1 tablet (10 mg total) by mouth daily. Take 4 pills daily for 3 days, 3 pills daily for 3 days, 2 pills daily for 3 days, 1 pill daily for 3 days then stop, Disp: 30 tablet, Rfl: 0   predniSONE (DELTASONE) 5 MG tablet, Take 4 tablets (20 mg total) by mouth daily as needed (flare up). Can resume after finishing the tapering dose, Disp: , Rfl:    promethazine (PHENERGAN) 25 MG tablet, Take 1 tablet (25 mg total) by mouth every 6 (six) hours as needed for nausea or vomiting., Disp: 30 tablet, Rfl: 0   rosuvastatin (CRESTOR) 20 MG tablet, Take 1 tablet (20 mg total) by mouth daily., Disp: 90 tablet, Rfl: 1   sertraline (ZOLOFT) 100 MG tablet, Take 1 tablet (100 mg total) by mouth daily., Disp: 90 tablet, Rfl: 3 No current facility-administered medications for this visit.  Facility-Administered Medications Ordered in Other Visits:    acetaminophen (TYLENOL) 325 MG tablet, , , ,    diphenhydrAMINE (BENADRYL) 25 mg capsule, , , ,    diphenhydrAMINE (BENADRYL) capsule 25 mg, 25 mg, Oral, Once, Walisiewicz, Loic Hobin E, PA-C   gemcitabine (GEMZAR) chemo syringe for bladder instillation 2,000 mg, 2,000 mg, Bladder Instillation, Once, Franchot Gallo, MD   gemcitabine [facility]) chemo syringe for bladder instillation 2,000 mg, 2,000 mg, Bladder Instillation, Once, Dahlstedt, Stephen, MD  PHYSICAL EXAM: ECOG FS:3 - Symptomatic, >50% confined to bed    Vitals:   12/30/22 1132  BP: 96/65  Pulse: 89  Resp: 18  Temp: (!) 97.5 F (36.4 C)  TempSrc: Temporal  SpO2: 98%  Height: 5' 11"$  (1.803 m)   Physical Exam Vitals and nursing note reviewed.  Constitutional:      Appearance: He is well-developed. He is ill-appearing. He is not toxic-appearing.  HENT:     Head: Normocephalic.     Nose: Nose normal.     Mouth/Throat:     Mouth: Mucous membranes are dry.     Pharynx:  Oropharynx is clear.  Eyes:     Conjunctiva/sclera: Conjunctivae normal.  Neck:     Vascular: No JVD.  Cardiovascular:     Rate and Rhythm: Normal rate and regular rhythm.     Pulses: Normal pulses.     Heart sounds: Normal heart sounds.  Pulmonary:     Effort: Pulmonary effort is normal.     Breath sounds: Normal breath sounds.  Abdominal:     General: There is no distension.     Palpations: Abdomen is soft. There is no mass.     Tenderness: There is no abdominal tenderness. There is no right CVA tenderness, left CVA tenderness, guarding or rebound.     Hernia: No hernia is present.     Comments: Ostomy bag with dark cloudy urine  Musculoskeletal:        General: Normal range of motion.     Cervical back: Normal range of motion.  Skin:    General: Skin is warm and dry.     Findings: Bruising (right forearm) present.  Neurological:     Mental Status: He is oriented to person, place, and time.     Comments: No focal weakness Patient with clear speech. Follows commands  Psychiatric:     Comments: lethargic        LABORATORY DATA: I have reviewed the data as listed    Latest Ref Rng & Units  12/30/2022    1:47 PM 12/30/2022   12:00 PM 12/27/2022    9:29 AM  CBC  WBC 4.0 - 10.5 K/uL  5.9  4.7   Hemoglobin 13.0 - 17.0 g/dL  7.7  8.1   Hematocrit 39.0 - 52.0 %  23.5  25.3   Platelets 150 - 400 K/uL 46  45  72         Latest Ref Rng & Units 12/30/2022   12:00 PM 12/27/2022    9:29 AM 12/17/2022    3:35 PM  CMP  Glucose 70 - 99 mg/dL 88  102  109   BUN 6 - 20 mg/dL 23  26  28   $ Creatinine 0.61 - 1.24 mg/dL 1.24  1.41  1.14   Sodium 135 - 145 mmol/L 136  138  136   Potassium 3.5 - 5.1 mmol/L 3.6  3.7  4.1   Chloride 98 - 111 mmol/L 104  107  107   CO2 22 - 32 mmol/L 23  24  20   $ Calcium 8.9 - 10.3 mg/dL 8.5  8.3  8.6   Total Protein 6.5 - 8.1 g/dL 5.6  5.6  5.9   Total Bilirubin 0.3 - 1.2 mg/dL 0.5  0.4  0.4   Alkaline Phos 38 - 126 U/L 349  385  503   AST 15 - 41 U/L  29  30  29   $ ALT 0 - 44 U/L 8  8  9        $ RADIOGRAPHIC STUDIES (from last 24 hours if applicable) I have personally reviewed the radiological images as listed and agreed with the findings in the report. No results found.      Visit Diagnosis: 1. Anemia, unspecified type   2. Thrombocytopenia ([place])   3. 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 Neoplasm related pain   5. Urinary tract infection without hematuria, site unspecified      Orders Placed This Encounter  Procedures   Informed Consent Details: Physician/Practitioner Attestation; Transcribe to consent form and obtain patient signature    Standing Status:   Future    Standing Expiration Date:   12/31/2023    Order Specific Question:   Physician/Practitioner attestation of informed consent for blood and or blood product transfusion    Answer:   I, the physician/practitioner, attest that I have discussed with the patient the benefits, risks, side effects, alternatives, likelihood of achieving goals and potential problems during recovery for the procedure that I have provided informed consent.    Order Specific Question:   Product(s)    Answer:   All Product(s)    All questions were answered. The patient knows to call the clinic with any problems, questions or concerns. No barriers to learning was detected.  I have spent a total of 30 minutes minutes of face-to-face and non-face-to-face time, preparing to see the patient, obtaining and/or reviewing separately obtained history, performing a medically appropriate examination, counseling and educating the patient, ordering tests, documenting clinical information in the electronic health record, and care coordination (communications with other health care professionals or caregivers).    Thank you for allowing me to participate in the care of this patient.    Barrie Folk, PA-C Department of Hematology/Oncology [facility] Phone: [phone]  [phone]    12/30/2022 4:29 PM

## 2022-12-31 ENCOUNTER — Inpatient Hospital Stay (HOSPITAL_COMMUNITY)
Admission: EM | Admit: 2022-12-31 | Discharge: 2023-01-17 | DRG: 871 | Disposition: E | Payer: BC Managed Care – PPO | Attending: Family Medicine | Admitting: Family Medicine

## 2022-12-31 ENCOUNTER — Emergency Department (HOSPITAL_COMMUNITY): Payer: BC Managed Care – PPO

## 2022-12-31 ENCOUNTER — Telehealth: Payer: Self-pay

## 2022-12-31 ENCOUNTER — Other Ambulatory Visit: Payer: Self-pay

## 2022-12-31 ENCOUNTER — Encounter (HOSPITAL_COMMUNITY): Payer: Self-pay

## 2022-12-31 DIAGNOSIS — D65 Disseminated intravascular coagulation [defibrination syndrome]: Secondary | ICD-10-CM | POA: Diagnosis present

## 2022-12-31 DIAGNOSIS — I10 Essential (primary) hypertension: Secondary | ICD-10-CM | POA: Diagnosis not present

## 2022-12-31 DIAGNOSIS — N390 Urinary tract infection, site not specified: Secondary | ICD-10-CM | POA: Diagnosis present

## 2022-12-31 DIAGNOSIS — D630 Anemia in neoplastic disease: Secondary | ICD-10-CM | POA: Diagnosis present

## 2022-12-31 DIAGNOSIS — Z888 Allergy status to other drugs, medicaments and biological substances status: Secondary | ICD-10-CM

## 2022-12-31 DIAGNOSIS — C679 Malignant neoplasm of bladder, unspecified: Secondary | ICD-10-CM | POA: Diagnosis present

## 2022-12-31 DIAGNOSIS — Z7952 Long term (current) use of systemic steroids: Secondary | ICD-10-CM

## 2022-12-31 DIAGNOSIS — K921 Melena: Secondary | ICD-10-CM | POA: Diagnosis present

## 2022-12-31 DIAGNOSIS — Z66 Do not resuscitate: Secondary | ICD-10-CM | POA: Diagnosis present

## 2022-12-31 DIAGNOSIS — M069 Rheumatoid arthritis, unspecified: Secondary | ICD-10-CM | POA: Diagnosis present

## 2022-12-31 DIAGNOSIS — G9341 Metabolic encephalopathy: Secondary | ICD-10-CM | POA: Diagnosis present

## 2022-12-31 DIAGNOSIS — E785 Hyperlipidemia, unspecified: Secondary | ICD-10-CM | POA: Diagnosis present

## 2022-12-31 DIAGNOSIS — D6959 Other secondary thrombocytopenia: Secondary | ICD-10-CM | POA: Diagnosis present

## 2022-12-31 DIAGNOSIS — D649 Anemia, unspecified: Secondary | ICD-10-CM | POA: Diagnosis not present

## 2022-12-31 DIAGNOSIS — D696 Thrombocytopenia, unspecified: Secondary | ICD-10-CM

## 2022-12-31 DIAGNOSIS — N183 Chronic kidney disease, stage 3 unspecified: Secondary | ICD-10-CM | POA: Diagnosis not present

## 2022-12-31 DIAGNOSIS — R410 Disorientation, unspecified: Secondary | ICD-10-CM

## 2022-12-31 DIAGNOSIS — I129 Hypertensive chronic kidney disease with stage 1 through stage 4 chronic kidney disease, or unspecified chronic kidney disease: Secondary | ICD-10-CM | POA: Diagnosis present

## 2022-12-31 DIAGNOSIS — N1831 Chronic kidney disease, stage 3a: Secondary | ICD-10-CM | POA: Diagnosis present

## 2022-12-31 DIAGNOSIS — Z1152 Encounter for screening for COVID-19: Secondary | ICD-10-CM

## 2022-12-31 DIAGNOSIS — A4153 Sepsis due to Serratia: Secondary | ICD-10-CM | POA: Diagnosis present

## 2022-12-31 DIAGNOSIS — I959 Hypotension, unspecified: Secondary | ICD-10-CM | POA: Diagnosis present

## 2022-12-31 DIAGNOSIS — R748 Abnormal levels of other serum enzymes: Secondary | ICD-10-CM | POA: Diagnosis present

## 2022-12-31 DIAGNOSIS — N289 Disorder of kidney and ureter, unspecified: Secondary | ICD-10-CM

## 2022-12-31 DIAGNOSIS — A419 Sepsis, unspecified organism: Secondary | ICD-10-CM | POA: Diagnosis present

## 2022-12-31 DIAGNOSIS — E871 Hypo-osmolality and hyponatremia: Secondary | ICD-10-CM | POA: Diagnosis present

## 2022-12-31 DIAGNOSIS — Z87891 Personal history of nicotine dependence: Secondary | ICD-10-CM

## 2022-12-31 DIAGNOSIS — Z906 Acquired absence of other parts of urinary tract: Secondary | ICD-10-CM

## 2022-12-31 DIAGNOSIS — R7401 Elevation of levels of liver transaminase levels: Secondary | ICD-10-CM

## 2022-12-31 DIAGNOSIS — Z8616 Personal history of COVID-19: Secondary | ICD-10-CM

## 2022-12-31 DIAGNOSIS — Z95828 Presence of other vascular implants and grafts: Secondary | ICD-10-CM | POA: Diagnosis not present

## 2022-12-31 DIAGNOSIS — Z515 Encounter for palliative care: Secondary | ICD-10-CM | POA: Diagnosis not present

## 2022-12-31 DIAGNOSIS — C7951 Secondary malignant neoplasm of bone: Secondary | ICD-10-CM | POA: Diagnosis present

## 2022-12-31 DIAGNOSIS — G893 Neoplasm related pain (acute) (chronic): Secondary | ICD-10-CM | POA: Diagnosis present

## 2022-12-31 DIAGNOSIS — Z947 Corneal transplant status: Secondary | ICD-10-CM

## 2022-12-31 DIAGNOSIS — K219 Gastro-esophageal reflux disease without esophagitis: Secondary | ICD-10-CM | POA: Diagnosis present

## 2022-12-31 DIAGNOSIS — K254 Chronic or unspecified gastric ulcer with hemorrhage: Secondary | ICD-10-CM | POA: Diagnosis present

## 2022-12-31 DIAGNOSIS — J90 Pleural effusion, not elsewhere classified: Secondary | ICD-10-CM | POA: Diagnosis present

## 2022-12-31 DIAGNOSIS — Z959 Presence of cardiac and vascular implant and graft, unspecified: Secondary | ICD-10-CM

## 2022-12-31 DIAGNOSIS — R221 Localized swelling, mass and lump, neck: Secondary | ICD-10-CM | POA: Diagnosis present

## 2022-12-31 DIAGNOSIS — Z885 Allergy status to narcotic agent status: Secondary | ICD-10-CM

## 2022-12-31 DIAGNOSIS — Z79899 Other long term (current) drug therapy: Secondary | ICD-10-CM

## 2022-12-31 DIAGNOSIS — C670 Malignant neoplasm of trigone of bladder: Secondary | ICD-10-CM | POA: Diagnosis not present

## 2022-12-31 DIAGNOSIS — R6520 Severe sepsis without septic shock: Secondary | ICD-10-CM | POA: Diagnosis present

## 2022-12-31 LAB — URINALYSIS, W/ REFLEX TO CULTURE (INFECTION SUSPECTED)
Bilirubin Urine: NEGATIVE
Glucose, UA: NEGATIVE mg/dL
Ketones, ur: 5 mg/dL — AB
Nitrite: NEGATIVE
Protein, ur: 100 mg/dL — AB
RBC / HPF: >50 RBC/hpf (ref 0–5)
Specific Gravity, Urine: 1.011 (ref 1.005–1.030)
WBC, UA: >50 WBC/hpf (ref 0–5)
pH: 5 (ref 5.0–8.0)

## 2022-12-31 LAB — CBC WITH DIFFERENTIAL/PLATELET
Abs Immature Granulocytes: 0.4 10*3/uL — ABNORMAL HIGH (ref 0.00–0.07)
Band Neutrophils: 9 %
Basophils Absolute: 0.1 10*3/uL (ref 0.0–0.1)
Basophils Relative: 1 %
Eosinophils Absolute: 0.1 10*3/uL (ref 0.0–0.5)
Eosinophils Relative: 2 %
HCT: 29.4 % — ABNORMAL LOW (ref 39.0–52.0)
Hemoglobin: 9.3 g/dL — ABNORMAL LOW (ref 13.0–17.0)
Lymphocytes Relative: 15 %
Lymphs Abs: 1.1 10*3/uL (ref 0.7–4.0)
MCH: 28.5 pg (ref 26.0–34.0)
MCHC: 31.6 g/dL (ref 30.0–36.0)
MCV: 90.2 fL (ref 80.0–100.0)
Metamyelocytes Relative: 2 %
Monocytes Absolute: 0.3 10*3/uL (ref 0.1–1.0)
Monocytes Relative: 4 %
Myelocytes: 4 %
Neutro Abs: 5.1 10*3/uL (ref 1.7–7.7)
Neutrophils Relative %: 63 %
Platelets: 40 10*3/uL — ABNORMAL LOW (ref 150–400)
RBC: 3.26 MIL/uL — ABNORMAL LOW (ref 4.22–5.81)
RDW: 17.3 % — ABNORMAL HIGH (ref 11.5–15.5)
WBC: 7.1 10*3/uL (ref 4.0–10.5)
nRBC: 2 /100 WBC — ABNORMAL HIGH
nRBC: 5 % — ABNORMAL HIGH (ref 0.0–0.2)

## 2022-12-31 LAB — COMPREHENSIVE METABOLIC PANEL
ALT: 11 U/L (ref 0–44)
AST: 42 U/L — ABNORMAL HIGH (ref 15–41)
Albumin: 2.8 g/dL — ABNORMAL LOW (ref 3.5–5.0)
Alkaline Phosphatase: 333 U/L — ABNORMAL HIGH (ref 38–126)
Anion gap: 10 (ref 5–15)
BUN: 25 mg/dL — ABNORMAL HIGH (ref 6–20)
CO2: 20 mmol/L — ABNORMAL LOW (ref 22–32)
Calcium: 8.3 mg/dL — ABNORMAL LOW (ref 8.9–10.3)
Chloride: 103 mmol/L (ref 98–111)
Creatinine, Ser: 1.3 mg/dL — ABNORMAL HIGH (ref 0.61–1.24)
GFR, Estimated: >60 mL/min (ref >60)
Glucose, Bld: 85 mg/dL (ref 70–99)
Potassium: 4.2 mmol/L (ref 3.5–5.1)
Sodium: 133 mmol/L — ABNORMAL LOW (ref 135–145)
Total Bilirubin: 0.8 mg/dL (ref 0.3–1.2)
Total Protein: 5.9 g/dL — ABNORMAL LOW (ref 6.5–8.1)

## 2022-12-31 LAB — BPAM RBC
Blood Product Expiration Date: 202403112359
ISSUE DATE / TIME: 202402121441
Unit Type and Rh: 5100

## 2022-12-31 LAB — TYPE AND SCREEN
ABO/RH(D): O POS
Antibody Screen: NEGATIVE
Unit division: 0

## 2022-12-31 LAB — LACTIC ACID, PLASMA
Lactic Acid, Venous: 0.9 mmol/L (ref 0.5–1.9)
Lactic Acid, Venous: 0.9 mmol/L (ref 0.5–1.9)

## 2022-12-31 LAB — RESP PANEL BY RT-PCR (RSV, FLU A&B, COVID)  RVPGX2
Influenza A by PCR: NEGATIVE
Influenza B by PCR: NEGATIVE
Resp Syncytial Virus by PCR: NEGATIVE
SARS Coronavirus 2 by RT PCR: NEGATIVE

## 2022-12-31 LAB — MRSA NEXT GEN BY PCR, NASAL: MRSA by PCR Next Gen: NOT DETECTED

## 2022-12-31 LAB — APTT: aPTT: 36 seconds (ref 24–36)

## 2022-12-31 LAB — PROTIME-INR
INR: 1.2 (ref 0.8–1.2)
Prothrombin Time: 14.9 seconds (ref 11.4–15.2)

## 2022-12-31 LAB — HIV ANTIBODY (ROUTINE TESTING W REFLEX): HIV Screen 4th Generation wRfx: NONREACTIVE

## 2022-12-31 MED ORDER — HYDROMORPHONE HCL 1 MG/ML IJ SOLN
1.0000 mg | Freq: Once | INTRAMUSCULAR | Status: AC
  Administered 2022-12-31: 1 mg via INTRAVENOUS
  Filled 2022-12-31: qty 1

## 2022-12-31 MED ORDER — VANCOMYCIN HCL 1500 MG/300ML IV SOLN: 1500.0000 mg | Freq: Once | INTRAVENOUS | Status: DC

## 2022-12-31 MED ORDER — SODIUM CHLORIDE 0.9 % IV SOLN: 2.0000 g | INTRAVENOUS | Status: DC

## 2022-12-31 MED ORDER — SODIUM CHLORIDE 0.9 % IV SOLN
2.0000 g | Freq: Three times a day (TID) | INTRAVENOUS | Status: DC
  Administered 2022-12-31 – 2023-01-01 (×2): 2 g via INTRAVENOUS
  Filled 2022-12-31 (×2): qty 12.5

## 2022-12-31 MED ORDER — LACTATED RINGERS IV SOLN: INTRAVENOUS | Status: AC

## 2022-12-31 MED ORDER — ONDANSETRON HCL 4 MG PO TABS: 4.0000 mg | ORAL_TABLET | Freq: Four times a day (QID) | ORAL | Status: DC | PRN

## 2022-12-31 MED ORDER — DIAZEPAM 2 MG PO TABS: 2.0000 mg | ORAL_TABLET | Freq: Three times a day (TID) | ORAL | Status: DC | PRN

## 2022-12-31 MED ORDER — ACETAMINOPHEN 325 MG PO TABS
650.0000 mg | ORAL_TABLET | Freq: Once | ORAL | Status: AC
  Administered 2022-12-31: 650 mg via ORAL
  Filled 2022-12-31: qty 2

## 2022-12-31 MED ORDER — SODIUM CHLORIDE 0.9 % IV SOLN
2.0000 g | Freq: Once | INTRAVENOUS | Status: AC
  Administered 2022-12-31: 2 g via INTRAVENOUS
  Filled 2022-12-31: qty 20

## 2022-12-31 MED ORDER — ONDANSETRON HCL 4 MG/2ML IJ SOLN
4.0000 mg | Freq: Once | INTRAMUSCULAR | Status: AC
  Administered 2022-12-31: 4 mg via INTRAVENOUS
  Filled 2022-12-31: qty 2

## 2022-12-31 MED ORDER — OXYCODONE HCL 5 MG PO TABS
15.0000 mg | ORAL_TABLET | ORAL | Status: DC | PRN
  Administered 2022-12-31 – 2023-01-05 (×11): 15 mg via ORAL
  Filled 2022-12-31 (×12): qty 3

## 2022-12-31 MED ORDER — ACETAMINOPHEN 325 MG PO TABS
650.0000 mg | ORAL_TABLET | Freq: Four times a day (QID) | ORAL | Status: DC | PRN
  Administered 2023-01-02 – 2023-01-04 (×2): 650 mg via ORAL
  Filled 2022-12-31 (×2): qty 2

## 2022-12-31 MED ORDER — ARIPIPRAZOLE 5 MG PO TABS
5.0000 mg | ORAL_TABLET | Freq: Every day | ORAL | Status: DC
  Administered 2022-12-31 – 2023-01-04 (×4): 5 mg via ORAL
  Filled 2022-12-31 (×4): qty 1

## 2022-12-31 MED ORDER — HYDROMORPHONE HCL 1 MG/ML IJ SOLN
0.5000 mg | INTRAMUSCULAR | Status: DC | PRN
  Administered 2022-12-31 – 2023-01-04 (×16): 1 mg via INTRAVENOUS
  Filled 2022-12-31 (×16): qty 1

## 2022-12-31 MED ORDER — LACTATED RINGERS IV BOLUS (SEPSIS)
1000.0000 mL | Freq: Once | INTRAVENOUS | Status: AC
  Administered 2022-12-31: 1000 mL via INTRAVENOUS

## 2022-12-31 MED ORDER — CHLORHEXIDINE GLUCONATE CLOTH 2 % EX PADS
6.0000 | MEDICATED_PAD | Freq: Every day | CUTANEOUS | Status: DC
  Administered 2022-12-31 – 2023-01-03 (×4): 6 via TOPICAL

## 2022-12-31 MED ORDER — GABAPENTIN 300 MG PO CAPS
300.0000 mg | ORAL_CAPSULE | Freq: Every day | ORAL | Status: DC
  Administered 2022-12-31 – 2023-01-03 (×4): 300 mg via ORAL
  Filled 2022-12-31 (×4): qty 1

## 2022-12-31 MED ORDER — VANCOMYCIN HCL 1750 MG/350ML IV SOLN
1750.0000 mg | INTRAVENOUS | Status: DC
  Administered 2022-12-31: 1750 mg via INTRAVENOUS
  Filled 2022-12-31: qty 350

## 2022-12-31 MED ORDER — ACETAMINOPHEN 650 MG RE SUPP
650.0000 mg | Freq: Four times a day (QID) | RECTAL | Status: DC | PRN
  Administered 2022-12-31 – 2023-01-05 (×5): 650 mg via RECTAL
  Filled 2022-12-31 (×5): qty 1

## 2022-12-31 MED ORDER — ONDANSETRON HCL 4 MG/2ML IJ SOLN
4.0000 mg | Freq: Four times a day (QID) | INTRAMUSCULAR | Status: DC | PRN
  Administered 2023-01-03 – 2023-01-04 (×2): 4 mg via INTRAVENOUS
  Filled 2022-12-31: qty 2

## 2022-12-31 NOTE — Telephone Encounter (Signed)
Pt's spouse called and LVM stating that the pt was sent to the hospital overnight by EMS.  Pt is currently admitted at [facility] ICU.  Pt's spouse LVM for both Dr. Burr Medico and [facility] just to make everyone aware of the pt's hospital admission.  Notified Dr. Burr Medico of the pt's spouse's voicemail.

## 2022-12-31 NOTE — Telephone Encounter (Signed)
This RN returned call from patient's wife, Barnett Applebaum, regarding patient's hospital admission. Barnett Applebaum wanted to make sure that Dr. Burr Medico is aware and that patient will not be coming in for [facility] appointment on 2/14. Sherilyn Cooter, RN, aware and will inform Dr. Burr Medico.

## 2022-12-31 NOTE — H&P (Signed)
History and Physical    Barry Gibson [id number] DOB: 1961/06/14 DOA: 01/10/2023  PCP: Coral Spikes, DO   Patient coming from: Home  Chief Complaint: Fever and altered mentation  HPI: Barry Gibson is a 62 y.o. male with medical history significant for hypertension, hyperlipidemia, rheumatoid arthritis, metastatic bladder cancer status post chemo/radiation, and cystectomy and ileal conduit placement who presented to the ED with complaints of fever and confusion as well as weakness.  Apparently he has been declining with worsening lethargy and poor oral intake over the last 3-4 weeks.  He presented to his oncology clinic yesterday with complaints of fatigue and dark urine and was diagnosed with potential UTI and was sent a prescription for ciprofloxacin to his pharmacy with urine cultures pending.  He was also noted to have new anemia and thrombocytopenia and given 1 unit of PRBC in the clinic.  He was recently admitted last month for hypotension and near syncope and was admitted for dehydration and AKI.  He appears to be active with palliative care and requires frequent narcotics for pain management.  His last chemotherapy was a month ago.   ED Course: Vital signs with noted fever of temperature 101.9 and tachycardia.  Sodium 133 and creatinine 1.3 which is near his baseline.  Hemoglobin is 9.3 and platelets are 40,000.  Lactic acid 0.9.  Urine analysis reviewed from yesterday with possible UTI and urine culture pending.  Blood cultures obtained in the ED are pending and he has been started on Rocephin and given IV fluid.  Review of Systems: Could not be obtained given patient condition.  Past Medical History:  Diagnosis Date   Cancer [facility])    bladder   Cataract    COVID-19 11/13/2019   h/a, chills, fatigue, loss of taste and smell, all symptoms resolved in a few weeks   Frequent urination    Fuchs' corneal dystrophy    Full dentures    GERD (gastroesophageal reflux disease)     Hypertension    Rheumatoid arthritis ([place])     Past Surgical History:  Procedure Laterality Date   CORNEAL TRANSPLANT Left 2013   CYSTOSCOPY WITH BIOPSY N/A 09/14/2019   Procedure: CYSTOSCOPY WITH BIOPSY;  Surgeon: Franchot Gallo, MD;  Location: [facility];  Service: Urology;  Laterality: N/A;   CYSTOSCOPY WITH INJECTION N/A 11/29/2020   Procedure: CYSTOSCOPY WITH INJECTION OF INDOCYANINE GREEN DYE;  Surgeon: Alexis Frock, MD;  Location: [facility];  Service: Urology;  Laterality: N/A;  6 HRS   FULGURATION OF BLADDER TUMOR N/A 09/14/2019   Procedure: FULGURATION OF BLADDER TUMOR;  Surgeon: Franchot Gallo, MD;  Location: [facility];  Service: Urology;  Laterality: N/A;   IR IMAGING GUIDED PORT INSERTION  06/07/2021   IR NEPHROSTOMY EXCHANGE LEFT  10/30/2020   IR NEPHROSTOMY EXCHANGE LEFT  11/19/2020   IR NEPHROSTOMY EXCHANGE RIGHT  10/30/2020   IR NEPHROSTOMY EXCHANGE RIGHT  11/19/2020   IR NEPHROSTOMY PLACEMENT LEFT  09/27/2020   IR NEPHROSTOMY PLACEMENT RIGHT  09/27/2020   ROBOT ASSISTED LAPAROSCOPIC COMPLETE CYSTECT ILEAL CONDUIT N/A 11/29/2020   Procedure: XI ROBOTIC ASSISTED LAPAROSCOPIC COMPLETE CYSTECT ILEAL CONDUIT/RADICAL PROSTATECTOMY/LYMPHADENECTOMY;  Surgeon: Alexis Frock, MD;  Location: [facility];  Service: Urology;  Laterality: N/A;   TRANSURETHRAL RESECTION OF BLADDER TUMOR WITH MITOMYCIN-C N/A 08/05/2019   Procedure: TRANSURETHRAL RESECTION OF BLADDER TUMOR;  Surgeon: Franchot Gallo, MD;  Location: [facility];  Service: Urology;  Laterality: N/A;  1 HR   TRANSURETHRAL RESECTION OF BLADDER TUMOR  WITH MITOMYCIN-C N/A 04/24/2020   Procedure: TRANSURETHRAL RESECTION OF BLADDER TUMOR WITH GEMCIDABINE;  Surgeon: Franchot Gallo, MD;  Location: [facility];  Service: Urology;  Laterality: N/A;     reports that he has quit smoking. His smoking use included cigarettes. He has a 10.00 pack-year smoking history. He has never used smokeless tobacco. He  reports current alcohol use. He reports that he does not use drugs.  Allergies  Allergen Reactions   Ace Inhibitors Cough   Diflucan [Fluconazole] Other (See Comments)    Irritated ulcers   Morphine Nausea And Vomiting    History reviewed. No pertinent family history.  Prior to Admission medications   Medication Sig Start Date End Date Taking? Authorizing Provider  acetaminophen (TYLENOL) 650 MG CR tablet Take 1,300 mg by mouth every morning.    [provider]  ARIPiprazole (ABILIFY) 5 MG tablet Take 1 tablet (5 mg total) by mouth daily. 12/19/22   Coral Spikes, DO  ciprofloxacin (CIPRO) 500 MG tablet Take 1 tablet (500 mg total) by mouth 2 (two) times daily for 5 days. 12/30/22 01/04/23  Walisiewicz, Verline Lema E, PA-C  dexamethasone (DECADRON) 4 MG tablet Take 1 tablet (4 mg total) by mouth 2 (two) times daily. Take for 3 days after each chemotherapy treatment 10/25/22   Wyatt Portela, MD  diazepam (VALIUM) 2 MG tablet Take 1 tablet (2 mg total) by mouth every 8 (eight) hours as needed for anxiety. 12/17/22   Pickenpack-Cousar, Carlena Sax, NP  diclofenac Sodium (VOLTAREN) 1 % GEL Apply 4 g topically 4 (four) times daily. Apply to knees 12/15/22   Shelly Coss, MD  gabapentin (NEURONTIN) 300 MG capsule Take 1 capsule (300 mg total) by mouth at bedtime. 12/24/22   Pickenpack-Cousar, Carlena Sax, NP  hydroxychloroquine (PLAQUENIL) 200 MG tablet Take 200 mg by mouth 2 (two) times daily. 08/09/22   [provider]  Lactulose 20 GM/30ML SOLN Take 30 mLs (20 g total) by mouth 2 (two) times daily as needed (take 2 times daily as needed for constipation). 12/25/22   Pickenpack-Cousar, Carlena Sax, NP  lidocaine (XYLOCAINE) 5 % ointment Apply 1 Application topically 3 (three) times daily as needed. Use no more than 6 inches of ointment per application [id number]   Samella Parr, NP  Multiple Vitamin (MULTIVITAMIN WITH MINERALS) TABS tablet Take 1 tablet by mouth daily. 09/18/21   Debbe Odea, MD   omeprazole (PRILOSEC) 40 MG capsule Take 40 mg by mouth daily.    [provider]  ondansetron (ZOFRAN-ODT) 4 MG disintegrating tablet Take 1 tablet (4 mg total) by mouth every 8 (eight) hours as needed for nausea or vomiting. 12/23/22   Truitt Merle, MD  oxyCODONE (ROXICODONE) 15 MG immediate release tablet Take 1 tablet (15 mg total) by mouth every 4 (four) hours as needed for severe pain. 12/19/22   Pickenpack-Cousar, Carlena Sax, NP  oxyCODONE 10 MG TABS Take 1 tablet (10 mg total) by mouth every 6 (six) hours as needed for breakthrough pain. 12/15/22   Shelly Coss, MD  prednisoLONE acetate (PRED FORTE) 1 % ophthalmic suspension Place 1 drop into the left eye 2 (two) times a week. Instill 1 drop into left eye Monday and Friday.    [provider]  predniSONE (DELTASONE) 10 MG tablet Take 1 tablet (10 mg total) by mouth daily. Take 4 pills daily for 3 days, 3 pills daily for 3 days, 2 pills daily for 3 days, 1 pill daily for 3 days then  stop 12/15/22   Shelly Coss, MD  predniSONE (DELTASONE) 5 MG tablet Take 4 tablets (20 mg total) by mouth daily as needed (flare up). Can resume after finishing the tapering dose 12/15/22   Shelly Coss, MD  promethazine (PHENERGAN) 25 MG tablet Take 1 tablet (25 mg total) by mouth every 6 (six) hours as needed for nausea or vomiting. 12/23/22   Truitt Merle, MD  rosuvastatin (CRESTOR) 20 MG tablet Take 1 tablet (20 mg total) by mouth daily. 12/02/22   Coral Spikes, DO  sertraline (ZOLOFT) 100 MG tablet Take 1 tablet (100 mg total) by mouth daily. 09/24/22   Coral Spikes, DO    Physical Exam: Vitals:   12/27/2022 0546 01/12/2023 0600 12/26/2022 0700 12/30/2022 0722  BP:  111/67 103/72   Pulse: (!) 116 (!) 114 (!) 116   Resp: (!) 24 19 20   $ Temp:    (!) 101.7 F (38.7 C)  TempSrc:    Axillary  SpO2: 95% 96% 96%     Constitutional: NAD, calm, comfortable Vitals:   12/24/2022 0546 01/04/2023 0600 12/30/2022 0700 01/12/2023 0722  BP:  111/67 103/72   Pulse:  (!) 116 (!) 114 (!) 116   Resp: (!) 24 19 20   $ Temp:    (!) 101.7 F (38.7 C)  TempSrc:    Axillary  SpO2: 95% 96% 96%    Eyes: lids and conjunctivae normal Neck: normal, supple Respiratory: clear to auscultation bilaterally. Normal respiratory effort. No accessory muscle use.  Currently on 3 L nasal cannula. Cardiovascular: Regular rate and rhythm, no murmurs. Abdomen: no tenderness, no distention. Bowel sounds positive.  Urostomy bag with clear, yellow urine output. Musculoskeletal:  No edema. Skin: no rashes, lesions, ulcers.  Psychiatric: Flat affect  Labs on Admission: I have personally reviewed following labs and imaging studies  CBC: Recent Labs  Lab 12/27/22 0929 12/30/22 1200 12/30/22 1347 01/14/2023 0535  WBC 4.7 5.9  --  7.1  NEUTROABS 2.9 3.5  --  5.1  HGB 8.1* 7.7*  --  9.3*  HCT 25.3* 23.5*  --  29.4*  MCV 91.3 89.4  --  90.2  PLT 72* 45* 46* 40*   Basic Metabolic Panel: Recent Labs  Lab 12/27/22 0929 12/30/22 1200 01/02/2023 0535  NA 138 136 133*  K 3.7 3.6 4.2  CL 107 104 103  CO2 24 23 20*  GLUCOSE 102* 88 85  BUN 26* 23* 25*  CREATININE 1.41* 1.24 1.30*  CALCIUM 8.3* 8.5* 8.3*   GFR: Estimated Creatinine Clearance: 64.4 mL/min (A) (by C-G formula based on SCr of 1.3 mg/dL (H)). Liver Function Tests: Recent Labs  Lab 12/27/22 0929 12/30/22 1200 01/06/2023 0535  AST 30 29 42*  ALT 8 8 11  $ ALKPHOS 385* 349* 333*  BILITOT 0.4 0.5 0.8  PROT 5.6* 5.6* 5.9*  ALBUMIN 3.2* 3.1* 2.8*   No results for input(s): "LIPASE", "AMYLASE" in the last 168 hours. No results for input(s): "AMMONIA" in the last 168 hours. Coagulation Profile: Recent Labs  Lab 12/30/22 1347 01/04/2023 0535  INR 1.2 1.2   Cardiac Enzymes: No results for input(s): "CKTOTAL", "CKMB", "CKMBINDEX", "TROPONINI" in the last 168 hours. BNP (last 3 results) No results for input(s): "PROBNP" in the last 8760 hours. HbA1C: No results for input(s): "HGBA1C" in the last 72  hours. CBG: No results for input(s): "GLUCAP" in the last 168 hours. Lipid Profile: No results for input(s): "CHOL", "HDL", "LDLCALC", "TRIG", "CHOLHDL", "LDLDIRECT" in the last 72 hours. Thyroid  Function Tests: No results for input(s): "TSH", "T4TOTAL", "FREET4", "T3FREE", "THYROIDAB" in the last 72 hours. Anemia Panel: No results for input(s): "VITAMINB12", "FOLATE", "FERRITIN", "TIBC", "IRON", "RETICCTPCT" in the last 72 hours. Urine analysis:    Component Value Date/Time   COLORURINE YELLOW 01/04/2023 0535   APPEARANCEUR CLOUDY (A) 01/07/2023 0535   APPEARANCEUR Hazy (A) 09/22/2020 1521   LABSPEC 1.011 12/29/2022 0535   PHURINE 5.0 01/06/2023 0535   GLUCOSEU NEGATIVE 12/22/2022 0535   HGBUR LARGE (A) 01/03/2023 0535   BILIRUBINUR NEGATIVE 01/06/2023 0535   BILIRUBINUR Negative 09/22/2020 1521   KETONESUR 5 (A) 12/28/2022 0535   PROTEINUR 100 (A) 12/20/2022 0535   UROBILINOGEN 0.2 06/09/2020 1536   NITRITE NEGATIVE 01/14/2023 0535   LEUKOCYTESUR LARGE (A) 12/26/2022 0535    Radiological Exams on Admission: DG Chest Port 1 View  Result Date: 12/28/2022 CLINICAL DATA:  Questionable sepsis EXAM: PORTABLE CHEST 1 VIEW COMPARISON:  12/13/2022 FINDINGS: Low volume chest. Hazy appearance of the bases. No suspected edema. No pneumothorax. Porta catheter on the right with tip at the right atrium. IMPRESSION: Hazy appearance at the bases, cannot exclude pneumonia in this clinical setting. Electronically Signed   By: Jorje Guild M.D.   On: 01/11/2023 06:09    EKG: Independently reviewed. ST 113bpm.  Assessment/Plan Principal Problem:   Sepsis ([place]) Active Problems:   Hyperlipidemia   Essential hypertension, benign   Rheumatoid arthritis ([place])   Bladder cancer (H[facility])   Port-A-Cath in place   CKD (chronic kidney disease) stage 3, GFR 30-59 ml/min (HCC)    SIRS criteria with possible UTI Started on Rocephin in the ED, continue same Monitor urine cultures Follow blood  cultures  History of hypertension Hold home amlodipine and losartan at this time given possible sepsis physiology Monitor closely  Dyslipidemia Continue statin  Metastatic bladder cancer With prior cystectomy and ileal conduit placement and mets to spine Currently on chemoradiation and follows with oncology On chronic pain regimen  Anemia/thrombocytopenia Continue to monitor throughout the stay Follows with hematology oncology regarding this  Severe back pain secondary to above Continues on narcotics, lidocaine patch, and Voltaren gel Prednisone  History of rheumatoid arthritis On hydroxychloroquine, hold for now   DVT prophylaxis: SCDs Code Status: DNR Family Communication: Wife at bedside Disposition Plan:Admit for UTI treatment Consults called:None Admission status: Inpatient, SDU  Severity of Illness: The appropriate patient status for this patient is INPATIENT. Inpatient status is judged to be reasonable and necessary in order to provide the required intensity of service to ensure the patient's safety. The patient's presenting symptoms, physical exam findings, and initial radiographic and laboratory data in the context of their chronic comorbidities is felt to place them at high risk for further clinical deterioration. Furthermore, it is not anticipated that the patient will be medically stable for discharge from the hospital within 2 midnights of admission.   * I certify that at the point of admission it is my clinical judgment that the patient will require inpatient hospital care spanning beyond 2 midnights from the point of admission due to high intensity of service, high risk for further deterioration and high frequency of surveillance required.*   Donica Derouin D Samarie Pinder DO [facility]  If 7PM-7AM, please contact night-coverage [email or web address]  01/14/2023, 7:35 AM

## 2022-12-31 NOTE — Progress Notes (Signed)
Pharmacy Antibiotic Note  Barry Gibson is a 62 y.o. male admitted on 12/27/2022 with sepsis/UTI.  Pharmacy has been consulted for Vancomycin and Cefepime  Plan: Cefepime 2 grams iv Q 8 hours Vancomycin 1750 mg iv Q 24 hours (AUC of 503 using Scr 1.3)  Follow up LOT, Scr, cultures  Height: 5' 11"$  (180.3 cm) Weight: 83.5 kg (184 lb) IBW/kg (Calculated) : 75.3  Temp (24hrs), Avg:101.1 F (38.4 C), Min:98.3 F (36.8 C), Max:103 F (39.4 C)  Recent Labs  Lab 12/27/22 0929 12/30/22 1200 12/20/2022 0535  WBC 4.7 5.9 7.1  CREATININE 1.41* 1.24 1.30*  LATICACIDVEN  --   --  0.9    Estimated Creatinine Clearance: 64.4 mL/min (A) (by C-G formula based on SCr of 1.3 mg/dL (H)).    Allergies  Allergen Reactions   Ace Inhibitors Cough   Diflucan [Fluconazole] Other (See Comments)    Irritated ulcers   Morphine Nausea And Vomiting    Thank you for allowing pharmacy to be a part of this patient's care.  [name] 01/10/2023 5:53 PM

## 2022-12-31 NOTE — ED Provider Notes (Signed)
[facility] Provider Note   CSN: [id number] Arrival date & time: 01/12/2023  0518     History  Chief Complaint  Patient presents with   Fever    Barry Gibson is a 62 y.o. male.  The history is provided by the EMS personnel. The history is limited by the condition of the patient (Altered mental status).  Fever He has history of hypertension, rheumatoid arthritis, GERD, metastatic bladder cancer and is brought in by ambulance because of weakness and confusion with fever.  Patient is not able to give any relevant history.  He was started on ciprofloxacin yesterday, at the cancer center.   Home Medications Prior to Admission medications   Medication Sig Start Date End Date Taking? Authorizing Provider  acetaminophen (TYLENOL) 650 MG CR tablet Take 1,300 mg by mouth every morning.    [provider]  ARIPiprazole (ABILIFY) 5 MG tablet Take 1 tablet (5 mg total) by mouth daily. 12/19/22   Coral Spikes, DO  ciprofloxacin (CIPRO) 500 MG tablet Take 1 tablet (500 mg total) by mouth 2 (two) times daily for 5 days. 12/30/22 01/04/23  Walisiewicz, Verline Lema E, PA-C  dexamethasone (DECADRON) 4 MG tablet Take 1 tablet (4 mg total) by mouth 2 (two) times daily. Take for 3 days after each chemotherapy treatment 10/25/22   Wyatt Portela, MD  diazepam (VALIUM) 2 MG tablet Take 1 tablet (2 mg total) by mouth every 8 (eight) hours as needed for anxiety. 12/17/22   Pickenpack-Cousar, Carlena Sax, NP  diclofenac Sodium (VOLTAREN) 1 % GEL Apply 4 g topically 4 (four) times daily. Apply to knees 12/15/22   Shelly Coss, MD  gabapentin (NEURONTIN) 300 MG capsule Take 1 capsule (300 mg total) by mouth at bedtime. 12/24/22   Pickenpack-Cousar, Carlena Sax, NP  hydroxychloroquine (PLAQUENIL) 200 MG tablet Take 200 mg by mouth 2 (two) times daily. 08/09/22   [provider]  Lactulose 20 GM/30ML SOLN Take 30 mLs (20 g total) by mouth 2 (two) times daily as  needed (take 2 times daily as needed for constipation). 12/25/22   Pickenpack-Cousar, Carlena Sax, NP  lidocaine (XYLOCAINE) 5 % ointment Apply 1 Application topically 3 (three) times daily as needed. Use no more than 6 inches of ointment per application [id number]   Samella Parr, NP  Multiple Vitamin (MULTIVITAMIN WITH MINERALS) TABS tablet Take 1 tablet by mouth daily. 09/18/21   Debbe Odea, MD  omeprazole (PRILOSEC) 40 MG capsule Take 40 mg by mouth daily.    [provider]  ondansetron (ZOFRAN-ODT) 4 MG disintegrating tablet Take 1 tablet (4 mg total) by mouth every 8 (eight) hours as needed for nausea or vomiting. 12/23/22   Truitt Merle, MD  oxyCODONE (ROXICODONE) 15 MG immediate release tablet Take 1 tablet (15 mg total) by mouth every 4 (four) hours as needed for severe pain. 12/19/22   Pickenpack-Cousar, Carlena Sax, NP  oxyCODONE 10 MG TABS Take 1 tablet (10 mg total) by mouth every 6 (six) hours as needed for breakthrough pain. 12/15/22   Shelly Coss, MD  prednisoLONE acetate (PRED FORTE) 1 % ophthalmic suspension Place 1 drop into the left eye 2 (two) times a week. Instill 1 drop into left eye Monday and Friday.    [provider]  predniSONE (DELTASONE) 10 MG tablet Take 1 tablet (10 mg total) by mouth daily. Take 4 pills daily for 3 days, 3 pills daily for 3 days, 2 pills daily for  3 days, 1 pill daily for 3 days then stop 12/15/22   Shelly Coss, MD  predniSONE (DELTASONE) 5 MG tablet Take 4 tablets (20 mg total) by mouth daily as needed (flare up). Can resume after finishing the tapering dose 12/15/22   Shelly Coss, MD  promethazine (PHENERGAN) 25 MG tablet Take 1 tablet (25 mg total) by mouth every 6 (six) hours as needed for nausea or vomiting. 12/23/22   Truitt Merle, MD  rosuvastatin (CRESTOR) 20 MG tablet Take 1 tablet (20 mg total) by mouth daily. 12/02/22   Coral Spikes, DO  sertraline (ZOLOFT) 100 MG tablet Take 1 tablet (100 mg total) by mouth daily. 09/24/22   Coral Spikes, DO      Allergies    Ace inhibitors, Diflucan [fluconazole], and Morphine    Review of Systems   Review of Systems  Unable to perform ROS: Mental status change  Constitutional:  Positive for fever.    Physical Exam Updated Vital Signs BP 123/76   Pulse (!) 116   Temp (!) 101.9 F (38.8 C) (Rectal)   SpO2 94%  Physical Exam Vitals and nursing note reviewed.   62 year old male, resting comfortably and in no acute distress. Vital signs are significant for elevated temperature and heart rate. Oxygen saturation is 94%, which is normal. Head is normocephalic and atraumatic. PERRLA, EOMI. Oropharynx is clear. Neck is nontender and supple without adenopathy or JVD. Back is nontender and there is no CVA tenderness. Lungs are clear without rales, wheezes, or rhonchi. Chest is nontender. Heart has regular rate and rhythm without murmur. Abdomen is soft, flat, nontender.  Ileal conduit present right lower quadrant draining clear urine. Extremities have no cyanosis or edema, full range of motion is present. Skin is warm and dry without rash. Neurologic: Awake and alert, oriented to person but not place or time.  Cranial nerves are intact.  Moves all extremities equally.  ED Results / Procedures / Treatments   Labs (all labs ordered are listed, but only abnormal results are displayed) Labs Reviewed  COMPREHENSIVE METABOLIC PANEL - Abnormal; Notable for the following components:      Result Value   Sodium 133 (*)    CO2 20 (*)    BUN 25 (*)    Creatinine, Ser 1.30 (*)    Calcium 8.3 (*)    Total Protein 5.9 (*)    Albumin 2.8 (*)    AST 42 (*)    Alkaline Phosphatase 333 (*)    All other components within normal limits  CBC WITH DIFFERENTIAL/PLATELET - Abnormal; Notable for the following components:   RBC 3.26 (*)    Hemoglobin 9.3 (*)    HCT 29.4 (*)    RDW 17.3 (*)    Platelets 40 (*)    nRBC 5.0 (*)    nRBC 2 (*)    Abs Immature Granulocytes 0.40 (*)    All  other components within normal limits  URINALYSIS, W/ REFLEX TO CULTURE (INFECTION SUSPECTED) - Abnormal; Notable for the following components:   APPearance CLOUDY (*)    Hgb urine dipstick LARGE (*)    Ketones, ur 5 (*)    Protein, ur 100 (*)    Leukocytes,Ua LARGE (*)    Bacteria, UA RARE (*)    All other components within normal limits  RESP PANEL BY RT-PCR (RSV, FLU A&B, COVID)  [id number]  CULTURE, BLOOD (ROUTINE X 2)  CULTURE, BLOOD (ROUTINE X 2)  URINE CULTURE  LACTIC  ACID, PLASMA  PROTIME-INR  APTT    EKG EKG Interpretation  Date/Time:  Tuesday December 31 2022 05:33:43 EST Ventricular Rate:  116 PR Interval:  140 QRS Duration: 86 QT Interval:  334 QTC Calculation: 464 R Axis:   49 Text Interpretation: Sinus tachycardia Low voltage, extremity leads Abnormal R-wave progression, early transition Borderline repolarization abnormality When compared with ECG of 09/16/2022, HEART RATE has increased REPOLARIZATION ABNORMALITY is slightly more prominent Confirmed by Delora Fuel ([id number]) on 01/14/2023 5:41:31 AM  Radiology DG Chest Port 1 View  Result Date: 12/23/2022 CLINICAL DATA:  Questionable sepsis EXAM: PORTABLE CHEST 1 VIEW COMPARISON:  12/13/2022 FINDINGS: Low volume chest. Hazy appearance of the bases. No suspected edema. No pneumothorax. Porta catheter on the right with tip at the right atrium. IMPRESSION: Hazy appearance at the bases, cannot exclude pneumonia in this clinical setting. Electronically Signed   By: Jorje Guild M.D.   On: 01/14/2023 06:09    Procedures Procedures  Cardiac monitor shows sinus tachycardia, per my interpretation.  Medications Ordered in ED Medications  lactated ringers bolus 1,000 mL (has no administration in time range)  lactated ringers infusion (has no administration in time range)  acetaminophen (TYLENOL) tablet 650 mg (has no administration in time range)    ED Course/ Medical Decision Making/ A&P                              Medical Decision Making Amount and/or Complexity of Data Reviewed Labs: ordered. Radiology: ordered. ECG/medicine tests: ordered.  Risk OTC drugs. Prescription drug management. Decision regarding hospitalization.   Fever with confusion concerning for sepsis.  Source is not clear.  I reviewed his past records, and he was seen at the cancer center yesterday and started on ciprofloxacin because of concern for UTI.  Urinalysis at that time showed greater than 50 WBCs and greater than 50 RBCs with negative nitrite.  Culture was sent but initial results are still pending.  Yesterday WBC was 5.9, hemoglobin 7.7, platelets 45,000, electrolytes and creatinine normal with BUN slightly elevated at 23.  I have ordered the evolving sepsis pathway.  I have reviewed and interpreted his electrocardiogram and my interpretation is sinus tachycardia with borderline repolarization changes which are slightly more pronounced than on previous electrocardiogram.  I have ordered an initial bolus of lactated Ringer's solution as well as initial dose of acetaminophen for fever.  On review of office records from yesterday, he was also given 1 unit of blood.  Chest x-ray shows hazy appearance of the bases radiologist states cannot exclude pneumonia in this clinical setting.  I have reviewed the image, and do not feel that the x-ray appearance is that of pneumonia.  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  Lactic acid level  is normal.  He continues to be hemodynamically stable, no evidence of severe sepsis or septic shock.  His wife has arrived and states that he has had frequent urinary tract infections and has never had confusion like this.  Respiratory pathogen panel is  negative for influenza, RSV, COVID-19.  Urinary tract infection seems to be the source of infection and I have ordered a dose of ceftriaxone.  I discussed the case with Dr. Manuella Ghazi of [facility], who agrees to admit the patient.  CRITICAL CARE Performed by: Delora Fuel Total critical care time: 55 minutes Critical care time was exclusive of separately billable procedures and treating other patients. Critical care was necessary to treat or prevent imminent or life-threatening deterioration. Critical care was time spent personally by me on the following activities: development of treatment plan with patient and/or surrogate as well as nursing, discussions with consultants, evaluation of patient's response to treatment, examination of patient, obtaining history from patient or surrogate, ordering and performing treatments and interventions, ordering and review of laboratory studies, ordering and review of radiographic studies, pulse oximetry and re-evaluation of patient's condition.  Final Clinical Impression(s) / ED Diagnoses Final diagnoses:  Urinary tract infection with hematuria, site unspecified  Confusion  Anemia, unspecified type  Thrombocytopenia (HCC)  Renal insufficiency  Hyponatremia  Elevated alkaline phosphatase level  Elevated AST (SGOT)    Rx / DC Orders ED Discharge Orders     None         Delora Fuel, MD [id number]

## 2022-12-31 NOTE — ED Triage Notes (Signed)
Pt presents with fever and confusion. Pt does have bladder cancer. Reports of weakness. Undergoing chemo and radiation.

## 2023-01-01 ENCOUNTER — Inpatient Hospital Stay (HOSPITAL_COMMUNITY): Payer: BC Managed Care – PPO

## 2023-01-01 ENCOUNTER — Inpatient Hospital Stay: Payer: BC Managed Care – PPO | Admitting: Physician Assistant

## 2023-01-01 DIAGNOSIS — D649 Anemia, unspecified: Secondary | ICD-10-CM | POA: Diagnosis not present

## 2023-01-01 DIAGNOSIS — R748 Abnormal levels of other serum enzymes: Secondary | ICD-10-CM

## 2023-01-01 DIAGNOSIS — A419 Sepsis, unspecified organism: Secondary | ICD-10-CM | POA: Diagnosis not present

## 2023-01-01 LAB — URINE CULTURE
Culture: >=100000 — AB
Culture: <10000 — AB

## 2023-01-01 LAB — COMPREHENSIVE METABOLIC PANEL
ALT: 10 U/L (ref 0–44)
AST: 34 U/L (ref 15–41)
Albumin: 2.4 g/dL — ABNORMAL LOW (ref 3.5–5.0)
Alkaline Phosphatase: 261 U/L — ABNORMAL HIGH (ref 38–126)
Anion gap: 11 (ref 5–15)
BUN: 27 mg/dL — ABNORMAL HIGH (ref 6–20)
CO2: 18 mmol/L — ABNORMAL LOW (ref 22–32)
Calcium: 8.1 mg/dL — ABNORMAL LOW (ref 8.9–10.3)
Chloride: 104 mmol/L (ref 98–111)
Creatinine, Ser: 1.33 mg/dL — ABNORMAL HIGH (ref 0.61–1.24)
GFR, Estimated: >60 mL/min (ref >60)
Glucose, Bld: 75 mg/dL (ref 70–99)
Potassium: 4.7 mmol/L (ref 3.5–5.1)
Sodium: 133 mmol/L — ABNORMAL LOW (ref 135–145)
Total Bilirubin: 1.2 mg/dL (ref 0.3–1.2)
Total Protein: 5.2 g/dL — ABNORMAL LOW (ref 6.5–8.1)

## 2023-01-01 LAB — HEMOGLOBIN AND HEMATOCRIT, BLOOD
HCT: 25.5 % — ABNORMAL LOW (ref 39.0–52.0)
HCT: 25.6 % — ABNORMAL LOW (ref 39.0–52.0)
Hemoglobin: 8.2 g/dL — ABNORMAL LOW (ref 13.0–17.0)
Hemoglobin: 8.3 g/dL — ABNORMAL LOW (ref 13.0–17.0)

## 2023-01-01 LAB — CBC
HCT: 27.3 % — ABNORMAL LOW (ref 39.0–52.0)
Hemoglobin: 8.8 g/dL — ABNORMAL LOW (ref 13.0–17.0)
MCH: 29.1 pg (ref 26.0–34.0)
MCHC: 32.2 g/dL (ref 30.0–36.0)
MCV: 90.4 fL (ref 80.0–100.0)
Platelets: 39 10*3/uL — ABNORMAL LOW (ref 150–400)
RBC: 3.02 MIL/uL — ABNORMAL LOW (ref 4.22–5.81)
RDW: 16.5 % — ABNORMAL HIGH (ref 11.5–15.5)
WBC: 11.1 10*3/uL — ABNORMAL HIGH (ref 4.0–10.5)
nRBC: 3.4 % — ABNORMAL HIGH (ref 0.0–0.2)

## 2023-01-01 LAB — MAGNESIUM: Magnesium: 1.4 mg/dL — ABNORMAL LOW (ref 1.7–2.4)

## 2023-01-01 LAB — OCCULT BLOOD X 1 CARD TO LAB, STOOL: Fecal Occult Bld: POSITIVE — AB

## 2023-01-01 LAB — GAMMA GT: GGT: 16 U/L (ref 7–50)

## 2023-01-01 MED ORDER — ACETAMINOPHEN 160 MG/5ML PO SOLN
650.0000 mg | Freq: Four times a day (QID) | ORAL | Status: DC | PRN
  Administered 2023-01-01: 650 mg via ORAL
  Filled 2023-01-01: qty 20.3

## 2023-01-01 MED ORDER — DEXTROSE-NACL 5-0.9 % IV SOLN: INTRAVENOUS | Status: DC

## 2023-01-01 MED ORDER — NOREPINEPHRINE 4 MG/250ML-% IV SOLN: 0.0000 ug/min | INTRAVENOUS | Status: DC

## 2023-01-01 MED ORDER — SODIUM CHLORIDE 0.9 % IV SOLN
2.0000 g | INTRAVENOUS | Status: DC
  Administered 2023-01-01 – 2023-01-04 (×4): 2 g via INTRAVENOUS
  Filled 2023-01-01 (×4): qty 20

## 2023-01-01 MED ORDER — MAGNESIUM SULFATE 2 GM/50ML IV SOLN
2.0000 g | Freq: Once | INTRAVENOUS | Status: AC
  Administered 2023-01-01: 2 g via INTRAVENOUS
  Filled 2023-01-01: qty 50

## 2023-01-01 MED ORDER — PANTOPRAZOLE SODIUM 40 MG IV SOLR
40.0000 mg | Freq: Two times a day (BID) | INTRAVENOUS | Status: DC
  Administered 2023-01-01 – 2023-01-04 (×8): 40 mg via INTRAVENOUS
  Filled 2023-01-01 (×8): qty 10

## 2023-01-01 MED ORDER — HYDROCORTISONE SOD SUC (PF) 100 MG IJ SOLR
100.0000 mg | Freq: Three times a day (TID) | INTRAMUSCULAR | Status: DC
  Administered 2023-01-01 – 2023-01-02 (×5): 100 mg via INTRAVENOUS
  Filled 2023-01-01 (×5): qty 2

## 2023-01-01 NOTE — Progress Notes (Signed)
PROGRESS NOTE    Barry Gibson  [id number] DOB: 1960-12-25 DOA: 01/14/2023 PCP: Coral Spikes, DO   Brief Narrative:    Barry Gibson is a 63 y.o. male with medical history significant for hypertension, hyperlipidemia, rheumatoid arthritis, metastatic bladder cancer status post chemo/radiation, and cystectomy and ileal conduit placement who presented to the ED with complaints of fever and confusion as well as weakness.  Apparently he has been declining with worsening lethargy and poor oral intake over the last 3-4 weeks.  Patient was admitted for sepsis secondary to Serratia UTI.  He continues to have ongoing fevers and altered mentation.  Assessment & Plan:   Principal Problem:   Sepsis ([place]) Active Problems:   Hyperlipidemia   Essential hypertension, benign   Rheumatoid arthritis (HCC)   Bladder cancer (HCC)   Port-A-Cath in place   CKD (chronic kidney disease) stage 3, GFR 30-59 ml/min (HCC)  Assessment and Plan:  Sepsis, POA, secondary to Serratia UTI Continue back on Rocephin and discontinue cefepime and vancomycin given sensitivities Blood cultures with no growth to date  Acute metabolic encephalopathy secondary to above Check head CT today to ensure no other findings   History of hypertension Currently with soft blood pressures, hold home amlodipine and losartan Monitor closely  Hypomagnesemia Replete and reevaluate in a.m.   Dyslipidemia Continue statin   Metastatic bladder cancer With prior cystectomy and ileal conduit placement and mets to spine Currently on chemoradiation and follows with oncology On chronic pain regimen   Anemia/thrombocytopenia Continue to monitor throughout the stay Follows with hematology oncology regarding this   Severe back pain secondary to above Continues on narcotics, lidocaine patch, and Voltaren gel Prednisone   History of rheumatoid arthritis On hydroxychloroquine, hold for now   DVT prophylaxis:SCDs Code  Status: DNR Family Communication: Wife at bedside 2/14 Disposition Plan:  Status is: Inpatient Remains inpatient appropriate because: Need for IV medications.   Consultants:  None  Procedures:  None  Antimicrobials:  Anti-infectives (From admission, onward)    Start     Dose/Rate Route Frequency Ordered Stop   01/01/23 1000  cefTRIAXone (ROCEPHIN) 2 g in sodium chloride 0.9 % 100 mL IVPB        2 g 200 mL/hr over 30 Minutes Intravenous Every 24 hours 01/01/23 0816     01/01/23 0700  cefTRIAXone (ROCEPHIN) 2 g in sodium chloride 0.9 % 100 mL IVPB  Status:  Discontinued        2 g 200 mL/hr over 30 Minutes Intravenous Every 24 hours 01/01/2023 0800 01/07/2023 1747   01/06/2023 1845  vancomycin (VANCOREADY) IVPB 1500 mg/300 mL  Status:  Discontinued        1,500 mg 150 mL/hr over 120 Minutes Intravenous  Once 01/09/2023 1750 12/28/2022 1752   12/30/2022 1845  vancomycin (VANCOREADY) IVPB 1750 mg/350 mL  Status:  Discontinued        1,750 mg 175 mL/hr over 120 Minutes Intravenous Every 24 hours 01/11/2023 1752 01/01/23 0816   12/30/2022 1750  ceFEPIme (MAXIPIME) 2 g in sodium chloride 0.9 % 100 mL IVPB  Status:  Discontinued        2 g 200 mL/hr over 30 Minutes Intravenous Every 8 hours 01/06/2023 1750 01/01/23 0816   01/09/2023 0715  cefTRIAXone (ROCEPHIN) 2 g in sodium chloride 0.9 % 100 mL IVPB        2 g 200 mL/hr over 30 Minutes Intravenous  Once 12/21/2022 0700 01/04/2023 0810  Subjective: Patient seen and evaluated today and is somnolent and difficult to arouse.  Noted to have ongoing fever overnight.  No other acute events noted.  Urine output per urostomy appears to be darker.  Objective: Vitals:   01/01/23 0700 01/01/23 0730 01/01/23 0800 01/01/23 0900  BP: (!) 91/51     Pulse: (!) 120  (!) 117 (!) 120  Resp: 19  (!) 23 20  Temp:  (!) 100.5 F (38.1 C)    TempSrc:  Rectal    SpO2: 94%  93% 95%  Weight:      Height:        Intake/Output Summary (Last 24 hours) at 01/01/2023  1112 Last data filed at 01/01/2023 0955 Gross per 24 hour  Intake 2519.37 ml  Output 1725 ml  Net 794.37 ml   Filed Weights   12/22/2022 0846  Weight: 83.5 kg    Examination:  General exam: Appears somnolent and difficult to arouse Respiratory system: Clear to auscultation. Respiratory effort normal.  Nasal cannula oxygen. Cardiovascular system: S1 & S2 heard, RRR.  Gastrointestinal system: Abdomen is soft.  Urostomy with dark urine output Central nervous system: Somnolent Extremities: No edema Skin: No significant lesions noted    Data Reviewed: I have personally reviewed following labs and imaging studies  CBC: Recent Labs  Lab 12/27/22 0929 12/30/22 1200 12/30/22 1347 12/24/2022 0535 01/01/23 0435  WBC 4.7 5.9  --  7.1 11.1*  NEUTROABS 2.9 3.5  --  5.1  --   HGB 8.1* 7.7*  --  9.3* 8.8*  HCT 25.3* 23.5*  --  29.4* 27.3*  MCV 91.3 89.4  --  90.2 90.4  PLT 72* 45* 46* 40* 39*   Basic Metabolic Panel: Recent Labs  Lab 12/27/22 0929 12/30/22 1200 01/06/2023 0535 01/01/23 0435  NA 138 136 133* 133*  K 3.7 3.6 4.2 4.7  CL 107 104 103 104  CO2 24 23 20* 18*  GLUCOSE 102* 88 85 75  BUN 26* 23* 25* 27*  CREATININE 1.41* 1.24 1.30* 1.33*  CALCIUM 8.3* 8.5* 8.3* 8.1*  MG  --   --   --  1.4*   GFR: Estimated Creatinine Clearance: 62.9 mL/min (A) (by C-G formula based on SCr of 1.33 mg/dL (H)). Liver Function Tests: Recent Labs  Lab 12/27/22 0929 12/30/22 1200 12/20/2022 0535 01/01/23 0435  AST 30 29 42* 34  ALT 8 8 11 10  $ ALKPHOS 385* 349* 333* 261*  BILITOT 0.4 0.5 0.8 1.2  PROT 5.6* 5.6* 5.9* 5.2*  ALBUMIN 3.2* 3.1* 2.8* 2.4*   No results for input(s): "LIPASE", "AMYLASE" in the last 168 hours. No results for input(s): "AMMONIA" in the last 168 hours. Coagulation Profile: Recent Labs  Lab 12/30/22 1347 01/09/2023 0535  INR 1.2 1.2   Cardiac Enzymes: No results for input(s): "CKTOTAL", "CKMB", "CKMBINDEX", "TROPONINI" in the last 168 hours. BNP (last  3 results) No results for input(s): "PROBNP" in the last 8760 hours. HbA1C: No results for input(s): "HGBA1C" in the last 72 hours. CBG: No results for input(s): "GLUCAP" in the last 168 hours. Lipid Profile: No results for input(s): "CHOL", "HDL", "LDLCALC", "TRIG", "CHOLHDL", "LDLDIRECT" in the last 72 hours. Thyroid Function Tests: No results for input(s): "TSH", "T4TOTAL", "FREET4", "T3FREE", "THYROIDAB" in the last 72 hours. Anemia Panel: No results for input(s): "VITAMINB12", "FOLATE", "FERRITIN", "TIBC", "IRON", "RETICCTPCT" in the last 72 hours. Sepsis Labs: Recent Labs  Lab 12/26/2022 0535 01/08/2023 1806  LATICACIDVEN 0.9 0.9    Recent Results (from  the past 240 hour(s))  Urine Culture     Status: Abnormal   Collection Time: 12/30/22 12:00 PM   Specimen: Urine, Clean Catch  Result Value Ref Range Status   Specimen Description   Final    URINE, CLEAN CATCH Performed at [facility], [place] [address], [place], [place] [address]    Special Requests   Final    URINE, CLEAN CATCH Performed at [facility], [place] [address], [place], [place] [address]    Culture >=100,000 COLONIES/mL SERRATIA MARCESCENS (A)  Final   Report Status 01/01/2023 FINAL  Final   Organism ID, Bacteria SERRATIA MARCESCENS (A)  Final      Susceptibility   Serratia marcescens - MIC*    CEFEPIME <=0.12 SENSITIVE Sensitive     CEFTRIAXONE <=0.25 SENSITIVE Sensitive     CIPROFLOXACIN <=0.25 SENSITIVE Sensitive     GENTAMICIN <=1 SENSITIVE Sensitive     NITROFURANTOIN 128 RESISTANT Resistant     TRIMETH/SULFA <=20 SENSITIVE Sensitive     * >=100,000 COLONIES/mL SERRATIA MARCESCENS  Blood Culture (routine x 2)     Status: None (Preliminary result)   Collection Time: 01/01/2023  5:35 AM   Specimen: BLOOD  Result Value Ref Range Status   Specimen Description BLOOD RIGHT ASSIST CONTROL  Final   Special Requests   Final    BOTTLES DRAWN AEROBIC AND ANAEROBIC  Blood Culture results may not be optimal due to an excessive volume of blood received in culture bottles   Culture   Final    NO GROWTH 1 DAY Performed at [facility], [address] [place], [place] [address]    Report Status PENDING  Incomplete  Urine Culture     Status: Abnormal   Collection Time: 12/28/2022  5:35 AM   Specimen: Urine, Random  Result Value Ref Range Status   Specimen Description   Final    URINE, RANDOM Performed at [facility], [address], [place], [place] [address]    Special Requests   Final    NONE Reflexed from [id number] Performed at [facility] : [facility], [address] [place],  [address]    Culture (A)  Final    <10,000 COLONIES/mL INSIGNIFICANT GROWTH Performed at [facility], [place] [address] [place],  [address]    Report Status 01/01/2023 FINAL  Final  Resp panel by RT-PCR (RSV, Flu A&B, Covid) Anterior Nasal Swab     Status: None   Collection Time: 12/27/2022  5:38 AM   Specimen: Anterior Nasal Swab  Result Value Ref Range Status   SARS Coronavirus 2 by RT PCR NEGATIVE NEGATIVE Final    Comment: (NOTE) SARS-CoV-2 target nucleic acids are NOT DETECTED.  The SARS-CoV-2 RNA is generally detectable in upper respiratory specimens during the acute phase of infection. The lowest concentration of SARS-CoV-2 viral copies this assay can detect is 138 copies/mL. A negative result does not preclude SARS-Cov-2 infection and should not be used as the sole basis for treatment or other patient management decisions. A negative result may occur with  improper specimen collection/handling, submission of specimen other than nasopharyngeal swab, presence of viral mutation(s) within the areas targeted by this assay, and inadequate number of viral copies(<138 copies/mL). A negative result must be combined with clinical observations, patient history, and epidemiological information. The expected result is Negative.  Fact Sheet for Patients:   EntrepreneurPulse.com.au  Fact Sheet for Healthcare Providers:  IncredibleEmployment.be  This test is no t yet approved or  cleared by the [place] and  has been authorized for detection and/or diagnosis of SARS-CoV-2 by FDA under an Emergency Use Authorization (EUA). This EUA will remain  in effect (meaning this test can be used) for the duration of the COVID-19 declaration under Section 564(b)(1) of the Act, 21 U.S.C.section 360bbb-3(b)(1), unless the authorization is terminated  or revoked sooner.       Influenza A by PCR NEGATIVE NEGATIVE Final   Influenza B by PCR NEGATIVE NEGATIVE Final    Comment: (NOTE) The Xpert Xpress SARS-CoV-2/FLU/RSV plus assay is intended as an aid in the diagnosis of influenza from Nasopharyngeal swab specimens and should not be used as a sole basis for treatment. Nasal washings and aspirates are unacceptable for Xpert Xpress SARS-CoV-2/FLU/RSV testing.  Fact Sheet for Patients: EntrepreneurPulse.com.au  Fact Sheet for Healthcare Providers: IncredibleEmployment.be  This test is not yet approved or cleared by the [place] FDA and has been authorized for detection and/or diagnosis of SARS-CoV-2 by FDA under an Emergency Use Authorization (EUA). This EUA will remain in effect (meaning this test can be used) for the duration of the COVID-19 declaration under Section 564(b)(1) of the Act, 21 U.S.C. section 360bbb-3(b)(1), unless the authorization is terminated or revoked.     Resp Syncytial Virus by PCR NEGATIVE NEGATIVE Final    Comment: (NOTE) Fact Sheet for Patients: EntrepreneurPulse.com.au  Fact Sheet for Healthcare Providers: IncredibleEmployment.be  This test is not yet approved or cleared by the [place] FDA and has been authorized for detection and/or diagnosis of SARS-CoV-2 by FDA under an Emergency Use  Authorization (EUA). This EUA will remain in effect (meaning this test can be used) for the duration of the COVID-19 declaration under Section 564(b)(1) of the Act, 21 U.S.C. section 360bbb-3(b)(1), unless the authorization is terminated or revoked.  Performed at [facility], [address], [place], [place] [address]   Blood Culture (routine x 2)     Status: None (Preliminary result)   Collection Time: 01/02/2023  5:57 AM   Specimen: BLOOD  Result Value Ref Range Status   Specimen Description BLOOD port  Final   Special Requests   Final    BOTTLES DRAWN AEROBIC AND ANAEROBIC Blood Culture results may not be optimal due to an excessive volume of blood received in culture bottles   Culture   Final    NO GROWTH < 24 HOURS Performed at [facility], [address] [place], [place] [address]    Report Status PENDING  Incomplete  MRSA Next Gen by PCR, Nasal     Status: None   Collection Time: 01/11/2023  8:50 AM   Specimen: Nasal Mucosa; Nasal Swab  Result Value Ref Range Status   MRSA by PCR Next Gen NOT DETECTED NOT DETECTED Final    Comment: (NOTE) The GeneXpert MRSA Assay (FDA approved for NASAL specimens only), is one component of a comprehensive MRSA colonization surveillance program. It is not intended to diagnose MRSA infection nor to guide or monitor treatment for MRSA infections. Test performance is not FDA approved in patients less than 50 years old. Performed at [facility], [address] [place], [place] [address]          Radiology Studies: [facility] 1 View  Result Date: 12/19/2022 CLINICAL DATA:  Questionable sepsis EXAM: PORTABLE CHEST 1 VIEW COMPARISON:  12/13/2022 FINDINGS: Low volume chest. Hazy appearance of the bases. No suspected edema. No pneumothorax. Porta catheter on the right with tip at the right atrium.  IMPRESSION: Hazy appearance at the bases, cannot exclude pneumonia in this clinical setting. Electronically Signed   By: Jorje Guild M.D.   On:  01/15/2023 06:09        Scheduled Meds:  ARIPiprazole  5 mg Oral Daily   Chlorhexidine Gluconate Cloth  6 each Topical Q0600   gabapentin  300 mg Oral QHS   Continuous Infusions:  cefTRIAXone (ROCEPHIN)  IV     dextrose 5 % and 0.9% NaCl       LOS: 1 day    Time spent: 35 minutes    Landers Prajapati Darleen Crocker, DO [facility]  If 7PM-7AM, please contact night-coverage [email or web address] 01/01/2023, 11:12 AM

## 2023-01-01 NOTE — Progress Notes (Signed)
Medical Oncology brief Note  Pt is known to me, recently transferred to his oncology care from Dr. Alen Blew to me.  He was admitted to any pain hospital for UTI and sepsis, he is a critically ill, with persistent fever, altered mental status with minimal responsiveness, and borderline hypotension.  I have reviewed his chart, and spoke with his hospital attending Dr. Manuella Ghazi about his condition.  Patient fortunately has metastatic bladder cancer, which is incurable, and he has had disease progression lately.  Metastatic cancer can also cause tumor fever. He has DIC from sepsis also, with thrombocytopenia. I am not sure if he can turn around this time, but I totally agree with full medical treatment for now. Patient is DNR. If he does not improved after a prolong course of medical treatment including antibiotics, it's reasonable to transition to comfort care.   I called the patient's wife Barry Gibson, and reviewed the above.  She is very appreciative about our care and the call. I assured her that he is under good care at [facility] hospital. I expressed our support to her and her family.   Barry Merle MD  01/01/2023

## 2023-01-01 NOTE — Consult Note (Signed)
Gastroenterology Consult   Referring Provider: No ref. provider found Primary Care Physician:  Coral Spikes, DO Primary Gastroenterologist:  Dr.  Patient ID: Barry Gibson; [name]; [birth year]   Admit date: 12/22/2022  LOS: 1 day   Date of Consultation: 01/01/2023  Reason for Consultation:  ANEMIA/HEME POSITIVE STOOL  History of Present Illness   Barry Gibson is a 62 y.o. year old male with medical history significant for HTN, hyperlipidemia, RA metastatic bladder cancer status post chemo/radiation, and cystectomy and ileal conduit placement who presented to the ED with c/o fever and confusion as well as weakness. appears to have been declining with worsening lethargy and poor oral intake over the last 3-4 weeks. admitted for sepsis secondary to UTI. Continued anemia throughout admission with positive FOBT, also with elevated Alk Phos, GI consulted for further evaluation.  Hgb 8.8, has been up and down in the 7-9 range over the past few weeks.  Chronic thrombocytopenia, plt count 39k   Alk phos has also been elevated the past few weeks, 503 on 1/30, down to 261 today. Slight rise in AST to 42 yesterday but now other LFTs WNL.  Consult:  Patient's daughter Barry Gibson at bedside. Pt unable to provide any history due to metabolic encephalopathy. He is obtunded and only minimally responsive to painful stimuli.   History obtained from his daughter Barry Gibson at bedside and wife Barry Gibson via telephone. Per chart review, large gastric ulcer in 2020, followed by Dr. Cindee Salt at [place]. Maintained on omeprazole which he has taken compliantly per his wife. Has been taking ibuprofen  usually 641m BID- TID, between his doses of oxycodone as he was trying to avoid taking opiates as much for chronic pain. Wife denies any mention from patient of melena or rectal bleeding. Does not he has had minimal appetite recently, will take only a few bites of a meal. Has also had ongoing nausea and vomiting though  currently receiving chemotherapy.   Nursing staff denies any overt blood in stool or melena thus far during admission.    Last TCS: 2020 TAs  Last EGD 2020 large gastric ulcer, no H pylori.   Past Medical History:  Diagnosis Date   Cancer ([facility]    bladder   Cataract    COVID-19 11/13/2019   h/a, chills, fatigue, loss of taste and smell, all symptoms resolved in a few weeks   Frequent urination    Fuchs' corneal dystrophy    Full dentures    GERD (gastroesophageal reflux disease)    Hypertension    Rheumatoid arthritis ([place]     Past Surgical History:  Procedure Laterality Date   CORNEAL TRANSPLANT Left 2013   CYSTOSCOPY WITH BIOPSY N/A 09/14/2019   Procedure: CYSTOSCOPY WITH BIOPSY;  Surgeon: DFranchot Gallo MD;  Location: [facility];  Service: Urology;  Laterality: N/A;   CYSTOSCOPY WITH INJECTION N/A 11/29/2020   Procedure: CYSTOSCOPY WITH INJECTION OF INDOCYANINE GREEN DYE;  Surgeon: MAlexis Frock MD;  Location: [facility];  Service: Urology;  Laterality: N/A;  6 HRS   FULGURATION OF BLADDER TUMOR N/A 09/14/2019   Procedure: FULGURATION OF BLADDER TUMOR;  Surgeon: DFranchot Gallo MD;  Location: [facility];  Service: Urology;  Laterality: N/A;   IR IMAGING GUIDED PORT INSERTION  06/07/2021   IR NEPHROSTOMY EXCHANGE LEFT  10/30/2020   IR NEPHROSTOMY EXCHANGE LEFT  11/19/2020   IR NEPHROSTOMY EXCHANGE RIGHT  10/30/2020   IR NEPHROSTOMY EXCHANGE RIGHT  11/19/2020   IR NEPHROSTOMY PLACEMENT LEFT  09/27/2020  IR NEPHROSTOMY PLACEMENT RIGHT  09/27/2020   ROBOT ASSISTED LAPAROSCOPIC COMPLETE CYSTECT ILEAL CONDUIT N/A 11/29/2020   Procedure: XI ROBOTIC ASSISTED LAPAROSCOPIC COMPLETE CYSTECT ILEAL CONDUIT/RADICAL PROSTATECTOMY/LYMPHADENECTOMY;  Surgeon: Alexis Frock, MD;  Location[facility];  Service: Urology;  Laterality: N/A;   TRANSURETHRAL RESECTION OF BLADDER TUMOR WITH MITOMYCIN-C N/A 08/05/2019   Procedure: TRANSURETHRAL RESECTION OF BLADDER TUMOR;  Surgeon: Franchot Gallo, MD;   Location: [facility];  Service: Urology;  Laterality: N/A;  1 HR   TRANSURETHRAL RESECTION OF BLADDER TUMOR WITH MITOMYCIN-C N/A 04/24/2020   Procedure: TRANSURETHRAL RESECTION OF BLADDER TUMOR WITH GEMCIDABINE;  Surgeon: Franchot Gallo, MD;  Location: [facility];  Service: Urology;  Laterality: N/A;    Prior to Admission medications   Medication Sig Start Date End Date Taking? Authorizing Provider  acetaminophen (TYLENOL) 650 MG CR tablet Take 1,300 mg by mouth every morning.   Yes [provider]  ARIPiprazole (ABILIFY) 5 MG tablet Take 1 tablet (5 mg total) by mouth daily. 12/19/22  Yes Cook, Jayce G, DO  ciprofloxacin (CIPRO) 500 MG tablet Take 1 tablet (500 mg total) by mouth 2 (two) times daily for 5 days. 12/30/22 01/04/23 Yes Walisiewicz, Kaitlyn E, PA-C  dexamethasone (DECADRON) 4 MG tablet Take 1 tablet (4 mg total) by mouth 2 (two) times daily. Take for 3 days after each chemotherapy treatment 10/25/22  Yes Wyatt Portela, MD  diazepam (VALIUM) 2 MG tablet Take 1 tablet (2 mg total) by mouth every 8 (eight) hours as needed for anxiety. 12/17/22  Yes Pickenpack-Cousar, Carlena Sax, NP  diclofenac Sodium (VOLTAREN) 1 % GEL Apply 4 g topically 4 (four) times daily. Apply to knees 12/15/22  Yes Adhikari, Tamsen Meek, MD  gabapentin (NEURONTIN) 300 MG capsule Take 1 capsule (300 mg total) by mouth at bedtime. 12/24/22  Yes Pickenpack-Cousar, Carlena Sax, NP  hydroxychloroquine (PLAQUENIL) 200 MG tablet Take 200 mg by mouth 2 (two) times daily. 08/09/22  Yes [provider]  Lactulose 20 GM/30ML SOLN Take 30 mLs (20 g total) by mouth 2 (two) times daily as needed (take 2 times daily as needed for constipation). 12/25/22  Yes Pickenpack-Cousar, Carlena Sax, NP  lidocaine (XYLOCAINE) 5 % ointment Apply 1 Application topically 3 (three) times daily as needed. Use no more than 6 inches of ointment per application [id number]  Yes Samella Parr, NP  Multiple Vitamin  (MULTIVITAMIN WITH MINERALS) TABS tablet Take 1 tablet by mouth daily. 09/18/21  Yes Debbe Odea, MD  omeprazole (PRILOSEC) 40 MG capsule Take 40 mg by mouth daily.   Yes [provider]  ondansetron (ZOFRAN-ODT) 4 MG disintegrating tablet Take 1 tablet (4 mg total) by mouth every 8 (eight) hours as needed for nausea or vomiting. 12/23/22  Yes Truitt Merle, MD  oxyCODONE (ROXICODONE) 15 MG immediate release tablet Take 1 tablet (15 mg total) by mouth every 4 (four) hours as needed for severe pain. 12/19/22  Yes Pickenpack-Cousar, Carlena Sax, NP  oxyCODONE 10 MG TABS Take 1 tablet (10 mg total) by mouth every 6 (six) hours as needed for breakthrough pain. 12/15/22  Yes Shelly Coss, MD  prednisoLONE acetate (PRED FORTE) 1 % ophthalmic suspension Place 1 drop into the left eye 2 (two) times a week. Instill 1 drop into left eye Monday and Friday.   Yes [provider]  promethazine (PHENERGAN) 25 MG tablet Take 1 tablet (25 mg total) by mouth every 6 (six) hours as needed for nausea or vomiting. 12/23/22  Yes  Truitt Merle, MD  rosuvastatin (CRESTOR) 20 MG tablet Take 1 tablet (20 mg total) by mouth daily. 12/02/22  Yes Cook, Jayce G, DO  sertraline (ZOLOFT) 100 MG tablet Take 1 tablet (100 mg total) by mouth daily. 09/24/22  Yes Cook, Jayce G, DO  predniSONE (DELTASONE) 10 MG tablet Take 1 tablet (10 mg total) by mouth daily. Take 4 pills daily for 3 days, 3 pills daily for 3 days, 2 pills daily for 3 days, 1 pill daily for 3 days then stop Patient not taking: Reported on 01/10/2023 12/15/22   Shelly Coss, MD  predniSONE (DELTASONE) 5 MG tablet Take 4 tablets (20 mg total) by mouth daily as needed (flare up). Can resume after finishing the tapering dose Patient not taking: Reported on 12/29/2022 12/15/22   Shelly Coss, MD    Current Facility-Administered Medications  Medication Dose Route Frequency Provider Last Rate Last Admin   acetaminophen (TYLENOL) tablet 650 mg  650 mg Oral Q6H PRN Manuella Ghazi,  Pratik D, DO       Or   acetaminophen (TYLENOL) suppository 650 mg  650 mg Rectal Q6H PRN Heath Lark D, DO   650 mg at 01/01/23 [id number]   ARIPiprazole (ABILIFY) tablet 5 mg  5 mg Oral Daily Manuella Ghazi, Pratik D, DO   5 mg at 01/02/2023 1000   cefTRIAXone (ROCEPHIN) 2 g in sodium chloride 0.9 % 100 mL IVPB  2 g Intravenous Q24H Manuella Ghazi, Pratik D, DO 200 mL/hr at 01/01/23 1133 2 g at 01/01/23 1133   Chlorhexidine Gluconate Cloth 2 % PADS 6 each  6 each Topical Q0600 Heath Lark D, DO   6 each at 01/01/23 0954   dextrose 5 %-0.9 % sodium chloride infusion   Intravenous Continuous Heath Lark D, DO 100 mL/hr at 01/01/23 1207 New Bag at 01/01/23 1207   diazepam (VALIUM) tablet 2 mg  2 mg Oral Q8H PRN Manuella Ghazi, Pratik D, DO       gabapentin (NEURONTIN) capsule 300 mg  300 mg Oral QHS Manuella Ghazi, Pratik D, DO   300 mg at 01/11/2023 2017   HYDROmorphone (DILAUDID) injection 0.5-1 mg  0.5-1 mg Intravenous Q2H PRN Manuella Ghazi, Pratik D, DO   1 mg at 01/01/23 1204   ondansetron (ZOFRAN) tablet 4 mg  4 mg Oral Q6H PRN Manuella Ghazi, Pratik D, DO       Or   ondansetron (ZOFRAN) injection 4 mg  4 mg Intravenous Q6H PRN Manuella Ghazi, Pratik D, DO       oxyCODONE (Oxy IR/ROXICODONE) immediate release tablet 15 mg  15 mg Oral Q4H PRN Manuella Ghazi, Pratik D, DO   15 mg at 01/09/2023 1424   pantoprazole (PROTONIX) injection 40 mg  40 mg Intravenous Q12H Shah, Pratik D, DO       Facility-Administered Medications Ordered in Other Encounters  Medication Dose Route Frequency Provider Last Rate Last Admin   acetaminophen (TYLENOL) 325 MG tablet            diphenhydrAMINE (BENADRYL) 25 mg capsule            gemcitabine (GEMZAR) chemo syringe for bladder instillation 2,000 mg  2,000 mg Bladder Instillation Once Franchot Gallo, MD       gemcitabine [facility]) chemo syringe for bladder instillation 2,000 mg  2,000 mg Bladder Instillation Once Franchot Gallo, MD        Allergies as of 01/06/2023 - Review Complete 12/29/2022  Allergen Reaction Noted   Ace inhibitors  Cough 02/04/2013   Diflucan [fluconazole] Other (See Comments)  09/27/2020   Morphine Nausea And Vomiting 12/14/2022    History reviewed. No pertinent family history.  Social History   Socioeconomic History   Marital status: Married    Spouse name: Not on file   Number of children: Not on file   Years of education: Not on file   Highest education level: Not on file  Occupational History   Not on file  Tobacco Use   Smoking status: Former    Packs/day: 0.50    Years: 20.00    Total pack years: 10.00    Types: Cigarettes   Smokeless tobacco: Never   Tobacco comments:    quit 2010  Vaping Use   Vaping Use: Never used  Substance and Sexual Activity   Alcohol use: Yes    Comment: occ  2 x month   Drug use: No   Sexual activity: Yes  Other Topics Concern   Not on file  Social History Narrative   Not on file   Social Determinants of Health   Financial Resource Strain: Not on file  Food Insecurity: Unknown (01/13/2023)   Hunger Vital Sign    Worried About Running Out of Food in the Last Year: Patient refused    Dyess in the Last Year: Patient refused  Transportation Needs: Unknown (01/02/2023)   PRAPARE - Hydrologist (Medical): Patient refused    Lack of Transportation (Non-Medical): Patient refused  Physical Activity: Not on file  Stress: Not on file  Social Connections: Not on file  Intimate Partner Violence: Unknown (01/07/2023)   Humiliation, Afraid, Rape, and Kick questionnaire    Fear of Current or Ex-Partner: Patient refused    Emotionally Abused: Patient refused    Physically Abused: No    Sexually Abused: Patient refused    Review of Systems   Unable to assess due to patient's mental status   Physical Exam   Vital Signs in last 24 hours: Temp:  [99.4 F (37.4 C)-103 F (39.4 C)] 100.5 F (38.1 C) (02/14 0730) Pulse Rate:  [109-125] 119 (02/14 1200) Resp:  [17-29] 25 (02/14 1200) BP: (87-177)/(51-154) 87/60  (02/14 1000) SpO2:  [91 %-100 %] 91 % (02/14 1200) Last BM Date : 12/29/22  General:  obtunded, minimally responsive to painful stimuli Head:  Normocephalic and atraumatic. Eyes:  Sclera clear, no icterus.   Conjunctiva pink. Mouth:  No deformity or lesions, dentition normal. Lungs:  Clear throughout to auscultation.   No wheezes, crackles, or rhonchi. No acute distress. Port in place to R chest Heart:  Regular rate and rhythm; no murmurs, clicks, rubs,  or gallops. Abdomen:  Soft, nontender and nondistended. No masses, hepatosplenomegaly or hernias noted. Normal bowel sounds, without guarding, and without rebound.   Msk:  Symmetrical without gross deformities. Normal posture. Extremities:  Without clubbing or edema. Neurologic:  obtunded, minimally responsive to painful stimuli  Skin:  Intact without significant lesions or rashes. Psych:  unable to assess, as above   Intake/Output from previous day: 02/13 0701 - 02/14 0700 In: 2418.6 [I.V.:1968.6; IV Piggyback:450] Out: 1925 [Urine:1925] Intake/Output this shift: Total I/O In: 100.8 [IV Piggyback:100.8] Out: 245 [Urine:245]   Labs/Studies   Recent Labs Recent Labs    12/30/22 1200 12/30/22 1347 01/04/2023 0535 01/01/23 0435 01/01/23 1437  WBC 5.9  --  7.1 11.1*  --   HGB 7.7*  --  9.3* 8.8* 8.2*  HCT 23.5*  --  29.4* 27.3* 25.5*  PLT 45* 46* 40*  39*  --    BMET Recent Labs    12/30/22 1200 12/30/2022 0535 01/01/23 0435  NA 136 133* 133*  K 3.6 4.2 4.7  CL 104 103 104  CO2 23 20* 18*  GLUCOSE 88 85 75  BUN 23* 25* 27*  CREATININE 1.24 1.30* 1.33*  CALCIUM 8.5* 8.3* 8.1*   LFT Recent Labs    12/30/22 1200 01/03/2023 0535 01/01/23 0435  PROT 5.6* 5.9* 5.2*  ALBUMIN 3.1* 2.8* 2.4*  AST 29 42* 34  ALT 8 11 10  $ ALKPHOS 349* 333* 261*  BILITOT 0.5 0.8 1.2   PT/INR Recent Labs    12/30/22 1347 01/12/2023 0535  LABPROT 15.3* 14.9  INR 1.2 1.2   Hepatitis Panel No results for input(s): "HEPBSAG", "HCVAB",  "HEPAIGM", "HEPBIGM" in the last 72 hours. C-Diff No results for input(s): "CDIFFTOX" in the last 72 hours.  Radiology/Studies CT HEAD WO CONTRAST (5MM)  Result Date: 01/01/2023 CLINICAL DATA:  Mental status change, unknown cause EXAM: CT HEAD WITHOUT CONTRAST TECHNIQUE: Contiguous axial images were obtained from the base of the skull through the vertex without intravenous contrast. RADIATION DOSE REDUCTION: This exam was performed according to the departmental dose-optimization program which includes automated exposure control, adjustment of the mA and/or kV according to patient size and/or use of iterative reconstruction technique. COMPARISON:  None Available. FINDINGS: Brain: No evidence of acute large vascular territory infarct, acute hemorrhage, intraparenchymal mass lesion, midline shift or hydrocephalus. 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 Vascular: No hyperdense vessel identified. Skull: No acute fracture. Sinuses/Orbits: Paranasal sinus mucosal thickening. No acute orbital findings. Other: Small right mastoid effusion with middle ear fluid. IMPRESSION: 1. No evidence of acute intracranial abnormality. 2. 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 Electronically Signed   By: Margaretha Sheffield M.D.   On: 01/01/2023 11:29   DG Chest Port 1 View  Result Date: 01/09/2023 CLINICAL DATA:  Questionable sepsis EXAM: PORTABLE CHEST 1 VIEW COMPARISON:  12/13/2022 FINDINGS: Low volume chest. Hazy appearance of the bases. No suspected edema. No pneumothorax. Porta catheter on the right with tip at the right atrium. IMPRESSION: Hazy appearance at the bases, cannot exclude pneumonia in this clinical setting. Electronically Signed   By: Jorje Guild M.D.   On: 01/12/2023 06:09     Assessment   CLEAVEN REUTHER is a  62 y.o. 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 appears to have been declining with worsening lethargy and poor oral intake over the last 3-4 weeks. admitted for sepsis secondary to UTI. Continued anemia throughout admission with positive FOBT, also with elevated Alk Phos, GI consulted for further evaluation.  Anemia/heme positive stool:  chronically low hgb though did require transfusion earlier in the week for hgb of 7.7. no overt GI bleeding per family.  FOBT positive, though also with chronic thrombocytopenia with plt count 39k.  Likely multifactorial in setting of known malignancy, chemotherapy, also concern for PUD given history of gastric ulcer and frequent use of ibuprofen recently up to TID for pain control at home. He is maintained on PPI which wife reports compliance with.   Would likely benefit from EGD for further  evaluation of potential PUD, though given acute illness and metabolic encephalopathy, will need to hold off on this until he has clinically improved. Continue with protonix 71m BID. If patient becomes able to tolerate POs, could consider carafate 1g QID empirically. Should continue to trend h&h, monitor for overt GI bleeding and avoid all NSAIDs. can proceed with  EGD once medically stable.   I discussed this plan with patient's wife and daughter, All questions were answered, both wife and daughter verbalized understanding and are in agreement with plan as outlined above.   Elevated Alk Phos: other LFTs WNL, likely secondary to known bone mets. Will check GGT for further deliniation of this.   Plan / Recommendations   Trend h&h Monitor for overt GI bleeding Check GGT Continue PPI BID  EGD once medically stable Avoid NSAIDs    01/01/2023, 2:55 PM  Jashley Yellin L. CAlver Sorrow  MSN, APRN, AGNP-C Adult-Gerontology Nurse Practitioner [facility]

## 2023-01-01 NOTE — TOC Initial Note (Signed)
Transition of Care [facility]) - Initial/Assessment Note    Patient Details  Name: Barry Gibson MRN: [id number] Date of Birth: May 31, 1961  [facility] CM/SW Contact:    Shade Flood, LCSW Phone Number: 01/01/2023, 10:10 AM  Clinical Narrative:                  Pt admitted from home. He has a high readmission risk score. Per MD, pt is very sick at this time and is oriented only to self.   TOC reviewed pt's chart. It appears pt resides with his wife and is currently active with outpatient palliative services related to bladder cancer diagnosis.  TOC will further assess and assist as care plan develops.   Barriers to Discharge: Continued Medical Work up   Patient Goals and CMS Choice            Expected Discharge Plan and Services In-house Referral: Clinical Social Work     Living arrangements for the past 2 months: Single Family Home                                      Prior Living Arrangements/Services Living arrangements for the past 2 months: Single Family Home Lives with:: Spouse Patient language and need for interpreter reviewed:: Yes        Need for Family Participation in Patient Care: Yes (Comment) Care giver support system in place?: Yes (comment) Current home services: DME Criminal Activity/Legal Involvement Pertinent to Current Situation/Hospitalization: No - Comment as needed  Activities of Daily Living Home Assistive Devices/Equipment: Dentures (specify type) ADL Screening (condition at time of admission) Patient's cognitive ability adequate to safely complete daily activities?: Yes Is the patient deaf or have difficulty hearing?: No Does the patient have difficulty seeing, even when wearing glasses/contacts?: No Does the patient have difficulty concentrating, remembering, or making decisions?: No Patient able to express need for assistance with ADLs?: Yes Does the patient have difficulty dressing or bathing?:  Yes Independently performs ADLs?: No Communication: Independent Dressing (OT): Needs assistance Is this a change from baseline?: Change from baseline, expected to last <3days Grooming: Needs assistance Is this a change from baseline?: Change from baseline, expected to last <3 days Feeding: Needs assistance Is this a change from baseline?: Change from baseline, expected to last <3 days Bathing: Needs assistance Is this a change from baseline?: Change from baseline, expected to last <3 days Toileting: Needs assistance Is this a change from baseline?: Change from baseline, expected to last <3 days In/Out Bed: Needs assistance Is this a change from baseline?: Change from baseline, expected to last <3 days Walks in Home: Needs assistance Is this a change from baseline?: Change from baseline, expected to last <3 days Does the patient have difficulty walking or climbing stairs?: Yes Weakness of Legs: Both Weakness of Arms/Hands: Both  Permission Sought/Granted                  Emotional Assessment       Orientation: : Oriented to Self Alcohol / Substance Use: Not Applicable Psych Involvement: No (comment)  Admission diagnosis:  Confusion [R41.0] Hyponatremia [E87.1] Thrombocytopenia ([place]) [D69.6] Renal insufficiency [N28.9] Elevated AST (SGOT) [R74.01] Elevated alkaline phosphatase level [R74.8] Sepsis ([place]) [A41.9] Urinary tract infection with hematuria, site unspecified [N39.0, R31.9] Anemia, unspecified type [D64.9] Patient Active Problem List   Diagnosis Date Noted   Sepsis ([place]) 01/09/2023   Advanced  care planning/counseling discussion 12/13/2022   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 ([place]) 12/03/2022   AAA (abdominal aortic aneurysm) ([place]) 09/02/2022   CKD (chronic kidney disease) stage 3, GFR 30-59 ml/min ([place]) 08/19/2022   GERD (gastroesophageal reflux disease) 11/17/2021   Depression 11/17/2021   Port-A-Cath in place 07/27/2021   History of peptic ulcer  09/24/2020   Bladder cancer ([place]) 03/06/2020   Peptic ulcer disease 02/09/2020   Rheumatoid arthritis ([place]) 04/07/2018   Essential hypertension, benign 02/05/2013   Hyperlipidemia 02/04/2013   PCP:  Coral Spikes, DO Pharmacy:   [place], [place] - [address] [place] [address] Phone: [phone] Fax: [place] [address], [place] [address] Phone: [phone] Fax: [phone]  [facility] [phone] - [place], [place] [address] [place] [address] Phone: [phone] Fax: [phone]  [id number] [place]: [phone]: [phone] [place] [id number] [place]Phone: [phone]: [phone]    Social Determinants of Health (SDOH) Social History: SDOH Screenings   Food Insecurity: Unknown (01/10/2023)  Housing: Low Risk  (01/15/2023)  Transportation Needs: Unknown (01/09/2023)  Utilities: Unknown (12/24/2022)  Depression (PHQ2-9): Low Risk  (09/02/2022)  Tobacco Use: Medium Risk (12/22/2022)   SDOH Interventions:     Readmission Risk Interventions    01/01/2023   10:08 AM  Readmission Risk Prevention Plan  Transportation Screening Complete  Medication Review ([place] Complete  HRI or [place] Applicable

## 2023-01-02 ENCOUNTER — Inpatient Hospital Stay (HOSPITAL_COMMUNITY): Payer: BC Managed Care – PPO

## 2023-01-02 DIAGNOSIS — K921 Melena: Secondary | ICD-10-CM

## 2023-01-02 DIAGNOSIS — A419 Sepsis, unspecified organism: Secondary | ICD-10-CM | POA: Diagnosis not present

## 2023-01-02 LAB — COMPREHENSIVE METABOLIC PANEL
ALT: 13 U/L (ref 0–44)
AST: 39 U/L (ref 15–41)
Albumin: 2.2 g/dL — ABNORMAL LOW (ref 3.5–5.0)
Alkaline Phosphatase: 249 U/L — ABNORMAL HIGH (ref 38–126)
Anion gap: 10 (ref 5–15)
BUN: 27 mg/dL — ABNORMAL HIGH (ref 6–20)
CO2: 19 mmol/L — ABNORMAL LOW (ref 22–32)
Calcium: 8 mg/dL — ABNORMAL LOW (ref 8.9–10.3)
Chloride: 106 mmol/L (ref 98–111)
Creatinine, Ser: 1.11 mg/dL (ref 0.61–1.24)
GFR, Estimated: >60 mL/min (ref >60)
Glucose, Bld: 147 mg/dL — ABNORMAL HIGH (ref 70–99)
Potassium: 4.1 mmol/L (ref 3.5–5.1)
Sodium: 135 mmol/L (ref 135–145)
Total Bilirubin: 0.7 mg/dL (ref 0.3–1.2)
Total Protein: 5.3 g/dL — ABNORMAL LOW (ref 6.5–8.1)

## 2023-01-02 LAB — CBC
HCT: 23.5 % — ABNORMAL LOW (ref 39.0–52.0)
Hemoglobin: 7.7 g/dL — ABNORMAL LOW (ref 13.0–17.0)
MCH: 29.2 pg (ref 26.0–34.0)
MCHC: 32.8 g/dL (ref 30.0–36.0)
MCV: 89 fL (ref 80.0–100.0)
Platelets: 37 10*3/uL — ABNORMAL LOW (ref 150–400)
RBC: 2.64 MIL/uL — ABNORMAL LOW (ref 4.22–5.81)
RDW: 17.1 % — ABNORMAL HIGH (ref 11.5–15.5)
WBC: 10.4 10*3/uL (ref 4.0–10.5)
nRBC: 1.9 % — ABNORMAL HIGH (ref 0.0–0.2)

## 2023-01-02 LAB — MAGNESIUM: Magnesium: 2 mg/dL (ref 1.7–2.4)

## 2023-01-02 MED ORDER — IOHEXOL 300 MG/ML  SOLN
75.0000 mL | Freq: Once | INTRAMUSCULAR | Status: AC | PRN
  Administered 2023-01-02: 75 mL via INTRAVENOUS

## 2023-01-02 NOTE — Progress Notes (Signed)
Gastroenterology Progress Note   Referring Provider: No ref. provider found Primary Care Physician:  Coral Spikes, DO Primary Gastroenterologist:  Dr. Eusebio Friendly [facility])  Patient ID: Barry Gibson; [name]; [birth year]   Subjective:    Alert. States his right arm hurts. Nursing noted mass left neck region. With palpation, patient notes tenderness. No melena, brbpr. Tolerated clear liquids, slowly.   Objective:   Vital signs in last 24 hours: Temp:  [98.2 F (36.8 C)-100.6 F (38.1 C)] 98.2 F (36.8 C) (02/15 0425) Pulse Rate:  [107-125] 112 (02/15 0500) Resp:  [14-27] 20 (02/15 0500) BP: (87-157)/(60-135) 103/69 (02/15 0500) SpO2:  [91 %-98 %] 92 % (02/15 0500) Last BM Date : 12/29/22 General:   Alert,  chronically ill appearing, limited history. Does not respond to most questions.  Head:  Normocephalic and atraumatic. Eyes:  Sclera clear, no icterus.  Neck: fullness palpated in the left neck, tender Chest: CTA bilaterally without rales, rhonchi, crackles.   Port in right chest.  Heart:  Regular rate and rhythm; no murmurs, clicks, rubs,  or gallops. Abdomen:  Soft, nontender and nondistended.  Extremities:  Without clubbing, deformity or edema. Neurologic:  Alert   Skin:  Intact without significant lesions or rashes. Psych:  Alert otherwise unable to assess  Intake/Output from previous day: 02/14 0701 - 02/15 0700 In: 622.4 [I.V.:421.7; IV Piggyback:200.8] Out: 1045 [Urine:1045] Intake/Output this shift: Total I/O In: -  Out: 150 [Urine:150]  Lab Results: CBC Recent Labs    01/04/2023 0535 01/01/23 0435 01/01/23 1437 01/01/23 2047 01/02/23 0305  WBC 7.1 11.1*  --   --  10.4  HGB 9.3* 8.8* 8.2* 8.3* 7.7*  HCT 29.4* 27.3* 25.5* 25.6* 23.5*  MCV 90.2 90.4  --   --  89.0  PLT 40* 39*  --   --  37*   BMET Recent Labs    12/21/2022 0535 01/01/23 0435 01/02/23 0305  NA 133* 133* 135  K 4.2 4.7 4.1  CL 103 104 106  CO2 20* 18* 19*  GLUCOSE 85  75 147*  BUN 25* 27* 27*  CREATININE 1.30* 1.33* 1.11  CALCIUM 8.3* 8.1* 8.0*   LFTs Recent Labs    12/22/2022 0535 01/01/23 0435 01/02/23 0305  BILITOT 0.8 1.2 0.7  ALKPHOS 333* 261* 249*  AST 42* 34 39  ALT 11 10 13  $ PROT 5.9* 5.2* 5.3*  ALBUMIN 2.8* 2.4* 2.2*   No results for input(s): "LIPASE" in the last 72 hours. PT/INR Recent Labs    12/30/22 1347 01/07/2023 0535  LABPROT 15.3* 14.9  INR 1.2 1.2         Imaging Studies: CT HEAD WO CONTRAST (5MM)  Result Date: 01/01/2023 CLINICAL DATA:  Mental status change, unknown cause EXAM: CT HEAD WITHOUT CONTRAST TECHNIQUE: Contiguous axial images were obtained from the base of the skull through the vertex without intravenous contrast. RADIATION DOSE REDUCTION: This exam was performed according to the departmental dose-optimization program which includes automated exposure control, adjustment of the mA and/or kV according to patient size and/or use of iterative reconstruction technique. COMPARISON:  None Available. FINDINGS: Brain: No evidence of acute large vascular territory infarct, acute hemorrhage, intraparenchymal mass lesion, midline shift or hydrocephalus. 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 Vascular: No hyperdense vessel identified. Skull: No acute fracture. Sinuses/Orbits: Paranasal sinus mucosal thickening. No acute orbital findings. Other: Small right mastoid effusion with middle ear fluid.  IMPRESSION: 1. No evidence of acute intracranial abnormality. 2. 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 Electronically Signed   By: Margaretha Sheffield M.D.   On: 01/01/2023 11:29   DG Chest Port 1 View  Result Date: 12/30/2022 CLINICAL DATA:  Questionable sepsis EXAM: PORTABLE CHEST 1 VIEW COMPARISON:  12/13/2022 FINDINGS: Low volume chest. Hazy  appearance of the bases. No suspected edema. No pneumothorax. Porta catheter on the right with tip at the right atrium. IMPRESSION: Hazy appearance at the bases, cannot exclude pneumonia in this clinical setting. Electronically Signed   By: Jorje Guild M.D.   On: 12/19/2022 06:09   DG Chest Port 1 View  Result Date: 12/13/2022 CLINICAL DATA:  Hypotension EXAM: PORTABLE CHEST 1 VIEW COMPARISON:  09/16/2022 FINDINGS: Right-sided chest port remains in place. Normal heart size. No focal airspace consolidation, pleural effusion, or pneumothorax. Known osseous metastatic disease, better seen on cross-sectional imaging. IMPRESSION: No active disease. Electronically Signed   By: Davina Poke D.O.   On: 12/13/2022 16:24   MR THORACIC SPINE W WO CONTRAST  Result Date: 12/11/2022 CLINICAL DATA:  Malignant neoplasm of urinary bladder, unspecified site ([place]) C67.9 (ICD-10-CM). Metastatic disease evaluation. EXAM: MRI THORACIC AND LUMBAR SPINE WITHOUT AND WITH CONTRAST TECHNIQUE: Multiplanar and multiecho pulse sequences of the thoracic and lumbar spine were obtained without and with intravenous contrast. CONTRAST:  41m GADAVIST GADOBUTROL 1 MMOL/ML IV SOLN COMPARISON:  CT chest abdomen and pelvis November 21, 2022. FINDINGS: MRI THORACIC SPINE FINDINGS Alignment:  Physiologic. Vertebrae: Diffuse T1 hypointensity throughout the thoracic vertebrae and visualized portion of the ribs with heterogeneous signal on T2 and STIR and heterogeneous diffuse contrast enhancement, consistent with diffuse metastatic disease to the bones. T1 localizer images of the cervical spine also show diffuse T1 signal abnormality. There is no associated fracture or retropulsion. Cord:  Normal signal and morphology. Paraspinal and other soft tissues: Cholelithiasis. Minimal bilateral pleural effusions. Disc levels: Tiny posterior disc protrusions are seen at T5-6, T6-7 and T7-8. No significant spinal canal or neural foraminal stenosis at any  level. MRI LUMBAR SPINE FINDINGS Segmentation:  Standard. Alignment:  Physiologic. Vertebrae: Diffuse T1 hypointensity throughout the lumbar spine, sacral and visualized pelvic bones with heterogeneous signal on T2 and STIR and heterogeneous diffuse contrast enhancement, consistent with diffuse metastatic disease to the bones. There is no associated fracture or retropulsion. Conus medullaris: Extends to the L1 level and appears normal. Paraspinal and other soft tissues: Stable 4 cm infrarenal aortic aneurysm. Disc levels: T12-L1, L1-2 and L2-3: No spinal canal or neural foraminal stenosis. L3-4: Disc bulge and mild facet degenerative changes with ligamentum flavum redundancy resulting in mild spinal canal stenosis with moderate narrowing of the bilateral subarticular zones and moderate bilateral neural foraminal narrowing. L4-5: Disc bulge, mild-to-moderate facet degenerative changes and mild ligamentum flavum redundancy resulting in mild spinal canal stenosis with mild narrowing of the bilateral subarticular zones, moderate right and mild left neural foraminal narrowing. L5-S1: Left central disc protrusion and mild facet degenerative changes resulting in moderate narrowing of the left subarticular zone, mild right and moderate left neural foraminal narrowing. IMPRESSION: 1. Diffuse osseous metastatic disease to the spine, ribs, sacrum and visualized pelvic bones. No associated fracture or retropulsion. 2. Degenerative changes of the lumbar spine with mild spinal canal stenosis at L3-4 and L4-5 and moderate narrowing of the bilateral subarticular zones and moderate bilateral neural foraminal narrowing at L3-4. 3. Moderate narrowing  of the left subarticular zone and moderate left neural foraminal narrowing at L5-S1. 4. Cholelithiasis. 5. Stable 4 cm infrarenal aortic aneurysm. Electronically Signed   By: Pedro Earls M.D.   On: 12/11/2022 09:20   MR Lumbar Spine W Wo Contrast  Result Date:  12/11/2022 CLINICAL DATA:  Malignant neoplasm of urinary bladder, unspecified site ([place]) C67.9 (ICD-10-CM). Metastatic disease evaluation. EXAM: MRI THORACIC AND LUMBAR SPINE WITHOUT AND WITH CONTRAST TECHNIQUE: Multiplanar and multiecho pulse sequences of the thoracic and lumbar spine were obtained without and with intravenous contrast. CONTRAST:  88m GADAVIST GADOBUTROL 1 MMOL/ML IV SOLN COMPARISON:  CT chest abdomen and pelvis November 21, 2022. FINDINGS: MRI THORACIC SPINE FINDINGS Alignment:  Physiologic. Vertebrae: Diffuse T1 hypointensity throughout the thoracic vertebrae and visualized portion of the ribs with heterogeneous signal on T2 and STIR and heterogeneous diffuse contrast enhancement, consistent with diffuse metastatic disease to the bones. T1 localizer images of the cervical spine also show diffuse T1 signal abnormality. There is no associated fracture or retropulsion. Cord:  Normal signal and morphology. Paraspinal and other soft tissues: Cholelithiasis. Minimal bilateral pleural effusions. Disc levels: Tiny posterior disc protrusions are seen at T5-6, T6-7 and T7-8. No significant spinal canal or neural foraminal stenosis at any level. MRI LUMBAR SPINE FINDINGS Segmentation:  Standard. Alignment:  Physiologic. Vertebrae: Diffuse T1 hypointensity throughout the lumbar spine, sacral and visualized pelvic bones with heterogeneous signal on T2 and STIR and heterogeneous diffuse contrast enhancement, consistent with diffuse metastatic disease to the bones. There is no associated fracture or retropulsion. Conus medullaris: Extends to the L1 level and appears normal. Paraspinal and other soft tissues: Stable 4 cm infrarenal aortic aneurysm. Disc levels: T12-L1, L1-2 and L2-3: No spinal canal or neural foraminal stenosis. L3-4: Disc bulge and mild facet degenerative changes with ligamentum flavum redundancy resulting in mild spinal canal stenosis with moderate narrowing of the bilateral subarticular zones  and moderate bilateral neural foraminal narrowing. L4-5: Disc bulge, mild-to-moderate facet degenerative changes and mild ligamentum flavum redundancy resulting in mild spinal canal stenosis with mild narrowing of the bilateral subarticular zones, moderate right and mild left neural foraminal narrowing. L5-S1: Left central disc protrusion and mild facet degenerative changes resulting in moderate narrowing of the left subarticular zone, mild right and moderate left neural foraminal narrowing. IMPRESSION: 1. Diffuse osseous metastatic disease to the spine, ribs, sacrum and visualized pelvic bones. No associated fracture or retropulsion. 2. Degenerative changes of the lumbar spine with mild spinal canal stenosis at L3-4 and L4-5 and moderate narrowing of the bilateral subarticular zones and moderate bilateral neural foraminal narrowing at L3-4. 3. Moderate narrowing of the left subarticular zone and moderate left neural foraminal narrowing at L5-S1. 4. Cholelithiasis. 5. Stable 4 cm infrarenal aortic aneurysm. Electronically Signed   By: KPedro EarlsM.D.   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 Admitted with sepsis secondary to UTI. Continued anemia throughout admission with hemoccult positive stool, recent need for blood transfusion prior to admission. GI consulted for further evaluation.   Anemia/Heme positive stool: notable anemia/thrombocytopenia in the past six weeks. Further decline in Hgb with symptomatic anemia noted during oncology visit as outpatient 12/30/22. One unit of prbcs infused due to Hgb 7.7. Hgb up to 9.3  post transfusion. In the 8 range yesterday. Down to 7.7 today. No overt GI bleeding per family or nursing  staff. Stool heme positive. Likely multifactorial in setting of known malignancy, chemo, also concern for PUD given prior history of gastric ulcers in 2020, frequent ibuprofen use, although he is maintained on PPI at home. Also with chronically low platelets, with currently at 37,000.   Although EGD could confirm ulcer disease, it would not necessarily change treatment unless he were to have overt bleeding requiring therapeutic intervention. At this time with acute illness, he is not a candidate for endoscopic evaluation. Would continue supportive measures with IV PPI BID. Continue to follow H/H closely and monitor for overt GI bleeding. Avoid NSAIDs.   Left neck mass: tender to palpation. To discuss with attending.     Plan:   Trend CBC, monitoring both H/H and platelets closely. Monitor for overt GI bleeding.  Continue IV PPI BID.  Avoid NSAIDs. Evaluation of left neck mass per attending. Will continue to follow.     LOS: 2 days   Laureen Ochs. Bernarda Caffey [facility] [phone] 2/15/20248:20 AM

## 2023-01-02 NOTE — Progress Notes (Signed)
PROGRESS NOTE    AC GLADE  [id number] DOB: 1961/03/11 DOA: 01/11/2023 PCP: Coral Spikes, DO   Brief Narrative:    Barry Gibson is a 62 y.o. male with medical history significant for hypertension, hyperlipidemia, rheumatoid arthritis, metastatic bladder cancer status post chemo/radiation, and cystectomy and ileal conduit placement who presented to the ED with complaints of fever and confusion as well as weakness.  Apparently he has been declining with worsening lethargy and poor oral intake over the last 3-4 weeks.  Patient was admitted for sepsis secondary to Serratia UTI.  He is noted to have improvement in his fever and mentation.  Assessment & Plan:   Principal Problem:   Sepsis ([place]) Active Problems:   Hyperlipidemia   Essential hypertension, benign   Rheumatoid arthritis (HCC)   Bladder cancer (HCC)   Port-A-Cath in place   Anemia   CKD (chronic kidney disease) stage 3, GFR 30-59 ml/min (HCC)   Elevated alkaline phosphatase level  Assessment and Plan:  Sepsis, POA, secondary to Serratia UTI Continue on Rocephin Blood cultures with no growth to date  Acute metabolic encephalopathy secondary to above Head CT WNL  Left neck mass Evaluate with CT neck 2/15   History of hypertension Currently with soft blood pressures, hold home amlodipine and losartan Monitor closely   Metastatic bladder cancer With prior cystectomy and ileal conduit placement and mets to spine Currently on chemoradiation and follows with oncology On chronic pain regimen   Anemia/thrombocytopenia with heme pos stool Appreciate GI following Continue PPI IV BID Continue to monitor throughout the stay Follows with hematology oncology regarding this   Severe back pain secondary to above Continues on narcotics, lidocaine patch, and Voltaren gel Prednisone   History of rheumatoid arthritis On hydroxychloroquine, hold for now   DVT prophylaxis:SCDs Code Status: DNR Family  Communication: Wife at bedside 2/14 Disposition Plan:  Status is: Inpatient Remains inpatient appropriate because: Need for IV medications.   Consultants:  GI Discussed with Oncologist Dr. Burr Medico 2/14  Procedures:  None  Antimicrobials:  Anti-infectives (From admission, onward)    Start     Dose/Rate Route Frequency Ordered Stop   01/01/23 1000  cefTRIAXone (ROCEPHIN) 2 g in sodium chloride 0.9 % 100 mL IVPB        2 g 200 mL/hr over 30 Minutes Intravenous Every 24 hours 01/01/23 0816     01/01/23 0700  cefTRIAXone (ROCEPHIN) 2 g in sodium chloride 0.9 % 100 mL IVPB  Status:  Discontinued        2 g 200 mL/hr over 30 Minutes Intravenous Every 24 hours 01/01/2023 0800 01/01/2023 1747   12/28/2022 1845  vancomycin (VANCOREADY) IVPB 1500 mg/300 mL  Status:  Discontinued        1,500 mg 150 mL/hr over 120 Minutes Intravenous  Once 01/14/2023 1750 01/09/2023 1752   01/04/2023 1845  vancomycin (VANCOREADY) IVPB 1750 mg/350 mL  Status:  Discontinued        1,750 mg 175 mL/hr over 120 Minutes Intravenous Every 24 hours 01/13/2023 1752 01/01/23 0816   01/04/2023 1750  ceFEPIme (MAXIPIME) 2 g in sodium chloride 0.9 % 100 mL IVPB  Status:  Discontinued        2 g 200 mL/hr over 30 Minutes Intravenous Every 8 hours 01/07/2023 1750 01/01/23 0816   12/27/2022 0715  cefTRIAXone (ROCEPHIN) 2 g in sodium chloride 0.9 % 100 mL IVPB        2 g 200 mL/hr over 30 Minutes Intravenous  Once 01/08/2023 0700 12/24/2022 0810       Subjective: Patient seen and evaluated today and is More awake and alert this morning with improvement in fever noted.  No other acute overnight events noted. Good urine output in urostomy bag that appears to be more clear.  Objective: Vitals:   01/02/23 0500 01/02/23 0700 01/02/23 0800 01/02/23 0900  BP: 103/69 119/70 128/74 (!) 145/80  Pulse: (!) 112 (!) 108 (!) 113 (!) 112  Resp: 20 16 18 20  $ Temp:   98.3 F (36.8 C)   TempSrc:   Oral   SpO2: 92% 92% 94% 95%  Weight:      Height:         Intake/Output Summary (Last 24 hours) at 01/02/2023 1045 Last data filed at 01/02/2023 1019 Gross per 24 hour  Intake 581.65 ml  Output 1195 ml  Net -613.35 ml   Filed Weights   12/19/2022 0846  Weight: 83.5 kg    Examination:  General exam: Appears more alert and awake and responding to questions appropriately Respiratory system: Clear to auscultation. Respiratory effort normal.  Nasal cannula oxygen. Cardiovascular system: S1 & S2 heard, RRR.  Gastrointestinal system: Abdomen is soft.  Urostomy with dark urine output Central nervous system: awake/alert Extremities: No edema Skin: No significant lesions noted    Data Reviewed: I have personally reviewed following labs and imaging studies  CBC: Recent Labs  Lab 12/27/22 0929 12/30/22 1200 12/30/22 1347 12/27/2022 0535 01/01/23 0435 01/01/23 1437 01/01/23 2047 01/02/23 0305  WBC 4.7 5.9  --  7.1 11.1*  --   --  10.4  NEUTROABS 2.9 3.5  --  5.1  --   --   --   --   HGB 8.1* 7.7*  --  9.3* 8.8* 8.2* 8.3* 7.7*  HCT 25.3* 23.5*  --  29.4* 27.3* 25.5* 25.6* 23.5*  MCV 91.3 89.4  --  90.2 90.4  --   --  89.0  PLT 72* 45* 46* 40* 39*  --   --  37*   Basic Metabolic Panel: Recent Labs  Lab 12/27/22 0929 12/30/22 1200 01/08/2023 0535 01/01/23 0435 01/02/23 0305  NA 138 136 133* 133* 135  K 3.7 3.6 4.2 4.7 4.1  CL 107 104 103 104 106  CO2 24 23 20* 18* 19*  GLUCOSE 102* 88 85 75 147*  BUN 26* 23* 25* 27* 27*  CREATININE 1.41* 1.24 1.30* 1.33* 1.11  CALCIUM 8.3* 8.5* 8.3* 8.1* 8.0*  MG  --   --   --  1.4* 2.0   GFR: Estimated Creatinine Clearance: 75.4 mL/min (by C-G formula based on SCr of 1.11 mg/dL). Liver Function Tests: Recent Labs  Lab 12/27/22 0929 12/30/22 1200 12/22/2022 0535 01/01/23 0435 01/02/23 0305  AST 30 29 42* 34 39  ALT 8 8 11 10 13  $ ALKPHOS 385* 349* 333* 261* 249*  BILITOT 0.4 0.5 0.8 1.2 0.7  PROT 5.6* 5.6* 5.9* 5.2* 5.3*  ALBUMIN 3.2* 3.1* 2.8* 2.4* 2.2*   No results for input(s):  "LIPASE", "AMYLASE" in the last 168 hours. No results for input(s): "AMMONIA" in the last 168 hours. Coagulation Profile: Recent Labs  Lab 12/30/22 1347 01/11/2023 0535  INR 1.2 1.2   Cardiac Enzymes: No results for input(s): "CKTOTAL", "CKMB", "CKMBINDEX", "TROPONINI" in the last 168 hours. BNP (last 3 results) No results for input(s): "PROBNP" in the last 8760 hours. HbA1C: No results for input(s): "HGBA1C" in the last 72 hours. CBG: No results for input(s): "GLUCAP"  in the last 168 hours. Lipid Profile: No results for input(s): "CHOL", "HDL", "LDLCALC", "TRIG", "CHOLHDL", "LDLDIRECT" in the last 72 hours. Thyroid Function Tests: No results for input(s): "TSH", "T4TOTAL", "FREET4", "T3FREE", "THYROIDAB" in the last 72 hours. Anemia Panel: No results for input(s): "VITAMINB12", "FOLATE", "FERRITIN", "TIBC", "IRON", "RETICCTPCT" in the last 72 hours. Sepsis Labs: Recent Labs  Lab 12/30/2022 0535 12/24/2022 1806  LATICACIDVEN 0.9 0.9    Recent Results (from the past 240 hour(s))  Urine Culture     Status: Abnormal   Collection Time: 12/30/22 12:00 PM   Specimen: Urine, Clean Catch  Result Value Ref Range Status   Specimen Description   Final    URINE, CLEAN CATCH Performed at [facility], [address] [place], [place] [address]    Special Requests   Final    URINE, CLEAN CATCH Performed at [facility], [place] [address], [place], [place] [address]    Culture >=100,000 COLONIES/mL SERRATIA MARCESCENS (A)  Final   Report Status 01/01/2023 FINAL  Final   Organism ID, Bacteria SERRATIA MARCESCENS (A)  Final      Susceptibility   Serratia marcescens - MIC*    CEFEPIME <=0.12 SENSITIVE Sensitive     CEFTRIAXONE <=0.25 SENSITIVE Sensitive     CIPROFLOXACIN <=0.25 SENSITIVE Sensitive     GENTAMICIN <=1 SENSITIVE Sensitive     NITROFURANTOIN 128 RESISTANT Resistant     TRIMETH/SULFA <=20 SENSITIVE Sensitive     * >=100,000  COLONIES/mL SERRATIA MARCESCENS  Blood Culture (routine x 2)     Status: None (Preliminary result)   Collection Time: 01/02/2023  5:35 AM   Specimen: BLOOD  Result Value Ref Range Status   Specimen Description BLOOD RIGHT ASSIST CONTROL  Final   Special Requests   Final    BOTTLES DRAWN AEROBIC AND ANAEROBIC Blood Culture results may not be optimal due to an excessive volume of blood received in culture bottles   Culture   Final    NO GROWTH 2 DAYS Performed at [facility], [address], [place], [place] [address]    Report Status PENDING  Incomplete  Urine Culture     Status: Abnormal   Collection Time: 01/03/2023  5:35 AM   Specimen: Urine, Random  Result Value Ref Range Status   Specimen Description   Final    URINE, RANDOM Performed at [facility], [address], [place], [place] [address]    Special Requests   Final    NONE Reflexed from [id number] Performed at [facility], [address] [place], [place] [address]    Culture (A)  Final    <10,000 COLONIES/mL INSIGNIFICANT GROWTH Performed at [facility], [place] [address] [place], [place] [address]    Report Status 01/01/2023 FINAL  Final  Resp panel by RT-PCR (RSV, Flu A&B, Covid) Anterior Nasal Swab     Status: None   Collection Time: 01/10/2023  5:38 AM   Specimen: Anterior Nasal Swab  Result Value Ref Range Status   SARS Coronavirus 2 by RT PCR NEGATIVE NEGATIVE Final    Comment: (NOTE) SARS-CoV-2 target nucleic acids are NOT DETECTED.  The SARS-CoV-2 RNA is generally detectable in upper respiratory specimens during the acute phase of infection. The lowest concentration of SARS-CoV-2 viral copies this assay can detect is 138 copies/mL. A negative result does not preclude SARS-Cov-2 infection and should not be used as the sole basis for treatment or other patient management decisions. A negative result  may occur with  improper specimen collection/handling, submission of specimen other than nasopharyngeal swab,  presence of viral mutation(s) within the areas targeted by this assay, and inadequate number of viral copies(<138 copies/mL). A negative result must be combined with clinical observations, patient history, and epidemiological information. The expected result is Negative.  Fact Sheet for Patients:  EntrepreneurPulse.com.au  Fact Sheet for Healthcare Providers:  IncredibleEmployment.be  This test is no t yet approved or cleared by the [place] FDA and  has been authorized for detection and/or diagnosis of SARS-CoV-2 by FDA under an Emergency Use Authorization (EUA). This EUA will remain  in effect (meaning this test can be used) for the duration of the COVID-19 declaration under Section 564(b)(1) of the Act, 21 U.S.C.section 360bbb-3(b)(1), unless the authorization is terminated  or revoked sooner.       Influenza A by PCR NEGATIVE NEGATIVE Final   Influenza B by PCR NEGATIVE NEGATIVE Final    Comment: (NOTE) The Xpert Xpress SARS-CoV-2/FLU/RSV plus assay is intended as an aid in the diagnosis of influenza from Nasopharyngeal swab specimens and should not be used as a sole basis for treatment. Nasal washings and aspirates are unacceptable for Xpert Xpress SARS-CoV-2/FLU/RSV testing.  Fact Sheet for Patients: EntrepreneurPulse.com.au  Fact Sheet for Healthcare Providers: IncredibleEmployment.be  This test is not yet approved or cleared by the [place] FDA and has been authorized for detection and/or diagnosis of SARS-CoV-2 by FDA under an Emergency Use Authorization (EUA). This EUA will remain in effect (meaning this test can be used) for the duration of the COVID-19 declaration under Section 564(b)(1) of the Act, 21 U.S.C. section 360bbb-3(b)(1), unless the authorization is terminated or revoked.     Resp Syncytial Virus by PCR NEGATIVE NEGATIVE Final    Comment: (NOTE) Fact Sheet for  Patients: EntrepreneurPulse.com.au  Fact Sheet for Healthcare Providers: IncredibleEmployment.be  This test is not yet approved or cleared by the [place] FDA and has been authorized for detection and/or diagnosis of SARS-CoV-2 by FDA under an Emergency Use Authorization (EUA). This EUA will remain in effect (meaning this test can be used) for the duration of the COVID-19 declaration under Section 564(b)(1) of the Act, 21 U.S.C. section 360bbb-3(b)(1), unless the authorization is terminated or revoked.  Performed at [facility], [address], [place], [place] [address]   Blood Culture (routine x 2)     Status: None (Preliminary result)   Collection Time: 12/26/2022  5:57 AM   Specimen: BLOOD  Result Value Ref Range Status   Specimen Description BLOOD port  Final   Special Requests   Final    BOTTLES DRAWN AEROBIC AND ANAEROBIC Blood Culture results may not be optimal due to an excessive volume of blood received in culture bottles   Culture   Final    NO GROWTH 2 DAYS Performed at [facility], [address], [place],  [address]    Report Status PENDING  Incomplete  MRSA Next Gen by PCR, Nasal     Status: None   Collection Time: 12/30/2022  8:50 AM   Specimen: Nasal Mucosa; Nasal Swab  Result Value Ref Range Status   MRSA by PCR Next Gen NOT DETECTED NOT DETECTED Final    Comment: (NOTE) The GeneXpert MRSA Assay (FDA approved for NASAL specimens only), is one component of a comprehensive MRSA colonization surveillance program. It is not intended to diagnose MRSA infection nor to guide or monitor treatment for MRSA infections. Test performance is not FDA approved in  patients less than 52 years old. Performed at [facility], [address] [place], [place] [address]          Radiology Studies: CT HEAD WO CONTRAST (5MM)  Result Date: 01/01/2023 CLINICAL DATA:  Mental status change, unknown cause EXAM: CT HEAD WITHOUT CONTRAST  TECHNIQUE: Contiguous axial images were obtained from the base of the skull through the vertex without intravenous contrast. RADIATION DOSE REDUCTION: This exam was performed according to the departmental dose-optimization program which includes automated exposure control, adjustment of the mA and/or kV according to patient size and/or use of iterative reconstruction technique. COMPARISON:  None Available. FINDINGS: Brain: No evidence of acute large vascular territory infarct, acute hemorrhage, intraparenchymal mass lesion, midline shift or hydrocephalus. 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 Vascular: No hyperdense vessel identified. Skull: No acute fracture. Sinuses/Orbits: Paranasal sinus mucosal thickening. No acute orbital findings. Other: Small right mastoid effusion with middle ear fluid. IMPRESSION: 1. No evidence of acute intracranial abnormality. 2. 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 Electronically Signed   By: Margaretha Sheffield M.D.   On: 01/01/2023 11:29        Scheduled Meds:  ARIPiprazole  5 mg Oral Daily   Chlorhexidine Gluconate Cloth  6 each Topical Q0600   gabapentin  300 mg Oral QHS   hydrocortisone sod succinate (SOLU-CORTEF) inj  100 mg Intravenous Q8H   pantoprazole (PROTONIX) IV  40 mg Intravenous Q12H   Continuous Infusions:  cefTRIAXone (ROCEPHIN)  IV 2 g (01/02/23 1019)   dextrose 5 % and 0.9% NaCl 100 mL/hr at 01/01/23 1625   norepinephrine (LEVOPHED) Adult infusion       LOS: 2 days    Time spent: 35 minutes    Memory Heinrichs Darleen Crocker, DO [facility]  If 7PM-7AM, please contact night-coverage [email or web address] 01/02/2023, 10:45 AM

## 2023-01-03 DIAGNOSIS — D649 Anemia, unspecified: Secondary | ICD-10-CM | POA: Diagnosis not present

## 2023-01-03 DIAGNOSIS — A419 Sepsis, unspecified organism: Secondary | ICD-10-CM | POA: Diagnosis not present

## 2023-01-03 LAB — CBC
HCT: 23.3 % — ABNORMAL LOW (ref 39.0–52.0)
Hemoglobin: 7.5 g/dL — ABNORMAL LOW (ref 13.0–17.0)
MCH: 28.8 pg (ref 26.0–34.0)
MCHC: 32.2 g/dL (ref 30.0–36.0)
MCV: 89.6 fL (ref 80.0–100.0)
Platelets: 33 10*3/uL — ABNORMAL LOW (ref 150–400)
RBC: 2.6 MIL/uL — ABNORMAL LOW (ref 4.22–5.81)
RDW: 17.3 % — ABNORMAL HIGH (ref 11.5–15.5)
WBC: 10.6 10*3/uL — ABNORMAL HIGH (ref 4.0–10.5)
nRBC: 4.3 % — ABNORMAL HIGH (ref 0.0–0.2)

## 2023-01-03 LAB — COMPREHENSIVE METABOLIC PANEL
ALT: 17 U/L (ref 0–44)
AST: 43 U/L — ABNORMAL HIGH (ref 15–41)
Albumin: 2.1 g/dL — ABNORMAL LOW (ref 3.5–5.0)
Alkaline Phosphatase: 235 U/L — ABNORMAL HIGH (ref 38–126)
Anion gap: 9 (ref 5–15)
BUN: 29 mg/dL — ABNORMAL HIGH (ref 6–20)
CO2: 19 mmol/L — ABNORMAL LOW (ref 22–32)
Calcium: 8 mg/dL — ABNORMAL LOW (ref 8.9–10.3)
Chloride: 110 mmol/L (ref 98–111)
Creatinine, Ser: 1.08 mg/dL (ref 0.61–1.24)
GFR, Estimated: >60 mL/min (ref >60)
Glucose, Bld: 130 mg/dL — ABNORMAL HIGH (ref 70–99)
Potassium: 3.5 mmol/L (ref 3.5–5.1)
Sodium: 138 mmol/L (ref 135–145)
Total Bilirubin: 0.4 mg/dL (ref 0.3–1.2)
Total Protein: 5.5 g/dL — ABNORMAL LOW (ref 6.5–8.1)

## 2023-01-03 LAB — MAGNESIUM: Magnesium: 2.2 mg/dL (ref 1.7–2.4)

## 2023-01-03 MED ORDER — HYDRALAZINE HCL 20 MG/ML IJ SOLN
10.0000 mg | INTRAMUSCULAR | Status: DC | PRN
  Administered 2023-01-03: 10 mg via INTRAVENOUS
  Filled 2023-01-03: qty 1

## 2023-01-03 NOTE — Progress Notes (Signed)
PROGRESS NOTE    Barry Gibson  [id number] DOB: [birth year] DOA: 12/23/2022 PCP: Coral Spikes, DO   Brief Narrative:    Barry Gibson is a 62 y.o. male with medical history significant for hypertension, hyperlipidemia, rheumatoid arthritis, metastatic bladder cancer status post chemo/radiation, and cystectomy and ileal conduit placement who presented to the ED with complaints of fever and confusion as well as weakness.  Apparently he has been declining with worsening lethargy and poor oral intake over the last 3-4 weeks.  Patient was admitted for sepsis secondary to Serratia UTI.  He is noted to have improvement in his fever and mentation.  Diet will be advanced and PT evaluation ordered.  Transition to telemetry floor on 2/16.  Assessment & Plan:   Principal Problem:   Sepsis ([place]) Active Problems:   Hyperlipidemia   Essential hypertension, benign   Rheumatoid arthritis (HCC)   Bladder cancer (HCC)   Port-A-Cath in place   Anemia   CKD (chronic kidney disease) stage 3, GFR 30-59 ml/min (HCC)   Elevated alkaline phosphatase level   Blood in stool  Assessment and Plan:  Sepsis, POA, secondary to Serratia UTI-improving Continue on Rocephin day 4/7 Blood cultures with no growth to date  Acute metabolic encephalopathy secondary to above-improving Head CT WNL  Left neck tenderness Evaluate with CT neck 2/15, no acute findings noted   History of hypertension Blood pressures have elevated, hold further hydrocortisone Hydralazine IV as needed Can plan to resume home amlodipine and losartan by tomorrow once tolerating diet and if blood pressures continue to remain elevated Discontinue IV fluid   Metastatic bladder cancer With prior cystectomy and ileal conduit placement and mets to spine Currently on chemoradiation and follows with oncology On chronic pain regimen   Anemia/thrombocytopenia with heme pos stool Appreciate GI following with potential plan for  endoscopy once clinically stable prior to discharge Continue PPI IV BID Continue to monitor throughout the stay Follows with hematology oncology regarding this   Severe back pain secondary to above Continues on narcotics, lidocaine patch, and Voltaren gel Prednisone PT evaluation ordered, up in chair   History of rheumatoid arthritis On hydroxychloroquine, hold for now   DVT prophylaxis:SCDs Code Status: DNR Family Communication: Wife at bedside 2/16 Disposition Plan:  Status is: Inpatient Remains inpatient appropriate because: Need for IV medications.   Consultants:  GI Discussed with Oncologist Dr. Burr Medico 2/14  Procedures:  None  Antimicrobials:  Anti-infectives (From admission, onward)    Start     Dose/Rate Route Frequency Ordered Stop   01/01/23 1000  cefTRIAXone (ROCEPHIN) 2 g in sodium chloride 0.9 % 100 mL IVPB        2 g 200 mL/hr over 30 Minutes Intravenous Every 24 hours 01/01/23 0816     01/01/23 0700  cefTRIAXone (ROCEPHIN) 2 g in sodium chloride 0.9 % 100 mL IVPB  Status:  Discontinued        2 g 200 mL/hr over 30 Minutes Intravenous Every 24 hours 01/09/2023 0800 12/22/2022 1747   12/24/2022 1845  vancomycin (VANCOREADY) IVPB 1500 mg/300 mL  Status:  Discontinued        1,500 mg 150 mL/hr over 120 Minutes Intravenous  Once 12/26/2022 1750 01/08/2023 1752   12/29/2022 1845  vancomycin (VANCOREADY) IVPB 1750 mg/350 mL  Status:  Discontinued        1,750 mg 175 mL/hr over 120 Minutes Intravenous Every 24 hours 01/15/2023 1752 01/01/23 0816   12/22/2022 1750  ceFEPIme (MAXIPIME) 2 g  in sodium chloride 0.9 % 100 mL IVPB  Status:  Discontinued        2 g 200 mL/hr over 30 Minutes Intravenous Every 8 hours 12/21/2022 1750 01/01/23 0816   01/08/2023 0715  cefTRIAXone (ROCEPHIN) 2 g in sodium chloride 0.9 % 100 mL IVPB        2 g 200 mL/hr over 30 Minutes Intravenous  Once 12/29/2022 0700 12/20/2022 0810       Subjective: Patient seen and evaluated today and is more awake and alert  this morning with improvement in fever noted.  No other acute overnight events noted. Good urine output in urostomy bag that appears to be more clear.  Objective: Vitals:   01/03/23 0742 01/03/23 0800 01/03/23 0857 01/03/23 0857  BP: (!) 174/108 (!) 159/88 (!) 144/82 (!) 144/82  Pulse: (!) 107 (!) 110 (!) 112 (!) 112  Resp: 18 19 20 20  $ Temp: 98.6 F (37 C)  98.6 F (37 C)   TempSrc: Oral     SpO2: 92% 91%  92%  Weight:      Height:        Intake/Output Summary (Last 24 hours) at 01/03/2023 1203 Last data filed at 01/03/2023 0555 Gross per 24 hour  Intake --  Output 950 ml  Net -950 ml   Filed Weights   12/28/2022 0846  Weight: 83.5 kg    Examination:  General exam: Appears more alert and awake and responding to questions appropriately Respiratory system: Clear to auscultation. Respiratory effort normal.  Nasal cannula oxygen. Cardiovascular system: S1 & S2 heard, RRR.  Gastrointestinal system: Abdomen is soft.  Urostomy with dark urine output Central nervous system: awake/alert Extremities: No edema Skin: No significant lesions noted    Data Reviewed: I have personally reviewed following labs and imaging studies  CBC: Recent Labs  Lab 12/30/22 1200 12/30/22 1200 12/30/22 1347 12/23/2022 0535 01/01/23 0435 01/01/23 1437 01/01/23 2047 01/02/23 0305 01/03/23 0446  WBC 5.9  --   --  7.1 11.1*  --   --  10.4 10.6*  NEUTROABS 3.5  --   --  5.1  --   --   --   --   --   HGB 7.7*   < >  --  9.3* 8.8* 8.2* 8.3* 7.7* 7.5*  HCT 23.5*  --   --  29.4* 27.3* 25.5* 25.6* 23.5* 23.3*  MCV 89.4  --   --  90.2 90.4  --   --  89.0 89.6  PLT 45*  --  46* 40* 39*  --   --  37* 33*   < > = values in this interval not displayed.   Basic Metabolic Panel: Recent Labs  Lab 12/30/22 1200 01/10/2023 0535 01/01/23 0435 01/02/23 0305 01/03/23 0446  NA 136 133* 133* 135 138  K 3.6 4.2 4.7 4.1 3.5  CL 104 103 104 106 110  CO2 23 20* 18* 19* 19*  GLUCOSE 88 85 75 147* 130*  BUN  23* 25* 27* 27* 29*  CREATININE 1.24 1.30* 1.33* 1.11 1.08  CALCIUM 8.5* 8.3* 8.1* 8.0* 8.0*  MG  --   --  1.4* 2.0 2.2   GFR: Estimated Creatinine Clearance: 77.5 mL/min (by C-G formula based on SCr of 1.08 mg/dL). Liver Function Tests: Recent Labs  Lab 12/30/22 1200 01/03/2023 0535 01/01/23 0435 01/02/23 0305 01/03/23 0446  AST 29 42* 34 39 43*  ALT 8 11 10 13 17  $ ALKPHOS 349* 333* 261* 249* 235*  BILITOT  0.5 0.8 1.2 0.7 0.4  PROT 5.6* 5.9* 5.2* 5.3* 5.5*  ALBUMIN 3.1* 2.8* 2.4* 2.2* 2.1*   No results for input(s): "LIPASE", "AMYLASE" in the last 168 hours. No results for input(s): "AMMONIA" in the last 168 hours. Coagulation Profile: Recent Labs  Lab 12/30/22 1347 01/13/2023 0535  INR 1.2 1.2   Cardiac Enzymes: No results for input(s): "CKTOTAL", "CKMB", "CKMBINDEX", "TROPONINI" in the last 168 hours. BNP (last 3 results) No results for input(s): "PROBNP" in the last 8760 hours. HbA1C: No results for input(s): "HGBA1C" in the last 72 hours. CBG: No results for input(s): "GLUCAP" in the last 168 hours. Lipid Profile: No results for input(s): "CHOL", "HDL", "LDLCALC", "TRIG", "CHOLHDL", "LDLDIRECT" in the last 72 hours. Thyroid Function Tests: No results for input(s): "TSH", "T4TOTAL", "FREET4", "T3FREE", "THYROIDAB" in the last 72 hours. Anemia Panel: No results for input(s): "VITAMINB12", "FOLATE", "FERRITIN", "TIBC", "IRON", "RETICCTPCT" in the last 72 hours. Sepsis Labs: Recent Labs  Lab 01/07/2023 0535 12/23/2022 1806  LATICACIDVEN 0.9 0.9    Recent Results (from the past 240 hour(s))  Urine Culture     Status: Abnormal   Collection Time: 12/30/22 12:00 PM   Specimen: Urine, Clean Catch  Result Value Ref Range Status   Specimen Description   Final    URINE, CLEAN CATCH Performed at [facility], [address], [place], [place] [address]    Special Requests   Final    URINE, CLEAN CATCH Performed at [facility], [place] [address], [place], [place] [address]    Culture >=100,000 COLONIES/mL SERRATIA MARCESCENS (A)  Final   Report Status 01/01/2023 FINAL  Final   Organism ID, Bacteria SERRATIA MARCESCENS (A)  Final      Susceptibility   Serratia marcescens - MIC*    CEFEPIME <=0.12 SENSITIVE Sensitive     CEFTRIAXONE <=0.25 SENSITIVE Sensitive     CIPROFLOXACIN <=0.25 SENSITIVE Sensitive     GENTAMICIN <=1 SENSITIVE Sensitive     NITROFURANTOIN 128 RESISTANT Resistant     TRIMETH/SULFA <=20 SENSITIVE Sensitive     * >=100,000 COLONIES/mL SERRATIA MARCESCENS  Blood Culture (routine x 2)     Status: None (Preliminary result)   Collection Time: 12/19/2022  5:35 AM   Specimen: BLOOD  Result Value Ref Range Status   Specimen Description BLOOD RIGHT ASSIST CONTROL  Final   Special Requests   Final    BOTTLES DRAWN AEROBIC AND ANAEROBIC Blood Culture results may not be optimal due to an excessive volume of blood received in culture bottles   Culture   Final    NO GROWTH 2 DAYS Performed at [facility], [address], [place], [place] [address]    Report Status PENDING  Incomplete  Urine Culture     Status: Abnormal   Collection Time: 01/08/2023  5:35 AM   Specimen: Urine, Random  Result Value Ref Range Status   Specimen Description   Final    URINE, RANDOM Performed at [facility], [address], [place], [place] [address]    Special Requests   Final    NONE Reflexed from [id number] Performed at [facility], [address], [place], [place] [address]    Culture (A)  Final    <10,000 COLONIES/mL INSIGNIFICANT GROWTH Performed at [facility], [place] [address], [place], [place] [address]    Report Status 01/01/2023 FINAL  Final  Resp panel by RT-PCR (RSV, Flu A&B, Covid) Anterior Nasal Swab  Status: None   Collection Time: 01/14/2023  5:38 AM   Specimen: Anterior Nasal Swab  Result Value Ref Range Status   SARS Coronavirus 2 by RT PCR NEGATIVE NEGATIVE Final    Comment:  (NOTE) SARS-CoV-2 target nucleic acids are NOT DETECTED.  The SARS-CoV-2 RNA is generally detectable in upper respiratory specimens during the acute phase of infection. The lowest concentration of SARS-CoV-2 viral copies this assay can detect is 138 copies/mL. A negative result does not preclude SARS-Cov-2 infection and should not be used as the sole basis for treatment or other patient management decisions. A negative result may occur with  improper specimen collection/handling, submission of specimen other than nasopharyngeal swab, presence of viral mutation(s) within the areas targeted by this assay, and inadequate number of viral copies(<138 copies/mL). A negative result must be combined with clinical observations, patient history, and epidemiological information. The expected result is Negative.  Fact Sheet for Patients:  EntrepreneurPulse.com.au  Fact Sheet for Healthcare Providers:  IncredibleEmployment.be  This test is no t yet approved or cleared by the [place] FDA and  has been authorized for detection and/or diagnosis of SARS-CoV-2 by FDA under an Emergency Use Authorization (EUA). This EUA will remain  in effect (meaning this test can be used) for the duration of the COVID-19 declaration under Section 564(b)(1) of the Act, 21 U.S.C.section 360bbb-3(b)(1), unless the authorization is terminated  or revoked sooner.       Influenza A by PCR NEGATIVE NEGATIVE Final   Influenza B by PCR NEGATIVE NEGATIVE Final    Comment: (NOTE) The Xpert Xpress SARS-CoV-2/FLU/RSV plus assay is intended as an aid in the diagnosis of influenza from Nasopharyngeal swab specimens and should not be used as a sole basis for treatment. Nasal washings and aspirates are unacceptable for Xpert Xpress SARS-CoV-2/FLU/RSV testing.  Fact Sheet for Patients: EntrepreneurPulse.com.au  Fact Sheet for Healthcare  Providers: IncredibleEmployment.be  This test is not yet approved or cleared by the [place] FDA and has been authorized for detection and/or diagnosis of SARS-CoV-2 by FDA under an Emergency Use Authorization (EUA). This EUA will remain in effect (meaning this test can be used) for the duration of the COVID-19 declaration under Section 564(b)(1) of the Act, 21 U.S.C. section 360bbb-3(b)(1), unless the authorization is terminated or revoked.     Resp Syncytial Virus by PCR NEGATIVE NEGATIVE Final    Comment: (NOTE) Fact Sheet for Patients: EntrepreneurPulse.com.au  Fact Sheet for Healthcare Providers: IncredibleEmployment.be  This test is not yet approved or cleared by the [place] FDA and has been authorized for detection and/or diagnosis of SARS-CoV-2 by FDA under an Emergency Use Authorization (EUA). This EUA will remain in effect (meaning this test can be used) for the duration of the COVID-19 declaration under Section 564(b)(1) of the Act, 21 U.S.C. section 360bbb-3(b)(1), unless the authorization is terminated or revoked.  Performed at [facility], [address] [place], [place] [address]   Blood Culture (routine x 2)     Status: None (Preliminary result)   Collection Time: 01/03/2023  5:57 AM   Specimen: BLOOD  Result Value Ref Range Status   Specimen Description BLOOD port  Final   Special Requests   Final    BOTTLES DRAWN AEROBIC AND ANAEROBIC Blood Culture results may not be optimal due to an excessive volume of blood received in culture bottles   Culture   Final    NO GROWTH 2 DAYS Performed at [facility], [address] [place], [place] [address]  Report Status PENDING  Incomplete  MRSA Next Gen by PCR, Nasal     Status: None   Collection Time: 01/10/2023  8:50 AM   Specimen: Nasal Mucosa; Nasal Swab  Result Value Ref Range Status   MRSA by PCR Next Gen NOT DETECTED NOT DETECTED Final     Comment: (NOTE) The GeneXpert MRSA Assay (FDA approved for NASAL specimens only), is one component of a comprehensive MRSA colonization surveillance program. It is not intended to diagnose MRSA infection nor to guide or monitor treatment for MRSA infections. Test performance is not FDA approved in patients less than 51 years old. Performed at [facility], [address] [place], [place] [address]          Radiology Studies: CT SOFT TISSUE NECK W CONTRAST  Result Date: 01/02/2023 CLINICAL DATA:  Soft tissue infection suspected. Possible left-sided neck mass. EXAM: CT NECK WITH CONTRAST TECHNIQUE: Multidetector CT imaging of the neck was performed using the standard protocol following the bolus administration of intravenous contrast. RADIATION DOSE REDUCTION: This exam was performed according to the departmental dose-optimization program which includes automated exposure control, adjustment of the mA and/or kV according to patient size and/or use of iterative reconstruction technique. CONTRAST:  76m OMNIPAQUE IOHEXOL 300 MG/ML  SOLN COMPARISON:  CT chest/abdomen/pelvis 11/21/2022. FINDINGS: Pharynx and larynx: No mass or edema. Salivary glands: No inflammation, mass, or stone. Thyroid: Normal. Lymph nodes: No suspicious cervical lymph nodes. Vascular: Atherosclerotic calcifications of the carotid bulbs. Limited intracranial: Unremarkable. Visualized orbits: Unremarkable. Mastoids and visualized paranasal sinuses: Right mastoid middle ear effusion. No nasopharyngeal mass. Severe mucosal disease in the bilateral frontal sinuses and anterior ethmoid air cells. Skeleton: Redemonstrated diffuse sclerotic metastases. Upper chest: Moderate bilateral pleural effusions. Partially visualized right chest Port-A-Cath. Other: No mass or fluid collection in the left neck soft tissues. IMPRESSION: 1. No mass or fluid collection in the left neck soft tissues. 2. Redemonstrated diffuse sclerotic metastases. 3.  Moderate bilateral pleural effusions. 4. Severe mucosal disease in the bilateral frontal sinuses and anterior ethmoid air cells. Correlate with signs/symptoms of sinusitis. Electronically Signed   By: WEmmit AlexandersM.D.   On: 01/02/2023 16:58        Scheduled Meds:  ARIPiprazole  5 mg Oral Daily   Chlorhexidine Gluconate Cloth  6 each Topical Q0600   gabapentin  300 mg Oral QHS   pantoprazole (PROTONIX) IV  40 mg Intravenous Q12H   Continuous Infusions:  cefTRIAXone (ROCEPHIN)  IV 2 g (01/03/23 0940)     LOS: 3 days    Time spent: 35 minutes    Britt Petroni DDarleen Crocker DO [facility]  If 7PM-7AM, please contact night-coverage [email or web address] 01/03/2023, 12:03 PM

## 2023-01-03 NOTE — Progress Notes (Signed)
Gastroenterology Progress Note   Referring Provider: No ref. provider found Primary Care Physician:  Coral Spikes, DO Primary Gastroenterologist:  Dr. Eusebio Friendly [facility])  Patient ID: Barry Gibson; [id number]; 1961-01-05    Subjective   Only complaint today is back pain. No N/V. More alert and wife reports better conversations but still with some visual hallucinations. Better toleration of diet.    Objective   Vital signs in last 24 hours Temp:  [97.6 F (36.4 C)-99.3 F (37.4 C)] 98.6 F (37 C) (02/16 0857) Pulse Rate:  [100-117] 112 (02/16 0857) Resp:  [14-21] 20 (02/16 0857) BP: (127-174)/(82-108) 144/82 (02/16 0857) SpO2:  [90 %-94 %] 92 % (02/16 0857) Last BM Date : 01/01/23  Physical Exam General:   Alert and pleasant. Chronically ill appearing. Responding to questions Head:  Normocephalic and atraumatic. Eyes:  No icterus, sclera clear. Conjuctiva pink.  Mouth:  Without lesions, mucosa pink and moist.  Heart:  S1, S2 present, no murmurs noted.  Lungs, Chest: Clear to auscultation bilaterally, without wheezing, rales, or rhonchi. Port in place to right chest.  Abdomen:  Bowel sounds present, soft, non-tender, non-distended. No HSM or hernias noted. No rebound or guarding. No masses appreciated  Extremities:  Without clubbing or edema. Neurologic:  Alert. Following commands. Answering questions appropriately.  Skin:  Warm and dry, intact without significant lesions.  Psych:  Alert and cooperative.   Intake/Output from previous day: 02/15 0701 - 02/16 0700 In: 60 [P.O.:60] Out: 1100 [Urine:1100] Intake/Output this shift: No intake/output data recorded.  Lab Results  Recent Labs    01/01/23 0435 01/01/23 1437 01/01/23 2047 01/02/23 0305 01/03/23 0446  WBC 11.1*  --   --  10.4 10.6*  HGB 8.8*   < > 8.3* 7.7* 7.5*  HCT 27.3*   < > 25.6* 23.5* 23.3*  PLT 39*  --   --  37* 33*   < > = values in this interval not displayed.   BMET Recent Labs     01/01/23 0435 01/02/23 0305 01/03/23 0446  NA 133* 135 138  K 4.7 4.1 3.5  CL 104 106 110  CO2 18* 19* 19*  GLUCOSE 75 147* 130*  BUN 27* 27* 29*  CREATININE 1.33* 1.11 1.08  CALCIUM 8.1* 8.0* 8.0*   LFT Recent Labs    01/01/23 0435 01/02/23 0305 01/03/23 0446  PROT 5.2* 5.3* 5.5*  ALBUMIN 2.4* 2.2* 2.1*  AST 34 39 43*  ALT 10 13 17  $ ALKPHOS 261* 249* 235*  BILITOT 1.2 0.7 0.4   PT/INR No results for input(s): "LABPROT", "INR" in the last 72 hours. Hepatitis Panel No results for input(s): "HEPBSAG", "HCVAB", "HEPAIGM", "HEPBIGM" in the last 72 hours.   Studies/Results CT SOFT TISSUE NECK W CONTRAST  Result Date: 01/02/2023 CLINICAL DATA:  Soft tissue infection suspected. Possible left-sided neck mass. EXAM: CT NECK WITH CONTRAST TECHNIQUE: Multidetector CT imaging of the neck was performed using the standard protocol following the bolus administration of intravenous contrast. RADIATION DOSE REDUCTION: This exam was performed according to the departmental dose-optimization program which includes automated exposure control, adjustment of the mA and/or kV according to patient size and/or use of iterative reconstruction technique. CONTRAST:  71m OMNIPAQUE IOHEXOL 300 MG/ML  SOLN COMPARISON:  CT chest/abdomen/pelvis 11/21/2022. FINDINGS: Pharynx and larynx: No mass or edema. Salivary glands: No inflammation, mass, or stone. Thyroid: Normal. Lymph nodes: No suspicious cervical lymph nodes. Vascular: Atherosclerotic calcifications of the carotid bulbs. Limited intracranial: Unremarkable. Visualized orbits: Unremarkable. Mastoids  and visualized paranasal sinuses: Right mastoid middle ear effusion. No nasopharyngeal mass. Severe mucosal disease in the bilateral frontal sinuses and anterior ethmoid air cells. Skeleton: Redemonstrated diffuse sclerotic metastases. Upper chest: Moderate bilateral pleural effusions. Partially visualized right chest Port-A-Cath. Other: No mass or fluid  collection in the left neck soft tissues. IMPRESSION: 1. No mass or fluid collection in the left neck soft tissues. 2. Redemonstrated diffuse sclerotic metastases. 3. Moderate bilateral pleural effusions. 4. Severe mucosal disease in the bilateral frontal sinuses and anterior ethmoid air cells. Correlate with signs/symptoms of sinusitis. Electronically Signed   By: Emmit Alexanders M.D.   On: 01/02/2023 16:58   CT HEAD WO CONTRAST (5MM)  Result Date: 01/01/2023 CLINICAL DATA:  Mental status change, unknown cause EXAM: CT HEAD WITHOUT CONTRAST TECHNIQUE: Contiguous axial images were obtained from the base of the skull through the vertex without intravenous contrast. RADIATION DOSE REDUCTION: This exam was performed according to the departmental dose-optimization program which includes automated exposure control, adjustment of the mA and/or kV according to patient size and/or use of iterative reconstruction technique. COMPARISON:  None Available. FINDINGS: Brain: No evidence of acute large vascular territory infarct, acute hemorrhage, intraparenchymal mass lesion, midline shift or hydrocephalus. 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 Vascular: No hyperdense vessel identified. Skull: No acute fracture. Sinuses/Orbits: Paranasal sinus mucosal thickening. No acute orbital findings. Other: Small right mastoid effusion with middle ear fluid. IMPRESSION: 1. No evidence of acute intracranial abnormality. 2. 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 Electronically Signed   By: Margaretha Sheffield M.D.   On: 01/01/2023 11:29   DG Chest Port 1 View  Result Date: 01/06/2023 CLINICAL DATA:  Questionable sepsis EXAM: PORTABLE CHEST 1 VIEW COMPARISON:  12/13/2022 FINDINGS: Low volume chest. Hazy appearance of the bases. No suspected edema.  No pneumothorax. Porta catheter on the right with tip at the right atrium. IMPRESSION: Hazy appearance at the bases, cannot exclude pneumonia in this clinical setting. Electronically Signed   By: Jorje Guild M.D.   On: 01/04/2023 06:09   DG Chest Port 1 View  Result Date: 12/13/2022 CLINICAL DATA:  Hypotension EXAM: PORTABLE CHEST 1 VIEW COMPARISON:  09/16/2022 FINDINGS: Right-sided chest port remains in place. Normal heart size. No focal airspace consolidation, pleural effusion, or pneumothorax. Known osseous metastatic disease, better seen on cross-sectional imaging. IMPRESSION: No active disease. Electronically Signed   By: Davina Poke D.O.   On: 12/13/2022 16:24   MR THORACIC SPINE W WO CONTRAST  Result Date: 12/11/2022 CLINICAL DATA:  Malignant neoplasm of urinary bladder, unspecified site ([place]) C67.9 (ICD-10-CM). Metastatic disease evaluation. EXAM: MRI THORACIC AND LUMBAR SPINE WITHOUT AND WITH CONTRAST TECHNIQUE: Multiplanar and multiecho pulse sequences of the thoracic and lumbar spine were obtained without and with intravenous contrast. CONTRAST:  45m GADAVIST GADOBUTROL 1 MMOL/ML IV SOLN COMPARISON:  CT chest abdomen and pelvis November 21, 2022. FINDINGS: MRI THORACIC SPINE FINDINGS Alignment:  Physiologic. Vertebrae: Diffuse T1 hypointensity throughout the thoracic vertebrae and visualized portion of the ribs with heterogeneous signal on T2 and STIR and heterogeneous diffuse contrast enhancement, consistent with diffuse metastatic disease to the bones. T1 localizer images of the cervical spine also show diffuse T1 signal abnormality. There is no associated fracture or retropulsion. Cord:  Normal signal and morphology. Paraspinal and other soft tissues: Cholelithiasis. Minimal bilateral pleural effusions.  Disc levels: Tiny posterior disc protrusions are seen at T5-6, T6-7 and T7-8. No significant spinal canal or neural foraminal stenosis at any level. MRI LUMBAR SPINE FINDINGS  Segmentation:  Standard. Alignment:  Physiologic. Vertebrae: Diffuse T1 hypointensity throughout the lumbar spine, sacral and visualized pelvic bones with heterogeneous signal on T2 and STIR and heterogeneous diffuse contrast enhancement, consistent with diffuse metastatic disease to the bones. There is no associated fracture or retropulsion. Conus medullaris: Extends to the L1 level and appears normal. Paraspinal and other soft tissues: Stable 4 cm infrarenal aortic aneurysm. Disc levels: T12-L1, L1-2 and L2-3: No spinal canal or neural foraminal stenosis. L3-4: Disc bulge and mild facet degenerative changes with ligamentum flavum redundancy resulting in mild spinal canal stenosis with moderate narrowing of the bilateral subarticular zones and moderate bilateral neural foraminal narrowing. L4-5: Disc bulge, mild-to-moderate facet degenerative changes and mild ligamentum flavum redundancy resulting in mild spinal canal stenosis with mild narrowing of the bilateral subarticular zones, moderate right and mild left neural foraminal narrowing. L5-S1: Left central disc protrusion and mild facet degenerative changes resulting in moderate narrowing of the left subarticular zone, mild right and moderate left neural foraminal narrowing. IMPRESSION: 1. Diffuse osseous metastatic disease to the spine, ribs, sacrum and visualized pelvic bones. No associated fracture or retropulsion. 2. Degenerative changes of the lumbar spine with mild spinal canal stenosis at L3-4 and L4-5 and moderate narrowing of the bilateral subarticular zones and moderate bilateral neural foraminal narrowing at L3-4. 3. Moderate narrowing of the left subarticular zone and moderate left neural foraminal narrowing at L5-S1. 4. Cholelithiasis. 5. Stable 4 cm infrarenal aortic aneurysm. Electronically Signed   By: Pedro Earls M.D.   On: 12/11/2022 09:20   MR Lumbar Spine W Wo Contrast  Result Date: 12/11/2022 CLINICAL DATA:  Malignant  neoplasm of urinary bladder, unspecified site ([place]) C67.9 (ICD-10-CM). Metastatic disease evaluation. EXAM: MRI THORACIC AND LUMBAR SPINE WITHOUT AND WITH CONTRAST TECHNIQUE: Multiplanar and multiecho pulse sequences of the thoracic and lumbar spine were obtained without and with intravenous contrast. CONTRAST:  30m GADAVIST GADOBUTROL 1 MMOL/ML IV SOLN COMPARISON:  CT chest abdomen and pelvis November 21, 2022. FINDINGS: MRI THORACIC SPINE FINDINGS Alignment:  Physiologic. Vertebrae: Diffuse T1 hypointensity throughout the thoracic vertebrae and visualized portion of the ribs with heterogeneous signal on T2 and STIR and heterogeneous diffuse contrast enhancement, consistent with diffuse metastatic disease to the bones. T1 localizer images of the cervical spine also show diffuse T1 signal abnormality. There is no associated fracture or retropulsion. Cord:  Normal signal and morphology. Paraspinal and other soft tissues: Cholelithiasis. Minimal bilateral pleural effusions. Disc levels: Tiny posterior disc protrusions are seen at T5-6, T6-7 and T7-8. No significant spinal canal or neural foraminal stenosis at any level. MRI LUMBAR SPINE FINDINGS Segmentation:  Standard. Alignment:  Physiologic. Vertebrae: Diffuse T1 hypointensity throughout the lumbar spine, sacral and visualized pelvic bones with heterogeneous signal on T2 and STIR and heterogeneous diffuse contrast enhancement, consistent with diffuse metastatic disease to the bones. There is no associated fracture or retropulsion. Conus medullaris: Extends to the L1 level and appears normal. Paraspinal and other soft tissues: Stable 4 cm infrarenal aortic aneurysm. Disc levels: T12-L1, L1-2 and L2-3: No spinal canal or neural foraminal stenosis. L3-4: Disc bulge and mild facet degenerative changes with ligamentum flavum redundancy resulting in mild spinal canal stenosis with moderate narrowing of the bilateral subarticular zones and moderate bilateral neural  foraminal narrowing. L4-5: Disc bulge, mild-to-moderate facet degenerative changes and mild  ligamentum flavum redundancy resulting in mild spinal canal stenosis with mild narrowing of the bilateral subarticular zones, moderate right and mild left neural foraminal narrowing. L5-S1: Left central disc protrusion and mild facet degenerative changes resulting in moderate narrowing of the left subarticular zone, mild right and moderate left neural foraminal narrowing. IMPRESSION: 1. Diffuse osseous metastatic disease to the spine, ribs, sacrum and visualized pelvic bones. No associated fracture or retropulsion. 2. Degenerative changes of the lumbar spine with mild spinal canal stenosis at L3-4 and L4-5 and moderate narrowing of the bilateral subarticular zones and moderate bilateral neural foraminal narrowing at L3-4. 3. Moderate narrowing of the left subarticular zone and moderate left neural foraminal narrowing at L5-S1. 4. Cholelithiasis. 5. Stable 4 cm infrarenal aortic aneurysm. Electronically Signed   By: Pedro Earls M.D.   On: 12/11/2022 09:20    Assessment  62 y.o. 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 He was admitted with urosepsis and with worsening anemia and thrombocytopenia throughout admission with need for blood transfusion. GI consulted for further evaluation of anemia and heme positive stool.   Anemia, heme positive stool: Noted to have anemia/thrombocytopenia dating back 6 weeks with further declines and noted to be symptomatic at outpatient oncology visit 12/30/22. He required 1u PRBC for Hgb 7.7 with improvement to 9.3 on 12/23/2022. Has had slow drift with Hgb 7.5 today. Platelets down to 33 from 37. No overt GI bleeding. Continue to monitor for  now. Likely secondary to frequent NSAID use given 600 mg ibuprofen 3-4 times a day for months although maintained on omeprazole 40 mg once daily at home. EGD unlikely to change treatment however may still consider EGD prior to discharge or sooner if any over bleeding occurs. Avoid NSAIDs and continue PPI BID. Transfuse as needed.   Left neck mass: Reportedly tender to palpation yesterday. CT neck without fluid collection or mass in left neck soft tissues. Re demonstrated diffuse sclerotic metastases and moderate bilateral pleural effusions.   Elevated alk phos: Recent GGT normal. Likely elevated in the setting of known bone metastasis. Does have history of gallstones from CT abdomen in O[place]ober 2023 howver unlikely cause and is currently down trending.   Plan / Recommendations  PPI IV BID Continue to trend H/H and platelets, transfuse for Hgb <7.  Monitor for overt GI bleeding Avoid NSAIDs May consider EGD prior to discharge pending clinical course.     LOS: 3 days    01/03/2023, 9:30 AM   Venetia Night, MSN, FNP-BC, AGACNP-BC [facility]

## 2023-01-03 NOTE — Progress Notes (Signed)
Will recheck VS   01/03/23 0857  Assess: MEWS Score  BP (!) 144/82  MAP (mmHg) 101  Pulse Rate (!) 112  Resp 20  Level of Consciousness Alert  SpO2 92 %  Assess: MEWS Score  MEWS Temp 0  MEWS Systolic 0  MEWS Pulse 2  MEWS RR 0  MEWS LOC 0  MEWS Score 2  MEWS Score Color Yellow  Assess: if the MEWS score is Yellow or Red  Were vital signs taken at a resting state? Yes  Focused Assessment No change from prior assessment  Does the patient meet 2 or more of the SIRS criteria? No  Notify: Charge Nurse/RN  Name of Charge Nurse/RN Notified Brandi , RN  Assess: SIRS CRITERIA  SIRS Temperature  0  SIRS Pulse 1  SIRS Respirations  0  SIRS WBC 0  SIRS Score Sum  1

## 2023-01-04 ENCOUNTER — Inpatient Hospital Stay (HOSPITAL_COMMUNITY): Payer: BC Managed Care – PPO

## 2023-01-04 DIAGNOSIS — I10 Essential (primary) hypertension: Secondary | ICD-10-CM

## 2023-01-04 DIAGNOSIS — N183 Chronic kidney disease, stage 3 unspecified: Secondary | ICD-10-CM | POA: Diagnosis not present

## 2023-01-04 DIAGNOSIS — C670 Malignant neoplasm of trigone of bladder: Secondary | ICD-10-CM | POA: Diagnosis not present

## 2023-01-04 DIAGNOSIS — Z95828 Presence of other vascular implants and grafts: Secondary | ICD-10-CM

## 2023-01-04 LAB — BLOOD GAS, ARTERIAL
Acid-base deficit: 3.7 mmol/L — ABNORMAL HIGH (ref 0.0–2.0)
Bicarbonate: 16.7 mmol/L — ABNORMAL LOW (ref 20.0–28.0)
Drawn by: 10555
FIO2: 28 %
O2 Saturation: 93.4 %
Patient temperature: 38.9
pCO2 arterial: 22 mmHg — ABNORMAL LOW (ref 32–48)
pH, Arterial: 7.5 — ABNORMAL HIGH (ref 7.35–7.45)
pO2, Arterial: 71 mmHg — ABNORMAL LOW (ref 83–108)

## 2023-01-04 LAB — COMPREHENSIVE METABOLIC PANEL
ALT: 14 U/L (ref 0–44)
ALT: 13 U/L (ref 0–44)
AST: 32 U/L (ref 15–41)
AST: 41 U/L (ref 15–41)
Albumin: 2.1 g/dL — ABNORMAL LOW (ref 3.5–5.0)
Albumin: 2.2 g/dL — ABNORMAL LOW (ref 3.5–5.0)
Alkaline Phosphatase: 216 U/L — ABNORMAL HIGH (ref 38–126)
Alkaline Phosphatase: 232 U/L — ABNORMAL HIGH (ref 38–126)
Anion gap: 9 (ref 5–15)
Anion gap: 11 (ref 5–15)
BUN: 30 mg/dL — ABNORMAL HIGH (ref 6–20)
BUN: 31 mg/dL — ABNORMAL HIGH (ref 6–20)
CO2: 20 mmol/L — ABNORMAL LOW (ref 22–32)
CO2: 18 mmol/L — ABNORMAL LOW (ref 22–32)
Calcium: 8.2 mg/dL — ABNORMAL LOW (ref 8.9–10.3)
Calcium: 8.2 mg/dL — ABNORMAL LOW (ref 8.9–10.3)
Chloride: 113 mmol/L — ABNORMAL HIGH (ref 98–111)
Chloride: 112 mmol/L — ABNORMAL HIGH (ref 98–111)
Creatinine, Ser: 1.1 mg/dL (ref 0.61–1.24)
Creatinine, Ser: 1.26 mg/dL — ABNORMAL HIGH (ref 0.61–1.24)
GFR, Estimated: >60 mL/min (ref >60)
GFR, Estimated: >60 mL/min (ref >60)
Glucose, Bld: 97 mg/dL (ref 70–99)
Glucose, Bld: 94 mg/dL (ref 70–99)
Potassium: 3.2 mmol/L — ABNORMAL LOW (ref 3.5–5.1)
Potassium: 4.2 mmol/L (ref 3.5–5.1)
Sodium: 142 mmol/L (ref 135–145)
Sodium: 141 mmol/L (ref 135–145)
Total Bilirubin: 0.7 mg/dL (ref 0.3–1.2)
Total Bilirubin: 0.9 mg/dL (ref 0.3–1.2)
Total Protein: 5.4 g/dL — ABNORMAL LOW (ref 6.5–8.1)
Total Protein: 5.7 g/dL — ABNORMAL LOW (ref 6.5–8.1)

## 2023-01-04 LAB — CBC
HCT: 24.5 % — ABNORMAL LOW (ref 39.0–52.0)
HCT: 26.3 % — ABNORMAL LOW (ref 39.0–52.0)
Hemoglobin: 7.9 g/dL — ABNORMAL LOW (ref 13.0–17.0)
Hemoglobin: 8.2 g/dL — ABNORMAL LOW (ref 13.0–17.0)
MCH: 29 pg (ref 26.0–34.0)
MCH: 29 pg (ref 26.0–34.0)
MCHC: 32.2 g/dL (ref 30.0–36.0)
MCHC: 31.2 g/dL (ref 30.0–36.0)
MCV: 90.1 fL (ref 80.0–100.0)
MCV: 92.9 fL (ref 80.0–100.0)
Platelets: 28 10*3/uL — CL (ref 150–400)
Platelets: 32 10*3/uL — ABNORMAL LOW (ref 150–400)
RBC: 2.72 MIL/uL — ABNORMAL LOW (ref 4.22–5.81)
RBC: 2.83 MIL/uL — ABNORMAL LOW (ref 4.22–5.81)
RDW: 18.1 % — ABNORMAL HIGH (ref 11.5–15.5)
RDW: 18.2 % — ABNORMAL HIGH (ref 11.5–15.5)
WBC: 7.3 10*3/uL (ref 4.0–10.5)
WBC: 6.2 10*3/uL (ref 4.0–10.5)
nRBC: 3.4 % — ABNORMAL HIGH (ref 0.0–0.2)
nRBC: 4.3 % — ABNORMAL HIGH (ref 0.0–0.2)

## 2023-01-04 LAB — MRSA NEXT GEN BY PCR, NASAL: MRSA by PCR Next Gen: NOT DETECTED

## 2023-01-04 LAB — LACTIC ACID, PLASMA: Lactic Acid, Venous: 2.4 mmol/L (ref 0.5–1.9)

## 2023-01-04 LAB — TROPONIN I (HIGH SENSITIVITY)
Troponin I (High Sensitivity): 6 ng/L (ref <18)
Troponin I (High Sensitivity): 6 ng/L (ref <18)

## 2023-01-04 LAB — MAGNESIUM
Magnesium: 2 mg/dL (ref 1.7–2.4)
Magnesium: 1.8 mg/dL (ref 1.7–2.4)

## 2023-01-04 MED ORDER — MORPHINE SULFATE (PF) 2 MG/ML IV SOLN
2.0000 mg | Freq: Once | INTRAVENOUS | Status: DC
  Filled 2023-01-04: qty 1

## 2023-01-04 MED ORDER — HYDROMORPHONE HCL 1 MG/ML IJ SOLN
0.5000 mg | INTRAMUSCULAR | Status: DC | PRN
  Administered 2023-01-04: 0.5 mg via INTRAVENOUS
  Filled 2023-01-04 (×2): qty 0.5

## 2023-01-04 MED ORDER — POTASSIUM CHLORIDE CRYS ER 20 MEQ PO TBCR
40.0000 meq | EXTENDED_RELEASE_TABLET | ORAL | Status: AC
  Administered 2023-01-04 (×2): 40 meq via ORAL
  Filled 2023-01-04 (×2): qty 2

## 2023-01-04 MED ORDER — SODIUM CHLORIDE 0.9 % IV BOLUS
500.0000 mL | Freq: Once | INTRAVENOUS | Status: AC
  Administered 2023-01-04: 500 mL via INTRAVENOUS

## 2023-01-04 MED ORDER — METOPROLOL TARTRATE 25 MG PO TABS
25.0000 mg | ORAL_TABLET | Freq: Two times a day (BID) | ORAL | Status: DC
  Administered 2023-01-04: 25 mg via ORAL
  Filled 2023-01-04: qty 1

## 2023-01-04 MED ORDER — SODIUM CHLORIDE 0.9 % IV SOLN: INTRAVENOUS | Status: AC

## 2023-01-04 MED ORDER — LORAZEPAM 2 MG/ML IJ SOLN
1.0000 mg | Freq: Four times a day (QID) | INTRAMUSCULAR | Status: DC | PRN
  Administered 2023-01-04 – 2023-01-05 (×2): 1 mg via INTRAVENOUS
  Filled 2023-01-04 (×3): qty 1

## 2023-01-04 NOTE — Progress Notes (Signed)
Called to pt's room by assigned nurse. Pt initially with c/o chest pain and HR noted to be in 120's. EKG performed, ST. Low grade temp noted. Pt awake, alert, but confused, oriented to person only. Skin warm and dry, color appropriate, cap refill < 3 sec. Lungs clear but diminished in bases, occasional cough, non-productive. HR RRR, apically 126/min. Abd soft, bowel sounds x4. When asked where he is hurting, pt states, "My stomach hurts!" Pt does grimace to palpation of abd. Ordered PRN pain med admin'd by assigned nurse. MD Courage paged via Shea Evans for pt condition and c/o.

## 2023-01-04 NOTE — Progress Notes (Signed)
Patient had a hr of 128 on the monitor. This nurse asked the patient if he had any chest pain and he pointed towards the center of his chest. Vitals were taken and an EKG was completed. MD Emokpae was notified.

## 2023-01-04 NOTE — Progress Notes (Addendum)
   01/04/23 1453  Vitals  Temp 98.2 F (36.8 C)  Temp Source Oral  BP 109/77  MAP (mmHg) 87  Pulse Rate (!) 121  Resp (!) 26  MEWS COLOR  MEWS Score Color Red  Oxygen Therapy  SpO2 92 %  O2 Device Nasal Cannula  O2 Flow Rate (L/min) 2 L/min  MEWS Score  MEWS Temp 0  MEWS Systolic 0  MEWS Pulse 2  MEWS RR 2  MEWS LOC 0  MEWS Score 4  Provider Notification  Provider Name/Title MD Emokpae  Date Provider Notified 01/04/23  Time Provider Notified 1457  Method of Notification Page  Notification Reason Other (Comment) (red mews)   MD Emokpae notified. Discussed with MD Emokpae about possibly transferring to ICU due to patient's appearance and vitals. MD Emokpae ordered a 500 mL bolus.

## 2023-01-04 NOTE — Progress Notes (Signed)
Patients family called out to the nurses station because patient would not swallow the ice cream that they placed in his mouth. This nurse attempted to get patient to swallow. Patient was not following commands. Charge nurse Crystal was called in the room to assess patient and attempt to get patient to swallow. Patient did not have anymore ice cream visible in his mouth. Right side of lungs sound wet. MD Emokpae notified.

## 2023-01-04 NOTE — Progress Notes (Addendum)
PROGRESS NOTE    Barry Gibson  [id number] DOB: 29-Apr-1961 DOA: 01/07/2023 PCP: Coral Spikes, DO   Brief Narrative:    Barry Gibson is a 62 y.o. male with medical history significant for hypertension, hyperlipidemia, rheumatoid arthritis, metastatic bladder cancer status post chemo/radiation, and cystectomy and ileal conduit placement who presented to the ED with complaints of fever and confusion as well as weakness.  Apparently he has been declining with worsening lethargy and poor oral intake over the last 3-4 weeks.  Patient was admitted for sepsis secondary to Serratia UTI.  He is noted to have improvement in his fever and mentation.  Diet will be advanced and PT evaluation ordered.  Transition to telemetry floor on 2/16.  Assessment & Plan:   Principal Problem:   Sepsis ([place]) Active Problems:   Hyperlipidemia   Essential hypertension, benign   Rheumatoid arthritis (HCC)   Bladder cancer (HCC)   Port-A-Cath in place   Anemia   CKD (chronic kidney disease) stage 3, GFR 30-59 ml/min (HCC)   Elevated alkaline phosphatase level   Blood in stool  Assessment and Plan:  Sepsis, POA, secondary to Serratia UTI-  Continue on Rocephin day 5/7 Urine cultures with Serratia, blood cultures with no growth to date  Acute metabolic encephalopathy secondary to above-  Head CT WNL -Despite discontinuing IV Dilaudid patient continues to have episodes of lethargy  Left neck tenderness Evaluate with CT neck 2/15, no acute findings noted   History of hypertension Blood pressures have elevated, hold further hydrocortisone Hydralazine IV as needed -BP improved   Metastatic bladder cancer With prior cystectomy and ileal conduit placement and mets to spine Currently on chemoradiation and follows with oncology Dr Burr Medico On chronic pain regimen -As per Dr Burr Medico No further chemo due to progression of disease   Anemia/thrombocytopenia with heme pos stool Appreciate GI following with  potential plan for endoscopy once clinically stable prior to discharge Continue PPI IV BID Continue to monitor throughout the stay Follows with hematology oncology regarding this -Patient had 1 unit of PRBC on 12/30/2022 01/04/23 -Hgb currently above 7 -Platelet count is down to 28--no bleeding concerns at this time   Severe back pain secondary to above Continues on narcotics, lidocaine patch, and Voltaren gel Prednisone -Family would like to discontinue IV Dilaudid -They would like to use on oral oxycodone As needed   History of rheumatoid arthritis On hydroxychloroquine, hold for now -Oxycodone as needed  Social/ethics--- DNR/DNI, over prognosis is poor given progression of his metastatic bladder cancer not amenable to chemotherapy, acute infection, anemia and worsening thrombocytopenia  ?CP--- on 01/04/23 patient had chest discomfort versus epigastric discomfort, serial troponin and EKG reassuring   DVT prophylaxis:SCDs Code Status: DNR Family Communication: Wife , daughter and son at bedside 01/04/23 Disposition Plan:  Status is: Inpatient Remains inpatient appropriate because: Need for IV medications.   Consultants:  GI Discussed with Oncologist Dr. Burr Medico 2/14  Procedures:  None  Antimicrobials:  Anti-infectives (From admission, onward)    Start     Dose/Rate Route Frequency Ordered Stop   01/01/23 1000  cefTRIAXone (ROCEPHIN) 2 g in sodium chloride 0.9 % 100 mL IVPB        2 g 200 mL/hr over 30 Minutes Intravenous Every 24 hours 01/01/23 0816     01/01/23 0700  cefTRIAXone (ROCEPHIN) 2 g in sodium chloride 0.9 % 100 mL IVPB  Status:  Discontinued        2 g 200 mL/hr over 30  Minutes Intravenous Every 24 hours 12/19/2022 0800 01/15/2023 1747   01/02/2023 1845  vancomycin (VANCOREADY) IVPB 1500 mg/300 mL  Status:  Discontinued        1,500 mg 150 mL/hr over 120 Minutes Intravenous  Once 01/12/2023 1750 12/30/2022 1752   01/06/2023 1845  vancomycin (VANCOREADY) IVPB 1750 mg/350  mL  Status:  Discontinued        1,750 mg 175 mL/hr over 120 Minutes Intravenous Every 24 hours 01/09/2023 1752 01/01/23 0816   12/22/2022 1750  ceFEPIme (MAXIPIME) 2 g in sodium chloride 0.9 % 100 mL IVPB  Status:  Discontinued        2 g 200 mL/hr over 30 Minutes Intravenous Every 8 hours 12/22/2022 1750 01/01/23 0816   12/30/2022 0715  cefTRIAXone (ROCEPHIN) 2 g in sodium chloride 0.9 % 100 mL IVPB        2 g 200 mL/hr over 30 Minutes Intravenous  Once 12/24/2022 0700 12/19/2022 0810       Subjective: Wife , daughter and son at bedside 01/04/23 -on 01/04/23 patient had chest discomfort versus epigastric discomfort, serial troponin and EKG reassuring -Family requested discontinuation of IV Dilaudid to avoid excessive sleepiness  Objective: Vitals:   01/04/23 1453 01/04/23 1609 01/04/23 1700 01/04/23 1801  BP: 109/77 120/79 (!) 101/90 116/80  Pulse: (!) 121 (!) 123 (!) 124   Resp: (!) 26 (!) 30 (!) 26 (!) 28  Temp: 98.2 F (36.8 C) 98.2 F (36.8 C)    TempSrc: Oral Oral    SpO2: 92% 91% 94% 90%  Weight:      Height:        Intake/Output Summary (Last 24 hours) at 01/04/2023 1858 Last data filed at 01/04/2023 1743 Gross per 24 hour  Intake 1190.15 ml  Output 2000 ml  Net -809.85 ml   Filed Weights   12/29/2022 0846  Weight: 83.5 kg    Physical Exam  Gen:-Sleepy, able to answer yes and no questions HEENT:- [place].AT, No sclera icterus Neck-Supple Neck,No JVD,.  Lungs-  CTAB , fair air movement bilaterally  CV- S1, S2 normal, RRR, Rt sided portacath in situ Abd-  +ve B.Sounds, Abd Soft, No tenderness, urostomy bag with clearer color Extremity/Skin:- No  edema,   good pedal pulses  Psych-affect is flat disoriented Neuro-generalized weakness, no new focal deficits, no tremors  Data Reviewed: I have personally reviewed following labs and imaging studies  CBC: Recent Labs  Lab 12/30/22 1200 12/30/22 1347 01/03/2023 0535 01/01/23 0435 01/01/23 1437 01/01/23 2047 01/02/23 0305  01/03/23 0446 01/04/23 0444  WBC 5.9  --  7.1 11.1*  --   --  10.4 10.6* 7.3  NEUTROABS 3.5  --  5.1  --   --   --   --   --   --   HGB 7.7*   < > 9.3* 8.8* 8.2* 8.3* 7.7* 7.5* 7.9*  HCT 23.5*  --  29.4* 27.3* 25.5* 25.6* 23.5* 23.3* 24.5*  MCV 89.4  --  90.2 90.4  --   --  89.0 89.6 90.1  PLT 45*   < > 40* 39*  --   --  37* 33* 28*   < > = values in this interval not displayed.   Basic Metabolic Panel: Recent Labs  Lab 12/20/2022 0535 01/01/23 0435 01/02/23 0305 01/03/23 0446 01/04/23 0444  NA 133* 133* 135 138 142  K 4.2 4.7 4.1 3.5 3.2*  CL 103 104 106 110 113*  CO2 20* 18* 19* 19* 20*  GLUCOSE 85 75 147* 130* 97  BUN 25* 27* 27* 29* 30*  CREATININE 1.30* 1.33* 1.11 1.08 1.10  CALCIUM 8.3* 8.1* 8.0* 8.0* 8.2*  MG  --  1.4* 2.0 2.2 2.0   GFR: Estimated Creatinine Clearance: 76.1 mL/min (by C-G formula based on SCr of 1.1 mg/dL). Liver Function Tests: Recent Labs  Lab 01/01/2023 0535 01/01/23 0435 01/02/23 0305 01/03/23 0446 01/04/23 0444  AST 42* 34 39 43* 32  ALT 11 10 13 17 14  $ ALKPHOS 333* 261* 249* 235* 216*  BILITOT 0.8 1.2 0.7 0.4 0.7  PROT 5.9* 5.2* 5.3* 5.5* 5.4*  ALBUMIN 2.8* 2.4* 2.2* 2.1* 2.1*   Coagulation Profile: Recent Labs  Lab 12/30/22 1347 12/27/2022 0535  INR 1.2 1.2   Sepsis Labs: Recent Labs  Lab 01/12/2023 0535 01/02/2023 1806  LATICACIDVEN 0.9 0.9    Recent Results (from the past 240 hour(s))  Urine Culture     Status: Abnormal   Collection Time: 12/30/22 12:00 PM   Specimen: Urine, Clean Catch  Result Value Ref Range Status   Specimen Description   Final    URINE, CLEAN CATCH Performed at [facility], [place] [address], [place], [place] [address]    Special Requests   Final    URINE, CLEAN CATCH Performed at [facility], [place] [address] [place], [place] [address]    Culture >=100,000 COLONIES/mL SERRATIA MARCESCENS (A)  Final   Report Status 01/01/2023 FINAL  Final   Organism  ID, Bacteria SERRATIA MARCESCENS (A)  Final      Susceptibility   Serratia marcescens - MIC*    CEFEPIME <=0.12 SENSITIVE Sensitive     CEFTRIAXONE <=0.25 SENSITIVE Sensitive     CIPROFLOXACIN <=0.25 SENSITIVE Sensitive     GENTAMICIN <=1 SENSITIVE Sensitive     NITROFURANTOIN 128 RESISTANT Resistant     TRIMETH/SULFA <=20 SENSITIVE Sensitive     * >=100,000 COLONIES/mL SERRATIA MARCESCENS  Blood Culture (routine x 2)     Status: None (Preliminary result)   Collection Time: 01/12/2023  5:35 AM   Specimen: BLOOD  Result Value Ref Range Status   Specimen Description BLOOD RIGHT ASSIST CONTROL  Final   Special Requests   Final    BOTTLES DRAWN AEROBIC AND ANAEROBIC Blood Culture results may not be optimal due to an excessive volume of blood received in culture bottles   Culture   Final    NO GROWTH 4 DAYS Performed at [facility], [address], [place], [place] [address]    Report Status PENDING  Incomplete  Urine Culture     Status: Abnormal   Collection Time: 12/22/2022  5:35 AM   Specimen: Urine, Random  Result Value Ref Range Status   Specimen Description   Final    URINE, RANDOM Performed at [facility], [address] [place], [place] [address]    Special Requests   Final    NONE Reflexed from [phone] Performed at [facility], [address] [place], [place] [address]    Culture (A)  Final    <10,000 COLONIES/mL INSIGNIFICANT GROWTH Performed at [facility], [place] [address], [place], [place] [address]    Report Status 01/01/2023 FINAL  Final  Resp panel by RT-PCR (RSV, Flu A&B, Covid) Anterior Nasal Swab     Status: None   Collection Time: 01/07/2023  5:38 AM   Specimen: Anterior Nasal Swab  Result Value Ref Range Status   SARS Coronavirus 2 by RT  PCR NEGATIVE NEGATIVE Final    Comment: (NOTE) SARS-CoV-2 target nucleic acids are NOT DETECTED.  The SARS-CoV-2 RNA is generally detectable in upper respiratory specimens during the acute phase of infection. The  lowest concentration of SARS-CoV-2 viral copies this assay can detect is 138 copies/mL. A negative result does not preclude SARS-Cov-2 infection and should not be used as the sole basis for treatment or other patient management decisions. A negative result may occur with  improper specimen collection/handling, submission of specimen other than nasopharyngeal swab, presence of viral mutation(s) within the areas targeted by this assay, and inadequate number of viral copies(<138 copies/mL). A negative result must be combined with clinical observations, patient history, and epidemiological information. The expected result is Negative.  Fact Sheet for Patients:  EntrepreneurPulse.com.au  Fact Sheet for Healthcare Providers:  IncredibleEmployment.be  This test is no t yet approved or cleared by the [place] FDA and  has been authorized for detection and/or diagnosis of SARS-CoV-2 by FDA under an Emergency Use Authorization (EUA). This EUA will remain  in effect (meaning this test can be used) for the duration of the COVID-19 declaration under Section 564(b)(1) of the Act, 21 U.S.C.section 360bbb-3(b)(1), unless the authorization is terminated  or revoked sooner.       Influenza A by PCR NEGATIVE NEGATIVE Final   Influenza B by PCR NEGATIVE NEGATIVE Final    Comment: (NOTE) The Xpert Xpress SARS-CoV-2/FLU/RSV plus assay is intended as an aid in the diagnosis of influenza from Nasopharyngeal swab specimens and should not be used as a sole basis for treatment. Nasal washings and aspirates are unacceptable for Xpert Xpress SARS-CoV-2/FLU/RSV testing.  Fact Sheet for Patients: EntrepreneurPulse.com.au  Fact Sheet for Healthcare Providers: IncredibleEmployment.be  This test is not yet approved or cleared by the [place] FDA and has been authorized for detection and/or diagnosis of SARS-CoV-2 by FDA under  an Emergency Use Authorization (EUA). This EUA will remain in effect (meaning this test can be used) for the duration of the COVID-19 declaration under Section 564(b)(1) of the Act, 21 U.S.C. section 360bbb-3(b)(1), unless the authorization is terminated or revoked.     Resp Syncytial Virus by PCR NEGATIVE NEGATIVE Final    Comment: (NOTE) Fact Sheet for Patients: EntrepreneurPulse.com.au  Fact Sheet for Healthcare Providers: IncredibleEmployment.be  This test is not yet approved or cleared by the [place] FDA and has been authorized for detection and/or diagnosis of SARS-CoV-2 by FDA under an Emergency Use Authorization (EUA). This EUA will remain in effect (meaning this test can be used) for the duration of the COVID-19 declaration under Section 564(b)(1) of the Act, 21 U.S.C. section 360bbb-3(b)(1), unless the authorization is terminated or revoked.  Performed at [facility], [address], [place], [place] [address]   Blood Culture (routine x 2)     Status: None (Preliminary result)   Collection Time: 12/24/2022  5:57 AM   Specimen: BLOOD  Result Value Ref Range Status   Specimen Description BLOOD port  Final   Special Requests   Final    BOTTLES DRAWN AEROBIC AND ANAEROBIC Blood Culture results may not be optimal due to an excessive volume of blood received in culture bottles   Culture   Final    NO GROWTH 4 DAYS Performed at [facility], [address], [place],  [address]    Report Status PENDING  Incomplete  MRSA Next Gen by PCR, Nasal     Status: None   Collection Time: 01/11/2023  8:50 AM  Specimen: Nasal Mucosa; Nasal Swab  Result Value Ref Range Status   MRSA by PCR Next Gen NOT DETECTED NOT DETECTED Final    Comment: (NOTE) The GeneXpert MRSA Assay (FDA approved for NASAL specimens only), is one component of a comprehensive MRSA colonization surveillance program. It is not intended to diagnose MRSA infection nor to  guide or monitor treatment for MRSA infections. Test performance is not FDA approved in patients less than 69 years old. Performed at [facility], [address], [place], [place] [address]     Radiology Studies: No results found.  Scheduled Meds:  ARIPiprazole  5 mg Oral Daily   Chlorhexidine Gluconate Cloth  6 each Topical Q0600   gabapentin  300 mg Oral QHS   metoprolol tartrate  25 mg Oral BID   pantoprazole (PROTONIX) IV  40 mg Intravenous Q12H   Continuous Infusions:  sodium chloride 50 mL/hr at 01/04/23 1243   cefTRIAXone (ROCEPHIN)  IV 2 g (01/04/23 0834)    LOS: 4 days   Roxan Hockey, MD [facility]  If 7PM-7AM, please contact night-coverage [email or web address] 01/04/2023, 6:58 PM

## 2023-01-04 NOTE — Progress Notes (Signed)
   01/04/23 0737  Vitals  Temp 100.2 F (37.9 C)  Temp Source Oral  BP (!) 141/94  MAP (mmHg) 107  Pulse Rate (!) 128  Resp (!) 22  Level of Consciousness  Level of Consciousness Alert  MEWS COLOR  MEWS Score Color Yellow  Oxygen Therapy  SpO2 93 %  O2 Device Room Air  MEWS Score  MEWS Temp 0  MEWS Systolic 0  MEWS Pulse 2  MEWS RR 1  MEWS LOC 0  MEWS Score 3   Patient states that he is having chest pain and points to the center of his chest. Patient is not able to tell me the frequency and if it is sharp, stabbing or dull. MD Emokpae notified.

## 2023-01-04 NOTE — Progress Notes (Signed)
   01/04/23 1936  Vitals  Temp (!) 102 F (38.9 C)  Temp Source Oral  BP 120/79  MAP (mmHg) 90  BP Location Left Arm  BP Method Automatic  Patient Position (if appropriate) Lying  Pulse Rate (!) 136  Pulse Rate Source Monitor  Resp (!) 32  Level of Consciousness  Level of Consciousness Responds to Pain  MEWS COLOR  MEWS Score Color Red  Oxygen Therapy  SpO2 93 %  O2 Device Nasal Cannula  O2 Flow Rate (L/min) 2 L/min  MEWS Score  MEWS Temp 2  MEWS Systolic 0  MEWS Pulse 3  MEWS RR 2  MEWS LOC 2  MEWS Score 9   Notified by NT that pt's VS have triggered a Red MEWS. In to room, pt with occasional moan but no verbal response, does not follow commands. Skin hot and dry, cap refill 3 - 4 seconds. Resp 40/min, gurgling sounds in upper airway, congested cough. Breath sounds coarse rhonchi right side, clear left side. HR RRR, 130-140/min apically, HR ST on monitor. Rapid response called.Temp elevated, tylenol supp admin'd. Response team in room, MD Zierhle-Ghosh in to evaluate pt. Order received to transfer pt to ICU. Pt transferred to ICU via bed by ICU staff member and [facility]. Family at bedside during this event, spoke with MD, questions answered.

## 2023-01-04 NOTE — Progress Notes (Signed)
Responded to Rapid Response on 300. At 2000. Hospitalist and AC at bedside. Patient minimally responsive to pain. Patient is lethargic, febrile per bedside RN and with increased respirations. Patient transferred to ICU room 1 with another RN and MD. Patient cleaned and checked into ICU. Family at bedside at this time.

## 2023-01-04 NOTE — Progress Notes (Addendum)
Family is requesting PO oxy crushed in applesauce. Discussed with family that patient is not awake enough to take the medicine which will put him at risk for aspiration pneumonia. MD Emokpae notified. MD Denton Brick states it is family's choice if they would like the IV dilaudid again. Family states to hold off for now. MD Emokpae notified.

## 2023-01-04 NOTE — Progress Notes (Signed)
Patient's wife is currently in the room and states that she would like to talk to the doctor. The wife also states that she does not want patient having anymore dilaudid and patient needs to see physical therapy. MD Emokpae notified.

## 2023-01-05 DIAGNOSIS — C679 Malignant neoplasm of bladder, unspecified: Secondary | ICD-10-CM

## 2023-01-05 LAB — CULTURE, BLOOD (ROUTINE X 2)
Culture: NO GROWTH
Culture: NO GROWTH

## 2023-01-05 MED ORDER — GLYCOPYRROLATE 0.2 MG/ML IJ SOLN
0.1000 mg | Freq: Once | INTRAMUSCULAR | Status: AC
  Administered 2023-01-05: 0.1 mg via INTRAVENOUS
  Filled 2023-01-05: qty 1

## 2023-01-05 MED ORDER — FENTANYL 2500MCG IN NS 250ML (10MCG/ML) PREMIX INFUSION: 0.0000 ug/h | INTRAVENOUS | Status: DC

## 2023-01-05 MED ORDER — MORPHINE 100MG IN NS 100ML (1MG/ML) PREMIX INFUSION
5.0000 mg/h | INTRAVENOUS | Status: DC
  Administered 2023-01-05: 5 mg/h via INTRAVENOUS
  Filled 2023-01-05: qty 100

## 2023-01-06 ENCOUNTER — Encounter (HOSPITAL_COMMUNITY): Payer: Self-pay | Admitting: Hematology

## 2023-01-08 ENCOUNTER — Encounter: Payer: Self-pay | Admitting: Hematology

## 2023-01-09 LAB — CULTURE, BLOOD (ROUTINE X 2)
Culture: NO GROWTH
Culture: NO GROWTH
Special Requests: ADEQUATE
Special Requests: ADEQUATE

## 2023-01-10 ENCOUNTER — Telehealth: Payer: Self-pay

## 2023-01-10 ENCOUNTER — Inpatient Hospital Stay: Payer: BC Managed Care – PPO

## 2023-01-10 ENCOUNTER — Inpatient Hospital Stay: Payer: BC Managed Care – PPO | Admitting: Hematology

## 2023-01-10 ENCOUNTER — Other Ambulatory Visit: Payer: BC Managed Care – PPO

## 2023-01-10 ENCOUNTER — Ambulatory Visit: Payer: BC Managed Care – PPO

## 2023-01-10 NOTE — Telephone Encounter (Signed)
Pt's wife Barnett Applebaum called stating that pt died 08-Feb-2023 and wasn't sure if Dr. Ernestina Penna office was aware of the pt's death.  Barnett Applebaum also wants to speak with Dr. Burr Medico regarding the [facility] results and to know whether or not they have resulted.  Barnett Applebaum stated she just want closure.  Barnett Applebaum is requesting a call from Dr. Burr Medico.  Notified Dr. Burr Medico of the pt's wife's call.

## 2023-01-15 ENCOUNTER — Inpatient Hospital Stay: Payer: BC Managed Care – PPO

## 2023-01-16 ENCOUNTER — Telehealth: Payer: Self-pay

## 2023-01-16 ENCOUNTER — Other Ambulatory Visit: Payer: Self-pay

## 2023-01-16 NOTE — Telephone Encounter (Signed)
Mailed out the [facility] report as per Dr. Burr Medico to patients wife.

## 2023-01-17 NOTE — Progress Notes (Signed)
-   See full death summary for same date of service  Roxan Hockey, MD

## 2023-01-17 NOTE — Progress Notes (Signed)
   01-28-23 0845  Attending Physican Contact  Attending Physician Notified Y  Attending Physician (First and Last Name) Dr. Illene Bolus [place] Checklist  Date of Death 2023/01/28  Time of Death 0834  Pronounced By Dolan Amen.  Next of kin notified Yes  Name of next of kin notified of death Story Person's Relationship to Patient Spouse  Contact Person's Phone Number [phone]  Contact Person's address [place], [place] [address]  Family Communication Notes At bedside  Was the patient a No Code Blue or a Limited Code Blue? Yes  Did the patient die unattended? No  Patient restrained? Not applicable  Height 5' 11"$  (1.803 m)  Weight 83.5 kg  [employer] (previously known as Brewing technologist)  Notification Date January 28, 2023  Notification Time 0845  [employer] Number [id number]  Is patient a potential donor? Y  Donation Type Eyes  Autopsy  Autopsy requested by MD or Family ( Non ME Case) N/A  Patient and [place] Returned  Patient is satisfied that all belongings have been returned? Yes  Name of person receiving valuables? Spouse: [place]  Specify valuables returned Dentures, clothing  Dermatherapy linen/gowns NOT sent with patient or transporter Disposable Patient Transfer/ Apparel Kit used (Spouse threw personal blanket in the trash)  Dead on Arrival (Emergency Department)  Patient dead on arrival? No  Medical Examiner  Is this a medical examiner's case? [place] home name/address/phone # [place] [place] [phone]  Planned location of pickup [facility]

## 2023-01-17 NOTE — Progress Notes (Signed)
Patient transitioned to comfort care at 2300. Hospitalist at bedside with this RN, patients spouse, and children. All questions answered and family given an overview of this process. Family agreed to continue with comfort measures stating "he has fought long enough"   Patient positioned in a position of comfort, extra chairs provided for family to be at bedside and comfort cart with refreshments provided for family.

## 2023-01-17 NOTE — Death Summary Note (Signed)
DEATH SUMMARY   Patient Details  Name: Barry Gibson MRN: [id number] DOB: 01/05/1961 Barry Gibson, Barry Del, DO Admission/Discharge Information   Admit Date:  January 13, 2023  Date of Death: Date of Death: 01/18/2023  Time of Death: Time of Death: 0834  Length of Stay: 5   Principle Cause of death: Metastatic bladder cancer   Hospital Diagnoses: Principal Problem:   Sepsis ([facility]) Active Problems:   Hyperlipidemia   Essential hypertension, benign   Rheumatoid arthritis ([place])   Bladder cancer ([place])   Port-A-Cath in place   Anemia   CKD (chronic kidney disease) stage 3, GFR 30-59 ml/min ([facility])   Elevated alkaline phosphatase level   Blood in stool   Hospital Course: 1)Metastatic bladder cancer With prior cystectomy and ileal conduit placement and mets to spine Currently on chemoradiation and follows with oncology Dr Burr Medico On chronic pain regimen -As per Dr Burr Medico No further chemo due to progression of disease  2)Sepsis, POA, secondary to Serratia UTI-  Treated with Rocephin -Blood cultures were negative   3)Acute metabolic encephalopathy secondary to above-  Head CT WNL -Despite discontinuing IV Dilaudid patient continues to lethargic   4)History of hypertension -Treated   5)Anemia/thrombocytopenia with heme pos stool Appreciate GI following with potential plan for endoscopy once clinically stable prior to discharge -Treated with PPI --Patient had 1 unit of PRBC on 12/30/2022  6)Severe back pain secondary to metastatic bladder cancer -Treated with narcotics, lidocaine patch, and Voltaren gel  7)History of rheumatoid arthritis On hydroxychloroquine, was held -Oxycodone as needed   Social/ethics--- DNR/DNI, over prognosis is poor given progression of his metastatic bladder cancer not amenable to chemotherapy, acute infection, anemia and worsening thrombocytopenia  Consultations: GI and oncology  The results of significant diagnostics from this hospitalization (including  imaging, microbiology, ancillary and laboratory) are listed below for reference.   Significant Diagnostic Studies: DG Chest Port 1 View  Result Date: 01/04/2023 CLINICAL DATA:  Aspiration. EXAM: PORTABLE CHEST 1 VIEW COMPARISON:  Chest radiograph dated 2023/01/13. FINDINGS: Right-sided Port-A-Cath with tip at the cavoatrial junction. There is diffuse interstitial prominence and hazy airspace densities most consistent with edema. Superimposed pneumonia is not excluded. There is a small left pleural effusion and left lung base atelectasis, new or increased since the prior radiograph. No pneumothorax. Stable cardiac silhouette. No acute osseous pathology. IMPRESSION: 1. Findings most consistent with edema. Superimposed pneumonia is not excluded. 2. Small left pleural effusion and left lung base atelectasis. Electronically Signed   By: Anner Crete M.D.   On: 01/04/2023 20:29   CT SOFT TISSUE NECK W CONTRAST  Result Date: 01/02/2023 CLINICAL DATA:  Soft tissue infection suspected. Possible left-sided neck mass. EXAM: CT NECK WITH CONTRAST TECHNIQUE: Multidetector CT imaging of the neck was performed using the standard protocol following the bolus administration of intravenous contrast. RADIATION DOSE REDUCTION: This exam was performed according to the departmental dose-optimization program which includes automated exposure control, adjustment of the mA and/or kV according to patient size and/or use of iterative reconstruction technique. CONTRAST:  56m OMNIPAQUE IOHEXOL 300 MG/ML  SOLN COMPARISON:  CT chest/abdomen/pelvis 11/21/2022. FINDINGS: Pharynx and larynx: No mass or edema. Salivary glands: No inflammation, mass, or stone. Thyroid: Normal. Lymph nodes: No suspicious cervical lymph nodes. Vascular: Atherosclerotic calcifications of the carotid bulbs. Limited intracranial: Unremarkable. Visualized orbits: Unremarkable. Mastoids and visualized paranasal sinuses: Right mastoid middle ear effusion. No  nasopharyngeal mass. Severe mucosal disease in the bilateral frontal sinuses and anterior ethmoid air cells. Skeleton: Redemonstrated diffuse sclerotic metastases.  Upper chest: Moderate bilateral pleural effusions. Partially visualized right chest Port-A-Cath. Other: No mass or fluid collection in the left neck soft tissues. IMPRESSION: 1. No mass or fluid collection in the left neck soft tissues. 2. Redemonstrated diffuse sclerotic metastases. 3. Moderate bilateral pleural effusions. 4. Severe mucosal disease in the bilateral frontal sinuses and anterior ethmoid air cells. Correlate with signs/symptoms of sinusitis. Electronically Signed   By: Emmit Alexanders M.D.   On: 01/02/2023 16:58   CT HEAD WO CONTRAST (5MM)  Result Date: 01/01/2023 CLINICAL DATA:  Mental status change, unknown cause EXAM: CT HEAD WITHOUT CONTRAST TECHNIQUE: Contiguous axial images were obtained from the base of the skull through the vertex without intravenous contrast. RADIATION DOSE REDUCTION: This exam was performed according to the departmental dose-optimization program which includes automated exposure control, adjustment of the mA and/or kV according to patient size and/or use of iterative reconstruction technique. COMPARISON:  None Available. FINDINGS: Brain: No evidence of acute large vascular territory infarct, acute hemorrhage, intraparenchymal mass lesion, midline shift or hydrocephalus. 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 Vascular: No hyperdense vessel identified. Skull: No acute fracture. Sinuses/Orbits: Paranasal sinus mucosal thickening. No acute orbital findings. Other: Small right mastoid effusion with middle ear fluid. IMPRESSION: 1. No evidence of acute intracranial abnormality. 2. 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  Electronically Signed   By: Margaretha Sheffield M.D.   On: 01/01/2023 11:29   DG Chest Port 1 View  Result Date: 12/27/2022 CLINICAL DATA:  Questionable sepsis EXAM: PORTABLE CHEST 1 VIEW COMPARISON:  12/13/2022 FINDINGS: Low volume chest. Hazy appearance of the bases. No suspected edema. No pneumothorax. Porta catheter on the right with tip at the right atrium. IMPRESSION: Hazy appearance at the bases, cannot exclude pneumonia in this clinical setting. Electronically Signed   By: Jorje Guild M.D.   On: 01/14/2023 06:09   DG Chest Port 1 View  Result Date: 12/13/2022 CLINICAL DATA:  Hypotension EXAM: PORTABLE CHEST 1 VIEW COMPARISON:  09/16/2022 FINDINGS: Right-sided chest port remains in place. Normal heart size. No focal airspace consolidation, pleural effusion, or pneumothorax. Known osseous metastatic disease, better seen on cross-sectional imaging. IMPRESSION: No active disease. Electronically Signed   By: Davina Poke D.O.   On: 12/13/2022 16:24   MR THORACIC SPINE W WO CONTRAST  Result Date: 12/11/2022 CLINICAL DATA:  Malignant neoplasm of urinary bladder, unspecified site ([place]) C67.9 (ICD-10-CM). Metastatic disease evaluation. EXAM: MRI THORACIC AND LUMBAR SPINE WITHOUT AND WITH CONTRAST TECHNIQUE: Multiplanar and multiecho pulse sequences of the thoracic and lumbar spine were obtained without and with intravenous contrast. CONTRAST:  52m GADAVIST GADOBUTROL 1 MMOL/ML IV SOLN COMPARISON:  CT chest abdomen and pelvis November 21, 2022. FINDINGS: MRI THORACIC SPINE FINDINGS Alignment:  Physiologic. Vertebrae: Diffuse T1 hypointensity throughout the thoracic vertebrae and visualized portion of the ribs with heterogeneous signal on T2 and STIR and heterogeneous diffuse contrast enhancement, consistent with diffuse metastatic disease to the bones. T1 localizer images of the cervical spine also show diffuse T1 signal abnormality. There is no associated fracture or retropulsion. Cord:  Normal signal  and morphology. Paraspinal and other soft tissues: Cholelithiasis. Minimal bilateral pleural effusions. Disc levels: Tiny posterior disc protrusions are seen at T5-6, T6-7 and T7-8. No significant spinal canal or neural foraminal stenosis at any level. MRI LUMBAR SPINE FINDINGS Segmentation:  Standard. Alignment:  Physiologic. Vertebrae: Diffuse T1 hypointensity throughout the lumbar spine, sacral and visualized pelvic bones with heterogeneous signal on T2 and STIR and heterogeneous diffuse contrast enhancement, consistent with diffuse metastatic disease to the bones. There is no associated fracture or retropulsion. Conus medullaris: Extends to the L1 level and appears normal. Paraspinal and other soft tissues: Stable 4 cm infrarenal aortic aneurysm. Disc levels: T12-L1, L1-2 and L2-3: No spinal canal or neural foraminal stenosis. L3-4: Disc bulge and mild facet degenerative changes with ligamentum flavum redundancy resulting in mild spinal canal stenosis with moderate narrowing of the bilateral subarticular zones and moderate bilateral neural foraminal narrowing. L4-5: Disc bulge, mild-to-moderate facet degenerative changes and mild ligamentum flavum redundancy resulting in mild spinal canal stenosis with mild narrowing of the bilateral subarticular zones, moderate right and mild left neural foraminal narrowing. L5-S1: Left central disc protrusion and mild facet degenerative changes resulting in moderate narrowing of the left subarticular zone, mild right and moderate left neural foraminal narrowing. IMPRESSION: 1. Diffuse osseous metastatic disease to the spine, ribs, sacrum and visualized pelvic bones. No associated fracture or retropulsion. 2. Degenerative changes of the lumbar spine with mild spinal canal stenosis at L3-4 and L4-5 and moderate narrowing of the bilateral subarticular zones and moderate bilateral neural foraminal narrowing at L3-4. 3. Moderate narrowing of the left subarticular zone and moderate  left neural foraminal narrowing at L5-S1. 4. Cholelithiasis. 5. Stable 4 cm infrarenal aortic aneurysm. Electronically Signed   By: Pedro Earls M.D.   On: 12/11/2022 09:20   MR Lumbar Spine W Wo Contrast  Result Date: 12/11/2022 CLINICAL DATA:  Malignant neoplasm of urinary bladder, unspecified site ([place]) C67.9 (ICD-10-CM). Metastatic disease evaluation. EXAM: MRI THORACIC AND LUMBAR SPINE WITHOUT AND WITH CONTRAST TECHNIQUE: Multiplanar and multiecho pulse sequences of the thoracic and lumbar spine were obtained without and with intravenous contrast. CONTRAST:  71m GADAVIST GADOBUTROL 1 MMOL/ML IV SOLN COMPARISON:  CT chest abdomen and pelvis November 21, 2022. FINDINGS: MRI THORACIC SPINE FINDINGS Alignment:  Physiologic. Vertebrae: Diffuse T1 hypointensity throughout the thoracic vertebrae and visualized portion of the ribs with heterogeneous signal on T2 and STIR and heterogeneous diffuse contrast enhancement, consistent with diffuse metastatic disease to the bones. T1 localizer images of the cervical spine also show diffuse T1 signal abnormality. There is no associated fracture or retropulsion. Cord:  Normal signal and morphology. Paraspinal and other soft tissues: Cholelithiasis. Minimal bilateral pleural effusions. Disc levels: Tiny posterior disc protrusions are seen at T5-6, T6-7 and T7-8. No significant spinal canal or neural foraminal stenosis at any level. MRI LUMBAR SPINE FINDINGS Segmentation:  Standard. Alignment:  Physiologic. Vertebrae: Diffuse T1 hypointensity throughout the lumbar spine, sacral and visualized pelvic bones with heterogeneous signal on T2 and STIR and heterogeneous diffuse contrast enhancement, consistent with diffuse metastatic disease to the bones. There is no associated fracture or retropulsion. Conus medullaris: Extends to the L1 level and appears normal. Paraspinal and other soft tissues: Stable 4 cm infrarenal aortic aneurysm. Disc levels: T12-L1, L1-2 and  L2-3: No spinal canal or neural foraminal stenosis. L3-4: Disc bulge and mild facet degenerative changes with ligamentum flavum redundancy resulting in mild spinal canal stenosis with moderate narrowing of the bilateral subarticular zones and moderate bilateral neural foraminal narrowing. L4-5: Disc bulge, mild-to-moderate facet degenerative changes and mild ligamentum flavum redundancy resulting in mild spinal canal stenosis with mild narrowing of the bilateral subarticular zones, moderate right and mild left neural foraminal narrowing. L5-S1: Left central disc protrusion and  mild facet degenerative changes resulting in moderate narrowing of the left subarticular zone, mild right and moderate left neural foraminal narrowing. IMPRESSION: 1. Diffuse osseous metastatic disease to the spine, ribs, sacrum and visualized pelvic bones. No associated fracture or retropulsion. 2. Degenerative changes of the lumbar spine with mild spinal canal stenosis at L3-4 and L4-5 and moderate narrowing of the bilateral subarticular zones and moderate bilateral neural foraminal narrowing at L3-4. 3. Moderate narrowing of the left subarticular zone and moderate left neural foraminal narrowing at L5-S1. 4. Cholelithiasis. 5. Stable 4 cm infrarenal aortic aneurysm. Electronically Signed   By: Pedro Earls M.D.   On: 12/11/2022 09:20    Microbiology: Recent Results (from the past 240 hour(s))  Urine Culture     Status: Abnormal   Collection Time: 12/30/22 12:00 PM   Specimen: Urine, Clean Catch  Result Value Ref Range Status   Specimen Description   Final    URINE, CLEAN CATCH Performed at [facility], [place] [address], [place], [place] [address]    Special Requests   Final    URINE, CLEAN CATCH Performed at [facility], [place] [address] [place], [place] [address]    Culture >=100,000 COLONIES/mL SERRATIA MARCESCENS (A)  Final   Report Status 01/01/2023 FINAL   Final   Organism ID, Bacteria SERRATIA MARCESCENS (A)  Final      Susceptibility   Serratia marcescens - MIC*    CEFEPIME <=0.12 SENSITIVE Sensitive     CEFTRIAXONE <=0.25 SENSITIVE Sensitive     CIPROFLOXACIN <=0.25 SENSITIVE Sensitive     GENTAMICIN <=1 SENSITIVE Sensitive     NITROFURANTOIN 128 RESISTANT Resistant     TRIMETH/SULFA <=20 SENSITIVE Sensitive     * >=100,000 COLONIES/mL SERRATIA MARCESCENS  Blood Culture (routine x 2)     Status: None   Collection Time: 01/02/2023  5:35 AM   Specimen: BLOOD  Result Value Ref Range Status   Specimen Description BLOOD RIGHT ASSIST CONTROL  Final   Special Requests   Final    BOTTLES DRAWN AEROBIC AND ANAEROBIC Blood Culture results may not be optimal due to an excessive volume of blood received in culture bottles   Culture   Final    NO GROWTH 5 DAYS Performed at [facility], [address], [place], [place] [address]    Report Status January 15, 2023 FINAL  Final  Urine Culture     Status: Abnormal   Collection Time: 01/01/2023  5:35 AM   Specimen: Urine, Random  Result Value Ref Range Status   Specimen Description   Final    URINE, RANDOM Performed at [facility], [address], [place], [place] [address]    Special Requests   Final    NONE Reflexed from [id number] Performed at [facility], [address], [place], [place] [address]    Culture (A)  Final    <10,000 COLONIES/mL INSIGNIFICANT GROWTH Performed at [facility], [place] [address] [place], [place] [address]    Report Status 01/01/2023 FINAL  Final  Resp panel by RT-PCR (RSV, Flu A&B, Covid) Anterior Nasal Swab     Status: None   Collection Time: 12/20/2022  5:38 AM   Specimen: Anterior Nasal Swab  Result Value Ref Range Status   SARS Coronavirus 2 by RT PCR NEGATIVE NEGATIVE Final    Comment: (NOTE) SARS-CoV-2 target nucleic acids are NOT DETECTED.  The SARS-CoV-2 RNA is generally detectable in upper respiratory specimens during the acute  phase of infection. The  lowest concentration of SARS-CoV-2 viral copies this assay can detect is 138 copies/mL. A negative result does not preclude SARS-Cov-2 infection and should not be used as the sole basis for treatment or other patient management decisions. A negative result may occur with  improper specimen collection/handling, submission of specimen other than nasopharyngeal swab, presence of viral mutation(s) within the areas targeted by this assay, and inadequate number of viral copies(<138 copies/mL). A negative result must be combined with clinical observations, patient history, and epidemiological information. The expected result is Negative.  Fact Sheet for Patients:  EntrepreneurPulse.com.au  Fact Sheet for Healthcare Providers:  IncredibleEmployment.be  This test is no t yet approved or cleared by the [place] FDA and  has been authorized for detection and/or diagnosis of SARS-CoV-2 by FDA under an Emergency Use Authorization (EUA). This EUA will remain  in effect (meaning this test can be used) for the duration of the COVID-19 declaration under Section 564(b)(1) of the Act, 21 U.S.C.section 360bbb-3(b)(1), unless the authorization is terminated  or revoked sooner.       Influenza A by PCR NEGATIVE NEGATIVE Final   Influenza B by PCR NEGATIVE NEGATIVE Final    Comment: (NOTE) The Xpert Xpress SARS-CoV-2/FLU/RSV plus assay is intended as an aid in the diagnosis of influenza from Nasopharyngeal swab specimens and should not be used as a sole basis for treatment. Nasal washings and aspirates are unacceptable for Xpert Xpress SARS-CoV-2/FLU/RSV testing.  Fact Sheet for Patients: EntrepreneurPulse.com.au  Fact Sheet for Healthcare Providers: IncredibleEmployment.be  This test is not yet approved or cleared by the [place] FDA and has been authorized for detection and/or diagnosis of SARS-CoV-2 by FDA under  an Emergency Use Authorization (EUA). This EUA will remain in effect (meaning this test can be used) for the duration of the COVID-19 declaration under Section 564(b)(1) of the Act, 21 U.S.C. section 360bbb-3(b)(1), unless the authorization is terminated or revoked.     Resp Syncytial Virus by PCR NEGATIVE NEGATIVE Final    Comment: (NOTE) Fact Sheet for Patients: EntrepreneurPulse.com.au  Fact Sheet for Healthcare Providers: IncredibleEmployment.be  This test is not yet approved or cleared by the [place] FDA and has been authorized for detection and/or diagnosis of SARS-CoV-2 by FDA under an Emergency Use Authorization (EUA). This EUA will remain in effect (meaning this test can be used) for the duration of the COVID-19 declaration under Section 564(b)(1) of the Act, 21 U.S.C. section 360bbb-3(b)(1), unless the authorization is terminated or revoked.  Performed at [facility], [address], [place], [place] [address]   Blood Culture (routine x 2)     Status: None   Collection Time: 01/15/2023  5:57 AM   Specimen: BLOOD  Result Value Ref Range Status   Specimen Description BLOOD port  Final   Special Requests   Final    BOTTLES DRAWN AEROBIC AND ANAEROBIC Blood Culture results may not be optimal due to an excessive volume of blood received in culture bottles   Culture   Final    NO GROWTH 5 DAYS Performed at [facility], [address], [place], [place] [address]    Report Status 01/17/2023 FINAL  Final  MRSA Next Gen by PCR, Nasal     Status: None   Collection Time: 01/02/2023  8:50 AM   Specimen: Nasal Mucosa; Nasal Swab  Result Value Ref Range Status   MRSA by PCR Next Gen NOT DETECTED NOT DETECTED Final    Comment: (NOTE) The  GeneXpert MRSA Assay (FDA approved for NASAL specimens only), is one component of a comprehensive MRSA colonization surveillance program. It is not intended to diagnose MRSA infection nor to guide or  monitor treatment for MRSA infections. Test performance is not FDA approved in patients less than 59 years old. Performed at [facility], [address], [place], [place] [address]   Culture, blood (Routine X 2) w Reflex to ID Panel     Status: None (Preliminary result)   Collection Time: 01/04/23  8:04 PM   Specimen: Porta Cath; Blood  Result Value Ref Range Status   Specimen Description PORTA CATH BOTTLES DRAWN AEROBIC AND ANAEROBIC  Final   Special Requests Blood Culture adequate volume  Final   Culture   Final    NO GROWTH < 12 HOURS Performed at [facility], [address] [place], [place] [address]    Report Status PENDING  Incomplete  Culture, blood (Routine X 2) w Reflex to ID Panel     Status: None (Preliminary result)   Collection Time: 01/04/23  8:14 PM   Specimen: Left Antecubital; Blood  Result Value Ref Range Status   Specimen Description   Final    LEFT ANTECUBITAL BOTTLES DRAWN AEROBIC AND ANAEROBIC   Special Requests Blood Culture adequate volume  Final   Culture   Final    NO GROWTH < 12 HOURS Performed at [facility], [address], [place], [place] [address]    Report Status PENDING  Incomplete  MRSA Next Gen by PCR, Nasal     Status: None   Collection Time: 01/04/23  8:14 PM   Specimen: Nasal Mucosa; Nasal Swab  Result Value Ref Range Status   MRSA by PCR Next Gen NOT DETECTED NOT DETECTED Final    Comment: (NOTE) The GeneXpert MRSA Assay (FDA approved for NASAL specimens only), is one component of a comprehensive MRSA colonization surveillance program. It is not intended to diagnose MRSA infection nor to guide or monitor treatment for MRSA infections. Test performance is not FDA approved in patients less than 43 years old. Performed at [facility], [address], [place], [place] [address]    Time spent: 39 minutes  Signed: Roxan Hockey, MD 01/28/23

## 2023-01-17 DEATH — deceased

## 2023-01-24 ENCOUNTER — Other Ambulatory Visit: Payer: BC Managed Care – PPO

## 2023-01-24 ENCOUNTER — Ambulatory Visit: Payer: BC Managed Care – PPO

## 2023-01-24 ENCOUNTER — Encounter (HOSPITAL_COMMUNITY): Payer: Self-pay | Admitting: Hematology

## 2023-03-03 ENCOUNTER — Ambulatory Visit: Payer: BC Managed Care – PPO | Admitting: Family Medicine

## 2023-03-21 NOTE — Progress Notes (Signed)
  Radiation Oncology         [phone]) [phone] ________________________________  Name: Barry Gibson MRN: [id number]  Date: 12/20/2022  DOB: Feb 14, 1961  End of Treatment Note  Diagnosis:   61 y/o man with painful osseous metastases in the spine secondary to metastatic bladder cancer      Indication for treatment:  Palliation       Radiation treatment dates:   12/05/22 - 12/20/22  Site/dose:   The painful metastatic disease in the lumbar spine was treated to 30 Gy in 10 fractions.   Beams/energy:   A 3D field set-up was employed with 6 MV X-rays  Narrative: The patient tolerated radiation treatment relatively well.     Plan: The patient has completed radiation treatment. The patient will return to radiation oncology clinic for routine followup in one month. I advised him to call or return sooner if he has any questions or concerns related to his recovery or treatment. ________________________________  Artist Pais. Kathrynn Running, M.D.

## 2023-04-19 NOTE — Patient Care Conference (Signed)
   Admit Date:  01-27-23  Date of Death: Date of Death: Feb 01, 2023  Time of Death: Time of Death: 0834   To whom it may concern:-  Barry Gibson died at [facility] in [place], [place] on 02/01/2023 at 3664 am  [name], MD  -

## 2023-04-25 NOTE — Telephone Encounter (Signed)
Error

## 2023-06-18 NOTE — Progress Notes (Signed)
This encounter was created in error - please disregard.

## 2023-08-03 IMAGING — DX DG CHEST 1V PORT
1 series · 1 of 1 positions shown · non-contrast
Comparison: 10/22/2021

CLINICAL DATA: Fatigue, fever, sepsis, prostate cancer

EXAM:
PORTABLE CHEST 1 VIEW

[chest ap]
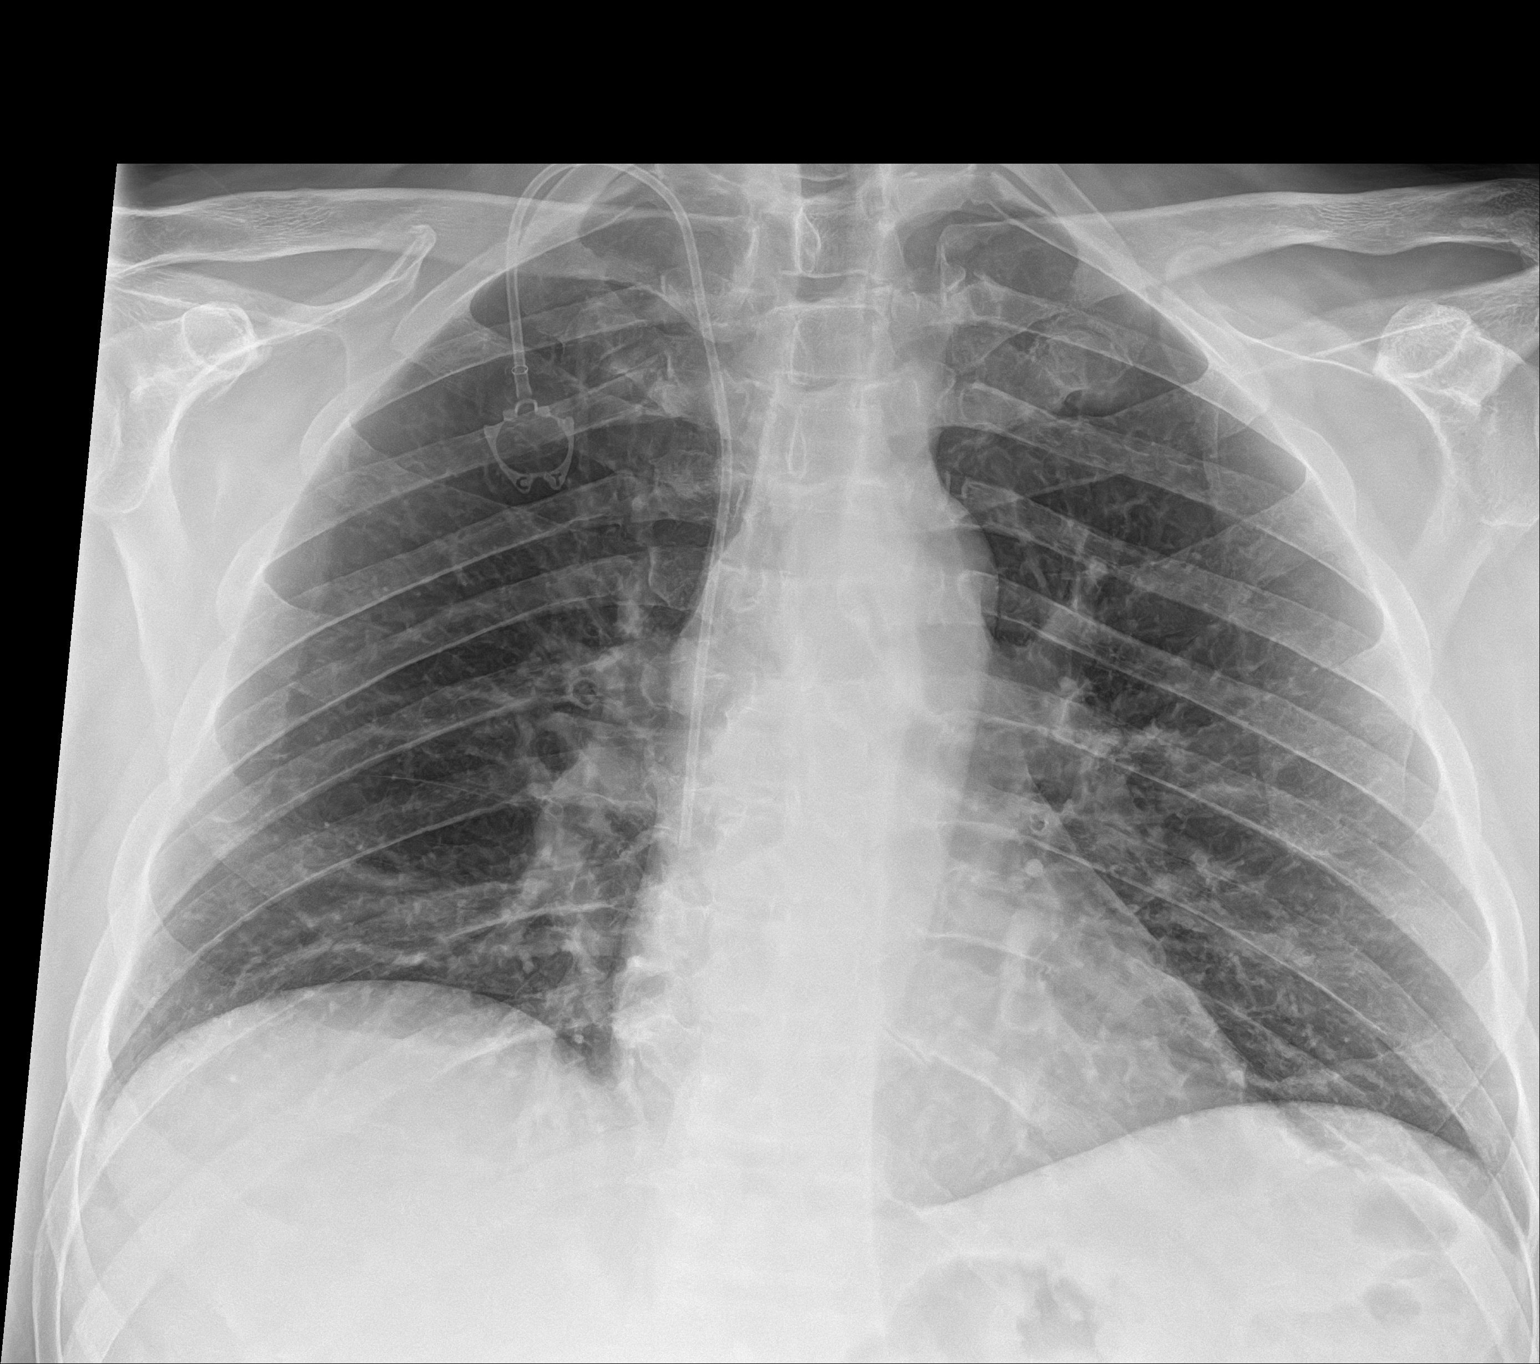

[1 of 1 positions shown; findings below may reference images not displayed]

FINDINGS: Single frontal view of the chest demonstrates a stable right chest
wall port. Cardiac silhouette is unremarkable. No acute airspace
disease, effusion, or pneumothorax. No acute bony abnormality.
IMPRESSION: 1. No acute intrathoracic process.

## 2023-08-30 IMAGING — CT CT CHEST-ABD-PELV W/ CM
2 of 8 series · 8 of 46 positions shown, 14 images · IV contrast (agent unspecified)
Comparison: CT chest abdomen pelvis 08/31/2021 and CT abdomen
pelvis 09/13/2021.

CLINICAL DATA: Invasive bladder cancer, assess treatment response.
Chemotherapy complete, immunotherapy ongoing.

EXAM:
CT CHEST, ABDOMEN, AND PELVIS WITH CONTRAST
TECHNIQUE: Multidetector CT imaging of the chest, abdomen and pelvis was
performed following the standard protocol during bolus
administration of intravenous contrast.

[Series 2: axial post · axial · 0.77mm/px · z∈[-412,+48]mm · 5 of 138 slices shown, 10 images]
[im 23/138  soft-tissue]
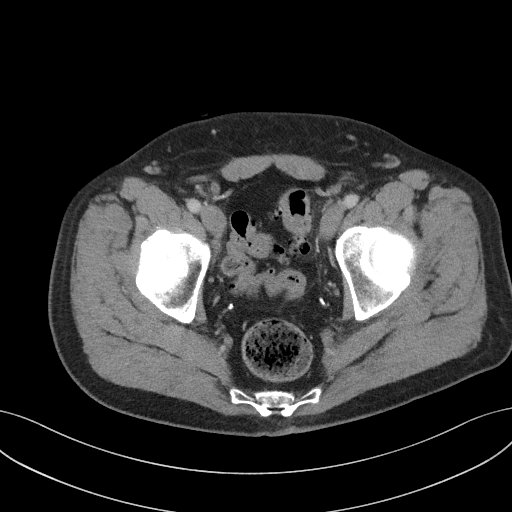
[im 23/138  bone]
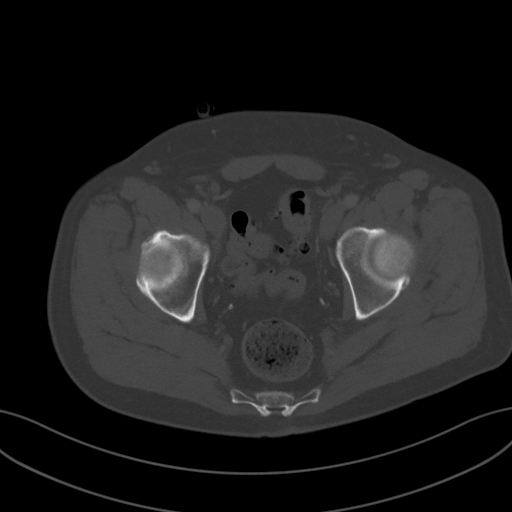
[im 46/138  soft-tissue]
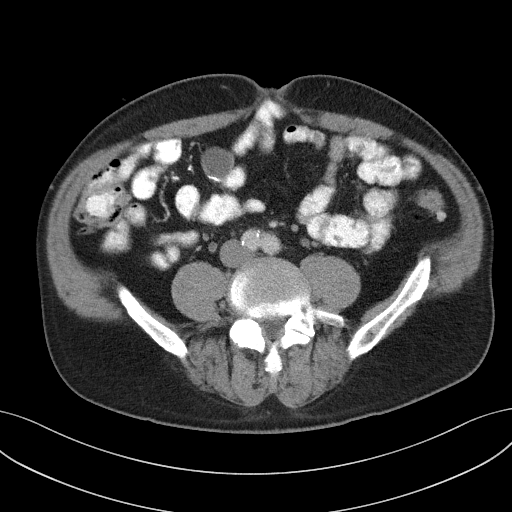
[im 46/138  lung]
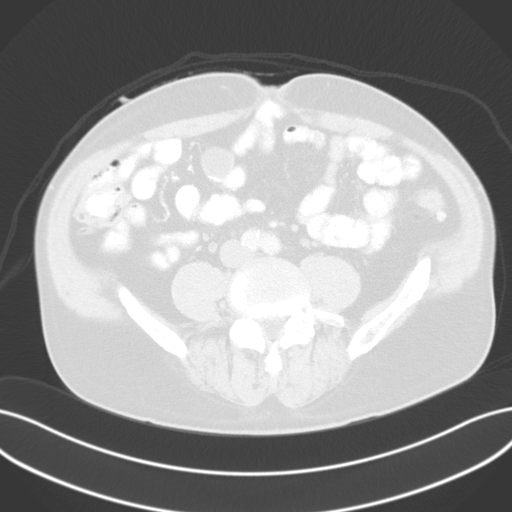
[im 69/138  soft-tissue]
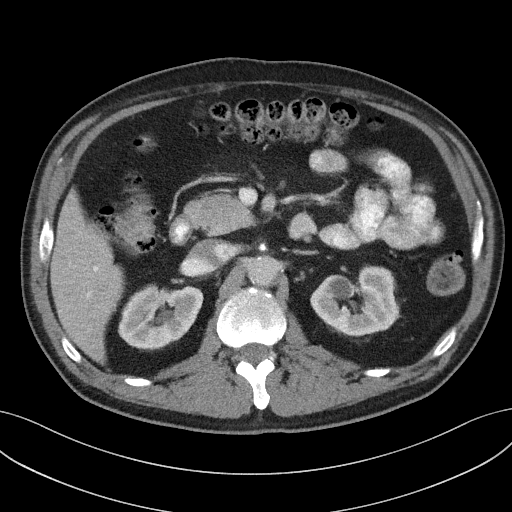
[im 69/138  lung]
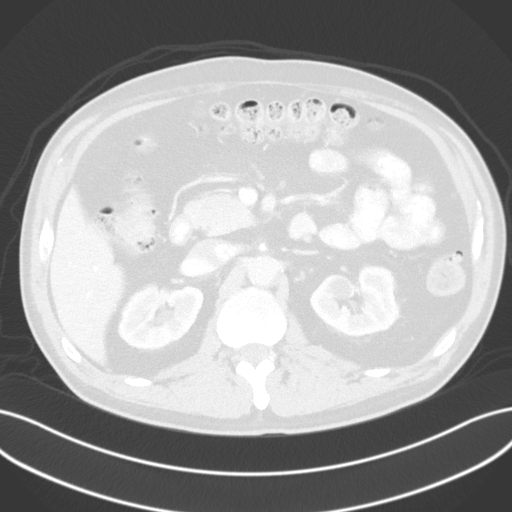
[im 92/138  soft-tissue]
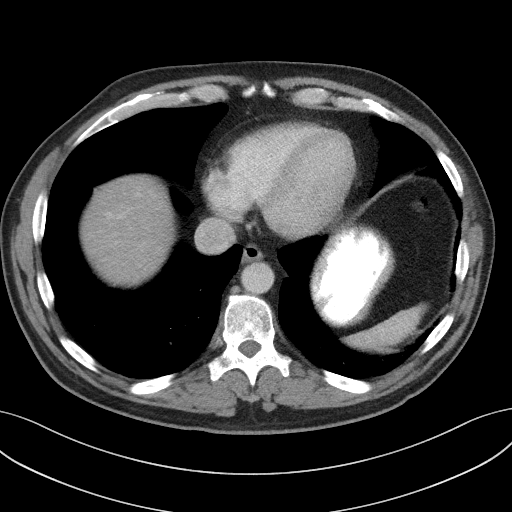
[im 92/138  lung]
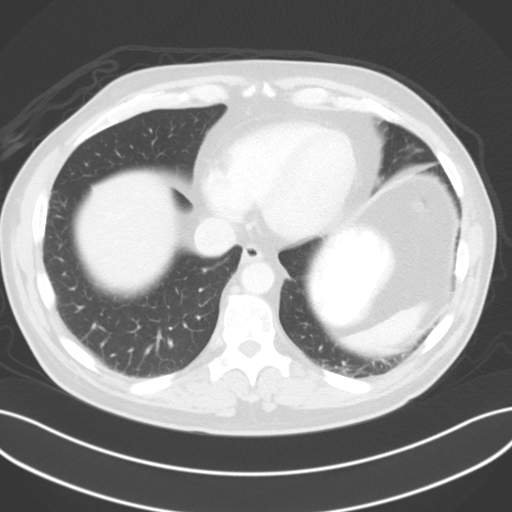
[im 115/138  soft-tissue]
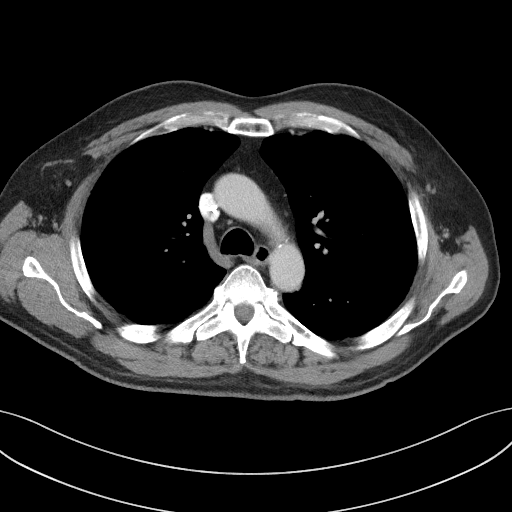
[im 115/138  lung]
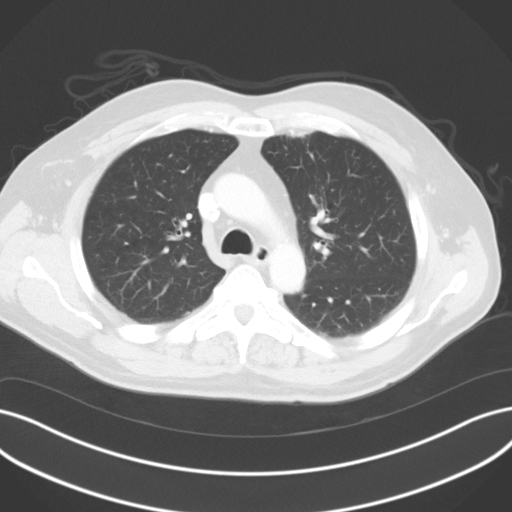

[Series 5: coronal post · coronal · 0.84mm/px · 3 of 98 slices shown, 4 images]
[im 25/98  soft-tissue]
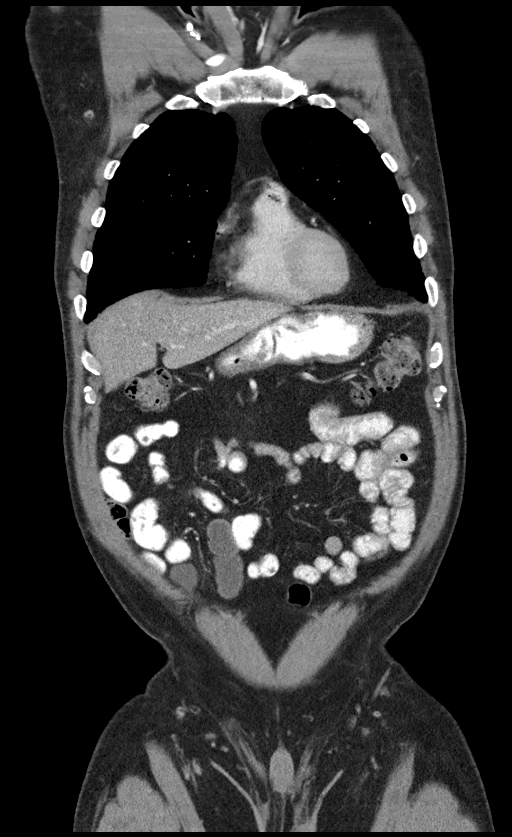
[im 49/98  soft-tissue]
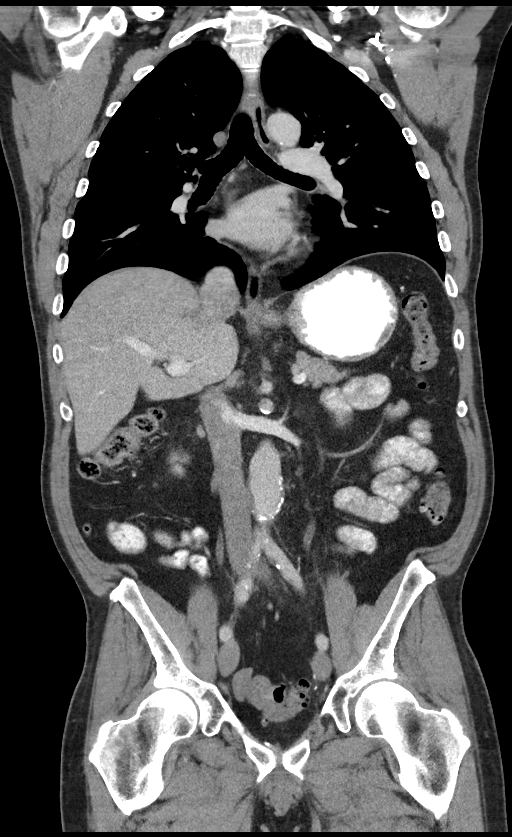
[im 49/98  bone]
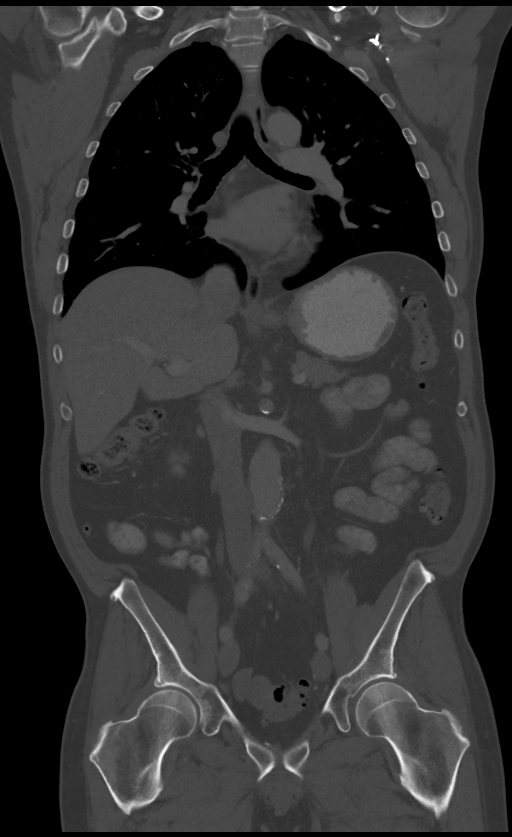
[im 73/98  soft-tissue]
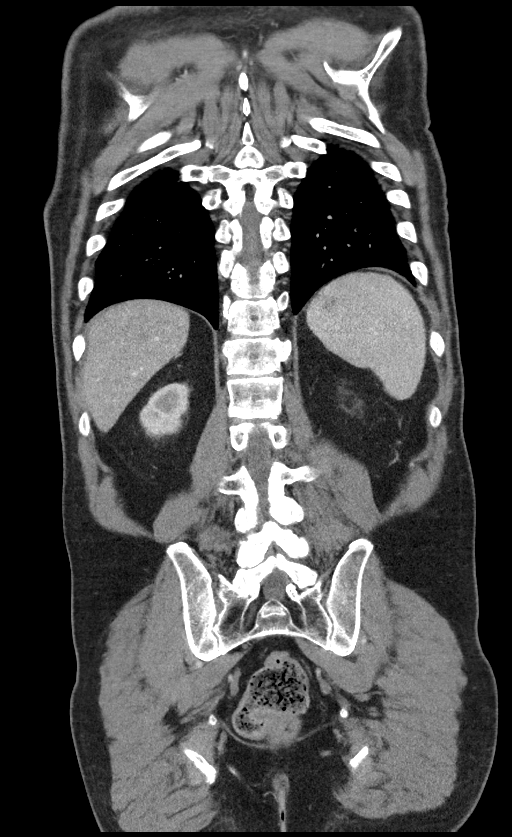

[8 of 46 positions shown; findings below may reference images not displayed]

RADIATION DOSE REDUCTION: This exam was performed according to the
departmental dose-optimization program which includes automated
exposure control, adjustment of the mA and/or kV according to
patient size and/or use of iterative reconstruction technique.

CONTRAST:  80mL OMNIPAQUE IOHEXOL 300 MG/ML  SOLN
FINDINGS: CT CHEST FINDINGS

Cardiovascular: Right IJ Port-A-Cath terminates in the low SVC.
Atherosclerotic calcification of the aorta and coronary arteries.
Locules of air in the pulmonary outflow tract are presumably related
to contrast injection. Heart size normal. No pericardial effusion.

Mediastinum/Nodes: No pathologically enlarged mediastinal, hilar or
axillary lymph nodes. Esophagus is unremarkable.

Lungs/Pleura: Centrilobular and paraseptal emphysema. Minimal
dependent volume loss in the lung bases. Subpleural scarring in the
anterior left lung. No pleural fluid. Airway is unremarkable.

Musculoskeletal: No worrisome lytic or sclerotic lesions.

CT ABDOMEN PELVIS FINDINGS

Hepatobiliary: Liver is unremarkable. Stone in the gallbladder. No
biliary ductal dilatation.

Pancreas: Negative.

Spleen: Negative.

Adrenals/Urinary Tract: Adrenal glands are unremarkable. 1.8 cm
low-density lesion in the interpolar right kidney is indicative of a
cyst. Other low-attenuation lesions in the kidneys are too small to
characterize. Scarring in the left kidney. Distal ureters are poorly
opacified, left greater than right, limiting evaluation. Otherwise,
no filling defects in the opacified portions of the intrarenal
collecting systems or ureters. Cystectomy with a right lower
quadrant ileal conduit.

Stomach/Bowel: Stomach is unremarkable. Ileal resection. Small
bowel, appendix and colon are otherwise unremarkable.

Vascular/Lymphatic: Atherosclerotic calcification of the aorta.
Infrarenal aorta measures up to 4.0 cm, as before. Circumaortic left
renal vein. Right external iliac lymph node measures 12 mm (2/109),
enlarged from 3 mm on 09/13/2021.

Reproductive: Prostatectomy.

Other: No free fluid. Laxity of the midline ventral abdominal wall
contains a knuckle of unobstructed small bowel. Midline
supraumbilical hernia contains fat. Mesenteries and peritoneum are
otherwise unremarkable.

Musculoskeletal: Degenerative changes in the spine. No worrisome
lytic or sclerotic lesions.
IMPRESSION: 1. Enlarged right external iliac lymph node, highly worrisome for
disease recurrence.
2. Cholelithiasis.
3. Infrarenal aortic aneurysm. Recommend follow-up every 12 months
and vascular consultation. This recommendation follows ACR consensus
guidelines: White Paper of the ACR Incidental Findings Committee II
on Vascular Findings. [HOSPITAL] 5988; [DATE].
4. Aortic atherosclerosis (XV3D1-BU8.8). Coronary artery
calcification.
5.  Emphysema ([id number].T).
